# Patient Record
Sex: Male | Born: 1935 | Race: White | Hispanic: No | Marital: Single
Health system: Midwestern US, Community
[De-identification: ages and names within clinical notes are randomized; demographics above are authoritative.]

## PROBLEM LIST (undated history)

## (undated) DIAGNOSIS — G4730 Sleep apnea, unspecified: Secondary | ICD-10-CM

## (undated) DIAGNOSIS — M109 Gout, unspecified: Secondary | ICD-10-CM

## (undated) DIAGNOSIS — I10 Essential (primary) hypertension: Secondary | ICD-10-CM

## (undated) DIAGNOSIS — Z87891 Personal history of nicotine dependence: Secondary | ICD-10-CM

## (undated) DIAGNOSIS — S060X9A Concussion with loss of consciousness of unspecified duration, initial encounter: Secondary | ICD-10-CM

## (undated) DIAGNOSIS — F32A Depression, unspecified: Secondary | ICD-10-CM

## (undated) DIAGNOSIS — F329 Major depressive disorder, single episode, unspecified: Secondary | ICD-10-CM

## (undated) DIAGNOSIS — I2510 Atherosclerotic heart disease of native coronary artery without angina pectoris: Secondary | ICD-10-CM

## (undated) DIAGNOSIS — N400 Enlarged prostate without lower urinary tract symptoms: Secondary | ICD-10-CM

## (undated) DIAGNOSIS — H9190 Unspecified hearing loss, unspecified ear: Secondary | ICD-10-CM

## (undated) DIAGNOSIS — M419 Scoliosis, unspecified: Secondary | ICD-10-CM

## (undated) DIAGNOSIS — J449 Chronic obstructive pulmonary disease, unspecified: Secondary | ICD-10-CM

## (undated) DIAGNOSIS — I38 Endocarditis, valve unspecified: Secondary | ICD-10-CM

## (undated) DIAGNOSIS — M810 Age-related osteoporosis without current pathological fracture: Secondary | ICD-10-CM

## (undated) DIAGNOSIS — G459 Transient cerebral ischemic attack, unspecified: Secondary | ICD-10-CM

## (undated) DIAGNOSIS — M549 Dorsalgia, unspecified: Secondary | ICD-10-CM

## (undated) DIAGNOSIS — M4800 Spinal stenosis, site unspecified: Secondary | ICD-10-CM

## (undated) DIAGNOSIS — G8929 Other chronic pain: Secondary | ICD-10-CM

## (undated) DIAGNOSIS — W19XXXA Unspecified fall, initial encounter: Secondary | ICD-10-CM

## (undated) DIAGNOSIS — H3400 Transient retinal artery occlusion, unspecified eye: Secondary | ICD-10-CM

## (undated) DIAGNOSIS — J90 Pleural effusion, not elsewhere classified: Principal | ICD-10-CM

## (undated) DIAGNOSIS — C8330 Diffuse large B-cell lymphoma, unspecified site: Secondary | ICD-10-CM

## (undated) HISTORY — DX: Endocarditis, valve unspecified: I38

## (undated) HISTORY — DX: Age-related osteoporosis without current pathological fracture: M81.0

## (undated) HISTORY — DX: Unspecified hearing loss, unspecified ear: H91.90

## (undated) HISTORY — DX: Essential (primary) hypertension: I10

## (undated) HISTORY — DX: Sleep apnea, unspecified: G47.30

## (undated) HISTORY — DX: Gout, unspecified: M10.9

## (undated) HISTORY — PX: HERNIA REPAIR: SHX51

## (undated) HISTORY — DX: Transient cerebral ischemic attack, unspecified: G45.9

## (undated) HISTORY — DX: Atherosclerotic heart disease of native coronary artery without angina pectoris: I25.10

## (undated) HISTORY — DX: Spinal stenosis, site unspecified: M48.00

## (undated) HISTORY — DX: Chronic obstructive pulmonary disease, unspecified: J44.9

## (undated) HISTORY — DX: Benign prostatic hyperplasia without lower urinary tract symptoms: N40.0

## (undated) HISTORY — DX: Major depressive disorder, single episode, unspecified: F32.9

## (undated) HISTORY — DX: Concussion with loss of consciousness of unspecified duration, initial encounter: S06.0X9A

## (undated) HISTORY — DX: Depression, unspecified: F32.A

## (undated) HISTORY — DX: Unspecified fall, initial encounter: W19.XXXA

## (undated) HISTORY — DX: Scoliosis, unspecified: M41.9

## (undated) HISTORY — PX: REMOVE AND REPLACE LENS: SHX6308

## (undated) HISTORY — DX: Other chronic pain: G89.29

## (undated) HISTORY — DX: Personal history of nicotine dependence: Z87.891

## (undated) HISTORY — DX: Dorsalgia, unspecified: M54.9

## (undated) MED ORDER — CLOPIDOGREL 75 MG TAB
75 mg | ORAL_TABLET | Freq: Every day | ORAL | Status: DC
Start: ? — End: 2014-03-09

## (undated) MED ORDER — CLOPIDOGREL 75 MG TAB
75 mg | ORAL_TABLET | Freq: Every day | ORAL | Status: DC
Start: ? — End: 2012-05-09

---

## 2004-05-23 HISTORY — PX: PERCUTANEOUS CORONARY STENT INTERVENTION (PCI-S): SHX6016

## 2007-05-24 HISTORY — PX: OTHER SURGICAL HISTORY: SHX169

## 2007-05-24 HISTORY — PX: LAMINECTOMY: SHX219

## 2007-12-18 LAB — URINALYSIS W/ REFLEX CULTURE
Bacteria: NEGATIVE /HPF
Bilirubin: NEGATIVE
Glucose: NEGATIVE MG/DL
Ketone: NEGATIVE MG/DL
Nitrites: NEGATIVE
Protein: NEGATIVE MG/DL
Specific gravity: 1.005 (ref 1.003–1.030)
Urobilinogen: 0.2 EU/DL (ref 0.2–1.0)
pH (UA): 8 (ref 5.0–8.0)

## 2007-12-19 LAB — CULTURE, URINE
Colonies Counted: 80000
Colony Count: 80000

## 2011-05-24 DIAGNOSIS — G459 Transient cerebral ischemic attack, unspecified: Secondary | ICD-10-CM

## 2011-05-24 HISTORY — DX: Transient cerebral ischemic attack, unspecified: G45.9

## 2012-02-13 ENCOUNTER — Inpatient Hospital Stay
Admit: 2012-02-13 | Discharge: 2012-02-15 | Disposition: A | Discharge status: Home / Self Care | Payer: MEDICARE | Attending: Internal Medicine | Admitting: Internal Medicine

## 2012-02-13 DIAGNOSIS — G459 Transient cerebral ischemic attack, unspecified: Secondary | ICD-10-CM

## 2012-02-13 LAB — PROTHROMBIN TIME + INR
INR: 1 (ref 0.9–1.1)
Prothrombin time: 10.7 s (ref 9.4–11.7)

## 2012-02-13 LAB — METABOLIC PANEL, COMPREHENSIVE
A-G Ratio: 1.1 (ref 1.1–2.2)
ALT (SGPT): 37 U/L (ref 12–78)
AST (SGOT): 34 U/L (ref 15–37)
Albumin: 3.9 g/dL (ref 3.5–5.0)
Alk. phosphatase: 89 U/L (ref 50–136)
Anion gap: 8 mmol/L (ref 5–15)
BUN/Creatinine ratio: 18 (ref 12–20)
BUN: 18 MG/DL (ref 6–20)
Bilirubin, total: 0.4 MG/DL (ref 0.2–1.0)
CO2: 28 MMOL/L (ref 21–32)
Calcium: 9.4 MG/DL (ref 8.5–10.1)
Chloride: 106 MMOL/L (ref 97–108)
Creatinine: 1.02 MG/DL (ref 0.45–1.15)
GFR est AA: >60 mL/min/{1.73_m2} (ref >60)
GFR est non-AA: >60 mL/min/{1.73_m2} (ref >60)
Globulin: 3.7 g/dL (ref 2.0–4.0)
Glucose: 102 MG/DL — ABNORMAL HIGH (ref 65–100)
Potassium: 4.4 MMOL/L (ref 3.5–5.1)
Protein, total: 7.6 g/dL (ref 6.4–8.2)
Sodium: 142 MMOL/L (ref 136–145)

## 2012-02-13 LAB — CBC WITH AUTOMATED DIFF
ABS. BASOPHILS: 0.1 10*3/uL (ref 0.0–0.1)
ABS. EOSINOPHILS: 0.3 10*3/uL (ref 0.0–0.4)
ABS. LYMPHOCYTES: 2.2 10*3/uL
ABS. MONOCYTES: 0.6 10*3/uL (ref 0.0–1.0)
ABS. NEUTROPHILS: 3.6 10*3/uL (ref 1.8–8.0)
BASOPHILS: 1 % (ref 0–1)
EOSINOPHILS: 4 % (ref 0–7)
HCT: 34 % — ABNORMAL LOW (ref 36.6–50.3)
HGB: 11.8 g/dL — ABNORMAL LOW (ref 12.1–17.0)
LYMPHOCYTES: 33 % (ref 12–49)
MCH: 32.5 PG (ref 26.0–34.0)
MCHC: 34.7 g/dL (ref 30.0–36.5)
MCV: 93.7 FL (ref 80.0–99.0)
MONOCYTES: 9 % (ref 5–13)
NEUTROPHILS: 53 % (ref 32–75)
PLATELET: 119 10*3/uL — ABNORMAL LOW (ref 150–400)
RBC: 3.63 M/uL — ABNORMAL LOW (ref 4.10–5.70)
RDW: 13.9 % (ref 11.5–14.5)
WBC: 6.8 10*3/uL (ref 4.1–11.1)

## 2012-02-13 LAB — EKG, 12 LEAD, INITIAL
Atrial Rate: 52 {beats}/min
Calculated P Axis: 62 degrees
Calculated R Axis: 59 degrees
Calculated T Axis: 74 degrees
P-R Interval: 174 ms
Q-T Interval: 448 ms
QRS Duration: 104 ms
QTC Calculation (Bezet): 416 ms
Ventricular Rate: 52 {beats}/min

## 2012-02-13 LAB — PTT: aPTT: 25.7 s (ref 23.0–30.0)

## 2012-02-13 MED ADMIN — aspirin (ASPIRIN) tablet 325 mg: ORAL | @ 19:00:00 | NDC 00904200960

## 2012-02-13 MED ADMIN — sodium chloride (NS) flush 5-10 mL: INTRAVENOUS | @ 23:00:00 | NDC 87701099893

## 2012-02-13 MED ADMIN — metoprolol (LOPRESSOR) tablet 6.25 mg: ORAL | @ 23:00:00 | NDC 51079025501

## 2012-02-13 MED FILL — ASPIRIN 325 MG TAB: 325 mg | ORAL | Qty: 1

## 2012-02-13 MED FILL — METOPROLOL TARTRATE 25 MG TAB: 25 mg | ORAL | Qty: 1

## 2012-02-13 NOTE — ED Provider Notes (Signed)
Progress note-patient was seen by mid level and examined by myself. Patient does have a symptoms consistent with amaurosis fugax. His vision has improved but still does have some blurring of his vision. He has a prior history of hypertension and dyslipidemia as well as heart disease. He states he has a history of a leaky valve but no prior heart surgery. He's been to the [place] in the past with Dopplers over 6-7 years ago of his carotid arteries that were normal. He also had an echocardiogram at that time that was normal. Based on his symptoms and risk factors as recommended by on-call neurology Dr. Belia Heman to be admitted to the hospital for for TIA evaluation. Dr. Montez Morita the hospitalist at [facility] has accepted the patient for transfer there. The patient agrees with this plan and transferred to [facility]. [facility].

## 2012-02-13 NOTE — ED Notes (Signed)
Pt to CT via stretcher accompanied by tech.

## 2012-02-13 NOTE — ED Notes (Signed)
Patient being transferred to  room number 336 [facility] . Patient transferred with Medical Transport. Time out completed with Medical Transport confirming correct patient, facility, and room number.

## 2012-02-13 NOTE — Consults (Signed)
Name:       Paul Medina, Paul Medina          Admitted:          02/13/2012                                         DOB:               [birth year]  Account #:  [id number]               Age:               78  Consultant: Meredith Leeds,  Location              MD                                CONSULTATION REPORT    DATE OF CONSULTATION           02/13/2012      HISTORY OF PRESENT ILLNESS: I was asked to see the patient at the [facility] in lieu of what was going to be a transfer to [facility] for a  medical workup for left amaurosis fugax.    The patient is a very pleasant 78 year old right-handed white male. He is a  veteran in good standing with [facility] [employer] where he  gets his care, followed at [facility]. 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 Earlier today around  9:30, he said was reading and happened to independently close each eye. He  discovered that his left eye vision was grayed out completely. His right  eye was baseline normal. He had the peace of mind to call his eye doctor,  Dr. Dorna Bloom, and have his vision checked out as this had never occurred before.  He saw Dr. Dorna Bloom around 11 o'clock and by that time he said his vision was  improving rather strongly. Interestingly, he may have noticed this while  his eyes were dilated. At the time, he had no complaints of headache or eye  pain and there was no report at any time of double vision or focal  neurological deficits such as weakness, numbness, balance issues, change of  mentation, involuntary movements, etc. He was referred from Dr. Haskell Riling office  apparently on to the [facility] ER. I do not have any of the notes from  Dr. Haskell Riling encounter regarding positives or pertinent negatives. I have  suspicion that the diagnosis was amaurosis fugax.    PAST MEDICAL HISTORY  1. The patient takes background aspirin 81 mg a day.  2.  Lopressor.  3. Zocor.  4. Hytrin.  5. Valsartan.    ALLERGIES INCLUDE  1. SULFA.  2. DOXYCYCLINE.  3. DARVOCET.    His head CT at the [facility] was reportedly normal.    He is an occasional imbiber of alcohol, but he quit smoking some 10 or 15  years ago. His blood work at the [facility] showed a normal CMP with a  random glucose of 102. CBC normal except for slightly depressed hemoglobin  and hematocrit of 11.8 and 34 respectively, platelets slightly depressed at  119,000.    He says that he had a remote carotid Doppler done at the [place]  for uncertain  reasons that was reportedly unrevealing.    REVIEW OF SYSTEMS: Per history of present illness.    FAMILY HISTORY: Positive for hypertension, organic heart disease,  cholesterol and diabetes.    PHYSICAL EXAMINATION  GENERAL: His current general physical exam, he is alert, he is cooperative,  he is fluent, articulate, behaving appropriately, following 3-step  commands, oriented x4.  VITAL SIGNS: Temperature 97.8, pulse 56, slightly depressed, blood pressure  167/71, respirations 16 and unlabored, pulse oximetry 99% on room air.  HEENT: Normocephalic, atraumatic, without bruits. Ears, nose and throat  were clear.  NECK: Reasonably supple, no lymphadenopathy, equal carotid upstroke,  nontender, no bruits. Range of motion complete.  CHEST: Clear. No rales or wheeze.  CARDIOVASCULAR: Currently regular rate and rhythm without gallops, murmurs,  or rubs. Pulses 2+ and full.  ABDOMEN: Flat, nontender. Active bowel sounds.  EXTREMITIES: Full range of motion. No cyanosis, clubbing, edema, rash, or  birthmarks. No involuntary movements seen.  NEUROLOGIC: Cranial nerves 2-12 with funduscopic normal under nondilated  conditions. Pupils were slightly anisocoric, the right pupil was about 4 mm  and the left pupil was about 2.5, again afferent pupillary defect negative.  Extraocular motility intact without nystagmus. Lid fissures symmetrically  apposed. External appearance  normal. Visual acuity near in the emergency  room with a near lens was 20/25 in the right eye and 20/20 in the left eye.  Visual fields to confrontation grossly full. Facial sensibility and  expression intact. Oral exam unrevealing. Normal motor mechanics of tongue  and palate.  CEREBELLAR TESTING: Finger-nose-finger, toe-to-finger, and heel-to-shin  intact. Motor 5/5. Sensory intact to touch, temperature and vibratory.  Reflexes were +2. The toes were downgoing, no clonus, no Hoffmann. Gait,  etc, deferred.    IMPRESSION: Left amaurosis fugax.    At this point, agree with admission, need to look at his vascular risk  factors. If he has any issues, the most likely source of emboli to the left  retinal circulation would be the ipsilateral carotid artery, less likely  the heart and less likely the aortic arch. Would ask him to continue on the  aspirin and his treatment of the blood pressure and cholesterol. Suggest  checking a sedimentation rate out of principle, although I do not think  this is temporal arteritis. Recommend carotid Doppler and a 2D echo has  already been ordered. He has sequential compression devices in place. He  has O2 flowing at 2 L/minute. Will also suggest neuro checks. While he is  here, would recommend an MRA of the intracranial circulation. In the big  scheme of things, I also do not think this is nonarteritic ischemic optic  neuropathy, for what it is worth. No current detail by history or exam for carotid artery dissection. No logical migraine story here either. Optic neuritis doesn't behave this way. Dr. Dorna Bloom apparently ruled out intrinsic eye concerns such as retinal detachment etc... Fortunately, he was able to make a concise history of monocular involvement instead of the question of whether it was binocular, so this definitely focuses and fuses our attention on the  anterior visual pathway, especially the carotid and retinal circulations.  Keep vascular risk factors under best control  and will make any further  recommendations if the testing suggest a different lead.        Reviewed on 02/14/2012 5:07 AM                Meredith Leeds, MD  cc:                       Meredith Leeds, MD      JJH/wmx; D: 02/13/2012 06:50 P; T: 02/13/2012 08:32 P; DOC# [id number]; Job#  [id number]

## 2012-02-13 NOTE — ED Notes (Signed)
Dr Peri Maris in with pt now.

## 2012-02-13 NOTE — Progress Notes (Signed)
Bedside and Verbal shift change report given to [facility] RN (oncoming nurse) by Ellery Plunk RN (offgoing nurse).  Report given with SBAR, Kardex, Intake/Output, MAR and Recent Results.

## 2012-02-13 NOTE — ED Notes (Signed)
The pt's  state that he is not quite sure what he is allergic to some type of pain medication and the meds listed as  he is not sure exactly what the reactions are,  He states that he has never had an anaphylactic reaction, just nausea and vomiting.

## 2012-02-13 NOTE — H&P (Signed)
[facility]  [address] [place], [place]  [address]  [id number]    Admission History and Physical      NAME:  Paul Medina   DOB:   1930-08-29   MRN:  [id number]     PCP:  Robinette Haines, MD     Date/Time:  02/13/2012         Subjective:     CHIEF COMPLAINT: L vision loss    HISTORY OF PRESENT ILLNESS:     [name] is a 78 y.o.  male who is admitted with TIA.  Mr. Mccarville presented to the Emergency Department today complaining of losing 98% of the vision in his L eye.  He stated that he could only see shadows with the L eye but the R eye remained normal.  At this time his vision has almost completely returned in the L eye.  He denies any HA or jaw pain.  He denies slurred speech, facial droop or unilateral weakness.      Past Medical History   Diagnosis Date   ??? Hypertension    ??? Hypercholesterolemia    ??? Carpal tunnel syndrome         Past Surgical History   Procedure Date   ??? Hx coronary stent placement    ??? Hx orthopaedic      back       History   Substance Use Topics   ??? Smoking status: Former Smoker -- 1.0 packs/day for 30 years   ??? Smokeless tobacco: Former Neurosurgeon     Quit date: 02/12/2001   ??? Alcohol Use: Yes      rare        Family History   Problem Relation Age of Onset   ??? Heart Attack Father    ??? Heart Attack Mother    ??? Cancer Mother    ??? Cancer Sister    ??? Stroke Brother         Allergies   Allergen Reactions   ??? Darvocet A500 (Propoxyphene N-Acetaminophen) Other (comments)     unknown   ??? Doxycycline Nausea and Vomiting   ??? Sulfa (Sulfonamide Antibiotics) Nausea and Vomiting        Prior to Admission medications    Medication Sig Start Date End Date Taking? Authorizing Provider   simvastatin (ZOCOR) 40 mg tablet Take  by mouth nightly.   Yes Phys Other, MD   valsartan-hydrochlorothiazide (DIOVAN-HCT) 160-25 mg per tablet Take 1 Tab by mouth daily.   Yes Phys Other, MD   terazosin (HYTRIN) 10 mg capsule Take 10 mg by mouth nightly.   Yes Phys Other, MD    aspirin 81 mg chewable tablet Take 81 mg by mouth nightly.   Yes Phys Other, MD   metoprolol (LOPRESSOR) 25 mg tablet Take 6.25 mg by mouth two (2) times a day.   Yes Historical Provider   Omega-3-DHA-EPA-Fish Oil 1,000 (120-180) mg cap Take 1,200 mg by mouth.   Yes Historical Provider   ascorbic acid (VITAMIN C) 500 mg tablet Take 500 mg by mouth.   Yes Historical Provider         Review of Systems:  (bold if positive, if negative)    Gen:  Eyes:  Visual changes, vision loss in L eye which has almost resolved,ENT:  CVS:  Pulm:  GI:    GU:    MS:  Skin:  Psych:  Endo:    Hem:  Renal:  Neuro:            Objective:      VITALS:    Vital signs reviewed; most recent are:    Visit Vitals   Item Reading   ??? BP 167/71   ??? Pulse 56   ??? Temp 97.8 ??F (36.6 ??C)   ??? Resp 16   ??? SpO2 99%     SpO2 Readings from Last 6 Encounters:   02/13/12 99%        No intake or output data in the 24 hours ending 02/13/12 2004         Exam:     Physical Exam:    Gen:  Well-developed, well-nourished, in no acute distress  HEENT:  Pink conjunctivae, PERRL, hearing intact to voice, moist mucous membranes  Neck:  Supple, without masses, thyroid non-tender  Resp:  No accessory muscle use, clear breath sounds without wheezes rales or rhonchi  Card:  No murmurs, normal S1, S2 without thrills, bruits or peripheral edema  Abd:  Soft, non-tender, non-distended, normoactive bowel sounds are present, no palpable organomegaly  Lymph:  No cervical adenopathy  Musc:  No cyanosis or clubbing  Skin:  No rashes or ulcers, skin turgor is good  Neuro:  Cranial nerves 3-12 are intact, grip strength is 5/5 bilaterally, dorsi / plantarflexion strength is 5/5 bilaterally, follows commands appropriately  Psych:  Alert with good insight.  Oriented to person, place, and time       Labs:    Recent Labs   [facility] 02/13/12 1345    WBC 6.8    HGB 11.8*    HCT 34.0*    PLT 119*     Recent Labs   Basename 02/13/12 1345    NA 142    K 4.4    CL 106    CO2 28    GLU 102*     BUN 18    CREA 1.02    CA 9.4    MG --    PHOS --    ALB 3.9    TBIL 0.4    SGOT 34     No results found for this basename: glpoc     No results found for this basename: PH:1,PCO2:1,PO2:1,HCO3:1,FIO2:1 in the last 72 hours  Recent Labs   [facility] 02/13/12 1345    INR 1.0       EKG reviewed:   Sinus bradycardia  CT head:IMPRESSION:  NORMAL STUDY.          Assessment/Plan:       Principal Problem:   *TIA (transient ischemic attack) (02/13/2012): not likely temporal arteritis as pt has no HA or jaw pain.  Pt with multiple risk factors for stroke:  HTN, HLD, family history (brother)  -Head CT neg   -MRI and dopplers pending  -Echo in AM  Neurology consulted      Active Problems:     HTN (hypertension) (02/13/2012)  Pt is currently taking a very small dose of BB and is still bradycardic.  Will DC metoprolol.  Cont Diovan HCT  Unsure if pt has had CVA, so will permit some elevated BP until MRI rules this out.    Will use Hydralazine IV prn for SBP>220/110     HLD (hyperlipidemia) (02/13/2012): cont statin     Coronary atherosclerosis of native coronary artery (02/13/2012): cont ASA  -hold BB for bradycardia  -cont statin     OSA (obstructive sleep apnea) (02/13/2012)  Surrogate decision maker: Paul, Medina    Total time spent with patient: 58 Minutes                  Care Plan discussed with: Patient and Nursing Staff    Discussed:  Care Plan and D/C Planning    Prophylaxis:  Lovenox    Probable Disposition:  Home w/Family           ___________________________________________________    Attending Physician: Elyse Jarvis, DO

## 2012-02-13 NOTE — Progress Notes (Signed)
TRANSFER - IN REPORT:    Verbal report received from Tempie Donning RN(name) on Khalel Eliopoulos  being received from [place] center(unit) for routine progression of care      Report consisted of patient???s Situation, Background, Assessment and   Recommendations(SBAR).     Information from the following report(s) SBAR, Kardex, Procedure Summary, Intake/Output, MAR and Recent Results was reviewed with the receiving nurse.    Opportunity for questions and clarification was provided.      Assessment completed upon patient???s arrival to unit and care assumed.

## 2012-02-13 NOTE — Consults (Signed)
Please see full H&P

## 2012-02-13 NOTE — ED Provider Notes (Signed)
HPI Comments: 9:30 am notes L sided blindness, sudden, no pain associated.  No problems speaking or any focal weakness.  He drove himself to his opthomologist (Dr. Dorna Bloom) who evaluated him and then sent him to the ED as he thought that this was a "minny stroke".  His vision is now improving.      Patient denies prior symptoms, he is otherwise well.  He notes that his vision is nearly equal now but remains a bit "fuzzy".         Patient is a 78 y.o. male presenting with TIA.   TIA  Pertinent negatives include no speech difficulty. Pertinent negatives include no headaches.        Past Medical History   Diagnosis Date   ??? Hypertension    ??? Hypercholesterolemia    ??? Carpal tunnel syndrome         Past Surgical History   Procedure Date   ??? Hx coronary stent placement    ??? Hx orthopaedic      back         History reviewed. No pertinent family history.     History     Social History   ??? Marital Status: SINGLE     Spouse Name: N/A     Number of Children: N/A   ??? Years of Education: N/A     Occupational History   ??? Not on file.     Social History Main Topics   ??? Smoking status: Current Some Day Smoker   ??? Smokeless tobacco: Not on file   ??? Alcohol Use: Yes   ??? Drug Use:    ??? Sexually Active:      Other Topics Concern   ??? Not on file     Social History Narrative   ??? No narrative on file                  ALLERGIES: Darvocet a500; Doxycycline; and Sulfa (sulfonamide antibiotics)      Review of Systems   Constitutional: Negative for fever and chills.   Eyes: Positive for visual disturbance. Negative for pain and redness.   Respiratory: Negative.    Cardiovascular: Negative.    Gastrointestinal: Negative.    Neurological: Negative for dizziness, facial asymmetry, speech difficulty, weakness and headaches.   All other systems reviewed and are negative.        There were no vitals filed for this visit.         Physical Exam   Nursing note and vitals reviewed.  Constitutional: He is oriented to person, place, and time. He appears  well-developed and well-nourished.   HENT:   Head: Normocephalic and atraumatic.   Eyes: EOM are normal.   Fundoscopic exam:       The right eye shows no papilledema. The right eye shows red reflex.       The left eye shows no papilledema. The left eye shows red reflex.       Bilateral pupils dilated equal bilateral 5mm following dilation in opthomology office.   Neck: Normal range of motion.   Cardiovascular: Normal rate, regular rhythm and normal heart sounds.    Pulmonary/Chest: Effort normal and breath sounds normal.   Abdominal: Soft. There is no tenderness.   Musculoskeletal:        5/5 strength bilateral UE/LE       Neurological: He is alert and oriented to person, place, and time. He has normal strength and normal reflexes. No sensory deficit.  He displays a negative Romberg sign. Coordination normal.        No pronator drift         MDM     Differential Diagnosis; Clinical Impression; Plan:     78 y/o M with symptoms suggestive amaurosis fugax, vision now improving.  Discussed case with neurology Belia Heman), recommended admission for workup.  VA 20/40 ou.  No focal weakness or speech disturbance.  During course in ED his vision returned to baseline.      ASA 325mg  po.        Amount and/or Complexity of Data Reviewed:   Clinical lab tests:  Ordered and reviewed  Tests in the radiology section of CPT??:  Ordered and reviewed   Obtain history from someone other than the patient:  Yes   Discuss the patient with another provider:  Yes   Independant visualization of image, tracing, or specimen:  Yes      Procedures

## 2012-02-13 NOTE — Other (Signed)
TRANSFER - OUT REPORT:    Verbal report given to [name] RN(name) on Paul Medina  being transferred to [facility] 336(unit) for routine progression of care       Report consisted of patient???s Situation, Background, Assessment and   Recommendations(SBAR).     Information from the following report(s) SBAR was reviewed with the receiving nurse.    Opportunity for questions and clarification was provided.

## 2012-02-13 NOTE — ED Notes (Signed)
Pt states that this am he had sudden onset of grayed vision in the left eye,  Sent by Dr. Dorna Bloom from [facility] for evaluation of possible TIA.  The pt's states that his vision has recovered in the last 30-45 minutes,  The eye was dilated at [facility].

## 2012-02-13 NOTE — Progress Notes (Signed)
Stroke Education provided to patient and the following topics were discussed    1. Patients personal risk factors for stroke are hypertension, family history and sleep apnea screen    2. Warning signs of Stroke:        * Sudden numbness or weakness of the face, arm or leg, especially on one side of          The body            * Sudden confusion, trouble speaking or understanding        * Sudden trouble seeing in one or both eyes        * Sudden trouble walking, dizziness, loss of balance or coordination        * Sudden severe headache with no known cause      3. Importance of activation Emergency Medical Services ( 9-1-1 ) immediately if experience any warning signs of stroke.    4. Be sure and schedule a follow-up appointment with your primary care doctor or any specialists as instructed.     5. You must take medicine every day to treat your risk factors for stroke.  Be sure to take your medicines exactly as your doctor tells you: no more, no less.  Know what your medicines are for , what they do.  Anti-thrombotics /anticoagulants can help prevent strokes.  You are taking the following medicine(s)  Aspirin      6.  Smoking and second-hand smoke greatly increase your risk of stroke, cardiovascular disease and death. Smoking history ended year about 2003     7. Information provided was [employer] banker, operational Education    8. Documentation of teaching completed in Patient Education Activity and on Care Plan with teaching response noted?  yes

## 2012-02-14 LAB — LIPID PANEL
CHOL/HDL Ratio: 2.1 (ref 0–5.0)
Cholesterol, total: 108 MG/DL (ref <200)
HDL Cholesterol: 51 MG/DL
LDL, calculated: 47.8 MG/DL (ref 0–100)
Triglyceride: 46 MG/DL (ref <150)
VLDL, calculated: 9.2 MG/DL

## 2012-02-14 LAB — CBC WITH AUTOMATED DIFF
ABS. BASOPHILS: 0 10*3/uL (ref 0.0–0.1)
ABS. EOSINOPHILS: 0.3 10*3/uL (ref 0.0–0.4)
ABS. LYMPHOCYTES: 2 10*3/uL (ref 0.8–3.5)
ABS. MONOCYTES: 0.4 10*3/uL (ref 0.0–1.0)
ABS. NEUTROPHILS: 3 10*3/uL (ref 1.8–8.0)
BASOPHILS: 1 % (ref 0–1)
EOSINOPHILS: 4 % (ref 0–7)
HCT: 29.9 % — ABNORMAL LOW (ref 36.6–50.3)
HGB: 10.7 g/dL — ABNORMAL LOW (ref 12.1–17.0)
LYMPHOCYTES: 36 % (ref 12–49)
MCH: 32.8 PG (ref 26.0–34.0)
MCHC: 35.8 g/dL (ref 30.0–36.5)
MCV: 91.7 FL (ref 80.0–99.0)
MONOCYTES: 7 % (ref 5–13)
NEUTROPHILS: 52 % (ref 32–75)
PLATELET: 106 10*3/uL — ABNORMAL LOW (ref 150–400)
RBC: 3.26 M/uL — ABNORMAL LOW (ref 4.10–5.70)
RDW: 14.3 % (ref 11.5–14.5)
WBC: 5.8 10*3/uL (ref 4.1–11.1)

## 2012-02-14 LAB — METABOLIC PANEL, COMPREHENSIVE
A-G Ratio: 1 — ABNORMAL LOW (ref 1.1–2.2)
ALT (SGPT): 23 U/L (ref 12–78)
AST (SGOT): 24 U/L (ref 15–37)
Albumin: 3 g/dL — ABNORMAL LOW (ref 3.5–5.0)
Alk. phosphatase: 74 U/L (ref 50–136)
Anion gap: 9 mmol/L (ref 5–15)
BUN/Creatinine ratio: 17 (ref 12–20)
BUN: 19 MG/DL (ref 6–20)
Bilirubin, total: 0.3 MG/DL (ref 0.2–1.0)
CO2: 29 MMOL/L (ref 21–32)
Calcium: 9 MG/DL (ref 8.5–10.1)
Chloride: 109 MMOL/L — ABNORMAL HIGH (ref 97–108)
Creatinine: 1.13 MG/DL (ref 0.45–1.15)
GFR est AA: >60 mL/min/{1.73_m2} (ref >60)
GFR est non-AA: >60 mL/min/{1.73_m2} (ref >60)
Globulin: 2.9 g/dL (ref 2.0–4.0)
Glucose: 117 MG/DL — ABNORMAL HIGH (ref 65–100)
Potassium: 4.3 MMOL/L (ref 3.5–5.1)
Protein, total: 5.9 g/dL — ABNORMAL LOW (ref 6.4–8.2)
Sodium: 147 MMOL/L — ABNORMAL HIGH (ref 136–145)

## 2012-02-14 LAB — SED RATE (ESR): Sed rate (ESR): 1 MM/HR (ref 0–20)

## 2012-02-14 MED ADMIN — enoxaparin (LOVENOX) injection 40 mg: SUBCUTANEOUS | @ 18:00:00 | NDC 00075062040

## 2012-02-14 MED ADMIN — psyllium (METAMUCIL) packet 1 Packet: ORAL | @ 18:00:00 | NDC 37000002410

## 2012-02-14 MED ADMIN — ascorbic acid (VITAMIN C) tablet 500 mg: ORAL | @ 15:00:00 | NDC 00409339732

## 2012-02-14 MED ADMIN — simvastatin (ZOCOR) tablet 40 mg: ORAL | @ 01:00:00 | NDC 68382006716

## 2012-02-14 MED ADMIN — sodium chloride (NS) flush 5-10 mL: INTRAVENOUS | @ 18:00:00 | NDC 87701099893

## 2012-02-14 MED ADMIN — gadopentetate dimeglumine (MAGNEVIST) 7.5 mmol/15 mL (469.01 mg/mL) contrast solution 15 mL: INTRAVENOUS | @ 02:00:00 | NDC 50419018815

## 2012-02-14 MED ADMIN — sodium chloride (NS) flush 5-10 mL: INTRAVENOUS | @ 01:00:00 | NDC 87701099893

## 2012-02-14 MED ADMIN — sodium chloride (NS) flush 5-10 mL: INTRAVENOUS | @ 10:00:00 | NDC 87701099893

## 2012-02-14 MED ADMIN — terazosin (HYTRIN) capsule 10 mg: ORAL | @ 01:00:00 | NDC 00093433801

## 2012-02-14 MED ADMIN — aspirin chewable tablet 81 mg: ORAL | @ 01:00:00 | NDC 63739043401

## 2012-02-14 MED ADMIN — fish oil-omega-3 fatty acids 340-1,000 mg capsule 1 Cap: ORAL | @ 18:00:00 | NDC 53191040901

## 2012-02-14 MED FILL — PSYLLIUM ORAL PACKET SUGAR FREE: ORAL | Qty: 1

## 2012-02-14 MED FILL — ASPIRIN 81 MG CHEWABLE TAB: 81 mg | ORAL | Qty: 1

## 2012-02-14 MED FILL — MAGNEVIST 7.5 MMOL/15 ML (469.01 MG/ML) INTRAVENOUS SOLUTION: 7.5 mmol/15 mL (469.01 mg/mL) | INTRAVENOUS | Qty: 15

## 2012-02-14 MED FILL — VITAMIN C 500 MG TABLET: 500 mg | ORAL | Qty: 1

## 2012-02-14 MED FILL — SIMVASTATIN 20 MG TAB: 20 mg | ORAL | Qty: 2

## 2012-02-14 MED FILL — TERAZOSIN 5 MG CAP: 5 mg | ORAL | Qty: 2

## 2012-02-14 MED FILL — LOVENOX 40 MG/0.4 ML SUBCUTANEOUS SYRINGE: 40 mg/0.4 mL | SUBCUTANEOUS | Qty: 0.4

## 2012-02-14 MED FILL — FISH OIL 340 MG-1,000 MG CAPSULE: 340-1000 mg | ORAL | Qty: 1

## 2012-02-14 NOTE — Procedures (Signed)
[facility]  *** FINAL REPORT ***    Name: Paul Medina, Paul Medina  MRN: [id number]  DOB: 15-Dec-1930  Visit: [id number]  Date: 13 Feb 2012    TYPE OF TEST: Cerebrovascular Duplex    REASON FOR TEST  Visual field defect, unspecifd, Left Eye    Right Carotid:-             Proximal               Mid                 Distal  cm/s  Systolic  Diastolic  Systolic  Diastolic  Systolic  Diastolic  CCA:    114.0      14.0                            96.8      20.3  Bulb:  ECA:     93.7       6.5  ICA:     60.1      15.3       73.2      17.4       85.2      27.3  ICA/CCA:  0.6       0.8    ICA Stenosis:    Right Vertebral:-  Finding: Antegrade  Sys:       57.0  Dia:       14.0    Right Subclavian:    Left Carotid:-            Proximal                Mid                 Distal  cm/s  Systolic  Diastolic  Systolic  Diastolic  Systolic  Diastolic  CCA:     95.6      12.5                            89.9      17.4  Bulb:  ECA:     98.6       5.0  ICA:     94.9      13.7       59.9      23.7       82.4      29.9  ICA/CCA:  1.1       0.8    ICA Stenosis:    Left Vertebral:-  Finding: Antegrade  Sys:       53.7  Dia:       16.2    Left Subclavian:    INTERPRETATION/FINDINGS  PROCEDURE:  Evaluation of the extracranial cerebrovascular arteries  with ultrasound (B-mode imaging, pulsed Doppler, color Doppler).  Includes the common carotid, internal carotid, external carotid, and  vertebral arteries.    FINDINGS: Calcific plaque in the bulb and right ICA. Homogeneous  plaque in the bulb and left ICA.    IMPRESSION: Findings are consistent with 0-49% stenosis of the right  internal carotid and 0-49% stenosis of the left internal carotid.  Vertebrals are patent with antegrade flow.    ADDITIONAL COMMENTS    I have personally reviewed the data relevant to the interpretation of  this  study.    TECHNOLOGIST:  Valentina Lucks    PHYSICIAN:  Electronically signed by:  Dalene Carrow. Sheliah Hatch, MD  02/15/2012 11:53 AM

## 2012-02-14 NOTE — Progress Notes (Signed)
Bedside and Verbal shift change report given to Kristle RN (oncoming nurse) by Monica RN (offgoing nurse).  Report given with SBAR, Kardex and MAR.

## 2012-02-14 NOTE — Med Student Progress Note (Signed)
*ATTENTION:  This note has been created by a medical student for educational purposes only.  Please do not refer to the content of this note for clinical decision-making, billing, or other purposes.  Please see attending physician???s note to obtain clinical information on this patient.*       CC:  " i feel fine now"      Interval hx:  Pt with transient vision loss admitted for TIA. No events overnight      Subjective:    Pt reporting near complete resolution of L eye vision loss, denies weakness, gait instability or HAs    Current Facility-Administered Medications   Medication Dose Route Frequency   ??? dextrose 5 % - 0.45% NaCl infusion  75 mL/hr IntraVENous CONTINUOUS   ??? aspirin (ASPIRIN) tablet 325 mg  325 mg Oral ONCE   ??? aspirin chewable tablet 81 mg  81 mg Oral QHS   ??? simvastatin (ZOCOR) tablet 40 mg  40 mg Oral QHS   ??? terazosin (HYTRIN) capsule 10 mg  10 mg Oral QHS   ??? valsartan/hydrochlorothiazide (DIOVAN HCT) 160/25 mg   Oral DAILY   ??? ascorbic acid (VITAMIN C) tablet 500 mg  500 mg Oral DAILY WITH BREAKFAST   ??? .PHARMACY TO SUBSTITUTE PER PROTOCOL       ??? sodium chloride (NS) flush 5-10 mL  5-10 mL IntraVENous Q8H   ??? sodium chloride (NS) flush 5-10 mL  5-10 mL IntraVENous PRN   ??? hydrALAZINE (APRESOLINE) 20 mg/mL injection 20 mg  20 mg IntraVENous Q6H PRN   ??? gadopentetate dimeglumine (MAGNEVIST) 7.5 mmol/15 mL (469.01 mg/mL) contrast solution 15 mL  15 mL IntraVENous RAD ONCE         BP 144/50   Pulse 60   Temp 98.4 ??F (36.9 ??C)   Resp 18   SpO2 96%    Physical Examination: General appearance - alert, well appearing, and in no distress and oriented to person, place, and time  Mental status - normal mood, behavior, speech, dress, motor activity, and thought processes  Eyes - pupils equal and reactive, extraocular eye movements intact, left eye normal, right eye normal  Neck - supple, no significant adenopathy, carotids upstroke normal bilaterally, no bruits  Chest - clear to auscultation, no wheezes,  rales or rhonchi, symmetric air entry, no tachypnea, retractions or cyanosis  Heart - normal rate, regular rhythm, normal S1, S2, systolic murmur heard LUSB and 5th intercostal midclavicular  Abdomen - soft, nontender, nondistended, no masses or organomegaly  Neurological - alert, oriented, normal speech, no focal findings or movement disorder noted, neck supple without rigidity, cranial nerves II through XII intact, motor and sensory grossly normal bilaterally, normal muscle tone, no tremors, strength 5/5      Recent Results (from the past 24 hour(s))   EKG, 12 LEAD, INITIAL    Collection Time    02/13/12  1:36 PM       Component Value Range    Ventricular Rate 52      Atrial Rate 52      P-R Interval 174      QRS Duration 104      Q-T Interval 448      QTC Calculation (Bezet) 416      Calculated P Axis 62      Calculated R Axis 59      Calculated T Axis 74      Diagnosis        Value: Sinus bradycardia  Otherwise normal ECG      No previous ECGs available      Confirmed by [id number], [place] MD, Loraine Leriche ([id number]) on 02/13/2012 6:33:26 PM   CBC WITH AUTOMATED DIFF    Collection Time    02/13/12  1:45 PM       Component Value Range    WBC 6.8  4.1 - 11.1 K/uL    RBC 3.63 (*) 4.10 - 5.70 M/uL    HGB 11.8 (*) 12.1 - 17.0 g/dL    HCT 98.1 (*) 19.1 - 50.3 %    MCV 93.7  80.0 - 99.0 FL    MCH 32.5  26.0 - 34.0 PG    MCHC 34.7  30.0 - 36.5 g/dL    RDW 47.8  29.5 - 62.1 %    PLATELET 119 (*) 150 - 400 K/uL    NEUTROPHILS 53  32 - 75 %    LYMPHOCYTES 33  12 - 49 %    MONOCYTES 9  5 - 13 %    EOSINOPHILS 4  0 - 7 %    BASOPHILS 1  0 - 1 %    ABS. NEUTROPHILS 3.6  1.8 - 8.0 K/UL    ABS. LYMPHOCYTES 2.2      ABS. MONOCYTES 0.6  0.0 - 1.0 K/UL    ABS. EOSINOPHILS 0.3  0.0 - 0.4 K/UL    ABS. BASOPHILS 0.1  0.0 - 0.1 K/UL    DF AUTOMATED     METABOLIC PANEL, COMPREHENSIVE    Collection Time    02/13/12  1:45 PM       Component Value Range    Sodium 142  136 - 145 MMOL/L    Potassium 4.4  3.5 - 5.1 MMOL/L    Chloride 106  97 - 108  MMOL/L    CO2 28  21 - 32 MMOL/L    Anion gap 8  5 - 15 mmol/L    Glucose 102 (*) 65 - 100 MG/DL    BUN 18  6 - 20 MG/DL    Creatinine 3.08  6.57 - 1.15 MG/DL    BUN/Creatinine ratio 18  12 - 20      GFR est AA >60  >60 ml/min/1.47m2    GFR est non-AA >60  >60 ml/min/1.69m2    Calcium 9.4  8.5 - 10.1 MG/DL    Bilirubin, total 0.4  0.2 - 1.0 MG/DL    ALT 37  12 - 78 U/L    AST 34  15 - 37 U/L    Alk. phosphatase 89  50 - 136 U/L    Protein, total 7.6  6.4 - 8.2 g/dL    Albumin 3.9  3.5 - 5.0 g/dL    Globulin 3.7  2.0 - 4.0 g/dL    A-G Ratio 1.1  1.1 - 2.2     PTT    Collection Time    02/13/12  1:45 PM       Component Value Range    aPTT 25.7  23.0 - 30.0 sec    aPTT, therapeutic range      58.0 - 77.0 SECS   PROTHROMBIN TIME    Collection Time    02/13/12  1:45 PM       Component Value Range    INR 1.0  0.9 - 1.1      Prothrombin time 10.7  9.4 - 11.7 sec   LIPID PANEL    Collection Time    02/13/12  6:50  PM       Component Value Range    LIPID PROFILE          Cholesterol, total 108  <200 MG/DL    Triglyceride 46  <161 MG/DL    HDL Cholesterol 51      LDL, calculated 47.8  0 - 096 MG/DL    VLDL, calculated 9.2      CHOL/HDL Ratio 2.1  0 - 5.0     METABOLIC PANEL, COMPREHENSIVE    Collection Time    02/14/12  2:50 AM       Component Value Range    Sodium 147 (*) 136 - 145 MMOL/L    Potassium 4.3  3.5 - 5.1 MMOL/L    Chloride 109 (*) 97 - 108 MMOL/L    CO2 29  21 - 32 MMOL/L    Anion gap 9  5 - 15 mmol/L    Glucose 117 (*) 65 - 100 MG/DL    BUN 19  6 - 20 MG/DL    Creatinine 0.45  4.09 - 1.15 MG/DL    BUN/Creatinine ratio 17  12 - 20      GFR est AA >60  >60 ml/min/1.34m2    GFR est non-AA >60  >60 ml/min/1.5m2    Calcium 9.0  8.5 - 10.1 MG/DL    Bilirubin, total 0.3  0.2 - 1.0 MG/DL    ALT 23  12 - 78 U/L    AST 24  15 - 37 U/L    Alk. phosphatase 74  50 - 136 U/L    Protein, total 5.9 (*) 6.4 - 8.2 g/dL    Albumin 3.0 (*) 3.5 - 5.0 g/dL    Globulin 2.9  2.0 - 4.0 g/dL    A-G Ratio 1.0 (*) 1.1 - 2.2     CBC WITH  AUTOMATED DIFF    Collection Time    02/14/12  2:50 AM       Component Value Range    WBC 5.8  4.1 - 11.1 K/uL    RBC 3.26 (*) 4.10 - 5.70 M/uL    HGB 10.7 (*) 12.1 - 17.0 g/dL    HCT 81.1 (*) 91.4 - 50.3 %    MCV 91.7  80.0 - 99.0 FL    MCH 32.8  26.0 - 34.0 PG    MCHC 35.8  30.0 - 36.5 g/dL    RDW 78.2  95.6 - 21.3 %    PLATELET 106 (*) 150 - 400 K/uL    NEUTROPHILS 52  32 - 75 %    LYMPHOCYTES 36  12 - 49 %    MONOCYTES 7  5 - 13 %    EOSINOPHILS 4  0 - 7 %    BASOPHILS 1  0 - 1 %    ABS. NEUTROPHILS 3.0  1.8 - 8.0 K/UL    ABS. LYMPHOCYTES 2.0  0.8 - 3.5 K/UL    ABS. MONOCYTES 0.4  0.0 - 1.0 K/UL    ABS. EOSINOPHILS 0.3  0.0 - 0.4 K/UL    ABS. BASOPHILS 0.0  0.0 - 0.1 K/UL       A/P    Pt is a 78 y.o. WHITE OR CAUCASIAN male h/o HTN who presents with amourosis fugax concerning for TIA    TIA (transient ischemic attack)  Sudden vision loss yesterday. Since has resolved. Neg optho w/u at OSH prior to admission. No weakness or gait instability  -- CT, MRI neg  -- doppler carotid,  TTE pending  -- neuro evaluated, appreciate recs  -- continue statin  -- LDL 47  -- optho eval at OSH negative  -- continue asa  -- no h/o afib, monitor on tele  -- per neuro, holding BB with recent bradycardia, last HR 60s  -- control HTN. SBP 140s    HTN  Stable.  -- holding metoprolol with bradycardia. Currently 60 HR  -- continue home HCTZ, ARB, terazosin    OSA  Previously on CPAP, recently has stopped CPAP and started "mouth piece"  -- continue home mouth piece while inpt

## 2012-02-14 NOTE — Progress Notes (Addendum)
Discharge paper work is complete, pt given opportunity to ask questions and follow up appointment for next week with MD. IV and Tele D/C'ed. Stoke booklet given and patient will be discharged via wheelchair by medical staff. ~ G.Dent RN   2127: computer sign pad not working the room patient signed discharge papers non-electronically. ~ G. Dent RN

## 2012-02-14 NOTE — Progress Notes (Addendum)
[facility]  [address] [place], [place] [address]  [phone]      Medical Progress Note      NAME: Paul Medina   DOB:  07/24/1930  MRM:  [id number]    Date/Time: 02/14/2012         Subjective:     Chief Complaint:  Patient was seen and examined by me.  Chart reviewed.  Denied vision changes, chest pain, SOB       Objective:       Vitals:       Last 24hrs VS reviewed since prior progress note. Most recent are:    Visit Vitals   Item Reading   ??? BP 144/50   ??? Pulse 60   ??? Temp 98.4 ??F (36.9 ??C)   ??? Resp 18   ??? SpO2 96%     SpO2 Readings from Last 6 Encounters:   02/14/12 96%          Intake/Output Summary (Last 24 hours) at 02/14/12 2130  Last data filed at 02/13/12 2100   Gross per 24 hour   Intake    240 ml   Output      0 ml   Net    240 ml        Exam:     Physical Exam:    Gen:  Well-developed, well-nourished, in no acute distress  HEENT:  Pink conjunctivae, PERRL, hearing intact to voice, moist mucous membranes  Neck:  Supple, without masses, thyroid non-tender  Resp:  No accessory muscle use, clear breath sounds without wheezes rales or rhonchi  Card:  No murmurs, normal S1, S2 without thrills, bruits or peripheral edema  Abd:  Soft, non-tender, non-distended, normoactive bowel sounds are present, no palpable organomegaly and no detectable hernias  Musc:  No cyanosis or clubbing  Skin:  No rashes or ulcers, skin turgor is good  Neuro:  Cranial nerves 3-12 are grossly intact, grip strength is 5/5 bilaterally and dorsi / plantarflexion is 5/5 bilaterally, follows commands appropriately  Psych:  Good insight, oriented to person, place and time, alert      Medications Reviewed: (see below)    Lab Data Reviewed: (see below)    ______________________________________________________________________    Medications:     Current Facility-Administered Medications   Medication Dose Route Frequency   ??? dextrose 5 % - 0.45% NaCl infusion  75 mL/hr IntraVENous CONTINUOUS   ??? aspirin  (ASPIRIN) tablet 325 mg  325 mg Oral ONCE   ??? aspirin chewable tablet 81 mg  81 mg Oral QHS   ??? simvastatin (ZOCOR) tablet 40 mg  40 mg Oral QHS   ??? terazosin (HYTRIN) capsule 10 mg  10 mg Oral QHS   ??? valsartan/hydrochlorothiazide (DIOVAN HCT) 160/25 mg   Oral DAILY   ??? ascorbic acid (VITAMIN C) tablet 500 mg  500 mg Oral DAILY WITH BREAKFAST   ??? .PHARMACY TO SUBSTITUTE PER PROTOCOL       ??? sodium chloride (NS) flush 5-10 mL  5-10 mL IntraVENous Q8H   ??? sodium chloride (NS) flush 5-10 mL  5-10 mL IntraVENous PRN   ??? hydrALAZINE (APRESOLINE) 20 mg/mL injection 20 mg  20 mg IntraVENous Q6H PRN   ??? gadopentetate dimeglumine (MAGNEVIST) 7.5 mmol/15 mL (469.01 mg/mL) contrast solution 15 mL  15 mL IntraVENous RAD ONCE          Lab Review:     Recent Labs   Basename 02/14/12 0250 02/13/12  1345    WBC 5.8 6.8    HGB 10.7* 11.8*    HCT 29.9* 34.0*    PLT 106* 119*     Recent Labs   Basename 02/14/12 0250 02/13/12 1345    NA 147* 142    K 4.3 4.4    CL 109* 106    CO2 29 28    GLU 117* 102*    BUN 19 18    CREA 1.13 1.02    CA 9.0 9.4    MG -- --    PHOS -- --    ALB 3.0* 3.9    TBIL 0.3 0.4    SGOT 24 34    INR -- 1.0     No results found for this basename: glpoc          Assessment / Plan:     Principal Problem:    78 yo hx of HTN, hyperlipidemia, presented w/ L amaurosis fugax    1) L amaurosis fugax/TIA (transient ischemic attack): concern for embolic dz.  Hx not consistent with optic neuritis or temporal arteritis.  Already evaluated by Dr. Dorna Bloom (optho).  Head MRI/MRA was unremarkable.  Awaiting carotid dopplers and echo.  Cont ASA, statin.  Neuro following.      2) HTN (hypertension): metoprolol was d/c due to bradycardia.  Cont diovan/HCTZ    3) HLD (hyperlipidemia): cont statin    4) Coronary atherosclerosis of native coronary artery: cont ASA, statin    5) Hypernatremia: likely due to dehydration.  Cont IVF, monitor BMP    Total time spent with patient: 30 Minutes                  Care Plan discussed with: Patient     Discussed:  Care Plan    Prophylaxis:  Lovenox    Disposition:  Home w/Family           ___________________________________________________    Attending Physician: Twanna Hy. Rivaan Kendall, MD

## 2012-02-14 NOTE — Progress Notes (Signed)
Bedside and Verbal shift change report given to [facility],RN (oncoming nurse) by Peggye Form (offgoing nurse).  Report given with SBAR, Kardex, ED Summary, Intake/Output, MAR, Accordion, Recent Results and Med Rec Status.

## 2012-02-14 NOTE — Progress Notes (Signed)
Neurology:    S-doing well w/o complaints with no new features and return of vision OS.     O-98.4-60-18-144/50-96%  VA near with near lens 20/20 OU. VFF. Fundi normal and aphakia OU. EOMI. PERRL APD negative.  Brain MRI and MRA unrevealing. Carotid dopplers prelim 1-49% stenosis bil ICA. Echo pending. ESR pending.     A-Amaurosis fugax OS. Return to normal visual capabilities.     P- Would suggest adding Plavix to ASA for 90 days then drop back to ASA thereafter. He f/u with ophthalmology in December. Keep vascular risk factors under best control. He is followed by the [facility]. Thanks. JJHMD.

## 2012-02-14 NOTE — Progress Notes (Signed)
[facility]: MED RECONCILIATION    Information obtained from: patient    Significant PMH/Disease States:  Patient Active Problem List   Diagnosis Code   ??? TIA (transient ischemic attack) 435.9   ??? HTN (hypertension) 401.9   ??? HLD (hyperlipidemia) 272.4   ??? Amaurosis fugax of left eye 362.34   ??? Coronary atherosclerosis of native coronary artery 414.01   ??? OSA (obstructive sleep apnea) 327.23     Past Medical History   Diagnosis Date   ??? Hypertension    ??? Hypercholesterolemia    ??? Carpal tunnel syndrome      Chief Complaint for this Admission:   Chief Complaint   Patient presents with   ??? TIA       Allergies: Darvocet a500; Doxycycline; and Sulfa (sulfonamide antibiotics)    Prior to Admission Medications:     Prior to Admission Medications   Medication Last Dose Informant Patient Reported? Taking?   terazosin (HYTRIN) 10 mg capsule 02/13/2012 at Unknown  Yes Yes   Take 10 mg by mouth nightly.   aspirin 81 mg chewable tablet 02/13/2012 at Unknown  Yes Yes   Take 81 mg by mouth nightly.   Omega-3-DHA-EPA-Fish Oil 1,000 (120-180) mg cap 02/13/2012 at Unknown  Yes Yes   Take 1,200 mg by mouth.   ascorbic acid (VITAMIN C) 500 mg tablet 02/13/2012 at Unknown  Yes Yes   Take 500 mg by mouth.   simvastatin (ZOCOR) 20 mg tablet   Yes Yes   Take 20 mg by mouth nightly.   valsartan (DIOVAN) 40 mg tablet   Yes Yes   Take 40 mg by mouth two (2) times a day.   niacin ER (NIASPAN) 500 mg tablet   Yes Yes   Take 1,500 mg by mouth nightly. Takes with ASA 81 mg to reduce flushing   multivitamin (ONE A DAY) tablet   Yes Yes   Take 1 Tab by mouth daily.        Over-the-Counter medications (Did not place into med rec list since patient was unsure of doses at this time)    Iron supplement (possible 65 mg elemental) daily  Vit D daily  Vit B12 daily    Cheree Ditto, [address]D.  [phone]

## 2012-02-14 NOTE — Discharge Summary (Signed)
Physician Discharge Summary     Patient ID:  Paul Medina  [id number]  78 y.o.  1930/08/05    PCP: Robinette Haines, MD     Admit date: 02/13/2012    Discharge date and time: 02/14/2012    Admission Diagnoses: TIA  TIA (transient ischemic attack)    Discharge Diagnoses:    Principal Problem:   *TIA (transient ischemic attack) (02/13/2012)  Active Problems:     HTN (hypertension) (02/13/2012)     HLD (hyperlipidemia) (02/13/2012)     Amaurosis fugax of left eye (02/13/2012)     Coronary atherosclerosis of native coronary artery (02/13/2012)     OSA (obstructive sleep apnea) (02/13/2012)       Procedures/ Tests:  CT head: 02/13/12   IMPRESSION:  NORMAL STUDY    MRI brain: 02/13/12  IMPRESSION: No evidence of acute infarct. 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    MRA head:02/13/12  IMPRESSION: Normal intracranial MRA.     Carotid dopplers 02/13/12  IMPRESSION: Findings are consistent with 0-49% stenosis of the right   internal carotid and 0-49% stenosis of the left internal carotid.   Vertebrals are patent with antegrade flow.     Consults: Neuro: suggested starting plavix for 90 days.    Hospital Course/Assessment and Plan:   Principal Problem:   *TIA (transient ischemic attack) (02/13/2012): Paul Medina presented with 98% loss of vision in his L eye.  He stated that he was only able to see shadows initially.  He denied HA or eye pain.  All imaging was negative for any acute process.  Seen by Dr. Peri Maris who suggested that he start plavix and cont to manage risk factors such as HTN, HLD and etoh usage to a minimum. Cardiac diet and exercise.    Active Problems:     HTN (hypertension) (02/13/2012): cont valsartan.  Metoprolol was stopped as even with the 1/4 of a tablet his HR was still in low 50's.  He cannot tolerate this med.     HLD (hyperlipidemia)  (02/13/2012): cont simvastatin   Amaurosis fugax of left eye (02/13/2012)     Coronary atherosclerosis of native coronary artery(02/13/2012): cont ASA, valsartan, cardiac diet     OSA (obstructive sleep apnea) (02/13/2012)        Condition of patient at discharge: good    Discharge Exam:     Visit Vitals   Item Reading   ??? BP 110/52   ??? Pulse 62   ??? Temp 99.1 ??F (37.3 ??C)   ??? Resp 18   ??? SpO2 95%       Gen:  Well-developed, well-nourished, in no acute distress  HEENT:  Pink conjunctivae, PERRL, hearing intact to voice, moist mucous membranes  Neck:  Supple, without masses, thyroid non-tender  Resp:  No accessory muscle use, clear breath sounds without wheezes rales or rhonchi  Card:  No murmurs, normal S1, S2 without thrills, bruits or peripheral edema  Abd:  Soft, non-tender, non-distended, normoactive bowel sounds are present  Musc:  No cyanosis or clubbing  Skin:  No rashes or ulcers, skin turgor is good  Neuro:  Cranial nerves 3-12 are grossly intact, grip strength is 5/5 bilaterally and dorsi / plantarflexion is 5/5 bilaterally, follows commands appropriately  Psych:  Good insight, oriented to person, place and time, alert  Disposition: home    Patient Instructions:   Current Discharge Medication List      START taking these medications    Details   clopidogrel (PLAVIX) 75 mg tablet Take 1 Tab by mouth daily.  Qty: 30 Tab, Refills: 3         CONTINUE these medications which have NOT CHANGED    Details   simvastatin (ZOCOR) 20 mg tablet Take 20 mg by mouth nightly.      valsartan (DIOVAN) 40 mg tablet Take 40 mg by mouth two (2) times a day.      niacin ER (NIASPAN) 500 mg tablet Take 1,500 mg by mouth nightly. Takes with ASA 81 mg to reduce flushing      multivitamin (ONE A DAY) tablet Take 1 Tab by mouth daily.      terazosin (HYTRIN) 10 mg capsule Take 10 mg by mouth nightly.      aspirin 81 mg chewable tablet Take 81 mg by mouth nightly.      Omega-3-DHA-EPA-Fish Oil 1,000 (120-180) mg cap Take 1,200 mg by  mouth.      ascorbic acid (VITAMIN C) 500 mg tablet Take 500 mg by mouth.         STOP taking these medications       valsartan-hydrochlorothiazide (DIOVAN-HCT) 160-25 mg per tablet Comments:   Reason for Stopping:         metoprolol (LOPRESSOR) 25 mg tablet Comments:   Reason for Stopping:             Activity: activity as tolerated  Diet: Cardiac Diet  Wound Care: None needed    Follow-up with Robinette Haines, MD in 7 days.  Follow up with specialists: none      Approximate time spent in patient care on day of discharge: 45    Signed:  Elyse Jarvis, DO  02/14/2012  9:09 PM

## 2012-02-14 NOTE — Progress Notes (Signed)
Carotid duplex completed. Final results to follow.

## 2012-02-14 NOTE — Progress Notes (Signed)
SPEECH THERAPY SCREENING:  SERVICES ARE NOT INDICATED AT THIS TIME    An InBasket screening referral was triggered for speech therapy based on results obtained during the nursing admission assessment.  The patient???s chart was reviewed and the patient is not appropriate for a skilled therapy evaluation at this time.  Please consult speech therapy if any therapy needs arise.  Thank you.    Paul Medina, SLP

## 2012-02-14 NOTE — Progress Notes (Signed)
Pt states that he does not want the pneumonia shot here, he wants to get it at the [facility] hospital instead. ~ G.DentRN

## 2012-02-14 NOTE — Progress Notes (Signed)
Stroke: After Your Visit     Your Care Instructions     You have had a stroke.  Risk factors for stroke include being overweight, smoking, and sedentary lifestyle. This means that the blood flow to a part of your brain was blocked for some time, which damages the nerve cells in that part of the brain. The part of your body controlled by that part of your brain may not function properly now.    The brain is an amazing organ that can heal itself to some degree. The stroke you had damaged part of your brain, but other parts of your brain may take over in some way for the damaged areas. You have already started this process.    Going home may be hard for you and your family. The more you can try to do for yourself, the better. Remember to take each day one at a time.    Follow-up care is a key part of your treatment and safety. Be sure to make and go to all appointments, and call your doctor if you are having problems. It???s also a good idea to know your test results and keep a list of the medicines you take.    How can you care for yourself at home?   Enter a stroke rehabilitation (rehab) program, if your doctor recommends it. Physical, speech, and occupational therapies can help you manage bathing, dressing, eating, and other basics of daily living.  Eat a heart-healthy diet that is low in cholesterol, saturated fat, and salt. Eat lots of fresh fruits and vegetables and foods high in fiber.  Increase your activities slowly. Take short rest breaks when you get tired. Gradually increase the amount you walk. Start out by walking a little more than you did the day before.  Do not drive until your doctor says it is okay.  It is normal to feel sad or depressed after a stroke. If the ???blues??? last, talk to your doctor.  If you are having problems with urine leakage, go to the bathroom at regular times, including when you first wake up and at bedtime. Also, limit fluids after dinner.  If you are constipated, drink plenty of  fluids, enough so that your urine is light yellow or clear like water. If you have kidney, heart, or liver disease and have to limit fluids, talk with your doctor before you increase the amount of fluids you drink. Set up a regular time for using the toilet. If you continue to have constipation, your doctor may suggest using a bulking agent, such as Metamucil, or a stool softener, laxative, or enema.    Medicines  Take your medicines exactly as prescribed. Call your doctor if you think you are having a problem with your medicine. You may be taking several medicines. ACE (angiotensin-converting enzyme) inhibitors, angiotensin II receptor blockers (ARBs), beta-blockers, diuretics (water pills), and calcium channel blockers control your blood pressure. Statins help lower cholesterol. Your doctor may also prescribe medicines for depression, pain, sleep problems, anxiety, or agitation.  If your doctor has given you medicine that prevents blood clots, such as warfarin (Coumadin), aspirin combined with extended-release dipyridamole (Aggrenox), clopidogrel (Plavix), or aspirin to prevent another stroke, you should:  Tell your dentist, pharmacist, and other health professionals that you take these medicines.  Watch for unusual bruising or bleeding, such as blood in your urine, red or black stools, or bleeding from your nose or gums.  Get regular blood tests to check your clotting time   if you are taking Coumadin.  Wear medical alert jewelry that says you take blood thinners. You can buy this at most drugstores.  Do not take any over-the-counter medicines or herbal products without talking to your doctor first.  If you take birth control pills or hormone replacement therapy, talk to your doctor about whether they are right for you.    For family members and caregivers  Make the home safe. Set up a room so that your loved one does not have to climb stairs. Be sure the bathroom is on the same floor. Move throw rugs and furniture  that could cause falls, and make sure that the lighting is good. Put grab bars and seats in tubs and showers.  Find out what your loved one can do and what he or she needs help with. Try not to do things for your loved one that your loved one can do on his or her own. Help him or her learn and practice new skills.  Visit and talk with your loved one often. Try doing activities together that you both enjoy, such as playing cards or board games. Keep in touch with your loved one's friends as much as you can, and encourage them to visit.  Take care of yourself. Do not try to do everything yourself. Ask other family members to help. Eat well, get enough rest, and take time to do things that you enjoy. Keep up with your own doctor visits, and make sure to take your medicines regularly. Get out of the house as much as you can. Join a local support group. Find out if you qualify for home health care visits to help with rehab or for adult day care.    When should you call for help?     Call 911 anytime you think you may need emergency care. For example, call if:  You have signs of another stroke. These may include:  Sudden numbness, paralysis, or weakness in your face, arm, or leg, especially on only one side of your body.  New problems with walking or balance.  Sudden vision changes.  Drooling or slurred speech.  New problems speaking or understanding simple statements, or you feel confused.  A sudden, severe headache that is different from past headaches.  Call 911 even if these symptoms go away in a few minutes.  You cough up blood.  You vomit blood or what looks like coffee grounds.  You pass maroon or very bloody stools.    Call your doctor now or seek immediate medical care if:  You have new bruises or blood spots under your skin.  You have a nosebleed.  Your gums bleed when you brush your teeth.  You have blood in your urine.  Your stools are black and tarlike or have streaks of blood.  You have vaginal bleeding when  you are not having your period, or heavy period bleeding.  You have new symptoms that may be related to your stroke, such as falls or trouble swallowing.    Watch closely for changes in your health, and be sure to contact your doctor if you have any problems.    Where can you learn more?    Go to http://www.healthwise.net/[facility]    Enter C294  in the search box to learn more about "Stroke: After Your Visit".    ?? 2006-2009 Healthwise, Inc[employer]. Care instructions adapted under license by [employer] (which disclaims liability or warranty for this information). This care instruction is for   use with your licensed healthcare professional. If you have questions about a medical condition or this instruction, always ask your healthcare professional. Healthwise disclaims any warranty or liability for your use of this information.

## 2012-02-15 NOTE — Procedures (Signed)
[facility]  *** FINAL REPORT ***    Name: Paul Medina, Paul Medina  MRN: [id number]  DOB: 17 Jun 1930  Visit: [id number]  Date: 13 Feb 2012    TYPE OF TEST: Cerebrovascular Duplex    REASON FOR TEST  Visual field defect, unspecifd, Left Eye    Right Carotid:-             Proximal               Mid                 Distal  cm/s  Systolic  Diastolic  Systolic  Diastolic  Systolic  Diastolic  CCA:    114.0      14.0                            96.8      20.3  Bulb:  ECA:     93.7       6.5  ICA:     60.1      15.3       73.2      17.4       85.2      27.3  ICA/CCA:  0.6       0.8    ICA Stenosis:    Right Vertebral:-  Finding: Antegrade  Sys:       57.0  Dia:       14.0    Right Subclavian:    Left Carotid:-            Proximal                Mid                 Distal  cm/s  Systolic  Diastolic  Systolic  Diastolic  Systolic  Diastolic  CCA:     94.9      12.5                            89.9      17.4  Bulb:  ECA:     98.6       5.0  ICA:     94.9      13.7       59.9      23.7       82.4      29.9  ICA/CCA:  1.1       0.8    ICA Stenosis:    Left Vertebral:-  Finding: Antegrade  Sys:       53.7  Dia:       16.2    Left Subclavian:    INTERPRETATION/FINDINGS  PROCEDURE:  Evaluation of the extracranial cerebrovascular arteries  with ultrasound (B-mode imaging, pulsed Doppler, color Doppler).  Includes the common carotid, internal carotid, external carotid, and  vertebral arteries.    FINDINGS: Calcific plaque in the bulb and right ICA. Homogeneous  plaque in the bulb and left ICA.    IMPRESSION: Findings are consistent with 0-49% stenosis of the right  internal carotid and 0-49% stenosis of the left internal carotid.  Vertebrals are patent with antegrade flow.    ADDITIONAL COMMENTS    I have personally reviewed the data relevant to the interpretation of  this  study.    TECHNOLOGIST:  Jill Lewis-Sunshine    PHYSICIAN:    Electronically signed by:  Anyra Kaufman T. Mairany Bruno, MD  02/15/2012 11:53 AM

## 2012-02-18 NOTE — ED Provider Notes (Signed)
I personally saw and examined the patient.  I have reviewed and agree with the MLP's findings, including all diagnostic interpretations, and plans as written.   I was present during the key portions of separately billed procedures.    Clydine Parkison E Tiburcio Linder, MD

## 2012-02-21 NOTE — Telephone Encounter (Signed)
Actually the order was from the neurologist he saw there in the [facility] ER. jjhmd

## 2012-02-21 NOTE — Telephone Encounter (Signed)
Spoke with Pt.  Instructed per Dr. Peri Maris to start 2 baby aspirin 81 mg asap.  Pt. Stated he wanted to verify orders from ED with Doctor.  I assured him this order was from him.  Pt. Expressed understanding will start today.

## 2012-02-21 NOTE — Telephone Encounter (Signed)
Pt wants to know how much to increase Asprin?

## 2012-02-21 NOTE — Telephone Encounter (Signed)
Should have been told by [facility] docs to increase to 2 baby ASA daily. jjhmd

## 2012-02-21 NOTE — Telephone Encounter (Signed)
Noted, jjhmd

## 2012-04-16 NOTE — Telephone Encounter (Signed)
Left message for Pt. Informing per Dr. Peri Maris, questions regarding medication either change or dosage related, are best completed with office visit.  Informed Pt. Per chart he is scheduled 05-09-2012 giving him the opportunity to discuss matter.  Instructed if questions remain to return a call to office.

## 2012-04-16 NOTE — Telephone Encounter (Signed)
I would need a formal revisit with him of 30 minutes at least to go over what is new vs old since seeing him in the hospital.jjhmd

## 2012-04-16 NOTE — Telephone Encounter (Signed)
Please call pt regarding if he should increase his medication of  2 baby aspiring and generic for provix, clotidogrel 75 mg 1xdaily b/c he is still having multiple mini strokes. He is also losing vision in left eye. Please call him at [phone].

## 2012-05-09 NOTE — Progress Notes (Signed)
Neurology Consult      Subjective:      Paul Medina is a 78 y.o. male who returns following previous work up  At [facility] for left eye amaurosis fugax. His workup was negative and included a head CT brain MRI brain MRA and echocardiogram showing some trivial regurgitation of some valves. Sed rate normal. Was placed on aspirin and then Plavix when his visual episodes continued. He had several more episodes just involving the left eye and lasting for a period of hours. To make things interesting. He has some fairly pronounced floaters in his left eye as well. Today I made sure we were not confused but on the same page with the difference between floaters and visual compromise. His last visual compromise in the left eye was five or six weeks ago. I was considering a CTA of the head and neck, but will hold off on that testing for now. Will suggest he stay on aspirin and Plavix for now. Will suggest a repeat sed rate out of principle. We'll see him back in two months. If he monitors his visual performance and finds the episodes are recurring again he needs to notify me and we can talk about the CTA at that time. Further clarification still includes no associated symptoms with the visual compromise, such as headaches, eye pain or localizable, weakness, numbness change of speech, or cognition involuntary movements etc.        Current Outpatient Prescriptions   Medication Sig Dispense Refill   ??? clopidogrel (PLAVIX) 75 mg tablet Take 1 Tab by mouth daily.  30 Tab  3   ??? simvastatin (ZOCOR) 20 mg tablet Take 20 mg by mouth nightly.       ??? valsartan (DIOVAN) 40 mg tablet Take 40 mg by mouth two (2) times a day.       ??? niacin ER (NIASPAN) 500 mg tablet Take 1,500 mg by mouth nightly. Takes with ASA 81 mg to reduce flushing       ??? multivitamin (ONE A DAY) tablet Take 1 Tab by mouth daily.       ??? terazosin (HYTRIN) 10 mg capsule Take 10 mg by mouth nightly.       ??? aspirin 81 mg chewable tablet Take 81 mg by  mouth nightly.       ??? Omega-3-DHA-EPA-Fish Oil 1,000 (120-180) mg cap Take 1,200 mg by mouth.       ??? ascorbic acid (VITAMIN C) 500 mg tablet Take 500 mg by mouth.         No current facility-administered medications for this visit.      Allergies   Allergen Reactions   ??? Darvocet A500 (Propoxyphene N-Acetaminophen) Other (comments)     unknown   ??? Doxycycline Nausea and Vomiting   ??? Sulfa (Sulfonamide Antibiotics) Nausea and Vomiting     Past Medical History   Diagnosis Date   ??? Hypertension    ??? Hypercholesterolemia    ??? Carpal tunnel syndrome       Past Surgical History   Procedure Laterality Date   ??? Hx coronary stent placement     ??? Hx orthopaedic       back      History     Social History   ??? Marital Status: SINGLE     Spouse Name: N/A     Number of Children: N/A   ??? Years of Education: N/A     Occupational History   ??? Not on  file.     Social History Main Topics   ??? Smoking status: Former Smoker -- 1.00 packs/day for 30 years   ??? Smokeless tobacco: Former Neurosurgeon     Quit date: 02/12/2001   ??? Alcohol Use: Yes      Comment: rare   ??? Drug Use: No   ??? Sexually Active: Not on file     Other Topics Concern   ??? Not on file     Social History Narrative   ??? No narrative on file      Family History   Problem Relation Age of Onset   ??? Heart Attack Father    ??? Heart Attack Mother    ??? Cancer Mother    ??? Cancer Sister    ??? Stroke Brother       BP 110/67   Pulse 61   Temp(Src) 98 ??F (36.7 ??C) (Oral)   Resp 12   Ht 5\' 9"  (1.753 m)   Wt 83.462 kg (184 lb)   BMI 27.16 kg/m2   SpO2 100%     Review of Systems:   A comprehensive review of systems was negative except for that written in the HPI.      Neuro Exam:     Appearance:  The patient is well developed, well nourished, provides a coherent history and is in no acute distress.   Mental Status: Oriented to time, place and person. Mood and affect appropriate.   Cranial Nerves:   Intact visual fields. Fundi are benign. PERLA and APD negative, EOM's full, no nystagmus, no ptosis.  Facial sensation is normal. Corneal reflexes are intact. Facial movement is symmetric. Hearing is normal bilaterally. Palate is midline with normal sternocleidomastoid and trapezius muscles are normal. Tongue is midline.visual acuity near with near lens was 20/20 OU. Amsler grid was normal OU.   Motor:  5/5 strength in upper and lower proximal and distal muscles. Normal bulk and tone. No fasciculations.   Reflexes:   Deep tendon reflexes 2+/4 and symmetrical.   Sensory:   Normal to touch, pinprick and vibration.   Gait:  Normal gait.   Tremor:   No tremor noted.   Cerebellar:  No cerebellar signs present.   Neurovascular:  Normal heart sounds and regular rhythm, peripheral pulses intact, and no carotid bruits.palpation of temporal arteries objectively normal and subjectively nontender.            Assessment:   Left eye visual compromise as described. Recheck sed rate and for now hold off on any further plans for imaging such as CTA. He will stay on aspirin and Plavix for now and revisit in two months. I need the office notes from his eye doctors.      Plan:   Revisit two months.  Signed by :  Milana Na, MD 05/09/2012

## 2012-05-09 NOTE — Patient Instructions (Signed)
This patient has an interesting history of recurrent left eye visual compromise. It has to be a visual happening, as I can't think of any other situation that comes and goes so quickly and only involving the  Left eye. Would like to get the eye visit notes from Dr. Dorna Bloom and Dr. Kathrine Cords. He will continue on the aspirin and Plavix combination for now. I will see him back in two months. I was considering a CTA scan  Of his head and neck, but he hasn't had any technical return of visual symptoms in the last five or six weeks. I will suggest an updated sed rate, which he says he will get done at the [facility] hospital. Doesn't mention any new problems.

## 2012-05-31 NOTE — Telephone Encounter (Signed)
Called and LM on VM for pt informing him of the below message from Dr. Peri Maris. Left office number if he had any further questions.

## 2012-05-31 NOTE — Telephone Encounter (Signed)
Please verify that you were not going through with the CTA at this time due to lack of symptoms in the past few weeks.

## 2012-05-31 NOTE — Telephone Encounter (Signed)
Patient says Dr. Peri Maris wanted him to have a scan but he hasnt heard anything yet. He said it was called a "cta" scan.

## 2012-05-31 NOTE — Telephone Encounter (Signed)
Pt calling back stating he has had 3 mini strokes recently and would like to speak about this. Would like to know if he should have the CTA scan done b/c of this happening. You can reach pt at [phone] or [phone].

## 2012-05-31 NOTE — Telephone Encounter (Signed)
Yes, per our RV discussion in December, I decided to hold off on the CTA as he had experienced no symptoms in more than several weeks. jjhmd

## 2012-06-01 ENCOUNTER — Encounter

## 2012-06-01 NOTE — Telephone Encounter (Signed)
Spoke with Pt. R/T possible TIA episodes and request for CTA.  Pt. Requests scan to be completed at [facility].  Faxed order to [place].

## 2012-06-01 NOTE — Telephone Encounter (Signed)
Message copied by Osborne Oman on Fri Jun 01, 2012 10:52 AM  ------       Message from: Elmer Picker       Created: Thu May 31, 2012 10:53 AM       Regarding: FW: Non-Urgent Medical Question       Contact: [phone]                       ----- Message -----          From: Lenore Manner          Sent: 05/31/2012  10:47 AM            To: [facility] Nurse Pool       Subject: Non-Urgent Medical Question                                Dr Peri Maris                                           Please  send  a  prescription   for  the  C T A   scan   to   Dr   Arneta Cliche   [facility]   [facility]  clinic   Fax   [phone]    phone      [phone]              I  have  had  partial   left  eye  vision  loss  on  Dec  25 ,   Jan  7 ,     &  today,    Jan  9                 Dr  Quintella Baton.   said  if  you  send  the  prescription  to  him  ,  the  C T A  scan  can  be  done  at  the  [facility]   MRN #  [id number]     phone   [phone]      cell   [phone]  ------

## 2012-06-01 NOTE — Telephone Encounter (Signed)
I just ordered CTA of head. jjhmd

## 2012-06-11 NOTE — Telephone Encounter (Signed)
Patient says he has been bleeding thorough his throat and nose. Please call.

## 2012-06-25 NOTE — Telephone Encounter (Signed)
Patient has a ct scan for 3/24 but his f/u w/Dr. Peri Maris is scheduled for this month, should he wait until he has the scan done before he comes in? Please call.

## 2012-06-25 NOTE — Telephone Encounter (Signed)
Pt returning call

## 2012-10-10 NOTE — Telephone Encounter (Signed)
This is first call I am aware of. Will call him back today. jjhmd

## 2012-10-10 NOTE — Telephone Encounter (Signed)
[facility] calling [facility] and would like to know if you have received the reports. One is from March and the other is from April. Stated he called twice last week and has also sent a message through my chart. Please call to advise.

## 2012-10-11 NOTE — Telephone Encounter (Signed)
Attn Hello: "Please have Amy or Dr. Peri Maris call me back in reference to a message that was left on phone."

## 2012-12-05 NOTE — Patient Instructions (Signed)
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  The testing has been all normal with the exception of the recent CTA of the head done at the [facility] hospital. It shows some mild to moderate narrowing of the posterior cavernous segment of the left ICA.  So the lesson learned is to continue the aspirin and Plavix for now and keep his vascular risk factors, including cholesterol under control with medication and diet. I will see him back in six months. He says his last spell was in April.

## 2012-12-05 NOTE — Progress Notes (Signed)
Neurology Consult      Subjective:      Paul Medina is a 79 y.o. male who returned since having a CTA done of the head and neck at the [facility] hospital in March of this year. All other testing up to this point had been normal. The CTA of the neck was read normal. The CTA of the head was normal except for some suggestible mild to moderate focal narrowing of the posterior cavernous segment of the left ICA. I explained to the patient this does not necessarily mean guilty by association. But I think I would suggest he keep on the aspirin and Plavix for now as it is as good as any other therapy for intracranial disease. Stay on his Lipitor and dietary discretion for control of atherosclerosis. I will see him back in six months. His last episode was in April and again it involved the left eye for variable period of time, perhaps up to an hour, although he does not exactly recall. He has had no other TIA type presentations or strokes.         Current Outpatient Prescriptions   Medication Sig Dispense Refill   ??? atorvastatin (LIPITOR) 20 mg tablet Take  by mouth daily.       ??? clopidogrel (PLAVIX) 75 mg tablet Take 1 Tab by mouth daily.  30 Tab  3   ??? valsartan (DIOVAN) 40 mg tablet Take 40 mg by mouth two (2) times a day.       ??? niacin ER (NIASPAN) 500 mg tablet Take 1,500 mg by mouth nightly. Takes with ASA 81 mg to reduce flushing       ??? multivitamin (ONE A DAY) tablet Take 1 Tab by mouth daily.       ??? terazosin (HYTRIN) 10 mg capsule Take 10 mg by mouth nightly.       ??? aspirin 81 mg chewable tablet Take 81 mg by mouth two (2) times a day.       ??? Omega-3-DHA-EPA-Fish Oil 1,000 (120-180) mg cap Take 1,200 mg by mouth.       ??? ascorbic acid (VITAMIN C) 500 mg tablet Take 500 mg by mouth.       ??? simvastatin (ZOCOR) 20 mg tablet Take 20 mg by mouth nightly.          Allergies   Allergen Reactions   ??? Darvocet A500 (Propoxyphene N-Acetaminophen) Other (comments)     unknown   ??? Doxycycline Nausea and Vomiting   ??? Sulfa  (Sulfonamide Antibiotics) Nausea and Vomiting     Past Medical History   Diagnosis Date   ??? Hypertension    ??? Hypercholesterolemia    ??? Carpal tunnel syndrome       Past Surgical History   Procedure Laterality Date   ??? Hx coronary stent placement     ??? Hx orthopaedic       back      History     Social History   ??? Marital Status: SINGLE     Spouse Name: N/A     Number of Children: N/A   ??? Years of Education: N/A     Occupational History   ??? Not on file.     Social History Main Topics   ??? Smoking status: Former Smoker -- 1.00 packs/day for 30 years   ??? Smokeless tobacco: Former Neurosurgeon     Quit date: 02/12/2001   ??? Alcohol Use: Yes      Comment: rare   ???  Drug Use: No   ??? Sexually Active: Not on file     Other Topics Concern   ??? Not on file     Social History Narrative   ??? No narrative on file      Family History   Problem Relation Age of Onset   ??? Heart Attack Father    ??? Heart Attack Mother    ??? Cancer Mother    ??? Cancer Sister    ??? Stroke Brother       BP 136/78   Pulse 73   Resp 18   Ht 5\' 9"  (1.753 m)   Wt 83.734 kg (184 lb 9.6 oz)   BMI 27.25 kg/m2   SpO2 98%     Review of Systems:   A comprehensive review of systems was negative except for that written in the HPI.      Neuro Exam:     Appearance:  The patient is well developed, well nourished, provides a coherent history and is in no acute distress.   Mental Status: Oriented to time, place and person. Mood and affect appropriate.   Cranial Nerves:   Intact visual fields. Fundi are benign. PERLA, EOM's full, no nystagmus, no ptosis. Facial sensation is normal. Corneal reflexes are intact. Facial movement is symmetric. Hearing is normal bilaterally. Palate is midline with normal sternocleidomastoid and trapezius muscles are normal. Tongue is midline. Visual acuity near with magnification 20/20 OU without hesitation.APD negative.   Motor:  5/5 strength in upper and lower proximal and distal muscles. Normal bulk and tone. No fasciculations.   Reflexes:   Deep tendon  reflexes 2+/4 and symmetrical.   Sensory:   Normal to touch, pinprick and vibration.   Gait:  Normal gait.   Tremor:   No tremor noted.   Cerebellar:  No cerebellar signs present.   Neurovascular:  Normal heart sounds and regular rhythm, peripheral pulses intact, and no carotid bruits.            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 Stay on aspirin and Plavix and medication and dietary control of cholesterol, etc.    Plan:   Revisit six months.  Signed by :  Milana Na, MD 12/05/2012

## 2012-12-10 NOTE — Telephone Encounter (Signed)
Pt would like to let you know that [facility] will be contacting you regarding a refill for generic Plavix 75 mg.

## 2012-12-10 NOTE — Telephone Encounter (Signed)
Noted.  arp

## 2013-02-18 NOTE — Telephone Encounter (Signed)
Lab order sent to pt directly

## 2013-02-18 NOTE — Telephone Encounter (Signed)
Pt stating he wrote a letter to Dr. Peri Maris on 01/22/13 for him to send blood work order [facility]. Needs blood test for myotonic distophy or faxed to [phone] Dr. Jess Barters.

## 2013-02-19 ENCOUNTER — Telehealth

## 2013-02-19 NOTE — Telephone Encounter (Signed)
Pt calling back stated he received the message that the lab order was being mailed to him but stated the order must be faxed to the [facility] hospital. Fax number [phone] to [facility] hospital Dr. Jess Barters.

## 2013-03-01 NOTE — Telephone Encounter (Signed)
Pt called, pt has had 3 mini strokes in 8 days, he is wondering if his meds should be increased, he is hoping for a call back today.

## 2013-03-04 NOTE — Progress Notes (Signed)
Neurology Consult      Subjective:      Paul Medina is a 79 y.o. male returns with having had four of his stereotypical visual episodes of loss of vision left eye in the last 10 days. These can last hours and they are not associated with any pain or discomfort. Has noticed an occasional vacatur ache, which is unusual at best. Says he suddenly realizes he sees a gray fog which he partially sees through and then it sticks around for hours and breaks up over several hours gradually. Never company by other symptoms. Always returns to his baseline vision. He wears reading glasses but otherwise has no visual history. Has floaters in the left eye. No recent trauma rash chills or fever. No weight loss, fatigue, FUO,anorexia, jaw claudication tongue claudication or pain. He is currently on Plavix 75 mg daily and 2 baby aspirin as well. CTA of the head and neck done at the [facility] about 5 months ago was okay except for some inferred mild to moderate narrowing in the posterior cavernous segment of the left ICA. The current game plan is to check the updated sed rate and C-reactive protein values. Consider left temporal artery biopsy. Consider second opinion at academic institution such as [facility] neuro ophthalmology. Increase daily aspirin to 325 mg and keep Plavix at 75 mg daily.       Current Outpatient Prescriptions   Medication Sig Dispense Refill   ??? atorvastatin (LIPITOR) 20 mg tablet Take  by mouth daily.       ??? clopidogrel (PLAVIX) 75 mg tablet Take 1 Tab by mouth daily.  30 Tab  3   ??? valsartan (DIOVAN) 40 mg tablet Take 40 mg by mouth two (2) times a day.       ??? niacin ER (NIASPAN) 500 mg tablet Take 1,500 mg by mouth nightly. Takes with ASA 81 mg to reduce flushing       ??? multivitamin (ONE A DAY) tablet Take 1 Tab by mouth daily.       ??? terazosin (HYTRIN) 10 mg capsule Take 10 mg by mouth nightly.       ??? aspirin 81 mg chewable tablet Take 81 mg by mouth two (2) times a day.       ??? Omega-3-DHA-EPA-Fish Oil 1,000  (120-180) mg cap Take 1,200 mg by mouth.       ??? ascorbic acid (VITAMIN C) 500 mg tablet Take 500 mg by mouth.       ??? simvastatin (ZOCOR) 20 mg tablet Take 20 mg by mouth nightly.          Allergies   Allergen Reactions   ??? Darvocet A500 [Propoxyphene N-Acetaminophen] Other (comments)     unknown   ??? Doxycycline Nausea and Vomiting   ??? Sulfa (Sulfonamide Antibiotics) Nausea and Vomiting     Past Medical History   Diagnosis Date   ??? Hypertension    ??? Hypercholesterolemia    ??? Carpal tunnel syndrome    ??? Sleep apnea       Past Surgical History   Procedure Laterality Date   ??? Hx coronary stent placement     ??? Hx orthopaedic       back      History     Social History   ??? Marital Status: SINGLE     Spouse Name: N/A     Number of Children: N/A   ??? Years of Education: N/A     Occupational History   ???  Not on file.     Social History Main Topics   ??? Smoking status: Former Smoker -- 1.00 packs/day for 30 years   ??? Smokeless tobacco: Former Neurosurgeon     Quit date: 02/12/2001   ??? Alcohol Use: Yes      Comment: rare   ??? Drug Use: No   ??? Sexually Active: Not on file     Other Topics Concern   ??? Not on file     Social History Narrative   ??? No narrative on file      Family History   Problem Relation Age of Onset   ??? Heart Attack Father    ??? Heart Attack Mother    ??? Cancer Mother    ??? Cancer Sister    ??? Stroke Brother       BP 108/64   Pulse 69   Resp 20   Ht 5\' 9"  (1.753 m)   Wt 84.505 kg (186 lb 4.8 oz)   BMI 27.5 kg/m2   SpO2 98%     Review of Systems:   A comprehensive review of systems was negative except for that written in the HPI.      Neuro Exam:     Appearance:  The patient is well developed, well nourished, provides a coherent history and is in no acute distress.   Mental Status: Oriented to time, place and person. Mood and affect appropriate.   Cranial Nerves:   Intact visual fields. Fundi are benign. PERLA, EOM's full, no nystagmus, no ptosis. Facial sensation is normal. Corneal reflexes are intact. Facial movement is  symmetric. Hearing is normal bilaterally. Palate is midline with normal sternocleidomastoid and trapezius muscles are normal. Tongue is midline.   Motor:  5/5 strength in upper and lower proximal and distal muscles. Normal bulk and tone. No fasciculations.   Reflexes:   Deep tendon reflexes 2+/4 and symmetrical.   Sensory:   Normal to touch, pinprick and vibration.   Gait:  Normal gait.   Tremor:   No tremor noted.   Cerebellar:  No cerebellar signs present.   Neurovascular:  Normal heart sounds and regular rhythm, peripheral pulses intact, and no carotid bruits. temporal artery pulses intact and nontender and not rigid or firm. No carotid bruits.            Assessment:   Transient left eye visual loss. Will recheck an updated sed rate in C reactive protein. Consider left temporal artery topsy. Increase aspirin to adult strength and stay on the Plavix daily. Consider sharing patient with academic referral to [facility] neuroophthalmology.     Plan:   Revisit in about five weeks.  Signed by :  Milana Na, MD 03/04/2013

## 2013-03-04 NOTE — Patient Instructions (Signed)
This patient has multiple episodes that are stereotypical for left eye compromise of vision. Will recheck the sed rate in C reactive protein. Consider a left temporal artery biopsy and the possibility of an academic opinion with [facility] neuropathy knowledge. Also have suggested the patient increase the aspirin to adult strength, along with 75 mg of Plavix daily.

## 2013-03-05 ENCOUNTER — Encounter

## 2013-03-05 LAB — C REACTIVE PROTEIN, QT: C-Reactive Protein, Qt: 0.9 mg/L (ref 0.0–4.9)

## 2013-03-05 LAB — SED RATE (ESR): Sed rate (ESR): 2 mm/hr (ref 0–30)

## 2013-03-06 ENCOUNTER — Encounter

## 2013-03-07 NOTE — Telephone Encounter (Signed)
Returned your call.

## 2013-03-07 NOTE — Telephone Encounter (Signed)
Patient says he wants to know the point of the biopsy. He says someone called to get him scheduled for the surgery but doesn't know what its for. Please call.

## 2013-03-07 NOTE — Telephone Encounter (Signed)
This is to rule out a disease than can cause visual loss and occasionally permanently. If he still has questions we can talk, but we did mention this on his last visit. jjhmd

## 2013-03-08 NOTE — Telephone Encounter (Signed)
Pt states he is not sure he wants to pursue a surgical procedure, but will think about it.  Dr. Peri Maris made aware.  arp

## 2013-03-08 NOTE — Telephone Encounter (Signed)
Other than a second opinion, I can't think of any other logical steps we haven't already pursued. jjhmd

## 2013-05-07 NOTE — Progress Notes (Signed)
Neurology Consult      Subjective:      Paul Medina is a 79 y.o. male who returns with previous visits for left eye amaurosis fugax. He has experienced none in recent months. Is on Plavix and aspirin. Politely declined academic opinion from the [facility] neuro ophthalmology. Also was not attracted to the suggestion of a left temporal artery biopsy. Could consider a vasodilator such as verapamil for the future?     In the meantime has more than several months of chronic low back pain continuous. Says he had surgery on the low back with what sounds like a fusion at [facility] years ago. He then reinjured his back about two years ago while lifting and repeat MRI showed he had a significant picture of advanced spondylotic changes and scoliosis. I infer from what he tells me that he had potential surgical disease but that his heart would not be considered safe for the intervention? This sounds like a carryover pain without recent accident or trauma. He has left lower back discomfort and into the gluteal fold. Is most symptomatic when he gets on his feet with walking or bending or moving his left leg toward his next step. The most comfortable is being off his feet and his position of comfort is sleeping on his right side at night. That wall and bladder function is status quo. He denies any numbness. Strength is good but he does have antalgic posturing at times. Has been seen by some doctors at the [facility] and they suggested musculoskeletal difficulties and conservative intervention with over-the-counter remedies and heating pad which has not changed his situation at all.         Current Outpatient Prescriptions   Medication Sig Dispense Refill   ??? umeclidinium-vilanterol (ANORO ELLIPTA) 62.5-25 mcg/actuation dsdv Take  by inhalation.       ??? clopidogrel (PLAVIX) 75 mg tablet Take 1 Tab by mouth daily.  30 Tab  3   ??? valsartan (DIOVAN) 40 mg tablet Take 40 mg by mouth two (2) times a day.       ??? multivitamin (ONE A DAY)  tablet Take 1 Tab by mouth daily.       ??? terazosin (HYTRIN) 10 mg capsule Take 10 mg by mouth nightly.       ??? aspirin 81 mg chewable tablet Take 81 mg by mouth two (2) times a day.       ??? ascorbic acid (VITAMIN C) 500 mg tablet Take 500 mg by mouth.          Allergies   Allergen Reactions   ??? Darvocet A500 [Propoxyphene N-Acetaminophen] Other (comments)     unknown   ??? Doxycycline Nausea and Vomiting   ??? Sulfa (Sulfonamide Antibiotics) Nausea and Vomiting     Past Medical History   Diagnosis Date   ??? Hypertension    ??? Hypercholesterolemia    ??? Carpal tunnel syndrome    ??? Sleep apnea       Past Surgical History   Procedure Laterality Date   ??? Hx coronary stent placement     ??? Hx orthopaedic       back      History     Social History   ??? Marital Status: SINGLE     Spouse Name: N/A     Number of Children: N/A   ??? Years of Education: N/A     Occupational History   ??? Not on file.     Social History  Main Topics   ??? Smoking status: Former Smoker -- 1.00 packs/day for 30 years   ??? Smokeless tobacco: Former Neurosurgeon     Quit date: 02/12/2001   ??? Alcohol Use: Yes      Comment: rare   ??? Drug Use: No   ??? Sexually Active: Not on file     Other Topics Concern   ??? Not on file     Social History Narrative   ??? No narrative on file      Family History   Problem Relation Age of Onset   ??? Heart Attack Father    ??? Heart Attack Mother    ??? Cancer Mother    ??? Cancer Sister    ??? Stroke Brother       Pulse 67   Resp 20   Ht 5\' 9"  (1.753 m)   Wt 86.274 kg (190 lb 3.2 oz)   BMI 28.07 kg/m2   SpO2 98%     Review of Systems:   A comprehensive review of systems was negative except for that written in the HPI.      Neuro Exam:     Appearance:  The patient is well developed, well nourished, provides a coherent history and is in no acute distress.   Mental Status: Oriented to time, place and person. Mood and affect appropriate.   Cranial Nerves:   Intact visual fields. Fundi are benign. PERLA, EOM's full, no nystagmus, no ptosis. Facial sensation is  normal. Corneal reflexes are intact. Facial movement is symmetric. Hearing is normal bilaterally. Palate is midline with normal sternocleidomastoid and trapezius muscles are normal. Tongue is midline. Visual acuity near with magnification 20/20, bilaterally. APD negative.   Motor:  5/5 strength in upper and lower proximal and distal muscles. Normal bulk and tone. No fasciculations.   Reflexes:   Deep tendon reflexes 0-1+/4 and symmetrical.   Sensory:   Normal to touch, pinprick and vibration.   Gait:  Normal gait except consistent difficulties with movements of finesse..   Tremor:   No tremor noted.   Cerebellar:  No cerebellar signs present.   Neurovascular:  Normal heart sounds and regular rhythm, peripheral pulses intact, and no carotid bruits. Straight leg raising on the right enhances pain in the popliteal space. Straight leg raising on the left reproduces discomfort in his low back and gluteal region on the left. Percussion of the spinous and paraspinous processes negative.            Assessment:   Problem one left eye amaurosis fugax. None in recent months. Is on aspirin and Plavix. Politely declined academic opinion for neuro- ophthalmology at [facility]. Also not enthusiastic about temporal artery biopsy.    Problem two. Chronic low back pain. He has had several months of continuous low back pain. Will suggest to the [place] system that he get multiview x-rays of the low back and a dedicated MRI. As far as he's concerned he has failed conservative management.      Plan:   Revisit four months.  Signed by :  Milana Na, MD 05/07/2013

## 2013-05-07 NOTE — Patient Instructions (Addendum)
PRESCRIPTION REFILL POLICY    [facility]   Statement to Patients  August 21, 2012      In an effort to ensure the large volume of patient prescription refills is processed in the most efficient and expeditious manner, we are asking our patients to assist [place] by calling your Pharmacy for all prescription refills, this will include also your  Mail Order Pharmacy. The pharmacy will contact our office electronically to continue the refill process.    Please do not wait until the last minute to call your pharmacy. We need at least 48 hours (2days) to fill prescriptions. We also encourage you to call your pharmacy before going to pick up your prescription to make sure it is ready.     With regard to controlled substance prescription refill requests (narcotic refills) that need to be picked up at our office, we ask your cooperation by providing [place] with at least 72 hours (3days) notice that you will need a refill.    We will not refill narcotic prescription refill requests after 4:00pm on any weekday, Monday through Thursday, or after 2:00pm on Fridays, or on the weekends.      We encourage everyone to explore another way of getting your prescription refill request processed using MyChart, our patient web portal through our electronic medical record system. MyChart is an efficient and effective way to communicate your medication request directly to the office and  downloadable as an app on your smart phone . MyChart also features a review functionality that allows you to view your medication list as well as leave messages for your physician. Are you ready to get connected? If so please review the attatched instructions or speak to any of our staff to get you set up right away!    Thank you so much for your cooperation. Should you have any questions please contact our Research officer, political party.    The Physicians and Staff,  [facility]   If we have ordered testing for you, we do not call patients  with results and we do not give test results over the phone.  We schedule follow up appointments so that your results can be discussed in person and any questions you have regarding them may be addressed.  If something of concern is revealed on your test, we will call you for a sooner follow up appointment.  Additionally, results may be found by using the My Chart feature and one of our patient service representatives at the front desk can give you instructions on how to access this feature of our electronic medical record system.    This patient has had no further visual compromise in the left eye. The recommendations still stand in terms of consideration of second opinion through academic source, such as neuro ophthalmology at [facility]. I proposed a temporal artery biopsy but he was not a fan of this particular suggestion. As I understand it, he remains on the aspirin and Plavix. In the meantime has developed chronic low back pain [facility]-rays multi-views of the low back and an MRI of the same locality.

## 2013-05-27 NOTE — Telephone Encounter (Signed)
Pt called, is following up on the correspondence that was supposed to be mailed to him, he still has not gotten it, please call.

## 2013-05-28 NOTE — Telephone Encounter (Signed)
Pt phoned, would like to come today to pick up the correspondence you were sending out for him, please call.

## 2013-05-29 LAB — AMB EXT CREATININE: Creatinine, External: 1.3

## 2013-05-29 LAB — AMB EXT PSA
PSA, External: 0.026
PSA, External: 0.028

## 2013-05-29 LAB — AMB EXT LDL-C: LDL-C, External: 106.6

## 2013-11-20 LAB — AMB EXT LDL-C: LDL-C, External: 55.6

## 2013-11-20 LAB — AMB EXT CREATININE: Creatinine, External: 1.4

## 2013-11-26 MED ORDER — VERAPAMIL 80 MG TAB
80 mg | ORAL_TABLET | Freq: Three times a day (TID) | ORAL | Status: DC
Start: 2013-11-26 — End: 2014-03-28

## 2013-11-26 NOTE — Patient Instructions (Signed)
Patient will continue the Plavix and aspirin. Will add verapamil to see if it can challenge the left eye symptoms. He was encouraged to get imaging of his neck done at the [facility]. Revisit three months.

## 2013-11-26 NOTE — Progress Notes (Signed)
Neurology Consult      Subjective:      Paul MannerFrank Dudgeon is a 80 y.o. male who returns with stereotypical left eye symptoms. Went several months without any symptoms and last month , had three spells. Interestingly, these episodes could last several hours with cloudiness of vision? No associated eye pain or headache.  Is on aspirin and Plavix. Will try verapamil challenge to see if it has any basis vasospastic qualities.     Neck symptoms. Has had recent referrals of pain  And stiffness in his neck and some  Numbness and pain in his arms and hands. Suggest when he goes to the [facility] to have his neck and wrist with rays and MRI. Has had previous left  Carpal tunnel release.       Current Outpatient Prescriptions   Medication Sig Dispense Refill   ??? Cholecalciferol, Vitamin D3, (VITAMIN D3) 1,000 unit cap Take  by mouth.     ??? ferrous sulfate ER (IRON) 160 mg (50 mg iron) TbER tablet Take 1 Tab by mouth daily.     ??? sildenafil citrate (VIAGRA) 100 mg tablet Take 100 mg by mouth as needed.     ??? verapamil (CALAN) 80 mg tablet Take 1 Tab by mouth three (3) times daily. 90 Tab 6   ??? clopidogrel (PLAVIX) 75 mg tablet Take 1 Tab by mouth daily. 30 Tab 3   ??? valsartan (DIOVAN) 40 mg tablet Take 40 mg by mouth two (2) times a day.     ??? multivitamin (ONE A DAY) tablet Take 1 Tab by mouth daily.     ??? terazosin (HYTRIN) 10 mg capsule Take 10 mg by mouth nightly.     ??? aspirin 81 mg chewable tablet Take 81 mg by mouth two (2) times a day.     ??? ascorbic acid (VITAMIN C) 500 mg tablet Take 500 mg by mouth.     ??? umeclidinium-vilanterol (ANORO ELLIPTA) 62.5-25 mcg/actuation dsdv Take  by inhalation.        Allergies   Allergen Reactions   ??? Darvocet A500 [Propoxyphene N-Acetaminophen] Other (comments)     unknown   ??? Doxycycline Nausea and Vomiting   ??? Sulfa (Sulfonamide Antibiotics) Nausea and Vomiting     Past Medical History   Diagnosis Date   ??? Hypertension    ??? Hypercholesterolemia    ??? Carpal tunnel syndrome     ??? Sleep apnea       Past Surgical History   Procedure Laterality Date   ??? Hx coronary stent placement     ??? Hx orthopaedic       back      History     Social History   ??? Marital Status: SINGLE     Spouse Name: N/A     Number of Children: N/A   ??? Years of Education: N/A     Occupational History   ??? Not on file.     Social History Main Topics   ??? Smoking status: Former Smoker -- 1.00 packs/day for 30 years   ??? Smokeless tobacco: Former NeurosurgeonUser     Quit date: 02/12/2001   ??? Alcohol Use: Yes      Comment: rare   ??? Drug Use: No   ??? Sexual Activity: Not on file     Other Topics Concern   ??? Not on file     Social History Narrative      Family History   Problem Relation Age of Onset   ???  Heart Attack Father    ??? Heart Attack Mother    ??? Cancer Mother    ??? Cancer Sister    ??? Stroke Brother       BP 126/68 mmHg   Pulse 73   Resp 20   Ht 5\' 9"  (1.753 m)   Wt 87.317 kg (192 lb 8 oz)   BMI 28.41 kg/m2   SpO2 98%     Review of Systems:   A comprehensive review of systems was negative except for that written in the HPI.      Neuro Exam:     Appearance:  The patient is well developed, well nourished, provides a coherent history and is in no acute distress.   Mental Status: Oriented to time, place and person. Mood and affect appropriate.   Cranial Nerves:   Intact visual fields. Fundi are benign. PERLA, EOM's full, no nystagmus, no ptosis. Facial sensation is normal. Corneal reflexes are intact. Facial movement is symmetric. Hearing is normal bilaterally. Palate is midline with normal sternocleidomastoid and trapezius muscles are normal. Tongue is midline. Visual acuity near with correction 20/20. Bilaterally. APD negative.   Motor:  5/5 strength in upper and lower proximal and distal muscles. Normal bulk and tone. No fasciculations.   Reflexes:   Deep tendon reflexes 2+/4 and symmetrical.   Sensory:   Normal to touch, pinprick and vibration.   Gait:  Normal gait.   Tremor:   No tremor noted.    Cerebellar:  No cerebellar signs present.   Neurovascular:  Normal heart sounds and regular rhythm, peripheral pulses intact, and no carotid bruits.superficial temporal arteries subjectively nontender and objectively normal. Tinel's negative and Adson's negative and Phalen's evokes pain in the wrists.            Assessment:   Problem one  Left eye  Symptoms. Went several months without symptoms. Would like to challenge with verapamil 80 mg TID. Stay on aspirin and Plavix.    Problem two suggested cervical spondylosis. Would recommend that he go to the [facility] to get imaging of his neck with MRI and x-rays.      Plan:   Revisit three months.  Signed by :  Milana NaJOHN J Herny Scurlock IV, MD 11/26/2013

## 2013-12-26 NOTE — Telephone Encounter (Signed)
Per 'hello' the pt [phone] is returning your call. Thank Bonita Quin

## 2014-02-26 ENCOUNTER — Encounter: Attending: Specialist | Primary: Family Medicine

## 2014-03-10 MED ORDER — CLOPIDOGREL 75 MG TAB
75 mg | ORAL_TABLET | ORAL | Status: AC
Start: 2014-03-10 — End: ?

## 2014-03-12 ENCOUNTER — Ambulatory Visit: Admit: 2014-03-12 | Payer: MEDICARE | Attending: Specialist | Primary: Family Medicine

## 2014-03-12 DIAGNOSIS — H5310 Unspecified subjective visual disturbances: Secondary | ICD-10-CM

## 2014-03-12 NOTE — Progress Notes (Signed)
Neurology Consult      Subjective:      Paul Medina is a 80 y.o. male who returns with previous remote and recent history of periodic blurriness of vision left eye. Have accomplished multiple images, including vascular but never made any solid connection. Has been on aspirin and Plavix and is followed up with ophthalmology . The whole time. On the last visit. Decided to consider the addition of a calcium channel blocker to challenge any type of vaso-spastic phenomena. Unfortunately, made several calls to the cardiologist, but got no call back on whether it would be approved or not? I asked him to continue and stress the need to have a clear yes or no response. Currently has subjective blurriness in the left eye and suggested he stop by the ophthalmology clinic next door to see if they could squeeze him in and document any change of capabilities. For me I was not able to make any objective note of visual compromise today.         Current Outpatient Prescriptions   Medication Sig Dispense Refill   ??? Cholecalciferol, Vitamin D3, (VITAMIN D3) 1,000 unit cap Take  by mouth.     ??? ferrous sulfate ER (IRON) 160 mg (50 mg iron) TbER tablet Take 1 Tab by mouth daily.     ??? sildenafil citrate (VIAGRA) 100 mg tablet Take 100 mg by mouth as needed.     ??? umeclidinium-vilanterol (ANORO ELLIPTA) 62.5-25 mcg/actuation dsdv Take  by inhalation.     ??? valsartan (DIOVAN) 40 mg tablet Take 40 mg by mouth two (2) times a day.     ??? multivitamin (ONE A DAY) tablet Take 1 Tab by mouth daily.     ??? terazosin (HYTRIN) 10 mg capsule Take 10 mg by mouth nightly.     ??? aspirin 81 mg chewable tablet Take 81 mg by mouth two (2) times a day.     ??? ascorbic acid (VITAMIN C) 500 mg tablet Take 500 mg by mouth.     ??? clopidogrel (PLAVIX) 75 mg tablet TAKE 1 TABLET DAILY 90 Tab 3   ??? verapamil (CALAN) 80 mg tablet Take 1 Tab by mouth three (3) times daily. 90 Tab 6      Allergies   Allergen Reactions    ??? Darvocet A500 [Propoxyphene N-Acetaminophen] Other (comments)     unknown   ??? Doxycycline Nausea and Vomiting   ??? Sulfa (Sulfonamide Antibiotics) Nausea and Vomiting     Past Medical History   Diagnosis Date   ??? Hypertension    ??? Hypercholesterolemia    ??? Carpal tunnel syndrome    ??? Sleep apnea       Past Surgical History   Procedure Laterality Date   ??? Hx coronary stent placement     ??? Hx orthopaedic       back      History     Social History   ??? Marital Status: SINGLE     Spouse Name: N/A     Number of Children: N/A   ??? Years of Education: N/A     Occupational History   ??? Not on file.     Social History Main Topics   ??? Smoking status: Former Smoker -- 1.00 packs/day for 30 years   ??? Smokeless tobacco: Former NeurosurgeonUser     Quit date: 02/12/2001   ??? Alcohol Use: Yes      Comment: rare   ??? Drug Use: No   ??? Sexual  Activity: Not on file     Other Topics Concern   ??? Not on file     Social History Narrative      Family History   Problem Relation Age of Onset   ??? Heart Attack Father    ??? Heart Attack Mother    ??? Cancer Mother    ??? Cancer Sister    ??? Stroke Brother       BP 126/74 mmHg   Pulse 68   Resp 18   Ht 5\' 9"  (1.753 m)   Wt 87.998 kg (194 lb)   BMI 28.64 kg/m2   SpO2 98%     Review of Systems:   A comprehensive review of systems was negative except for that written in the HPI.      Neuro Exam:     Appearance:  The patient is well developed, well nourished, provides a coherent history and is in no acute distress.   Mental Status: Oriented to time, place and person. Mood and affect appropriate.   Cranial Nerves:   Intact visual fields. Fundi are benign. PERLA, EOM's full, no nystagmus, no ptosis. Facial sensation is normal. Corneal reflexes are intact. Facial movement is symmetric. Hearing is normal bilaterally. Palate is midline with normal sternocleidomastoid and trapezius muscles are normal. Tongue is midline.visual acuity near with magnification 20/20 . Bilateral.APD negative. Amsler grid normal on the  right and mildly blurry all over left eye.   Motor:  5/5 strength in upper and lower proximal and distal muscles. Normal bulk and tone. No fasciculations.   Reflexes:   Deep tendon reflexes 2+/4 and symmetrical.   Sensory:   Normal to touch, pinprick and vibration.   Gait:  Normal gait.   Tremor:   No tremor noted.   Cerebellar:  No cerebellar signs present.   Neurovascular:  Normal heart sounds and regular rhythm, peripheral pulses intact, and no carotid bruits.            Assessment:   Subjective blurry vision left eye. Made no objective distinctions today and please see eye exam above. Is welcome to stop by his ophthalmology office today since they are next-door and check out any change of exam from their standpoint.  Would like to consider verapamil but he still hasn't had a response from his cardiologist. Continue on aspirin and Plavix. Has had multiple imaging's before and I can't think of any additional testing that hasn't been complexed repetitively.    Plan:   Revisit six months.  Signed by :  Milana NaJOHN J Yonah Medina IV, MD 03/12/2014

## 2014-03-12 NOTE — Patient Instructions (Addendum)
PRESCRIPTION REFILL POLICY    [facility]   Statement to Patients  August 21, 2012      In an effort to ensure the large volume of patient prescription refills is processed in the most efficient and expeditious manner, we are asking our patients to assist us by calling your Pharmacy for all prescription refills, this will include also your  Mail Order Pharmacy. The pharmacy will contact our office electronically to continue the refill process.    Please do not wait until the last minute to call your pharmacy. We need at least 48 hours (2days) to fill prescriptions. We also encourage you to call your pharmacy before going to pick up your prescription to make sure it is ready.     With regard to controlled substance prescription refill requests (narcotic refills) that need to be picked up at our office, we ask your cooperation by providing us with at least 72 hours (3days) notice that you will need a refill.    We will not refill narcotic prescription refill requests after 4:00pm on any weekday, Monday through Thursday, or after 2:00pm on Fridays, or on the weekends.      We encourage everyone to explore another way of getting your prescription refill request processed using MyChart, our patient web portal through our electronic medical record system. MyChart is an efficient and effective way to communicate your medication request directly to the office and  downloadable as an app on your smart phone . MyChart also features a review functionality that allows you to view your medication list as well as leave messages for your physician. Are you ready to get connected? If so please review the attatched instructions or speak to any of our staff to get you set up right away!    Thank you so much for your cooperation. Should you have any questions please contact our Practice Administrator.    The Physicians and Staff,  [facility]     Advance Directives: After Your Visit   Your Care Instructions  An advance directive is a legal way to state your wishes at the end of your life. It tells your family and your doctor what to do if you can no longer say what you want.  There are two main types of advance directives. You can change them any time that your wishes change.  ?? A living will tells your family and your doctor your wishes about life support and other treatment.  ?? A medical power of attorney lets you name a person to make treatment decisions for you when you can't speak for yourself. This person is called a health care agent.  If you do not have an advance directive, decisions about your medical care may be made by a doctor or a judge who doesn't know you.  It may help to think of an advance directive as a gift to the people who care for you. If you have one, they won't have to make tough decisions by themselves.  Follow-up care is a key part of your treatment and safety. Be sure to make and go to all appointments, and call your doctor if you are having problems. It's also a good idea to know your test results and keep a list of the medicines you take.  How can you care for yourself at home?  ?? Discuss your wishes with your loved ones and your doctor. This way, there are no surprises.  ?? Many states have   a unique form. Or you might use a universal form that has been approved by many states. This kind of form can sometimes be completed and stored online. Your electronic copy will then be available wherever you have a connection to the Internet. In most cases, doctors will respect your wishes even if you have a form from a different state.  ?? You don't need a lawyer to do an advance directive. But you may want to get legal advice.  ?? Think about these questions when you prepare an advance directive:  ?? Who do you want to make decisions about your medical care if you are not able to? Many people choose a family member, close friend, or doctor.   ?? Do you know enough about life support methods that might be used? If not, talk to your doctor so you understand.  ?? What are you most afraid of that might happen? You might be afraid of having pain, losing your independence, or being kept alive by machines.  ?? Where would you prefer to die? Choices include your home, a hospital, or a nursing home.  ?? Would you like to have information about hospice care to support you and your family?  ?? Do you want to donate organs when you die?  ?? Do you want certain religious practices performed before you die? If so, put your wishes in the advance directive.  ?? Read your advance directive every year, and make changes as needed.  When should you call for help?  Be sure to contact your doctor if you have any questions.   Where can you learn more?   Go to [email or web address].net/[email or web address]  Enter R264 in the search box to learn more about "Advance Directives: After Your Visit."   ?? 2006-2015 Healthwise, Inc[employer]. Care instructions adapted under license by [employer] (which disclaims liability or warranty for this information). This care instruction is for use with your licensed healthcare professional. If you have questions about a medical condition or this instruction, always ask your healthcare professional. Healthwise, Incorporated disclaims any warranty or liability for your use of this information.  Content Version: 10.5.422740; Current as of: July 12, 2013  Patient will try again to get a response from cardiology as to whether he can take the verapamil or not. Stay on aspirin and Plavix, although I understand he has a procedure coming up short, which will mean the discontinuation for a short while. [facility] if he changes his mind. With his subjective blurry vision in the left eye . He is welcome to get an update from his ophthalmologist today- if possible.

## 2014-03-12 NOTE — Progress Notes (Signed)
Reviewed record in preparation for visit and have necessary documentation  Pt did not bring medication to office visit for review  Information was given to pt on Advanced Directives, Living Will  opportunity was given for questions

## 2014-03-18 NOTE — Telephone Encounter (Signed)
From: Lenore MannerFrank Branscome  To: Sim BoastJohn Hennessey, MD  Sent: 03/14/2014 4:13 PM EDT  Subject:  Non-Urgent Medical Question        Started  taking verapamil 03/13/14

## 2014-03-28 MED ORDER — VERAPAMIL 80 MG TAB
80 mg | ORAL_TABLET | Freq: Three times a day (TID) | ORAL | Status: AC
Start: 2014-03-28 — End: ?

## 2014-04-16 ENCOUNTER — Encounter: Attending: Specialist | Primary: Family Medicine

## 2014-05-13 NOTE — Telephone Encounter (Signed)
Per Hello: "Rx for Verapamil-having a lot of stomach problems with it requesting that you give me a different script need to try something different [facility]

## 2014-05-13 NOTE — Telephone Encounter (Signed)
Stop the verapamil, then will have to reassess the situation. I don't think verapamil stopped his visual spells anyway. jjhmd

## 2014-05-13 NOTE — Telephone Encounter (Signed)
Pt is calling regarding earlier msg.

## 2014-05-14 NOTE — Telephone Encounter (Signed)
Pt calling again about the med he  is really having a lot of stomach issues please advised

## 2014-05-14 NOTE — Telephone Encounter (Signed)
Pt will stop Verapamil and call when feeling better.  arp

## 2014-07-15 NOTE — Telephone Encounter (Signed)
Per Hello: pt had discontinued Verapimil  Is there anything else he can take please advise

## 2014-07-15 NOTE — Telephone Encounter (Signed)
Patient states he is returning nurse Amy's call. Pt can be reached at [phone].

## 2014-07-16 NOTE — Telephone Encounter (Signed)
Returning call about script. Was given medication for blood pressure and it seems to not be working.

## 2014-07-16 NOTE — Telephone Encounter (Signed)
Pt returned call.

## 2014-08-06 NOTE — Telephone Encounter (Signed)
Patient wants to know if Dr. Peri MarisHennessey wants him to try another calcium  chanel blocker . Want to try one for 30 days. [facility]  @ [email or web address]

## 2014-08-06 NOTE — Telephone Encounter (Signed)
I can't think of any other calcium channel blocker that works any better than verapamil. jjhmd

## 2014-08-06 NOTE — Telephone Encounter (Signed)
Pt reports a recent "TIA" on 08/02/14, and would like to try another medication.  Recent Verapamil did not "work".  arp

## 2014-08-12 NOTE — Telephone Encounter (Signed)
Formatting of this note might be different from the original.  error  Electronically signed by Nilda Riggs at 08/12/2014 11:38 AM EDT

## 2014-08-12 NOTE — Telephone Encounter (Signed)
error 

## 2014-09-09 ENCOUNTER — Encounter: Attending: Specialist | Primary: Family Medicine

## 2014-10-17 ENCOUNTER — Encounter: Attending: Specialist | Primary: Family Medicine

## 2014-12-05 ENCOUNTER — Encounter: Attending: Specialist | Primary: Family Medicine

## 2016-09-09 DIAGNOSIS — B3781 Candidal esophagitis: Secondary | ICD-10-CM | POA: Diagnosis not present

## 2016-09-09 DIAGNOSIS — G4733 Obstructive sleep apnea (adult) (pediatric): Secondary | ICD-10-CM | POA: Diagnosis not present

## 2016-09-09 DIAGNOSIS — J449 Chronic obstructive pulmonary disease, unspecified: Secondary | ICD-10-CM | POA: Diagnosis not present

## 2016-10-03 DIAGNOSIS — I2510 Atherosclerotic heart disease of native coronary artery without angina pectoris: Secondary | ICD-10-CM | POA: Diagnosis not present

## 2016-10-03 DIAGNOSIS — Z7982 Long term (current) use of aspirin: Secondary | ICD-10-CM | POA: Diagnosis not present

## 2016-10-03 DIAGNOSIS — I340 Nonrheumatic mitral (valve) insufficiency: Secondary | ICD-10-CM | POA: Diagnosis not present

## 2016-10-03 DIAGNOSIS — Z6828 Body mass index (BMI) 28.0-28.9, adult: Secondary | ICD-10-CM | POA: Diagnosis not present

## 2016-10-03 DIAGNOSIS — I359 Nonrheumatic aortic valve disorder, unspecified: Secondary | ICD-10-CM | POA: Diagnosis not present

## 2016-10-03 DIAGNOSIS — E782 Mixed hyperlipidemia: Secondary | ICD-10-CM | POA: Diagnosis not present

## 2016-10-20 DIAGNOSIS — I119 Hypertensive heart disease without heart failure: Secondary | ICD-10-CM | POA: Diagnosis not present

## 2016-10-20 DIAGNOSIS — Z87891 Personal history of nicotine dependence: Secondary | ICD-10-CM | POA: Diagnosis not present

## 2016-10-20 DIAGNOSIS — Z6828 Body mass index (BMI) 28.0-28.9, adult: Secondary | ICD-10-CM | POA: Diagnosis not present

## 2016-10-20 DIAGNOSIS — E782 Mixed hyperlipidemia: Secondary | ICD-10-CM | POA: Diagnosis not present

## 2016-10-20 DIAGNOSIS — J45909 Unspecified asthma, uncomplicated: Secondary | ICD-10-CM | POA: Diagnosis not present

## 2016-10-20 DIAGNOSIS — I351 Nonrheumatic aortic (valve) insufficiency: Secondary | ICD-10-CM | POA: Diagnosis not present

## 2016-10-20 DIAGNOSIS — I340 Nonrheumatic mitral (valve) insufficiency: Secondary | ICD-10-CM | POA: Diagnosis not present

## 2016-10-20 DIAGNOSIS — I080 Rheumatic disorders of both mitral and aortic valves: Secondary | ICD-10-CM | POA: Diagnosis not present

## 2016-10-20 DIAGNOSIS — I2510 Atherosclerotic heart disease of native coronary artery without angina pectoris: Secondary | ICD-10-CM | POA: Diagnosis not present

## 2016-10-20 DIAGNOSIS — Z7982 Long term (current) use of aspirin: Secondary | ICD-10-CM | POA: Diagnosis not present

## 2016-10-26 DIAGNOSIS — I2510 Atherosclerotic heart disease of native coronary artery without angina pectoris: Secondary | ICD-10-CM | POA: Diagnosis not present

## 2016-10-26 DIAGNOSIS — R0602 Shortness of breath: Secondary | ICD-10-CM | POA: Diagnosis not present

## 2016-11-14 DIAGNOSIS — I359 Nonrheumatic aortic valve disorder, unspecified: Secondary | ICD-10-CM | POA: Diagnosis not present

## 2016-11-14 DIAGNOSIS — Z7982 Long term (current) use of aspirin: Secondary | ICD-10-CM | POA: Diagnosis not present

## 2016-11-14 DIAGNOSIS — I340 Nonrheumatic mitral (valve) insufficiency: Secondary | ICD-10-CM | POA: Diagnosis not present

## 2016-11-14 DIAGNOSIS — I2510 Atherosclerotic heart disease of native coronary artery without angina pectoris: Secondary | ICD-10-CM | POA: Diagnosis not present

## 2016-11-16 DIAGNOSIS — R079 Chest pain, unspecified: Secondary | ICD-10-CM | POA: Diagnosis not present

## 2016-11-16 DIAGNOSIS — Z01818 Encounter for other preprocedural examination: Secondary | ICD-10-CM | POA: Diagnosis not present

## 2016-11-16 DIAGNOSIS — I2510 Atherosclerotic heart disease of native coronary artery without angina pectoris: Secondary | ICD-10-CM | POA: Diagnosis not present

## 2016-11-16 DIAGNOSIS — Z7982 Long term (current) use of aspirin: Secondary | ICD-10-CM | POA: Diagnosis not present

## 2016-11-16 DIAGNOSIS — I10 Essential (primary) hypertension: Secondary | ICD-10-CM | POA: Diagnosis not present

## 2016-11-16 DIAGNOSIS — I080 Rheumatic disorders of both mitral and aortic valves: Secondary | ICD-10-CM | POA: Diagnosis not present

## 2016-11-16 DIAGNOSIS — R001 Bradycardia, unspecified: Secondary | ICD-10-CM | POA: Diagnosis not present

## 2016-11-17 DIAGNOSIS — I2510 Atherosclerotic heart disease of native coronary artery without angina pectoris: Secondary | ICD-10-CM | POA: Diagnosis not present

## 2016-11-17 DIAGNOSIS — Z7982 Long term (current) use of aspirin: Secondary | ICD-10-CM | POA: Diagnosis not present

## 2016-11-17 DIAGNOSIS — I10 Essential (primary) hypertension: Secondary | ICD-10-CM | POA: Diagnosis not present

## 2016-11-17 DIAGNOSIS — I080 Rheumatic disorders of both mitral and aortic valves: Secondary | ICD-10-CM | POA: Diagnosis not present

## 2016-11-17 DIAGNOSIS — I340 Nonrheumatic mitral (valve) insufficiency: Secondary | ICD-10-CM | POA: Diagnosis not present

## 2016-11-17 DIAGNOSIS — I351 Nonrheumatic aortic (valve) insufficiency: Secondary | ICD-10-CM | POA: Diagnosis not present

## 2016-11-25 DIAGNOSIS — M546 Pain in thoracic spine: Secondary | ICD-10-CM | POA: Diagnosis not present

## 2016-11-25 DIAGNOSIS — R1011 Right upper quadrant pain: Secondary | ICD-10-CM | POA: Diagnosis not present

## 2016-11-30 DIAGNOSIS — S060X0A Concussion without loss of consciousness, initial encounter: Secondary | ICD-10-CM | POA: Diagnosis not present

## 2016-11-30 DIAGNOSIS — R1011 Right upper quadrant pain: Secondary | ICD-10-CM | POA: Diagnosis not present

## 2016-11-30 DIAGNOSIS — K7689 Other specified diseases of liver: Secondary | ICD-10-CM | POA: Diagnosis not present

## 2017-01-06 DIAGNOSIS — N401 Enlarged prostate with lower urinary tract symptoms: Secondary | ICD-10-CM | POA: Diagnosis not present

## 2017-01-06 DIAGNOSIS — I10 Essential (primary) hypertension: Secondary | ICD-10-CM | POA: Diagnosis not present

## 2017-01-06 DIAGNOSIS — E782 Mixed hyperlipidemia: Secondary | ICD-10-CM | POA: Diagnosis not present

## 2017-01-20 DIAGNOSIS — G4733 Obstructive sleep apnea (adult) (pediatric): Secondary | ICD-10-CM | POA: Diagnosis not present

## 2017-01-20 DIAGNOSIS — J449 Chronic obstructive pulmonary disease, unspecified: Secondary | ICD-10-CM | POA: Diagnosis not present

## 2017-01-20 DIAGNOSIS — G459 Transient cerebral ischemic attack, unspecified: Secondary | ICD-10-CM | POA: Diagnosis not present

## 2017-02-20 DIAGNOSIS — S060XAA Concussion with loss of consciousness status unknown, initial encounter: Secondary | ICD-10-CM

## 2017-02-20 DIAGNOSIS — W19XXXA Unspecified fall, initial encounter: Secondary | ICD-10-CM

## 2017-02-20 DIAGNOSIS — S060X9A Concussion with loss of consciousness of unspecified duration, initial encounter: Secondary | ICD-10-CM

## 2017-02-20 HISTORY — DX: Unspecified fall, initial encounter: W19.XXXA

## 2017-02-20 HISTORY — DX: Concussion with loss of consciousness of unspecified duration, initial encounter: S06.0X9A

## 2017-02-20 HISTORY — DX: Concussion with loss of consciousness status unknown, initial encounter: S06.0XAA

## 2017-03-06 DIAGNOSIS — Z23 Encounter for immunization: Secondary | ICD-10-CM | POA: Diagnosis not present

## 2017-03-20 DIAGNOSIS — M19041 Primary osteoarthritis, right hand: Secondary | ICD-10-CM | POA: Diagnosis not present

## 2017-03-20 DIAGNOSIS — Z86718 Personal history of other venous thrombosis and embolism: Secondary | ICD-10-CM | POA: Diagnosis not present

## 2017-03-20 DIAGNOSIS — I672 Cerebral atherosclerosis: Secondary | ICD-10-CM | POA: Diagnosis not present

## 2017-03-20 DIAGNOSIS — M7989 Other specified soft tissue disorders: Secondary | ICD-10-CM | POA: Diagnosis not present

## 2017-03-20 DIAGNOSIS — S60221A Contusion of right hand, initial encounter: Secondary | ICD-10-CM | POA: Diagnosis not present

## 2017-03-20 DIAGNOSIS — W010XXA Fall on same level from slipping, tripping and stumbling without subsequent striking against object, initial encounter: Secondary | ICD-10-CM | POA: Diagnosis not present

## 2017-03-20 DIAGNOSIS — M1811 Unilateral primary osteoarthritis of first carpometacarpal joint, right hand: Secondary | ICD-10-CM | POA: Diagnosis not present

## 2017-03-20 DIAGNOSIS — S0181XA Laceration without foreign body of other part of head, initial encounter: Secondary | ICD-10-CM | POA: Diagnosis not present

## 2017-03-20 DIAGNOSIS — G319 Degenerative disease of nervous system, unspecified: Secondary | ICD-10-CM | POA: Diagnosis not present

## 2017-03-20 DIAGNOSIS — S6991XA Unspecified injury of right wrist, hand and finger(s), initial encounter: Secondary | ICD-10-CM | POA: Diagnosis not present

## 2017-03-20 DIAGNOSIS — G9389 Other specified disorders of brain: Secondary | ICD-10-CM | POA: Diagnosis not present

## 2017-03-20 DIAGNOSIS — Z8546 Personal history of malignant neoplasm of prostate: Secondary | ICD-10-CM | POA: Diagnosis not present

## 2017-03-20 DIAGNOSIS — I2510 Atherosclerotic heart disease of native coronary artery without angina pectoris: Secondary | ICD-10-CM | POA: Diagnosis not present

## 2017-03-20 DIAGNOSIS — Z882 Allergy status to sulfonamides status: Secondary | ICD-10-CM | POA: Diagnosis not present

## 2017-03-20 DIAGNOSIS — Z23 Encounter for immunization: Secondary | ICD-10-CM | POA: Diagnosis not present

## 2017-03-20 DIAGNOSIS — S0990XA Unspecified injury of head, initial encounter: Secondary | ICD-10-CM | POA: Diagnosis not present

## 2017-03-22 DIAGNOSIS — S0990XA Unspecified injury of head, initial encounter: Secondary | ICD-10-CM | POA: Diagnosis not present

## 2017-03-22 DIAGNOSIS — W010XXA Fall on same level from slipping, tripping and stumbling without subsequent striking against object, initial encounter: Secondary | ICD-10-CM | POA: Diagnosis not present

## 2017-03-22 DIAGNOSIS — R55 Syncope and collapse: Secondary | ICD-10-CM | POA: Diagnosis not present

## 2017-03-22 DIAGNOSIS — R110 Nausea: Secondary | ICD-10-CM | POA: Diagnosis not present

## 2017-03-22 DIAGNOSIS — R42 Dizziness and giddiness: Secondary | ICD-10-CM | POA: Diagnosis not present

## 2017-03-22 DIAGNOSIS — I2510 Atherosclerotic heart disease of native coronary artery without angina pectoris: Secondary | ICD-10-CM | POA: Diagnosis not present

## 2017-03-22 DIAGNOSIS — C61 Malignant neoplasm of prostate: Secondary | ICD-10-CM | POA: Diagnosis not present

## 2017-03-22 DIAGNOSIS — Z882 Allergy status to sulfonamides status: Secondary | ICD-10-CM | POA: Diagnosis not present

## 2017-03-22 DIAGNOSIS — Z79899 Other long term (current) drug therapy: Secondary | ICD-10-CM | POA: Diagnosis not present

## 2017-03-22 DIAGNOSIS — Z8546 Personal history of malignant neoplasm of prostate: Secondary | ICD-10-CM | POA: Diagnosis not present

## 2017-03-22 DIAGNOSIS — R030 Elevated blood-pressure reading, without diagnosis of hypertension: Secondary | ICD-10-CM | POA: Diagnosis not present

## 2017-03-22 DIAGNOSIS — Z86718 Personal history of other venous thrombosis and embolism: Secondary | ICD-10-CM | POA: Diagnosis not present

## 2017-03-22 DIAGNOSIS — I6782 Cerebral ischemia: Secondary | ICD-10-CM | POA: Diagnosis not present

## 2017-04-26 ENCOUNTER — Ambulatory Visit (INDEPENDENT_AMBULATORY_CARE_PROVIDER_SITE_OTHER): Payer: Medicare Other | Admitting: Physician Assistant

## 2017-04-26 ENCOUNTER — Encounter: Payer: Self-pay | Admitting: Physician Assistant

## 2017-04-26 ENCOUNTER — Encounter (INDEPENDENT_AMBULATORY_CARE_PROVIDER_SITE_OTHER): Payer: Self-pay

## 2017-04-26 VITALS — BP 142/68 | HR 64 | Temp 97.7°F | Ht 67.0 in | Wt 188.0 lb

## 2017-04-26 DIAGNOSIS — Z87898 Personal history of other specified conditions: Secondary | ICD-10-CM

## 2017-04-26 DIAGNOSIS — Z7689 Persons encountering health services in other specified circumstances: Secondary | ICD-10-CM

## 2017-04-26 DIAGNOSIS — Z8673 Personal history of transient ischemic attack (TIA), and cerebral infarction without residual deficits: Secondary | ICD-10-CM | POA: Diagnosis not present

## 2017-04-26 DIAGNOSIS — Z87820 Personal history of traumatic brain injury: Secondary | ICD-10-CM | POA: Diagnosis not present

## 2017-04-26 DIAGNOSIS — Z974 Presence of external hearing-aid: Secondary | ICD-10-CM | POA: Diagnosis not present

## 2017-04-26 DIAGNOSIS — Z9181 History of falling: Secondary | ICD-10-CM | POA: Diagnosis not present

## 2017-04-26 DIAGNOSIS — I10 Essential (primary) hypertension: Secondary | ICD-10-CM | POA: Diagnosis not present

## 2017-04-26 DIAGNOSIS — I6381 Other cerebral infarction due to occlusion or stenosis of small artery: Secondary | ICD-10-CM

## 2017-04-26 DIAGNOSIS — I2510 Atherosclerotic heart disease of native coronary artery without angina pectoris: Secondary | ICD-10-CM | POA: Insufficient documentation

## 2017-04-26 NOTE — Progress Notes (Signed)
HPI:                                                                Mark Holland is a 83 y.o. male who presents to [facility] today to establish care  Current concerns include: none  Patient is also followed by the [facility] in [place]  HTN: taking Losartan 50mg  daily. Compliant with medications. Denies vision change, headache, chest pain with exertion, orthopnea, lightheadedness, syncope and edema. Exercises daily, walking on treadmill.   CAD: taking Rosuvastatin 20 mg daily. Denies myalgias. Denies chest pain. Reports he has had recent stress tests at the [place], which have been normal. Reports stent of his LAD in 2004.  Recent fall: patient reports a trip and fall on Mar 20, 2017 while helping a neighbor carry some boxes. He struck his head on concrete and sustained a laceration to this right temple. He was evaluated in the ED and had a negative head CT. Reports 2 days later he had vertigo, LOC, nausea, and vomiting. He had a negative head MRI and was diagnosed with concussion. Since that time he has head intermittent headaches, 3-4 days per week. They are mild in severity and he has not needed to take any pain relievers. Denies any other falls or episodes of syncope in the last year.  Past Medical History:  Diagnosis Date  . BPH (benign prostatic hyperplasia)   . Chronic back pain   . Concussion 02/2017  . COPD (chronic obstructive pulmonary disease) ([place])   . Coronary artery disease   . Depression   . Fall 02/2017  . Former smoker, stopped smoking in distant past   . Gout   . Hearing loss   . Hypertension   . Osteoporosis   . Scoliosis   . Sleep apnea   . Spinal stenosis   . TIA (transient ischemic attack) 2013  . Valvular disease    Past Surgical History:  Procedure Laterality Date  . HERNIA REPAIR Left    inguinal  . LAMINECTOMY  2009  . PERCUTANEOUS CORONARY STENT INTERVENTION (PCI-S)  2006  . prostate seed implant   2009  . REMOVE AND REPLACE LENS     Social History   Tobacco Use  . Smoking status: Former Smoker    Packs/day: 1.00    Years: 35.00    Pack years: 35.00    Types: Cigarettes  . Smokeless tobacco: Never Used  . Tobacco comment: quit 1993  Substance Use Topics  . Alcohol use: Yes    Alcohol/week: 0.6 oz    Types: 1 Glasses of wine per week   family history includes Cancer in his mother and sister; Heart attack in his father; Heart disease in his father; Heart failure in his mother.  ROS: Review of Systems  HENT: Positive for hearing loss (wears hearing aids).   Neurological: Positive for headaches.  Psychiatric/Behavioral: Positive for depression.  All other systems reviewed and are negative.    Medications: Current Outpatient Medications  Medication Sig Dispense Refill  . Bioflavonoid Products (ESTER C PO) Take 500 mg by mouth daily.    . Cholecalciferol 1000 units capsule Take 1,000 Units by mouth daily.    Marland Kitchen co-enzyme Q-10 50 MG capsule Take 100 mg by  mouth daily.    . Lactobacillus-Inulin ([place] PO) Take by mouth.    . Multiple Vitamins-Minerals (PRESERVISION AREDS 2+MULTI VIT PO) Take by mouth.    Marland Kitchen aspirin 81 MG chewable tablet Chew 81 mg by mouth daily.    Marland Kitchen loratadine (CLARITIN) 10 MG tablet Take 10 mg by mouth daily.    Marland Kitchen losartan (COZAAR) 50 MG tablet Take 50 mg by mouth daily.    . Multiple Vitamin (MULTIVITAMIN) tablet Take by mouth.    . rosuvastatin (CRESTOR) 10 MG tablet Take 20 mg by mouth at bedtime.    Marland Kitchen terazosin (HYTRIN) 10 MG capsule Take 10 mg by mouth daily.     No current facility-administered medications for this visit.    Allergies  Allergen Reactions  . Sulfa Antibiotics Rash  . Lisinopril Other (See Comments)    cough       Objective:  BP (!) 142/68   Pulse 64   Temp 97.7 F (36.5 C) (Oral)   Ht 5\' 7"  (1.702 m)   Wt 188 lb (85.3 kg)   SpO2 98%   BMI 29.44 kg/m  Gen:  alert, not ill-appearing, no  distress, appropriate for age HEENT: head normocephalic without obvious abnormality, conjunctiva and cornea clear, hearing aid present in left ear, trachea midline Pulm: Normal work of breathing, normal phonation Neuro: alert and oriented x 3, no tremor MSK: extremities atraumatic, normal gait and station Skin: intact, no rashes on exposed skin, no jaundice, no cyanosis Psych: well-groomed, cooperative, good eye contact, euthymic mood, affect mood-congruent, speech is articulate, and thought processes clear and goal-directed  Depression screen [facility] 2/9 04/26/2017  Decreased Interest 0  Down, Depressed, Hopeless 1  PHQ - 2 Score 1     No results found for this or any previous visit (from the past 72 hour(s)). No results found.  Acute Interface, Incoming Rad Results - 03/23/2017 12:02 AM EDT COMPARISON: CT dated 03/20/2017   INDICATION: syocope, dizziness. Dizzy. Nausea. 40 hours after head injury.  TECHNIQUE:  Multiplanar, multisequence MR imaging was performed (MRI HEAD WO IV CONTRAST; contrast: none).  FINDINGS: - Craniocervical junction is normal. - Sella turcica contents are intact. - Diffusion-weighted imaging is negative for acute ischemia/infarct. - No intracranial mass, mass effect, or midline shift.  - Diffuse atrophy. Scattered bilateral patchy areas of periventricular and subcortical white matter T2/FLAIR signal hyperintensity. Scattered remote lacunar infarcts. - Ventricles: No hydrocephalus. - No acute intracranial hemorrhage. Small focus of susceptibility along the right posterior corona radiata likely relates to old blood products (series 11, image 17). - Paranasal sinuses: Clear. - Mastoid air cells: Clear. - Skull/marrow/soft tissues: Small fat density lesion in the posterior reticular region likely relates to a lipoma. - Orbits: Bilateral cataract repair. - Additional comments: None.   IMPRESSION: 1.  No acute intracranial abnormalities.  2.  Similar background  of chronic microvascular ischemic changes and remote lacunar infarcts.    Electronically Signed by: Salvadore Oxford  ECG 12 lead ECG 12 lead  Narrative Performed At  Diagnosis Class Normal  Acquisition Device D3K  Ventricular Rate 60  Atrial Rate 60  P-R Interval 190  QRS Duration 104  Q-T Interval 440  QTC Calculation(Bazett) 440  Calculated P Axis 48  Calculated R Axis 51  Calculated T Axis 69    Diagnosis ### Age and gender specific ECG analysis ###  Normal sinus rhythm  Normal ECG  No previous ECGs available  ATSTUPENAS M.D., ELIOT [phone]) on 03/22/2017 11:36:44 PM  certifies that he/she has reviewed the ECG tracing and confirms theindependent  interpretation is correct.       Assessment and Plan: 83 y.o. male with   1. Encounter to establish care - reviewed PMH, PSH, PFH, medications and allergies - reviewed health maintenance, requesting records from [facility] - pneumonia and influenza vaccines UTD per patient - Tdap UTD - negative depression screen - recently had labs drawn at the [place], requesting outside records  2. At moderate risk for fall - fall screen completed - normal get up and go test <20 seconds in office today - discussed fall prevention  3. History of concussion - personally reviewed MRI report from 03/23/17, which showed remote lacunar infarcts and chronic ischemic changes, otherwise unremarkable. Headaches are likely a primary headache syndrome such as tension or secondary to arthritis/cervicalgia  4. Hypertension goal BP (blood pressure) < 140/90 BP Readings from Last 3 Encounters:  04/26/17 (!) 142/68  - cont Losartan daily   5. Coronary artery disease involving native coronary artery of native heart without angina pectoris - cont statin and baby aspirin - Ambulatory referral to Cardiology  6. History of syncope - normal ECG in the emergency department on 03/23/17. Felt to be sequela of concussion. He has not had  any recurrent symptoms. - Ambulatory referral to Cardiology  7. Presence of external hearing-aid   Patient education and anticipatory guidance given Patient agrees with treatment plan Follow-up in 6 months for medication management or sooner as needed if symptoms worsen or fail to improve  Darlyne Russian PA-C

## 2017-04-26 NOTE — Patient Instructions (Addendum)
Dr. Kirk Ruths [phone]   Tylenol 650 mg every 8 hours as needed for headache Return if headaches persist or worsen  General Headache Without Cause A headache is pain or discomfort felt around the head or neck area. There are many causes and types of headaches. In some cases, the cause may not be found. Follow these instructions at home: Managing pain  Take over-the-counter and prescription medicines only as told by your doctor.  Lie down in a dark, quiet room when you have a headache.  If directed, apply ice to the head and neck area: ? Put ice in a plastic bag. ? Place a towel between your skin and the bag. ? Leave the ice on for 20 minutes, 2-3 times per day.  Use a heating pad or hot shower to apply heat to the head and neck area as told by your doctor.  Keep lights dim if bright lights bother you or make your headaches worse. Eating and drinking  Eat meals on a regular schedule.  Lessen how much alcohol you drink.  Lessen how much caffeine you drink, or stop drinking caffeine. General instructions  Keep all follow-up visits as told by your doctor. This is important.  Keep a journal to find out if certain things bring on headaches. For example, write down: ? What you eat and drink. ? How much sleep you get. ? Any change to your diet or medicines.  Relax by getting a massage or doing other relaxing activities.  Lessen stress.  Sit up straight. Do not tighten (tense) your muscles.  Do not use tobacco products. This includes cigarettes, chewing tobacco, or e-cigarettes. If you need help quitting, ask your doctor.  Exercise regularly as told by your doctor.  Get enough sleep. This often means 7-9 hours of sleep. Contact a doctor if:  Your symptoms are not helped by medicine.  You have a headache that feels different than the other headaches.  You feel sick to your stomach (nauseous) or you throw up (vomit).  You have a fever. Get help right away  if:  Your headache becomes really bad.  You keep throwing up.  You have a stiff neck.  You have trouble seeing.  You have trouble speaking.  You have pain in the eye or ear.  Your muscles are weak or you lose muscle control.  You lose your balance or have trouble walking.  You feel like you will pass out (faint) or you pass out.  You have confusion. This information is not intended to replace advice given to you by your health care provider. Make sure you discuss any questions you have with your health care provider. Document Released: 02/16/2008 Document Revised: 10/15/2015 Document Reviewed: 09/01/2014 Elsevier Interactive Patient Education  [employer].

## 2017-10-25 ENCOUNTER — Ambulatory Visit: Payer: Medicare Other | Admitting: Physician Assistant

## 2018-01-10 DIAGNOSIS — M5030 Other cervical disc degeneration, unspecified cervical region: Secondary | ICD-10-CM | POA: Diagnosis not present

## 2018-01-10 DIAGNOSIS — M9981 Other biomechanical lesions of cervical region: Secondary | ICD-10-CM | POA: Diagnosis not present

## 2018-01-17 DIAGNOSIS — M5030 Other cervical disc degeneration, unspecified cervical region: Secondary | ICD-10-CM | POA: Diagnosis not present

## 2018-01-17 DIAGNOSIS — M9981 Other biomechanical lesions of cervical region: Secondary | ICD-10-CM | POA: Diagnosis not present

## 2018-01-26 DIAGNOSIS — M4802 Spinal stenosis, cervical region: Secondary | ICD-10-CM | POA: Diagnosis not present

## 2018-01-26 DIAGNOSIS — G894 Chronic pain syndrome: Secondary | ICD-10-CM | POA: Diagnosis not present

## 2018-01-26 DIAGNOSIS — M5030 Other cervical disc degeneration, unspecified cervical region: Secondary | ICD-10-CM | POA: Diagnosis not present

## 2018-01-26 DIAGNOSIS — M47816 Spondylosis without myelopathy or radiculopathy, lumbar region: Secondary | ICD-10-CM | POA: Diagnosis not present

## 2018-01-26 DIAGNOSIS — M47812 Spondylosis without myelopathy or radiculopathy, cervical region: Secondary | ICD-10-CM | POA: Diagnosis not present

## 2018-01-26 DIAGNOSIS — M9981 Other biomechanical lesions of cervical region: Secondary | ICD-10-CM | POA: Diagnosis not present

## 2018-01-31 DIAGNOSIS — M4802 Spinal stenosis, cervical region: Secondary | ICD-10-CM | POA: Diagnosis not present

## 2018-01-31 DIAGNOSIS — M5030 Other cervical disc degeneration, unspecified cervical region: Secondary | ICD-10-CM | POA: Diagnosis not present

## 2018-02-07 DIAGNOSIS — M5030 Other cervical disc degeneration, unspecified cervical region: Secondary | ICD-10-CM | POA: Diagnosis not present

## 2018-02-07 DIAGNOSIS — M4802 Spinal stenosis, cervical region: Secondary | ICD-10-CM | POA: Diagnosis not present

## 2018-02-16 DIAGNOSIS — M4802 Spinal stenosis, cervical region: Secondary | ICD-10-CM | POA: Diagnosis not present

## 2018-02-16 DIAGNOSIS — M5030 Other cervical disc degeneration, unspecified cervical region: Secondary | ICD-10-CM | POA: Diagnosis not present

## 2018-02-22 DIAGNOSIS — M4802 Spinal stenosis, cervical region: Secondary | ICD-10-CM | POA: Diagnosis not present

## 2018-02-22 DIAGNOSIS — Z8546 Personal history of malignant neoplasm of prostate: Secondary | ICD-10-CM | POA: Diagnosis not present

## 2018-02-22 DIAGNOSIS — M5030 Other cervical disc degeneration, unspecified cervical region: Secondary | ICD-10-CM | POA: Diagnosis not present

## 2018-02-22 DIAGNOSIS — M9981 Other biomechanical lesions of cervical region: Secondary | ICD-10-CM | POA: Diagnosis not present

## 2018-02-22 DIAGNOSIS — I10 Essential (primary) hypertension: Secondary | ICD-10-CM | POA: Diagnosis not present

## 2018-02-22 DIAGNOSIS — G894 Chronic pain syndrome: Secondary | ICD-10-CM | POA: Diagnosis not present

## 2018-02-22 DIAGNOSIS — M47812 Spondylosis without myelopathy or radiculopathy, cervical region: Secondary | ICD-10-CM | POA: Diagnosis not present

## 2018-02-22 DIAGNOSIS — Z86718 Personal history of other venous thrombosis and embolism: Secondary | ICD-10-CM | POA: Diagnosis not present

## 2018-02-28 DIAGNOSIS — R293 Abnormal posture: Secondary | ICD-10-CM | POA: Diagnosis not present

## 2018-02-28 DIAGNOSIS — M4802 Spinal stenosis, cervical region: Secondary | ICD-10-CM | POA: Diagnosis not present

## 2018-02-28 DIAGNOSIS — M6281 Muscle weakness (generalized): Secondary | ICD-10-CM | POA: Diagnosis not present

## 2018-02-28 DIAGNOSIS — R29898 Other symptoms and signs involving the musculoskeletal system: Secondary | ICD-10-CM | POA: Diagnosis not present

## 2018-03-07 DIAGNOSIS — M6281 Muscle weakness (generalized): Secondary | ICD-10-CM | POA: Diagnosis not present

## 2018-03-07 DIAGNOSIS — M4802 Spinal stenosis, cervical region: Secondary | ICD-10-CM | POA: Diagnosis not present

## 2018-03-07 DIAGNOSIS — R293 Abnormal posture: Secondary | ICD-10-CM | POA: Diagnosis not present

## 2018-03-07 DIAGNOSIS — R29898 Other symptoms and signs involving the musculoskeletal system: Secondary | ICD-10-CM | POA: Diagnosis not present

## 2018-03-14 DIAGNOSIS — Z86718 Personal history of other venous thrombosis and embolism: Secondary | ICD-10-CM | POA: Diagnosis not present

## 2018-03-14 DIAGNOSIS — M4802 Spinal stenosis, cervical region: Secondary | ICD-10-CM | POA: Diagnosis not present

## 2018-03-14 DIAGNOSIS — Z8546 Personal history of malignant neoplasm of prostate: Secondary | ICD-10-CM | POA: Diagnosis not present

## 2018-03-14 DIAGNOSIS — I10 Essential (primary) hypertension: Secondary | ICD-10-CM | POA: Diagnosis not present

## 2018-03-21 DIAGNOSIS — Z8546 Personal history of malignant neoplasm of prostate: Secondary | ICD-10-CM | POA: Diagnosis not present

## 2018-03-21 DIAGNOSIS — I10 Essential (primary) hypertension: Secondary | ICD-10-CM | POA: Diagnosis not present

## 2018-03-21 DIAGNOSIS — M4802 Spinal stenosis, cervical region: Secondary | ICD-10-CM | POA: Diagnosis not present

## 2018-03-21 DIAGNOSIS — Z86718 Personal history of other venous thrombosis and embolism: Secondary | ICD-10-CM | POA: Diagnosis not present

## 2018-04-25 DIAGNOSIS — I2510 Atherosclerotic heart disease of native coronary artery without angina pectoris: Secondary | ICD-10-CM | POA: Diagnosis not present

## 2018-04-25 DIAGNOSIS — M4802 Spinal stenosis, cervical region: Secondary | ICD-10-CM | POA: Diagnosis not present

## 2018-05-09 DIAGNOSIS — I2510 Atherosclerotic heart disease of native coronary artery without angina pectoris: Secondary | ICD-10-CM | POA: Diagnosis not present

## 2018-05-09 DIAGNOSIS — M4802 Spinal stenosis, cervical region: Secondary | ICD-10-CM | POA: Diagnosis not present

## 2018-06-06 LAB — LIPID PANEL
Cholesterol: 107 (ref 0–200)
HDL: 47 (ref 35–70)
LDL Cholesterol: 46
Triglycerides: 69 (ref 40–160)

## 2018-06-06 LAB — BASIC METABOLIC PANEL
BUN: 23 — AB (ref 4–21)
Creatinine: 1.3 (ref 0.6–1.3)
Glucose: 98
Potassium: 4.7 (ref 3.4–5.3)
Sodium: 141 (ref 137–147)

## 2018-06-06 LAB — HEPATIC FUNCTION PANEL
ALT: 30 (ref 10–40)
AST: 20 (ref 14–40)
Alkaline Phosphatase: 63 (ref 25–125)
Bilirubin, Direct: 0.2 (ref 0.01–0.4)
Bilirubin, Total: 0.8

## 2018-06-06 LAB — CBC AND DIFFERENTIAL
HCT: 33 — AB (ref 41–53)
Hemoglobin: 11.5 — AB (ref 13.5–17.5)

## 2018-06-06 LAB — VITAMIN D 25 HYDROXY (VIT D DEFICIENCY, FRACTURES): Vit D, 25-Hydroxy: 63

## 2018-06-06 LAB — TSH: TSH: 2.18 (ref 0.41–5.90)

## 2018-06-06 LAB — HEMOGLOBIN A1C: Hemoglobin A1C: 5.2

## 2018-10-09 ENCOUNTER — Other Ambulatory Visit: Payer: Self-pay

## 2018-10-09 ENCOUNTER — Ambulatory Visit (INDEPENDENT_AMBULATORY_CARE_PROVIDER_SITE_OTHER): Payer: Medicare Other | Admitting: Physician Assistant

## 2018-10-09 ENCOUNTER — Encounter: Payer: Self-pay | Admitting: Physician Assistant

## 2018-10-09 ENCOUNTER — Ambulatory Visit (INDEPENDENT_AMBULATORY_CARE_PROVIDER_SITE_OTHER): Payer: Medicare Other

## 2018-10-09 VITALS — BP 134/72 | HR 55 | Temp 97.4°F | Resp 14 | Wt 189.0 lb

## 2018-10-09 DIAGNOSIS — S060X0D Concussion without loss of consciousness, subsequent encounter: Secondary | ICD-10-CM

## 2018-10-09 DIAGNOSIS — G44319 Acute post-traumatic headache, not intractable: Secondary | ICD-10-CM | POA: Diagnosis not present

## 2018-10-09 DIAGNOSIS — Z041 Encounter for examination and observation following transport accident: Secondary | ICD-10-CM | POA: Diagnosis not present

## 2018-10-09 DIAGNOSIS — R51 Headache: Secondary | ICD-10-CM | POA: Diagnosis not present
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
## 2018-10-09 NOTE — Patient Instructions (Signed)
Post-Concussion Syndrome    A concussion is a brain injury from a direct hit (blow) to your head or body. This blow causes your brain to shake quickly back and forth inside your skull. This can damage brain cells and cause chemical changes in your brain. Concussions are usually not life-threatening but can cause several serious symptoms.  Post-concussion syndrome is when symptoms that occur after a concussion last longer than normal. These symptoms can last from weeks to months.  What are the causes?  The cause of this condition is not known. It can happen whether your head injury was mild or severe.  What increases the risk?  You are more likely to develop this condition if:  · You are male.  · You are a child, teen, or young adult.  · You had a past head injury.  · You have a history of headaches.  · You have depression or anxiety.  What are the signs or symptoms?  Physical symptoms  · Headaches.  · Tiredness.  · Dizziness.  · Weakness.  · Blurry vision.  · Sensitivity to light.  · Hearing difficulties.  Mental and emotional symptoms  · Memory difficulties.  · Difficulty with concentration.  · Difficulty sleeping or staying asleep.  · Feeling irritable.  · Anxiety or depression.  · Difficulty learning new things.  How is this diagnosed?  This condition may be diagnosed based on:  · Your symptoms.  · A description of your injury.  · Your medical history.  Your health care provider may order other tests such as:  · Brain function tests (neurological testing).  · CT scan.  How is this treated?  Treatment for this condition may depend on your symptoms. Symptoms usually go away on their own over time. Treatments may include:  · Medicines for headaches.  · Resting your brain and body for a few days after your injury.  · Rehabilitation therapy, such as:  ? Physical or occupational therapy. This may include exercises to help with balance and dizziness.  ? Mental health counseling.  ? Speech therapy.  ? Vision therapy. A  brain and eye specialist can recommend treatments for vision problems.  Follow these instructions at home:  Medicines  · Take over-the-counter and prescription medicines only as told by your health care provider.  · Avoid opioid prescription pain medicines when recovering from a concussion.  Activity  · Limit your mental activities for the first few days after your injury, such as:  ? Homework or job-related work.  ? Complex thinking.  ? Watching TV, and using a computer or phone.  ? Playing memory games and puzzles.  ? Gradually return to your normal activity level. If a certain activity brings on your symptoms, stop or slow down until you can do the activity without it triggering your symptoms.  · Limit physical activity, such as exercise or sports, for the first few days after a concussion. Gradually return to normal activity as told by your health care provider.  ? If a certain activity brings on your symptoms, stop or slow down until you can do the activity without it triggering your symptoms.  · Rest. Rest helps your brain heal. Make sure you:  ? Get plenty of sleep at night. Most adults should get at least 7-9 hours of sleep each night.  ? Rest during the day. Take naps or rest breaks when you feel tired.  · Do not do high-risk activities that could cause a second concussion,   such as riding a bike or playing sports. Having another concussion before the first one has healed can be dangerous.  General instructions  · Do not drink alcohol until your health care provider says you can.  · Keep track of the frequency and the severity of your symptoms. Give this information to your health care provider.  · Keep all follow-up visits as directed by your health care provider. This is important.  Contact a health care provider if:  · Your symptoms do not improve.  · You have another injury.  Get help right away if you:  · Have a severe or worsening headache.  · Are confused.  · Have trouble staying awake.  · Pass  out.  · Vomit.  · Have weakness or numbness in any part of your body.  · Have a seizure.  · Have trouble speaking.  Summary  · Post-concussion syndrome is when symptoms that occur after a concussion last longer than normal.  · Symptoms usually go away on their own over time. Depending on your symptoms, you may need treatment, such as medicines or rehabilitation therapy.  · Rest your brain and body for a few days after your injury. Gradually return to activities, as told by your health care provider.  · Get plenty of sleep, and avoid alcohol and opioid pain medicines while recovering from a concussion.  This information is not intended to replace advice given to you by your health care provider. Make sure you discuss any questions you have with your health care provider.  Document Released: 10/29/2001 Document Revised: 06/13/2017 Document Reviewed: 06/13/2017  Elsevier Interactive Patient Education © 2019 [employer]

## 2018-10-10 ENCOUNTER — Encounter: Payer: Self-pay | Admitting: Physician Assistant

## 2018-10-10 NOTE — Progress Notes (Signed)
Negative CTH for cerebral hemorrhage Incidental moderate atrophy and chronic microvascular ischemic changes in a patient with known hx of CVA Patient was notified 10/09/18 shortly after his scan

## 2018-10-19 DIAGNOSIS — G44319 Acute post-traumatic headache, not intractable: Secondary | ICD-10-CM | POA: Insufficient documentation

## 2018-10-23 ENCOUNTER — Ambulatory Visit (INDEPENDENT_AMBULATORY_CARE_PROVIDER_SITE_OTHER): Payer: Medicare Other | Admitting: Family Medicine

## 2018-10-23 ENCOUNTER — Encounter: Payer: Self-pay | Admitting: Family Medicine

## 2018-10-23 VITALS — BP 112/66 | HR 72 | Temp 98.4°F

## 2018-10-23 DIAGNOSIS — Z9989 Dependence on other enabling machines and devices: Secondary | ICD-10-CM

## 2018-10-23 DIAGNOSIS — G4733 Obstructive sleep apnea (adult) (pediatric): Secondary | ICD-10-CM | POA: Diagnosis not present

## 2018-10-23 DIAGNOSIS — S060X0A Concussion without loss of consciousness, initial encounter: Secondary | ICD-10-CM

## 2018-10-23 DIAGNOSIS — Z7409 Other reduced mobility: Secondary | ICD-10-CM
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
## 2018-10-23 NOTE — Patient Instructions (Signed)
Thank you for coming in today. I think you will benefit from balance and vestibular PT.  Recheck in 2-4 weeks.   Return sooner if needed.     Concussion, Adult A concussion is a brain injury from a direct hit (blow) to the head or body. This injury causes the brain to shake quickly back and forth inside the skull. It is caused by:  A hit to the head.  A quick and sudden movement (jolt) of the head or neck. How fast you will get better from a concussion depends on many things. Recovery can take time. It is important to wait to return to activity until a doctor says it is safe and your symptoms are all gone. Follow these instructions at home: Activity  Limit activities that need a lot of thought or concentration. You may need to talk with your work Freight forwarder or teachers about this. Limit activities such as: ? Homework or work for your job. ? Watching TV. ? Computer work. ? Playing memory games and puzzles.  Rest. Rest helps the brain to heal. Make sure you: ? Get plenty of sleep at night. Do not stay up late. ? Rest during the day. Take naps or rest breaks when you feel tired.  Do not do activities that could cause a second concussion, such as riding a bike or playing sports. It can be dangerous if you get another concussion before the first one has healed.  Ask your doctor when you can return to your normal activities, like driving, riding a bike, or using machinery. Your ability to react may be slower. Do not do these activities if you are dizzy. Your doctor will likely give you a plan for slowly going back to activities. General instructions  Take over-the-counter and prescription medicines only as told by your doctor.  Do not drink alcohol until your doctor says you can.  Watch your symptoms and tell other people to do the same. Other problems (complications) can happen after a concussion. Older adults with a brain injury may have a higher risk of serious problems, such as a  blood clot in the brain.  Tell your work Freight forwarder, teachers, Government social research officer, school counselor, coach, or Product/process development scientist about your injury and symptoms. Tell them about what you can or cannot do. They should watch you for: ? More problems with attention or concentration. ? More trouble remembering or learning new information. ? More time needed to do tasks or assignments. ? Being more annoyed (irritable) or having a harder time dealing with stress. ? Any other symptoms that get worse.  Keep all follow-up visits as told by your doctor. This is important. Prevention  It is very important that you donot get another brain injury, especially before you have healed. In rare cases, another injury can cause permanent brain damage, brain swelling, or death. You have the most risk if you get another head injury in the first 7-10 days after you were hurt before. To avoid injuries: ? Avoid activities that could make you get a second concussion, like contact sports. ? When you have returned to sports or activities:  Avoid plays or moves that can cause you to crash into another person. This is how most concussions happen.  Follow the rules and be respectful of other players. ? Get regular exercise that includes strength and balance training. ? Wear a helmet when you do activities like:  Biking.  Skiing.  Skateboarding.  Skating. ? Helmets can help protect you from serious  skull and brain injuries, but they do not protect your from a concussion. Even when wearing a helmet, you should avoid being hit in the head. Contact a doctor if:  Your symptoms get worse or they do not get better.  You have new symptoms.  You have another injury. Get help right away if:  You have bad headaches or your headaches get worse.  You have weakness in any part of your body.  You are confused.  Your coordination gets worse.  You keep throwing up (vomiting).  You feel more sleepy than normal.  You twitch  or shake violently (convulse) or have a seizure.  Your speech is not clear (is slurred).  You have strange behavior changes.  You have changes in how you see (vision).  You pass out (lose consciousness). Summary  A concussion is a brain injury from a direct hit (blow) to the head or body.  This condition is treated with rest and careful watching of symptoms.  If you keep having symptoms, call your doctor. This information is not intended to replace advice given to you by your health care provider. Make sure you discuss any questions you have with your health care provider. Document Released: 04/27/2009 Document Revised: 06/20/2017 Document Reviewed: 06/20/2017 Elsevier Interactive Patient Education  2019 [employer].

## 2018-11-01 ENCOUNTER — Encounter: Payer: Self-pay | Admitting: Physical Therapy

## 2018-11-01 ENCOUNTER — Other Ambulatory Visit: Payer: Self-pay

## 2018-11-01 ENCOUNTER — Ambulatory Visit (INDEPENDENT_AMBULATORY_CARE_PROVIDER_SITE_OTHER): Payer: Medicare Other | Admitting: Physical Therapy

## 2018-11-01 DIAGNOSIS — H8111 Benign paroxysmal vertigo, right ear: Secondary | ICD-10-CM | POA: Diagnosis not present

## 2018-11-01 DIAGNOSIS — R42 Dizziness and giddiness: Secondary | ICD-10-CM

## 2018-11-01 DIAGNOSIS — R2681 Unsteadiness on feet: Secondary | ICD-10-CM

## 2018-11-01 DIAGNOSIS — R2689 Other abnormalities of gait and mobility: Secondary | ICD-10-CM
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
## 2018-11-01 NOTE — Patient Instructions (Signed)
Access Code: [email or web address]  URL: [email or web address]  Date: 11/01/2018  Prepared by: Faustino Congress   Exercises  Seated Gaze Stabilization with Head Rotation - 1 sets - 30 seconds - 2x daily - 7x weekly  Seated Gaze Stabilization with Head Nod - 1 sets - 30 Seconds - 2x daily - 7x weekly  Wide Stance with Eyes Closed on Foam Pad - 5 reps - 1 sets - 10-15 sec hold - 2x daily - 7x weekly  Wide Stance with Head Nods on Foam Pad - 10 reps - 1 sets - 2x daily - 7x weekly  Patient Education  Gaze Stabilization

## 2018-11-06 ENCOUNTER — Ambulatory Visit (INDEPENDENT_AMBULATORY_CARE_PROVIDER_SITE_OTHER): Payer: Medicare Other | Admitting: Physical Therapy

## 2018-11-06 ENCOUNTER — Other Ambulatory Visit: Payer: Self-pay

## 2018-11-06 DIAGNOSIS — R2689 Other abnormalities of gait and mobility: Secondary | ICD-10-CM | POA: Diagnosis not present

## 2018-11-06 DIAGNOSIS — R2681 Unsteadiness on feet: Secondary | ICD-10-CM

## 2018-11-06 DIAGNOSIS — R42 Dizziness and giddiness: Secondary | ICD-10-CM | POA: Diagnosis not present
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
## 2018-11-08 DIAGNOSIS — M47812 Spondylosis without myelopathy or radiculopathy, cervical region: Secondary | ICD-10-CM | POA: Diagnosis not present

## 2018-11-08 DIAGNOSIS — M7918 Myalgia, other site: Secondary | ICD-10-CM | POA: Diagnosis not present

## 2018-11-08 DIAGNOSIS — M542 Cervicalgia: Secondary | ICD-10-CM | POA: Diagnosis not present

## 2018-11-09 ENCOUNTER — Ambulatory Visit (INDEPENDENT_AMBULATORY_CARE_PROVIDER_SITE_OTHER): Payer: Medicare Other | Admitting: Physical Therapy

## 2018-11-09 ENCOUNTER — Other Ambulatory Visit: Payer: Self-pay

## 2018-11-09 ENCOUNTER — Encounter: Payer: Self-pay | Admitting: Physical Therapy

## 2018-11-09 DIAGNOSIS — R42 Dizziness and giddiness: Secondary | ICD-10-CM

## 2018-11-09 DIAGNOSIS — H8111 Benign paroxysmal vertigo, right ear: Secondary | ICD-10-CM

## 2018-11-09 DIAGNOSIS — R2689 Other abnormalities of gait and mobility: Secondary | ICD-10-CM

## 2018-11-09 DIAGNOSIS — R2681 Unsteadiness on feet: Secondary | ICD-10-CM

## 2018-11-09 NOTE — Patient Instructions (Signed)
Access Code: [email or web address]  URL: [email or web address]  Date: 11/09/2018  Prepared by: Faustino Congress   Exercises  Wide Stance with Eyes Closed on Foam Pad - 5 reps - 1 sets - 10-15 sec hold - 2x daily - 7x weekly  Wide Stance with Head Nods on Foam Pad - 10 reps - 1 sets - 2x daily - 7x weekly  Standing Gaze Stabilization with Head Rotation - 1 sets - 30 Seconds - 2x daily - 7x weekly  Standing Gaze Stabilization with Head Nod - 1 reps - 30 seconds - 1x daily - 7x weekly  Patient Education  Gaze Stabilization
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
## 2018-11-13 ENCOUNTER — Ambulatory Visit (INDEPENDENT_AMBULATORY_CARE_PROVIDER_SITE_OTHER): Payer: Medicare Other | Admitting: Family Medicine

## 2018-11-13 ENCOUNTER — Other Ambulatory Visit: Payer: Self-pay

## 2018-11-13 ENCOUNTER — Ambulatory Visit (INDEPENDENT_AMBULATORY_CARE_PROVIDER_SITE_OTHER): Payer: Medicare Other | Admitting: Physical Therapy

## 2018-11-13 ENCOUNTER — Encounter: Payer: Self-pay | Admitting: Family Medicine

## 2018-11-13 VITALS — BP 125/76 | HR 72 | Temp 98.3°F | Wt 188.0 lb

## 2018-11-13 DIAGNOSIS — R2681 Unsteadiness on feet: Secondary | ICD-10-CM | POA: Diagnosis not present

## 2018-11-13 DIAGNOSIS — S060X0A Concussion without loss of consciousness, initial encounter: Secondary | ICD-10-CM | POA: Diagnosis not present

## 2018-11-13 DIAGNOSIS — R2689 Other abnormalities of gait and mobility: Secondary | ICD-10-CM
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
## 2018-11-13 NOTE — Patient Instructions (Signed)
Thank you for coming in today. Recheck in 3-4 week.  Return sooner if needed.   Work on your exercises based on PT.

## 2018-11-13 NOTE — Patient Instructions (Signed)
AMBULATION: Side Step    AT COUNTER: Step sideways. Repeat in opposite direction.  Lift knee a little each time, as if you are stepping over a small box.   Move nice and slow.  Side step to one end of counter, and return.

## 2018-11-15 ENCOUNTER — Encounter: Payer: Self-pay | Admitting: Physical Therapy

## 2018-11-15 ENCOUNTER — Other Ambulatory Visit: Payer: Self-pay

## 2018-11-15 ENCOUNTER — Ambulatory Visit (INDEPENDENT_AMBULATORY_CARE_PROVIDER_SITE_OTHER): Payer: Medicare Other | Admitting: Physical Therapy

## 2018-11-15 DIAGNOSIS — R2681 Unsteadiness on feet: Secondary | ICD-10-CM | POA: Diagnosis not present

## 2018-11-15 DIAGNOSIS — R2689 Other abnormalities of gait and mobility: Secondary | ICD-10-CM
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
## 2018-11-20 ENCOUNTER — Encounter: Payer: Self-pay | Admitting: Physical Therapy

## 2018-11-20 ENCOUNTER — Other Ambulatory Visit: Payer: Self-pay

## 2018-11-20 ENCOUNTER — Ambulatory Visit (INDEPENDENT_AMBULATORY_CARE_PROVIDER_SITE_OTHER): Payer: Medicare Other | Admitting: Physical Therapy

## 2018-11-20 DIAGNOSIS — R2681 Unsteadiness on feet: Secondary | ICD-10-CM | POA: Diagnosis not present

## 2018-11-20 DIAGNOSIS — R42 Dizziness and giddiness: Secondary | ICD-10-CM | POA: Diagnosis not present

## 2018-11-20 DIAGNOSIS — R2689 Other abnormalities of gait and mobility: Secondary | ICD-10-CM | POA: Diagnosis not present
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
## 2018-11-22 ENCOUNTER — Other Ambulatory Visit: Payer: Self-pay

## 2018-11-22 ENCOUNTER — Ambulatory Visit (INDEPENDENT_AMBULATORY_CARE_PROVIDER_SITE_OTHER): Payer: Medicare Other | Admitting: Physical Therapy

## 2018-11-22 ENCOUNTER — Encounter: Payer: Self-pay | Admitting: Physical Therapy

## 2018-11-22 DIAGNOSIS — R2681 Unsteadiness on feet: Secondary | ICD-10-CM | POA: Diagnosis not present

## 2018-11-22 DIAGNOSIS — R42 Dizziness and giddiness: Secondary | ICD-10-CM | POA: Diagnosis not present

## 2018-11-22 DIAGNOSIS — R2689 Other abnormalities of gait and mobility: Secondary | ICD-10-CM

## 2018-11-22 DIAGNOSIS — H8111 Benign paroxysmal vertigo, right ear: Secondary | ICD-10-CM | POA: Diagnosis not present
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
## 2018-11-22 NOTE — Patient Instructions (Signed)
Access Code: [email or web address]  URL: [email or web address]  Date: 11/22/2018  Prepared by: Faustino Congress   Exercises  Wide Stance with Eyes Closed on Foam Pad - 5 reps - 1 sets - 10-15 sec hold - 2x daily - 7x weekly  Wide Stance with Head Nods on Foam Pad - 10 reps - 1 sets - 2x daily - 7x weekly  Standing Gaze Stabilization with Head Rotation - 1 sets - 30 Seconds - 2x daily - 7x weekly  Standing Gaze Stabilization with Head Nod - 1 reps - 30 seconds - 1x daily - 7x weekly  Supine to Left Side Lying Vestibular Habituation - 5 reps - 1 sets - 1x daily - 7x weekly  Patient Education  Gaze Stabilization

## 2018-11-27 ENCOUNTER — Other Ambulatory Visit: Payer: Self-pay

## 2018-11-27 ENCOUNTER — Ambulatory Visit (INDEPENDENT_AMBULATORY_CARE_PROVIDER_SITE_OTHER): Payer: Medicare Other | Admitting: Physical Therapy

## 2018-11-27 DIAGNOSIS — R2689 Other abnormalities of gait and mobility: Secondary | ICD-10-CM

## 2018-11-27 DIAGNOSIS — R2681 Unsteadiness on feet: Secondary | ICD-10-CM

## 2018-11-27 DIAGNOSIS — M5416 Radiculopathy, lumbar region: Secondary | ICD-10-CM | POA: Diagnosis not present

## 2018-11-27 DIAGNOSIS — M7918 Myalgia, other site: Secondary | ICD-10-CM | POA: Diagnosis not present
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
## 2018-11-30 ENCOUNTER — Ambulatory Visit (INDEPENDENT_AMBULATORY_CARE_PROVIDER_SITE_OTHER): Payer: Medicare Other | Admitting: Physical Therapy

## 2018-11-30 ENCOUNTER — Other Ambulatory Visit: Payer: Self-pay

## 2018-11-30 DIAGNOSIS — R2681 Unsteadiness on feet: Secondary | ICD-10-CM | POA: Diagnosis not present

## 2018-11-30 DIAGNOSIS — R2689 Other abnormalities of gait and mobility: Secondary | ICD-10-CM
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
## 2018-12-04 ENCOUNTER — Encounter: Payer: Medicare Other | Admitting: Physical Therapy

## 2018-12-05 ENCOUNTER — Ambulatory Visit: Payer: Medicare Other | Admitting: Family Medicine

## 2018-12-05 DIAGNOSIS — M5416 Radiculopathy, lumbar region: Secondary | ICD-10-CM | POA: Diagnosis not present

## 2018-12-05 DIAGNOSIS — M7918 Myalgia, other site: Secondary | ICD-10-CM | POA: Diagnosis not present

## 2018-12-06 ENCOUNTER — Encounter: Payer: Self-pay | Admitting: Family Medicine

## 2018-12-06 ENCOUNTER — Ambulatory Visit (INDEPENDENT_AMBULATORY_CARE_PROVIDER_SITE_OTHER): Payer: Medicare Other | Admitting: Family Medicine

## 2018-12-06 ENCOUNTER — Other Ambulatory Visit: Payer: Self-pay

## 2018-12-06 VITALS — BP 110/60 | HR 82 | Temp 98.6°F | Wt 187.0 lb

## 2018-12-06 DIAGNOSIS — Z7409 Other reduced mobility: Secondary | ICD-10-CM

## 2018-12-06 DIAGNOSIS — S060X0A Concussion without loss of consciousness, initial encounter: Secondary | ICD-10-CM
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
## 2018-12-06 NOTE — Patient Instructions (Signed)
Thank you for coming in today. Continue physical therapy.  Recheck with me as needed.  Follow up with Evlyn Clines as normal.  I am right here with you if you need me.

## 2018-12-07 ENCOUNTER — Encounter: Payer: Self-pay | Admitting: Physical Therapy

## 2018-12-07 ENCOUNTER — Ambulatory Visit (INDEPENDENT_AMBULATORY_CARE_PROVIDER_SITE_OTHER): Payer: Medicare Other | Admitting: Physical Therapy

## 2018-12-07 VITALS — BP 92/59

## 2018-12-07 DIAGNOSIS — H8111 Benign paroxysmal vertigo, right ear: Secondary | ICD-10-CM

## 2018-12-07 DIAGNOSIS — R2681 Unsteadiness on feet: Secondary | ICD-10-CM

## 2018-12-07 DIAGNOSIS — R42 Dizziness and giddiness: Secondary | ICD-10-CM

## 2018-12-07 DIAGNOSIS — R2689 Other abnormalities of gait and mobility: Secondary | ICD-10-CM | POA: Diagnosis not present

## 2018-12-07 NOTE — Patient Instructions (Signed)
Access Code: [email or web address]  URL: [email or web address]  Date: 12/07/2018  Prepared by: Faustino Congress   Exercises  Wide Stance with Eyes Closed on Foam Pad - 5 reps - 1 sets - 10-15 sec hold - 2x daily - 7x weekly  Wide Stance with Head Nods on Foam Pad - 10 reps - 1 sets - 2x daily - 7x weekly  Standing Gaze Stabilization with Head Rotation - 1 sets - 30 Seconds - 2x daily - 7x weekly  Standing Gaze Stabilization with Head Nod - 1 reps - 30 seconds - 1x daily - 7x weekly  Supine to Left Side Lying Vestibular Habituation - 5 reps - 1 sets - 1x daily - 7x weekly  Patient Education  Gaze Stabilization
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
## 2018-12-11 ENCOUNTER — Encounter: Payer: Self-pay | Admitting: Physical Therapy

## 2018-12-11 ENCOUNTER — Ambulatory Visit (INDEPENDENT_AMBULATORY_CARE_PROVIDER_SITE_OTHER): Payer: Medicare Other | Admitting: Physical Therapy

## 2018-12-11 ENCOUNTER — Other Ambulatory Visit: Payer: Self-pay

## 2018-12-11 DIAGNOSIS — R42 Dizziness and giddiness: Secondary | ICD-10-CM

## 2018-12-11 DIAGNOSIS — R2689 Other abnormalities of gait and mobility: Secondary | ICD-10-CM

## 2018-12-11 DIAGNOSIS — M7918 Myalgia, other site: Secondary | ICD-10-CM | POA: Diagnosis not present

## 2018-12-11 DIAGNOSIS — R2681 Unsteadiness on feet: Secondary | ICD-10-CM

## 2018-12-11 DIAGNOSIS — M5416 Radiculopathy, lumbar region: Secondary | ICD-10-CM | POA: Diagnosis not present

## 2018-12-11 DIAGNOSIS — H8111 Benign paroxysmal vertigo, right ear: Secondary | ICD-10-CM | POA: Diagnosis not present
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
## 2018-12-13 ENCOUNTER — Other Ambulatory Visit: Payer: Self-pay

## 2018-12-13 ENCOUNTER — Encounter: Payer: Self-pay | Admitting: Physical Therapy

## 2018-12-13 ENCOUNTER — Ambulatory Visit (INDEPENDENT_AMBULATORY_CARE_PROVIDER_SITE_OTHER): Payer: Medicare Other | Admitting: Physical Therapy

## 2018-12-13 DIAGNOSIS — R2689 Other abnormalities of gait and mobility: Secondary | ICD-10-CM

## 2018-12-13 DIAGNOSIS — R42 Dizziness and giddiness: Secondary | ICD-10-CM | POA: Diagnosis not present

## 2018-12-13 DIAGNOSIS — H8111 Benign paroxysmal vertigo, right ear: Secondary | ICD-10-CM

## 2018-12-13 DIAGNOSIS — R2681 Unsteadiness on feet: Secondary | ICD-10-CM | POA: Diagnosis not present
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
## 2018-12-18 ENCOUNTER — Other Ambulatory Visit: Payer: Self-pay

## 2018-12-18 ENCOUNTER — Encounter: Payer: Self-pay | Admitting: Physical Therapy

## 2018-12-18 ENCOUNTER — Ambulatory Visit (INDEPENDENT_AMBULATORY_CARE_PROVIDER_SITE_OTHER): Payer: Medicare Other | Admitting: Physical Therapy

## 2018-12-18 VITALS — BP 103/58

## 2018-12-18 DIAGNOSIS — R2681 Unsteadiness on feet: Secondary | ICD-10-CM

## 2018-12-18 DIAGNOSIS — R2689 Other abnormalities of gait and mobility: Secondary | ICD-10-CM

## 2018-12-18 DIAGNOSIS — M7918 Myalgia, other site: Secondary | ICD-10-CM | POA: Diagnosis not present

## 2018-12-18 DIAGNOSIS — R42 Dizziness and giddiness: Secondary | ICD-10-CM

## 2018-12-18 DIAGNOSIS — H8111 Benign paroxysmal vertigo, right ear: Secondary | ICD-10-CM | POA: Diagnosis not present

## 2018-12-18 DIAGNOSIS — M5416 Radiculopathy, lumbar region: Secondary | ICD-10-CM | POA: Diagnosis not present

## 2018-12-18 NOTE — Patient Instructions (Signed)
Access Code: [email or web address]  URL: [email or web address]  Date: 12/18/2018  Prepared by: Faustino Congress   Exercises  Romberg Stance Eyes Closed on Foam Pad - 5 reps - 1 sets - 10-15 sec hold - 2x daily - 7x weekly  Feet Together Balance with Head Nods - 2 reps - 1 sets - 10 Rotations Each Way - 1x daily - 7x weekly  Standing Gaze Stabilization with Head Rotation - 1 sets - 30 Seconds - 2x daily - 7x weekly  Standing Gaze Stabilization with Head Nod - 1 reps - 30 seconds - 1x daily - 7x weekly  Supine to Left Side Lying Vestibular Habituation - 5 reps - 1 sets - 1x daily - 7x weekly  Patient Education  Gaze Stabilization
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
## 2018-12-20 ENCOUNTER — Ambulatory Visit (INDEPENDENT_AMBULATORY_CARE_PROVIDER_SITE_OTHER): Payer: Medicare Other | Admitting: Physical Therapy

## 2018-12-20 ENCOUNTER — Other Ambulatory Visit: Payer: Self-pay

## 2018-12-20 ENCOUNTER — Encounter: Payer: Self-pay | Admitting: Physical Therapy

## 2018-12-20 DIAGNOSIS — R2689 Other abnormalities of gait and mobility: Secondary | ICD-10-CM | POA: Diagnosis not present

## 2018-12-20 DIAGNOSIS — R2681 Unsteadiness on feet: Secondary | ICD-10-CM

## 2018-12-20 DIAGNOSIS — R42 Dizziness and giddiness: Secondary | ICD-10-CM

## 2018-12-20 DIAGNOSIS — H8111 Benign paroxysmal vertigo, right ear: Secondary | ICD-10-CM | POA: Diagnosis not present
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
## 2019-01-10 ENCOUNTER — Encounter: Payer: Medicare Other | Admitting: Physical Therapy

## 2019-01-17 ENCOUNTER — Other Ambulatory Visit: Payer: Self-pay

## 2019-01-17 ENCOUNTER — Encounter: Payer: Self-pay | Admitting: Physical Therapy

## 2019-01-17 ENCOUNTER — Ambulatory Visit (INDEPENDENT_AMBULATORY_CARE_PROVIDER_SITE_OTHER): Payer: Medicare Other | Admitting: Physical Therapy

## 2019-01-17 DIAGNOSIS — H8111 Benign paroxysmal vertigo, right ear: Secondary | ICD-10-CM | POA: Diagnosis not present

## 2019-01-17 DIAGNOSIS — R2689 Other abnormalities of gait and mobility: Secondary | ICD-10-CM | POA: Diagnosis not present

## 2019-01-17 DIAGNOSIS — R2681 Unsteadiness on feet: Secondary | ICD-10-CM

## 2019-01-17 DIAGNOSIS — R42 Dizziness and giddiness: Secondary | ICD-10-CM
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
## 2019-02-11 ENCOUNTER — Emergency Department
Admission: EM | Admit: 2019-02-11 | Discharge: 2019-02-11 | Discharge status: Absent without leave | Payer: Medicare Other | Source: Home / Self Care

## 2019-02-11 ENCOUNTER — Other Ambulatory Visit: Payer: Self-pay

## 2019-02-11 DIAGNOSIS — Z20828 Contact with and (suspected) exposure to other viral communicable diseases: Secondary | ICD-10-CM | POA: Diagnosis not present

## 2019-02-11 DIAGNOSIS — B349 Viral infection, unspecified: Secondary | ICD-10-CM | POA: Diagnosis not present

## 2019-02-11 DIAGNOSIS — R0981 Nasal congestion: Secondary | ICD-10-CM | POA: Diagnosis not present

## 2019-02-11 DIAGNOSIS — J22 Unspecified acute lower respiratory infection: Secondary | ICD-10-CM | POA: Diagnosis not present

## 2019-02-11 DIAGNOSIS — J029 Acute pharyngitis, unspecified: Secondary | ICD-10-CM | POA: Diagnosis not present

## 2019-02-20 DIAGNOSIS — Z23 Encounter for immunization: Secondary | ICD-10-CM | POA: Diagnosis not present

## 2019-03-22 ENCOUNTER — Ambulatory Visit (INDEPENDENT_AMBULATORY_CARE_PROVIDER_SITE_OTHER): Payer: Medicare Other | Admitting: Sports Medicine

## 2019-03-22 ENCOUNTER — Encounter: Payer: Self-pay | Admitting: Sports Medicine

## 2019-03-22 DIAGNOSIS — M5481 Occipital neuralgia: Secondary | ICD-10-CM | POA: Diagnosis not present

## 2019-03-22 MED ORDER — PREDNISONE 50 MG PO TABS: ORAL_TABLET | ORAL | 0 refills | Status: DC

## 2019-03-22 NOTE — Patient Instructions (Signed)
Occipital Neuralgia  Occipital neuralgia is a type of headache that causes brief episodes of very bad pain in the back of your head. Pain from occipital neuralgia may spread (radiate) to other parts of your head. These headaches may be caused by irritation of the nerves that leave your spinal cord high up in your neck, just below the base of your skull (occipital nerves). Your occipital nerves transmit sensations from the back of your head, the top of your head, and the areas behind your ears. What are the causes? This condition can occur without any known cause (primary headache syndrome). In other cases, this condition is caused by pressure on or irritation of one of the two occipital nerves. Pressure and irritation may be due to:  Muscle spasm in the neck.  Neck injury.  Wear and tear of the vertebrae in the neck (osteoarthritis).  Disease of the disks that separate the vertebrae.  Swollen blood vessels that put pressure on the occipital nerves.  Infections.  Tumors.  Diabetes. What are the signs or symptoms? This condition causes brief burning, stabbing, electric, shocking, or shooting pain which can radiate to the top of the head. It can happen on one side or both sides of the head. It can also cause:  Pain behind the eye.  Pain triggered by neck movement or hair brushing.  Scalp tenderness.  Aching in the back of the head between episodes of very bad pain.  Pain gets worse with exposure to bright lights. How is this diagnosed? There is no test that diagnoses this condition. Your health care provider may diagnose this condition based on a physical exam and your symptoms. Other tests may be done, such as:  Imaging studies of the brain and neck (cervical spine), such as an MRI or CT scan. These look for causes of pinched nerves.  Applying pressure to the nerves in the neck to try to re-create the pain.  Injection of numbing medicine into the occipital nerve areas to see if  pain goes away (diagnostic nerve block). How is this treated? Treatment for this condition may begin with simple measures, such as:  Rest.  Massage.  Applying heat or cold on the area.  Over-the-counter pain relievers. If these measures do not work, you may need other treatments, including:  Medicines, such as: ? Prescription-strength anti-inflammatory medicines. ? Muscle relaxants. ? Anti-seizure medicines, which can relieve pain. ? Antidepressants, which can relieve pain. ? Injected medicines, such as medicines that numb the area (local anesthetic) and steroids.  Pulsed radiofrequency ablation. This is when wires are implanted to deliver electrical impulses that block pain signals from the occipital nerve.  Surgery to relieve nerve pressure.  Physical therapy. Follow these instructions at home: Pain management      Avoid any activities that cause pain.  Rest when you have an attack of pain.  Try gentle massage to relieve pain.  Try a different pillow or sleeping position.  If directed, apply heat to the affected area as told by your health care provider. Use the heat source that your health care provider recommends, such as a moist heat pack or a heating pad. ? Place a towel between your skin and the heat source. ? Leave the heat on for 20-30 minutes. ? Remove the heat if your skin turns bright red. This is especially important if you are unable to feel pain, heat, or cold. You may have a greater risk of getting burned.  If directed, apply ice to the   back of the head and neck area as told by your health care provider. ? Put ice in a plastic bag. ? Place a towel between your skin and the bag. ? Leave the ice on for 20 minutes, 2-3 times per day. General instructions  Take over-the-counter and prescription medicines only as told by your health care provider.  Avoid things that make your symptoms worse, such as bright lights.  Try to stay active. Get regular  exercise that does not cause pain. Ask your health care provider to suggest safe exercises for you.  Work with a physical therapist to learn stretching exercises you can do at home.  Practice good posture.  Keep all follow-up visits as told by your health care provider. This is important. Contact a health care provider if:  Your medicine is not working.  You have new or worsening symptoms. Get help right away if:  You have very bad head pain that does not go away.  You have a sudden change in vision, balance, or speech. Summary  Occipital neuralgia is a type of headache that causes brief episodes of very bad pain in the back of your head.  Pain from occipital neuralgia may spread (radiate) to other parts of your head.  Treatment for this condition includes rest, massage, and medicines. This information is not intended to replace advice given to you by your health care provider. Make sure you discuss any questions you have with your health care provider. Document Released: 05/03/2001 Document Revised: 04/25/2017 Document Reviewed: 07/14/2016 Elsevier Patient Education  2020 [employer].

## 2019-03-22 NOTE — Assessment & Plan Note (Addendum)
Starting with 5 days of prednisone, if this fails I will do a greater occipital nerve block. Return to see me in 3 to 4 weeks. If persistence of symptoms in spite of an occipital nerve block we will consider further evaluation of his cervical spine.

## 2019-03-22 NOTE — Progress Notes (Signed)
Subjective:    CC: Tingling on left side of head  HPI: Mark Holland is a very pleasant 85 year old male, for the past several weeks he has had tingling on the left side of his head radiating from the back of his neck.  He does have a history of cervical DDD, also endorses symptoms going down to the left upper shoulder.  No constitutional symptoms, no trauma.  Symptoms are mild, persistent.  I reviewed the past medical history, family history, social history, surgical history, and allergies today and no changes were needed.  Please see the problem list section below in epic for further details.  Past Medical History: Past Medical History:  Diagnosis Date  . BPH (benign prostatic hyperplasia)   . Chronic back pain   . Concussion 02/2017  . COPD (chronic obstructive pulmonary disease) ([place])   . Coronary artery disease   . Depression   . Fall 02/2017  . Former smoker, stopped smoking in distant past   . Gout   . Hearing loss   . Hypertension   . Osteoporosis   . Scoliosis   . Sleep apnea   . Spinal stenosis   . TIA (transient ischemic attack) 2013  . Valvular disease    Past Surgical History: Past Surgical History:  Procedure Laterality Date  . HERNIA REPAIR Left    inguinal  . LAMINECTOMY  2009  . PERCUTANEOUS CORONARY STENT INTERVENTION (PCI-S)  2006  . prostate seed implant  2009  . REMOVE AND REPLACE LENS     Social History: Social History   Socioeconomic History  . Marital status: Single    Spouse name: Not on file  . Number of children: Not on file  . Years of education: Not on file  . Highest education level: Not on file  Occupational History  . Occupation: retired  Scientific laboratory technician  . Financial resource strain: Not on file  . Food insecurity    Worry: Not on file    Inability: Not on file  . Transportation needs    Medical: Not on file    Non-medical: Not on file  Tobacco Use  . Smoking status: Former Smoker    Packs/day: 1.00    Years: 35.00    Pack years:  35.00    Types: Cigarettes  . Smokeless tobacco: Never Used  . Tobacco comment: quit 1993  Substance and Sexual Activity  . Alcohol use: Yes    Alcohol/week: 1.0 standard drinks    Types: 1 Glasses of wine per week  . Drug use: No  . Sexual activity: Not Currently  Lifestyle  . Physical activity    Days per week: Not on file    Minutes per session: Not on file  . Stress: Not on file  Relationships  . Social Herbalist on phone: Not on file    Gets together: Not on file    Attends religious service: Not on file    Active member of club or organization: Not on file    Attends meetings of clubs or organizations: Not on file    Relationship status: Not on file  Other Topics Concern  . Not on file  Social History Narrative  . Not on file   Family History: Family History  Problem Relation Age of Onset  . Heart failure Mother   . Cancer Mother   . Heart disease Father   . Heart attack Father   . Cancer Sister    Allergies: Allergies  Allergen  Reactions  . Doxycycline Nausea And Vomiting and Nausea Only  . Sulfa Antibiotics Rash  . Lisinopril Other (See Comments)    cough   Medications: See med rec.  Review of Systems: No fevers, chills, night sweats, weight loss, chest pain, or shortness of breath.   Objective:    General: Well Developed, well nourished, and in no acute distress.  Neuro: Alert and oriented x3, extra-ocular muscles intact, sensation grossly intact.  HEENT: Normocephalic, atraumatic, pupils equal round reactive to light, neck supple, no masses, no lymphadenopathy, thyroid nonpalpable.  Skin: Warm and dry, no rashes. Cardiac: Regular rate and rhythm, no murmurs rubs or gallops, no lower extremity edema.  Respiratory: Clear to auscultation bilaterally. Not using accessory muscles, speaking in full sentences. Neck: Negative spurling's Full neck range of motion Grip strength and sensation normal in bilateral hands Strength good C4 to T1  distribution No sensory change to C4 to T1 Reflexes normal  Impression and Recommendations:    Cervico-occipital neuralgia of left side Starting with 5 days of prednisone, if this fails I will do a greater occipital nerve block. Return to see me in 3 to 4 weeks. If persistence of symptoms in spite of an occipital nerve block we will consider further evaluation of his cervical spine.   ___________________________________________ Gwen Her. Dianah Field, M.D., ABFM., CAQSM. Primary Care and Sports Medicine [facility]  Adjunct Professor of [place] [employer] [facility] [employer]

## 2019-03-25 ENCOUNTER — Telehealth: Payer: Self-pay | Admitting: Sports Medicine

## 2019-03-25 NOTE — Telephone Encounter (Signed)
Patient is aware and will finish the medicine. He did not have any other questions. He will call back if symptoms get worse.

## 2019-03-25 NOTE — Telephone Encounter (Signed)
Definitely finish the last couple doses.

## 2019-03-25 NOTE — Telephone Encounter (Signed)
Patient was prescribed prednisone on Friday and has started having some diarrhea and no other symptoms since this morning. He wants to know if he should continue the last couple of doses. Please advise.

## 2019-04-09 ENCOUNTER — Ambulatory Visit (INDEPENDENT_AMBULATORY_CARE_PROVIDER_SITE_OTHER): Payer: Medicare Other | Admitting: Sports Medicine

## 2019-04-09 ENCOUNTER — Encounter: Payer: Self-pay | Admitting: Sports Medicine

## 2019-04-09 ENCOUNTER — Other Ambulatory Visit: Payer: Self-pay

## 2019-04-09 DIAGNOSIS — M5481 Occipital neuralgia: Secondary | ICD-10-CM

## 2019-04-09 NOTE — Progress Notes (Signed)
Subjective:    CC: Follow-up  HPI: Mark Holland is a pleasant 85 year old male, we treated him for left posterior neck and head pain, suspected to be greater occipital neuralgia, he did have significant cervical DDD, he responded extremely well to prednisone, and is symptom-free.  I reviewed the past medical history, family history, social history, surgical history, and allergies today and no changes were needed.  Please see the problem list section below in epic for further details.  Past Medical History: Past Medical History:  Diagnosis Date  . BPH (benign prostatic hyperplasia)   . Chronic back pain   . Concussion 02/2017  . COPD (chronic obstructive pulmonary disease) ([place])   . Coronary artery disease   . Depression   . Fall 02/2017  . Former smoker, stopped smoking in distant past   . Gout   . Hearing loss   . Hypertension   . Osteoporosis   . Scoliosis   . Sleep apnea   . Spinal stenosis   . TIA (transient ischemic attack) 2013  . Valvular disease    Past Surgical History: Past Surgical History:  Procedure Laterality Date  . HERNIA REPAIR Left    inguinal  . LAMINECTOMY  2009  . PERCUTANEOUS CORONARY STENT INTERVENTION (PCI-S)  2006  . prostate seed implant  2009  . REMOVE AND REPLACE LENS     Social History: Social History   Socioeconomic History  . Marital status: Single    Spouse name: Not on file  . Number of children: Not on file  . Years of education: Not on file  . Highest education level: Not on file  Occupational History  . Occupation: retired  Scientific laboratory technician  . Financial resource strain: Not on file  . Food insecurity    Worry: Not on file    Inability: Not on file  . Transportation needs    Medical: Not on file    Non-medical: Not on file  Tobacco Use  . Smoking status: Former Smoker    Packs/day: 1.00    Years: 35.00    Pack years: 35.00    Types: Cigarettes  . Smokeless tobacco: Never Used  . Tobacco comment: quit 1993  Substance and  Sexual Activity  . Alcohol use: Yes    Alcohol/week: 1.0 standard drinks    Types: 1 Glasses of wine per week  . Drug use: No  . Sexual activity: Not Currently  Lifestyle  . Physical activity    Days per week: Not on file    Minutes per session: Not on file  . Stress: Not on file  Relationships  . Social Herbalist on phone: Not on file    Gets together: Not on file    Attends religious service: Not on file    Active member of club or organization: Not on file    Attends meetings of clubs or organizations: Not on file    Relationship status: Not on file  Other Topics Concern  . Not on file  Social History Narrative  . Not on file   Family History: Family History  Problem Relation Age of Onset  . Heart failure Mother   . Cancer Mother   . Heart disease Father   . Heart attack Father   . Cancer Sister    Allergies: Allergies  Allergen Reactions  . Doxycycline Nausea And Vomiting and Nausea Only  . Sulfa Antibiotics Rash  . Lisinopril Other (See Comments)    cough   Medications:  See med rec.  Review of Systems: No fevers, chills, night sweats, weight loss, chest pain, or shortness of breath.   Objective:    General: Well Developed, well nourished, and in no acute distress.  Neuro: Alert and oriented x3, extra-ocular muscles intact, sensation grossly intact.  HEENT: Normocephalic, atraumatic, pupils equal round reactive to light, neck supple, no masses, no lymphadenopathy, thyroid nonpalpable.  Skin: Warm and dry, no rashes. Cardiac: Regular rate and rhythm, no murmurs rubs or gallops, no lower extremity edema.  Respiratory: Clear to auscultation bilaterally. Not using accessory muscles, speaking in full sentences.  Impression and Recommendations:    Cervico-occipital neuralgia of left side Symptoms resolved with a 5-day prednisone burst. Certainly greater occipital nerve blocks as an option should this recur within the next month or so, if the  recurrence is greater than 4 to 6 months from now we can certainly do another dose of prednisone.   ___________________________________________ Gwen Her. Dianah Field, M.D., ABFM., CAQSM. Primary Care and Sports Medicine [facility]  Adjunct Professor of [place] [employer] [facility] [employer]

## 2019-04-09 NOTE — Assessment & Plan Note (Signed)
Symptoms resolved with a 5-day prednisone burst. Certainly greater occipital nerve blocks as an option should this recur within the next month or so, if the recurrence is greater than 4 to 6 months from now we can certainly do another dose of prednisone.

## 2019-04-12 ENCOUNTER — Ambulatory Visit: Payer: Medicare Other | Admitting: Sports Medicine
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
## 2022-05-24 NOTE — ED Provider Notes (Signed)
Formatting of this note is different from the original.    [facility]    ED Provider Note    Javonne Borke Napier 89 y.o. 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  Patient states that he is required blood transfusions for anemia.  Patient states he has had some shortness of breath, is on Eliquis for upper extremity DVT.  Denies any chest pain.  Denies any bright red blood per rectum dark or tarry stool denies any hematemesis.    Past Medical History:   Diagnosis Date    CAD (coronary artery disease)     DVT (deep vein thrombosis) in pregnancy     Hearing loss     Hypertension     Memory loss     Prostate CA (*)     Vision abnormalities      Past Surgical History:   Procedure Laterality Date    Carpal tunnel release Bilateral     Coronary angioplasty with stent placement      Eye surgery Bilateral      Social History     Substance and Sexual Activity   Alcohol Use No     Social History     Tobacco Use   Smoking Status Former    Packs/day: 1.00    Years: 45.00    Additional pack years: 0.00    Total pack years: 45.00    Types: Cigarettes    Start date: 29    Quit date: 1995    Years since quitting: 29.0   Smokeless Tobacco Never     E-Cigarettes    Vaping Use Never User     Start Date      Estate manager/land agent      Quit Date       Social History     Substance and Sexual Activity   Drug Use No           Allergies   Allergen Reactions    Ciprofloxacin Other    Doxycycline Nausea Only and Nausea And Vomiting    Lisinopril Other and Cough     cough  cough  cough    Other Other     Causes sneezing, itchy/runny nose  Causes sneezing, itchy/runny nose  Causes sneezing, itchy/runny nose    Propoxyphene N-Acetaminophen Other     unknown    Sulfa Antibiotics  Unknown and Nausea And Vomiting     Home Medications    ALBUTEROL SULFATE HFA (PROVENTIL,VENTOLIN,PROAIR) 108 (90 BASE) MCG/ACT INHALER    INHALE 2 PUFFS BY MOUTH EVERY 6 HOURS AS NEEDED FOR RESCUE ONLY    ASCORBIC ACID (VITAMIN C WITH ROSE HIPS) 500 MG TABLET    Take one tablet (500 mg dose) by mouth daily.    ASPIRIN 81 MG CHEWABLE TABLET    Chew one tablet (81 mg dose) by mouth.    ATROPINE SULFATE (ISOPTO ATROPINE) 1% OPHTHALMIC SOLUTION    INSTILL 1 DROP IN LEFT EYE TWICE A DAY *DILATION DROP*    CARBOXYMETHYLCELLULOSE SODIUM 0.25 % SOLN    INSTILL 1 DROP IN EACH EYE 2-4 TIMES A DAY    CARVEDILOL (COREG) 6.25 MG TABLET    Take one tablet (  6.25 mg dose) by mouth 2 (two) times daily.    COENZYME Q10 100 MG CAPSULE    Take one capsule (100 mg dose) by mouth.    DICLOFENAC SODIUM (VOLTAREN) 1 % GEL    APPLY 2 GRAMS TO AFFECTED AREA THREE TIMES A DAY AS NEEDED FOR WRIST PAIN    FERROUS SULFATE (FERROUS SULFATE) 325 (65 FE) MG TABLET    TAKE ONE TABLET BY MOUTH MONDAY, WEDNESDAY AND FRIDAY    FUROSEMIDE (LASIX) 20 MG TABLET    Take 20 mg by mouth.    HYDROCORTISONE (CORTAID) 1 % CREAM    APPLY SMALL AMOUNT TO AFFECTED AREA TWICE A DAY AS NEEDED TO LEFT SHOULDER    LOSARTAN POTASSIUM (COZAAR) 25 MG TABLET    Take one tablet (25 mg dose) by mouth daily.    LOSARTAN POTASSIUM (COZAAR) 50 MG TABLET    one tablet (50 mg dose).    MULTIPLE VITAMIN (MULTIVITAMIN) TABLET    Take one tablet by mouth daily.    MULTIPLE VITAMINS-MINERALS (PRESERVISION AREDS 2+MULTI VIT PO)    Take by mouth daily.    PANTOPRAZOLE SODIUM (PROTONIX) 40 MG TABLET    one tablet (40 mg dose).    PREDNISOLONE ACETATE (PRED FORTE,ECONOPRED) 1% OPHTHALMIC SUSPENSION    INSTILL 1 DROP IN LEFT EYE TWICE A DAY SHAKE BOTTLE BEFORE USE    RIVAROXABAN (XARELTO) STARTER PACK    Take 15 mg twice daily with food (42 tablets total) for the first 21 days, then 20 mg once daily with food (9 tablets total) for Days 22-30. Start taking 20 mg a day only after the 15  mg twice a day tablets are gone. Do not take both together    ROSUVASTATIN CALCIUM (CRESTOR) 20 MG TABLET    Take one half tablet (10 mg dose) by mouth.    SIMBRINZA 1-0.2 % OPHTHALMIC SOLUTION    Place one drop (0.05 mLs dose) into the left eye 3 (three) times a day.    TAMSULOSIN (FLOMAX) 0.4 MG CAPS    TAKE 1 CAPSULE BY MOUTH AT BEDTIME    TIOTROPIUM-OLODATEROL (STIOLTO RESPIMAT) 2.5-2.5 MCG/ACTUATION INHALER    Inhale into the lungs.     Review of Systems     Review of Systems   Constitutional:  Positive for fatigue. Negative for chills and fever.   Respiratory:  Positive for shortness of breath. Negative for cough.    Cardiovascular:  Negative for chest pain and palpitations.   Gastrointestinal:  Negative for abdominal pain, anal bleeding, blood in stool, nausea and vomiting.   Genitourinary:  Negative for dysuria.   Skin:  Negative for color change and rash.   All other systems reviewed and are negative.    Physical Exam     ED Triage Vitals [05/24/22 1053]   BP 109/63   Heart Rate 73   Resp 16   SpO2 99 %   Temp 97.9 F (36.6 C)     Physical Exam   Nursing note and vitals reviewed.  Constitutional: He appears well-developed. He does not appear distressed, does not appear ill and no respiratory distress.   HENT:   Head: Atraumatic.   Mouth/Throat: Voice normal.   Eyes: Conjunctivae are normal. Pupils are equal, round, and reactive to light. Right eye: no drainage. no conjunctival injection. Left eye: no drainage. no conjunctival injection.   Neck: Voice normal.   Cardiovascular: Normal rate and regular rhythm.   Pulmonary/Chest: No respiratory distress. Not tachypneic. Respiratory effort  normal.   Abdominal: Soft. There is no abdominal tenderness. There is no guarding and no rebound. Abdomen not distended.   Musculoskeletal: Normal range of motion. no edema.    Neurological: He is alert and oriented to person, place, and time.   Skin: Skin is warm. Skin is dry.   Psychiatric: He has a normal mood and affect.  His behavior is normal. Judgment and thought content normal.     ED Course     Lab results:    CBC AND DIFFERENTIAL - Abnormal       Result Value    WBC 4.9 (*)     RBC 2.60 (*)     HGB 8.1 (*)     HCT 24.9 (*)     MCV 96      MCH 31.2      MCHC 32.5      Plt Ct 76 (*)     Comment: Platelet count confirmed on smear    RDW SD 55.4 (*)     MPV 13.1 (*)     NRBC% 0.0      NRBC 0.000     COMPREHENSIVE METABOLIC PANEL - Abnormal    Na 142      Potassium 4.8      Cl 109 (*)     CO2 23      AGAP 10      Glucose 135 (*)     BUN 38 (*)     Creatinine 1.30 (*)     Ca 9.4      ALK PHOS 56      T Bili 0.30      Total Protein 6.0      Alb 3.8      GLOBULIN 2.2      ALBUMIN/GLOBULIN RATIO 1.7      BUN/CREAT RATIO 29.2 (*)     ALT 14      AST 20      eGFR 53      Comment: Normal GFR (glomerular filtration rate) > 60 mL/min/1.73 meters squared, < 60 may include impaired kidney function.  Calculation based on the Chronic Kidney Disease Epidemiology Collaboration (CK-EPI)equation refit without adjustment for race.   MANUAL DIFFERENTIAL - Abnormal    Seg Neutrophils Man 72 (*)     LYMPH MAN 13 (*)     MONO MAN 3 (*)     Band Man 10 (*)     LYMPH ATYPICAL 2 (*)     Neutro Abs Man 4.02      Lymph Abs Man 0.6 (*)     Mono Abs Man 0.1      PLATELET EST Decreased (*)    NT-PROBNP - Abnormal    NT-ProBNP 4,255 (*)     Comment: Among patients with dyspnea, NT-proBNPis highly  sensitive for the detection of acute congestive heart  failure.  In addition, a NT-proBNP <300 pg/mL  effectively rules out acute congestive heart failure,  with 98% negative predictive value.    TYPE AND SCREEN    ABO Rh Type A POS      Antibody Screen NEG     ABO RH CONFIRMATION    ABO Rh Type A POS       Imaging:    XR CHEST AP PORTABLE    Narrative:     COMPARISON:  None  INDICATION: Shortness of breath    TECHNIQUE:  XR CHEST AP PORTABLE -  Exam date/time:  05/24/2022 1:50 PM  FINDINGS:   #  No focal infiltrate or evidence of acute pulmonary edema. No pneumothorax  or definite pleural effusion. Heart size within normal limits. Right-sided Port-A-Cath with tip at the level of the SVC.     Impression:     IMPRESSION:  1.  No acute findings.     Electronically Signed by: Evelina Bucy, MD on 05/24/2022 2:40 PM     ECG:  ECG Results    None                                        Pre-Sedation  Procedures    ED Course as of 05/24/22 1507   Jacelyn Pi Goncharow's Documentation   Tue May 24, 2022   1213 Platelet Estimate(!): Decreased   1213 Platelet Count(!): 76   1259 I discussed with patient's hematologist oncologist at the [facility], no clinical indication for transfusion at this time.  They are able to order echocardiogram as an outpatient.   1303 BNP elevated, after speaking with hematology oncologist concern for heart failure secondary due to chemotherapy medications.   1304   Will discuss with medicine team for admission.     Medical Decision Making  89 year old male to the emergency department by hematology oncologist for possible need for blood transfusion has been having dyspnea on exertion, also considered CHF.  I discussed with patient's hematologist oncologist, hemoglobin 8.1, there was some concern that the chemotherapy medication could be causing heart issues, discussed with medicine team and patient and family they feel comfortable with outpatient follow-up hematology oncologist will arrange outpatient echocardiogram.  Discussed strict the return precautions.    Amount and/or Complexity of Data Reviewed  Labs: ordered. Decision-making details documented in ED Course.  Radiology: ordered.    Provider Communication    New Prescriptions    No medications on file     Modified Medications    No medications on file     Discontinued Medications    No medications on file     Clinical Impression     Final diagnoses:   Dyspnea on exertion     ED Disposition       ED Disposition   Discharge    Condition   Stable    Comment   --                    Follow-up Information       [place].    Contact informationKathryne Sharper [place] [id number]  [phone]           [facility] Emergency Department.    Specialty: Emergency Medicine  Comments: As needed, If symptoms worsen  Contact information:  [address] [facility] [place] [address]  [phone]                    Electronically signed by:      Anice Paganini, DO  05/24/22 1507    Electronically signed by Anice Paganini, DO at 05/24/2022  3:07 PM EST

## 2022-10-24 NOTE — Assessment & Plan Note (Signed)
Associated Problem(s): Right hydrocele  Formatting of this note might be different from the original.   observing  Electronically signed by Theodosia Blender, MD at 10/24/2022 12:04 PM EDT

## 2022-10-24 NOTE — Progress Notes (Signed)
Formatting of this note is different from the original.  Subjective     Patient ID:  Paul Medina is a 89 y.o.(DOB [birth year]) male with a history of the following:    Problem   Right Hydrocele   Benign Prostatic Hyperplasia With Urinary Obstruction     Scrotal pain    Paul Medina presents today with a chief complaint of:     Patient presents with    Benign Prostatic Hypertrophy     Scrotal pain    he was last seen: 09/09/21  Notes from this encounter were reviewed and summarized below.    Today he has concerns of: BPH and testicle pain  This is not a new problem. It is felt to be not improved.    Related to this are the following factors:  Location: prostate and testis  Severity: no longer having testis pain, has BPH as well as right hydrocele  Current Pain: 0/10  Timing/Duration: as above  Modifying Factors: as above  Associated Signs and Symptoms: listed below    Here today to discuss hydrocele and BPH which is a long term/chronic issue    He has seen Drs. Paul Medina and Paul Medina in past for this and comes in for follow-up on these problems today  Thinks he is having some hot flashes  He has a history of prostate cancer and has [facility]  He thinks his prostate cancer was treated with radiation up in [place]  He thinks his PSA was checked recently and was normal    Denies pain with urination  Denies hematuria  Has good stream on Flomax  At last visit with Paul Medina was offered removal of scrotal lesion which he declined  Has continued to have some intermittent irritation    Past Medical History:  Past Medical History:   Diagnosis Date    CAD (coronary artery disease)     DVT (deep vein thrombosis) in pregnancy     Hearing loss     Hypertension     Memory loss     Prostate CA (*)     Vision abnormalities      Past Surgical History:  Past Surgical History:   Procedure Laterality Date    Carpal tunnel release Bilateral     Coronary angioplasty with stent placement       Eye surgery Bilateral      Medications:  Current Outpatient Medications on File Prior to Visit   Medication Sig Dispense Refill    albuterol sulfate HFA (PROVENTIL,VENTOLIN,PROAIR) 108 (90 Base) MCG/ACT inhaler INHALE 2 PUFFS BY MOUTH EVERY 6 HOURS AS NEEDED FOR RESCUE ONLY      Ascorbic Acid (VITAMIN C WITH ROSE HIPS) 500 MG tablet Take one tablet (500 mg dose) by mouth daily.      aspirin 81 mg chewable tablet Chew one tablet (81 mg dose) by mouth.      atropine sulfate (ISOPTO ATROPINE) 1% ophthalmic solution INSTILL 1 DROP IN LEFT EYE TWICE A DAY *DILATION DROP*      Carboxymethylcellulose Sodium 0.25 % SOLN INSTILL 1 DROP IN EACH EYE 2-4 TIMES A DAY      carvedilol (COREG) 6.25 mg tablet Take one tablet (6.25 mg dose) by mouth 2 (two) times daily. 180 tablet 3    Coenzyme Q10 100 MG capsule Take one capsule (100 mg dose) by mouth.      diclofenac sodium (VOLTAREN) 1 % gel APPLY 2 GRAMS  TO AFFECTED AREA THREE TIMES A DAY AS NEEDED FOR WRIST PAIN      ferrous sulfate (FERROUS SULFATE) 325 (65 FE) MG tablet TAKE ONE TABLET BY MOUTH MONDAY, WEDNESDAY AND FRIDAY      furosemide (LASIX) 20 mg tablet Take 20 mg by mouth.      Multiple Vitamin (MULTIVITAMIN) tablet Take one tablet by mouth daily.      pantoprazole sodium (PROTONIX) 40 mg tablet one tablet (40 mg dose).      prednisoLONE acetate (PRED FORTE,ECONOPRED) 1% ophthalmic suspension INSTILL 1 DROP IN LEFT EYE TWICE A DAY SHAKE BOTTLE BEFORE USE      rivaroxaban (XARELTO) starter pack Take 15 mg twice daily with food (42 tablets total) for the first 21 days, then 20 mg once daily with food (9 tablets total) for Days 22-30. Start taking 20 mg a day only after the 15 mg twice a day tablets are gone. Do not take both together 51 tablet 0    rosuvastatin calcium (CRESTOR) 20 mg tablet Take one half tablet (10 mg dose) by mouth.      SIMBRINZA 1-0.2 % ophthalmic solution Place one drop (0.05 mLs dose) into the left eye 3 (three) times a day.       tiotropium-olodaterol (STIOLTO RESPIMAT) 2.5-2.5 mcg/actuation inhaler Inhale into the lungs.       No current facility-administered medications on file prior to visit.     Allergies:  Ciprofloxacin, Lisinopril, Other, Propoxyphene n-acetaminophen, and Sulfa antibiotics    Family History, and Social History were reviewed and updated as appropriate.    Review of Systems:    The following symptoms are positive if in BOLD, otherwise negative.  Chest Pain  Shortness of Breath  Weight Loss  Bone pain  Nausea/Vomiting/Constipation/Diarrhea  Fevers/Chills    All systems reviewed and otherwise negative except as documented above.       Objective     BP 114/63   Pulse 70   Temp 97.7 F (36.5 C) (Temporal)   Ht 5\' 6"  (1.676 m) Comment: per record  Wt 170 lb (77.1 kg) Comment: pt repored  SpO2 98%   BMI 27.44 kg/m     Physical Exam    BMI was reviewed and is not elevated. Patient provided instruction to maintain regular exercise to stay within a healthy BMI.    CONSTITUTIONAL: This a 89 y.o. male in no acute distress. He is accompanied by nobody today.  PSYCHOLOGIC:  Awake, alert and oriented to person, place and time. Normal mood and affect  DERMATOLOGIC: Skin warm and dry  RESPIRATORY: Respiratory effort normal  CARDIOVASCULAR:  No cyanosis  GASTROINTESTINAL: Abdomen is non-distended   NEUROLOGIC: no gross motor dysfunction. He does not use a walker or wheelchair to assist ambulation  GENITOURINARY:    - Kidneys normal.    - Left CVA is nontender   - Right CVA is nontender   - Bladder non-tender and not distended.   - papillary lesion on right scrotum. Non-tender. No erythema. Mild scrotal swelling on right consistent with hydrocele. Uncircumcised penis      ECOG PERFORMANCE STATUS: 2  0 Fully active, able to carry on all pre-disease performance without restriction  1 Restricted in physically strenuous activity but ambulatory and able to carry out work of a light or sedentary nature, e.g., light house work, office  work  2 Ambulatory and capable of all selfcare but unable to carry out any work activities; up and about more than 50% of waking  hours  3 Capable of only limited selfcare; confined to bed or chair more than 50% of waking hours  4 Completely disabled; cannot carry on any selfcare; totally confined to bed or chair    Labs/Radiology     No visits with results within 1 Day(s) from this visit.   Latest known visit with results is:   Hospital Outpatient Visit on 06/21/2022   Component Date Value Ref Range Status    Glucose, POC 06/21/2022 79  70 - 99 mg/dL Final    OPERATOR ID [id number]   Final    INSTRUMENT ID 06/21/2022 [id number]   Final     Notable Labs reviewed:   Lab Results   Component Value Date    Creatinine 1.30 (H) 05/24/2022     Creatinine (mg/dL)   Date Value   40/34/7425 1.30 (H)   04/22/2022 1.03   03/22/2017 1.08     No results found for: "TESTOSTERONE"    No results found for: "PSA"    Radiology:    No results found.    Bladder Ultrasound Post Void Residuals:  Date Volume (mL)           No results found for this or any previous visit.      Assessment and Plan   Macklan Guagliardo is a 89 y.o. male with the following diagnoses:    1. Epididymo-orchitis    2. Prostate cancer (*)    3. Benign prostatic hyperplasia with urinary obstruction    4. Right hydrocele      Problem   Right Hydrocele   Benign Prostatic Hyperplasia With Urinary Obstruction     Our plan is as follows:    Kody was seen today for benign prostatic hypertrophy.    Diagnoses and all orders for this visit:    Epididymo-orchitis  -     Urinalysis; Future    Prostate cancer (*)  -     PSA; Future    Benign prostatic hyperplasia with urinary obstruction    Right hydrocele    Other orders  -     tamsulosin (FLOMAX) 0.4 mg CAPS; TAKE 1 CAPSULE BY MOUTH AT BEDTIME    Continue Flomax  Declines excision of scrotal lesion  Observe hydrocele  Advised to check PSA but [facility]    Orders Placed This Encounter    Procedures    Urinalysis    PSA     Risks, benefits, and alternatives of the medications and treatment plan prescribed today were discussed, and patient expressed understanding.  Patient and or his  family expresses understanding and all questions and concerns were answered. The patient is in agreement with the plan as stated above.    Portions of the note were entered using voice recognition software.  Minor syntax, contextual, and spelling errors may be related to the use of this software and were not intentional.  If corrections are necessary, please contact provider.      Electronically signed by Paul Blender, MD at 10/24/2022 12:07 PM EDT

## 2022-10-24 NOTE — Assessment & Plan Note (Signed)
Associated Problem(s): Benign prostatic hyperplasia with urinary obstruction  Formatting of this note might be different from the original.   Flomax  Electronically signed by Theodosia Blender, MD at 10/24/2022 12:04 PM EDT

## 2022-10-24 NOTE — Addendum Note (Signed)
Addended by: Lewis Moccasin on: 10/24/2022 12:16 PM     Modules accepted: Orders      Electronically signed by Lewis Moccasin, CMA at 10/24/2022 12:16 PM EDT

## 2023-04-24 NOTE — Case Communication (Addendum)
 Message sent to Radiologist asking to read CT scan. There is no imaging from Chest from [place] prior scans. Pt asks to be notified of results once available.     Dr. Nedra Hai, radiologist reports there is multiple imaging reports. RN asked Lonna Duval Tech to look

## 2023-04-24 NOTE — Case Communication (Addendum)
 12/3 : Left VM for pt that results are in his chart for CT scan, and these have been passed along to Dr. Rulon Abide to review. Dr. Rulon Abide would like pt to have telehealth to review. Scheduling notified

## 2023-05-02 NOTE — Telephone Encounter (Signed)
 Pt was on voice mail asking for sooner appointment. He is very SOB, and can barely talk. I did move up appointment to 05/11/23. I advised him to go to the ER for extreme SOB. He states he will go if he gets worse. He does not want to run up a bill. I advis

## 2023-05-09 NOTE — Progress Notes (Signed)
 [facility]  [address]  [place], [place] [address]  Phone- [phone]    OFFICE VISIT NOTE    Patient Name: Paul Medina  Age: 89 y.o.  Sex: male  MRN: [id number]  Date: 05/09/2023  PCP: Pcp, Declined    Reason for Visit: estab

## 2023-05-11 ENCOUNTER — Ambulatory Visit
Admit: 2023-05-11 | Discharge: 2023-05-11 | Payer: PRIVATE HEALTH INSURANCE | Attending: Pulmonary Disease | Admitting: Pulmonary Disease | Primary: Diagnostic Radiology

## 2023-05-11 VITALS — BP 130/80 | HR 70 | Temp 98.00000°F | Resp 20 | Ht 66.0 in | Wt 184.0 lb

## 2023-05-11 DIAGNOSIS — J449 Chronic obstructive pulmonary disease, unspecified: Principal | ICD-10-CM

## 2023-05-11 LAB — SPIROMETRY WITHOUT BRONCHODILATOR
FEV1 %Pred-Pre: 62 %
FEV1/FVC: 60 %
FEV1: 1.39 L
FVC %Pred-Pre: 75 %
FVC: 2.33 L

## 2023-05-11 MED ORDER — ALBUTEROL SULFATE HFA 108 (90 BASE) MCG/ACT IN AERS
108 | RESPIRATORY_TRACT | 11 refills | Status: AC | PRN
Start: 2023-05-11 — End: ?

## 2023-05-11 MED ORDER — TIOTROPIUM BROMIDE-OLODATEROL 2.5-2.5 MCG/ACT IN AERS
2.5-2.5 | Freq: Every day | RESPIRATORY_TRACT | 11 refills | 6.00000 days | Status: DC
Start: 2023-05-11 — End: 2024-02-08

## 2023-05-11 NOTE — Progress Notes (Signed)
 Name:  Timithy Arons  Date of Birth:  08-Jan-1931   MRN: [id number]      Office Visit: 05/11/2023       Assessment & Plan (Medical Decision Making)    Impression: 89 y.o. 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 Chronic obstructive pulmo

## 2023-05-16 ENCOUNTER — Encounter: Payer: PRIVATE HEALTH INSURANCE | Attending: Nurse Practitioner | Primary: Family Medicine

## 2023-06-20 ENCOUNTER — Emergency Department: Admit: 2023-06-20 | Payer: PRIVATE HEALTH INSURANCE | Primary: Diagnostic Radiology

## 2023-06-20 ENCOUNTER — Inpatient Hospital Stay
Admit: 2023-06-20 | Discharge: 2023-06-20 | Disposition: A | Discharge status: Home / Self Care | Payer: PRIVATE HEALTH INSURANCE | Attending: General Practice

## 2023-06-20 DIAGNOSIS — S0990XA Unspecified injury of head, initial encounter: Secondary | ICD-10-CM

## 2023-06-20 DIAGNOSIS — R5383 Other fatigue: Secondary | ICD-10-CM

## 2023-06-20 LAB — URINALYSIS
Bilirubin, Urine: NEGATIVE
Glucose, Ur: NEGATIVE mg/dL
Ketones, Urine: NEGATIVE mg/dL
Leukocyte Esterase, Urine: NEGATIVE
Nitrite, Urine: NEGATIVE
Protein, UA: 30 mg/dL — AB
Specific Gravity, UA: 1.02 (ref 1.001–1.023)
Urobilinogen, Urine: 0.2 U/dL (ref 0.2–1.0)
pH, Urine: 6 (ref 5.0–9.0)

## 2023-06-20 LAB — CBC WITH AUTO DIFFERENTIAL
Basophils %: 0.4 % (ref 0.0–2.0)
Basophils Absolute: 0.02 10*3/uL (ref 0.00–0.20)
Eosinophils %: 0 % — ABNORMAL LOW (ref 0.5–7.8)
Eosinophils Absolute: 0 10*3/uL (ref 0.00–0.80)
Hematocrit: 32.1 % — ABNORMAL LOW (ref 41.1–50.3)
Hemoglobin: 10.9 g/dL — ABNORMAL LOW (ref 13.6–17.2)
Immature Granulocytes %: 2.1 % (ref 0.0–5.0)
Immature Granulocytes Absolute: 0.11 10*3/uL (ref 0.0–0.5)
Lymphocytes %: 20.6 % (ref 13.0–44.0)
Lymphocytes Absolute: 1.1 10*3/uL (ref 0.50–4.60)
MCH: 29.7 pg (ref 26.1–32.9)
MCHC: 34 g/dL (ref 31.4–35.0)
MCV: 87.5 fL (ref 82.0–102.0)
MPV: UNDETERMINED fL (ref 9.4–12.3)
Monocytes %: 24.3 % — ABNORMAL HIGH (ref 4.0–12.0)
Monocytes Absolute: 1.3 10*3/uL (ref 0.10–1.30)
Neutrophils %: 52.6 % (ref 43.0–78.0)
Neutrophils Absolute: 2.81 10*3/uL (ref 1.70–8.20)
Platelets: 70 10*3/uL — ABNORMAL LOW (ref 150–450)
RBC: 3.67 M/uL — ABNORMAL LOW (ref 4.23–5.60)
RDW: 15.5 % — ABNORMAL HIGH (ref 11.9–14.6)
WBC: 5.3 10*3/uL (ref 4.3–11.1)
nRBC: 0 10*3/uL (ref 0.0–0.2)

## 2023-06-20 LAB — COMPREHENSIVE METABOLIC PANEL
ALT: 25 U/L (ref 12–65)
AST: 36 U/L (ref 15–37)
Albumin/Globulin Ratio: 1.6 (ref 1.0–1.9)
Albumin: 4 g/dL (ref 3.2–4.6)
Alk Phosphatase: 63 U/L (ref 40–129)
Anion Gap: 11 mmol/L (ref 7–16)
BUN: 26 mg/dL — ABNORMAL HIGH (ref 8–23)
CO2: 21 mmol/L (ref 20–29)
Calcium: 8.8 mg/dL (ref 8.8–10.2)
Chloride: 107 mmol/L (ref 98–107)
Creatinine: 1.42 mg/dL — ABNORMAL HIGH (ref 0.80–1.30)
Est, Glom Filt Rate: 47 mL/min/{1.73_m2} — ABNORMAL LOW (ref >60)
Globulin: 2.5 g/dL (ref 2.3–3.5)
Glucose: 125 mg/dL — ABNORMAL HIGH (ref 65–100)
Potassium: 4 mmol/L (ref 3.5–5.1)
Sodium: 139 mmol/L (ref 133–143)
Total Bilirubin: 0.5 mg/dL (ref 0.0–1.2)
Total Protein: 6.5 g/dL (ref 6.3–8.2)

## 2023-06-20 LAB — URINALYSIS, MICRO
Mucus, UA: 0 /[LPF]
WBC, UA: 0 /[HPF]

## 2023-06-20 LAB — INFLUENZA A/B, MOLECULAR
Influenza A, NAA: NOT DETECTED
Influenza B, NAA: NOT DETECTED

## 2023-06-20 NOTE — ED Provider Notes (Signed)
Emergency Department Provider Note       PCP: Unknown, Provider, ANP   Age: 89 y.o.   Sex: male     DISPOSITION Decision To Discharge 06/20/2023 01:11:07 PM   DISPOSITION CONDITION Stable            ICD-10-CM    1. Closed head injury, initial encounter  S09.90XA       2. Fall, initial encounter  W19.XXXA       3. Other fatigue  R53.83           Medical Decision Making     Patient presents with a fall that he says was from fatigue.  Patient has nothing to suggest intracranial hemorrhage, skull fracture, cervical spine fracture, or any other traumatic injury from the fall.  Regarding the fatigue.  There is nothing to suggest dehydration, acute kidney injury, sepsis, pneumonia, UTI, influenza, or any other emergent pathology.  Patient remained stable here.  Suspect some other viral syndrome.  He is taking azithromycin provided by primary.  Patient will need to follow-up with primary and return precautions are given.     1 acute illness with systemic symptoms.  Patient was discharged risks and benefits of hospitalization were considered.  Chronic medical problems impacting care include hypertension.  Shared medical decision making was utilized in creating the patients health plan today.    I independently ordered and reviewed each unique test.  I reviewed external records: provider visit note from outside specialist.  I reviewed external records: previous lab results from outside ED.  I reviewed external records: previous imaging study including radiologist interpretation.     I interpreted the X-rays chest x-ray shows no evidence of infiltrate or edema and heart size is normal.  I have reviewed and agree with radiology report.  I interpreted the CT Scan CT scan of the brain shows no evidence of hemorrhage or infarct or skull fracture.  I have reviewed and agree with radiology report.              History     Patient presents with a fall.  Patient states he was feeling very tired and fatigued and fell back and hit  the back of his head.  He was trying to back in bed.  He has continued to feel fatigued.  Has had some cough and congestion.  Apparently the paramedic today told him that he may have a low-grade fever.  Was started on azithromycin yesterday for cough and congestion but did not get an x-ray.  Only takes a baby aspirin no other blood thinner.  Denies any palpitations or chest pain.        ROS     Review of Systems   All other systems reviewed and are negative.       Physical Exam     Vitals signs and nursing note reviewed:  Vitals:    06/20/23 1028 06/20/23 1100 06/20/23 1200   BP: (!) 147/74 (!) 146/75    Pulse: 75 79    Resp: 17 19    Temp: 99 F (37.2 C)     TempSrc: Oral     SpO2: 96% 96% 95%   Weight: 85.8 kg (189 lb 3.2 oz)     Height: 1.676 m (5\' 6" )        Physical Exam  Constitutional:       General: He is not in acute distress.  HENT:      Head: Normocephalic and atraumatic.  Right Ear: External ear normal.      Left Ear: External ear normal.      Nose: No congestion or rhinorrhea.      Mouth/Throat:      Mouth: Mucous membranes are moist.   Eyes:      Extraocular Movements: Extraocular movements intact.      Pupils: Pupils are equal, round, and reactive to light.   Cardiovascular:      Rate and Rhythm: Normal rate and regular rhythm.      Heart sounds: Normal heart sounds.   Pulmonary:      Effort: Pulmonary effort is normal.      Breath sounds: Normal breath sounds.   Abdominal:      General: There is no distension.      Palpations: Abdomen is soft.      Tenderness: There is no abdominal tenderness.   Musculoskeletal:         General: No swelling or tenderness. Normal range of motion.      Cervical back: Normal range of motion.   Skin:     Findings: No rash.   Neurological:      General: No focal deficit present.      Mental Status: He is alert and oriented to person, place, and time.   Psychiatric:         Mood and Affect: Mood normal.        Procedures     Procedures    Orders Placed This Encounter    Procedures    Influenza A/B, Molecular    CT Head W/O Contrast    XR CHEST PORTABLE    CBC with Auto Differential    CMP    Urinalysis    Urinalysis, Micro        Medications given during this emergency department visit:  Medications - No data to display    New Prescriptions    No medications on file        Past Medical History:   Diagnosis Date    Arthritis     Asthma     Chronic bronchitis (HCC)     Hypertension     Lung cancer (HCC)         History reviewed. No pertinent surgical history.     Social History     Socioeconomic History    Marital status: Single     Spouse name: None    Number of children: None    Years of education: None    Highest education level: None   Tobacco Use    Smoking status: Former     Current packs/day: 0.00     Average packs/day: 1 pack/day for 34.0 years (34.0 ttl pk-yrs)     Types: Cigarettes     Start date: 05/31/1954     Quit date: 1990     Years since quitting: 35.0    Smokeless tobacco: Never   Substance and Sexual Activity    Alcohol use: Never    Drug use: Never    Sexual activity: Not Currently     Partners: Female     Social Determinants of Health     Financial Resource Strain: Low Risk  (10/21/2022)    Received from [facility]    Overall Financial Resource Strain (CARDIA)     Difficulty of Paying Living Expenses: Not very hard   Food Insecurity: No Food Insecurity (10/21/2022)    Received from [facility]    Hunger Vital Sign  Worried About Programme researcher, broadcasting/film/video in the Last Year: Never true     Ran Out of Food in the Last Year: Never true   Transportation Needs: No Transportation Needs (10/21/2022)    Received from [facility] - Transportation     Lack of Transportation (Medical): No     Lack of Transportation (Non-Medical): No   Physical Activity: Unknown (10/21/2022)    Received from [facility]    Exercise Vital Sign     Days of Exercise per Week: 0 days   Stress: Stress Concern Present (10/21/2022)    Received from [facility] - Occupational Stress Questionnaire     Feeling of Stress : To some extent   Social Connections: Unknown (02/23/2023)    Received from [facility]    Social Connections     Frequency of Communication with Friends and Family: Not asked     Frequency of Social Gatherings with Friends and Family: Not asked   Intimate Partner Violence: Unknown (02/23/2023)    Received from [facility]    Intimate Partner Violence     Fear of Current or Ex-Partner: Not asked     Emotionally Abused: Not asked     Physically Abused: Not asked     Sexually Abused: Not asked        Previous Medications    ALBUTEROL SULFATE HFA (PROVENTIL;VENTOLIN;PROAIR) 108 (90 BASE) MCG/ACT INHALER    Inhale 2 puffs into the lungs every 4 hours as needed for Shortness of Breath    ASCORBIC ACID (VITAMIN C) 500 MG TABLET    Take 1 tablet by mouth daily    LACTOBACILLUS (CULTURELLE) CAPS CAPSULE    Take 1 capsule by mouth daily    LIDOCAINE (LIDODERM) 5 %    Place 1 patch onto the skin daily    LORATADINE (CLARITIN) 10 MG TABLET    Take 1 tablet by mouth daily    LOSARTAN (COZAAR) 25 MG TABLET    1 tablet daily    MULTIPLE VITAMIN (MULTIVITAMIN) TABLET    Take 1 tablet by mouth daily    PANTOPRAZOLE (PROTONIX) 40 MG TABLET    Take 1 tablet by mouth daily    PREDNISOLONE ACETATE (PRED FORTE) 1 % OPHTHALMIC SUSPENSION    1 drop 2 times daily    ROSUVASTATIN (CRESTOR) 10 MG TABLET    3 tablets daily    SILDENAFIL (VIAGRA) 100 MG TABLET    Take 1 tablet by mouth as needed    TAMSULOSIN (FLOMAX) 0.4 MG CAPSULE    Take 1 capsule by mouth daily    TERAZOSIN (HYTRIN) 5 MG CAPSULE    2 capsules daily    TIOTROPIUM-OLODATEROL (STIOLTO) 2.5-2.5 MCG/ACT AERS    Inhale 2 puffs into the lungs daily    VALSARTAN (DIOVAN) 40 MG TABLET    Take 1 tablet by mouth 2 times daily        Results for orders placed or performed during the hospital encounter of 06/20/23   Influenza A/B, Molecular    Specimen: Nasopharyngeal   Result Value Ref Range    Influenza  A, NAA Not detected NOTD      Influenza B, NAA Not detected NOTD     CT Head W/O Contrast    Narrative    Head CT    INDICATION: Trauma    TECHNIQUE: Multiple 2D axial images obtained through the brain without  intravenous  contrast.  Radiation dose reduction techniques were used for this  study:  All CT scans performed at this facility use one or all of the following:  Automated exposure control, adjustment of the mA and/or kVp according to  patient's size, iterative reconstruction.    COMPARISON: None    FINDINGS: Chronic findings. No areas of abnormal attenuation are seen in the  brain. There is no CT evidence of acute hemorrhage or infarction. The ventricles  are normal in size. There are no extra-axial fluid collections. No masses are  seen. The sinuses are clear. There are no bony lesions.      Impression    No CT evidence of acute intracranial abnormality.    Electronically signed by Lala Lund   XR CHEST PORTABLE    Narrative    Chest X-ray    INDICATION: Mild congestion, fall      COMPARISON: 05/24/2022      AP/PA view of the chest was obtained.    FINDINGS: Right jugular Infuse-a-Port on prior study has been removed. There is  no pneumothorax. There are no areas of airspace consolidation. There is no  effusion or pneumothorax. The heart is not enlarged. No acute osseous  abnormality is evident..        Impression    No acute findings in the chest      Electronically signed by Sandi Mealy   CBC with Auto Differential   Result Value Ref Range    WBC 5.3 4.3 - 11.1 K/uL    RBC 3.67 (L) 4.23 - 5.60 M/uL    Hemoglobin 10.9 (L) 13.6 - 17.2 g/dL    Hematocrit 16.1 (L) 41.1 - 50.3 %    MCV 87.5 82.0 - 102.0 FL    MCH 29.7 26.1 - 32.9 PG    MCHC 34.0 31.4 - 35.0 g/dL    RDW 09.6 (H) 04.5 - 14.6 %    Platelets 70 (L) 150 - 450 K/uL    MPV Unable to calculate. Recommend adding IPF. 9.4 - 12.3 FL    nRBC 0.00 0.0 - 0.2 K/uL    Differential Type AUTOMATED      Neutrophils % 52.6 43.0 - 78.0 %    Lymphocytes % 20.6  13.0 - 44.0 %    Monocytes % 24.3 (H) 4.0 - 12.0 %    Eosinophils % 0.0 (L) 0.5 - 7.8 %    Basophils % 0.4 0.0 - 2.0 %    Immature Granulocytes % 2.1 0.0 - 5.0 %    Neutrophils Absolute 2.81 1.70 - 8.20 K/UL    Lymphocytes Absolute 1.10 0.50 - 4.60 K/UL    Monocytes Absolute 1.30 0.10 - 1.30 K/UL    Eosinophils Absolute 0.00 0.00 - 0.80 K/UL    Basophils Absolute 0.02 0.00 - 0.20 K/UL    Immature Granulocytes Absolute 0.11 0.0 - 0.5 K/UL   CMP   Result Value Ref Range    Sodium 139 133 - 143 mmol/L    Potassium 4.0 3.5 - 5.1 mmol/L    Chloride 107 98 - 107 mmol/L    CO2 21 20 - 29 mmol/L    Anion Gap 11 7 - 16 mmol/L    Glucose 125 (H) 65 - 100 mg/dL    BUN 26 (H) 8 - 23 MG/DL    Creatinine 4.09 (H) 0.80 - 1.30 MG/DL    Est, Glom Filt Rate 47 (L) >60 ml/min/1.74m2    Calcium 8.8 8.8 - 10.2 MG/DL  Total Bilirubin 0.5 0.0 - 1.2 MG/DL    ALT 25 12 - 65 U/L    AST 36 15 - 37 U/L    Alk Phosphatase 63 40 - 129 U/L    Total Protein 6.5 6.3 - 8.2 g/dL    Albumin 4.0 3.2 - 4.6 g/dL    Globulin 2.5 2.3 - 3.5 g/dL    Albumin/Globulin Ratio 1.6 1.0 - 1.9     Urinalysis   Result Value Ref Range    Color, UA YELLOW      Appearance CLEAR      Specific Gravity, UA 1.020 1.001 - 1.023      pH, Urine 6.0 5.0 - 9.0      Protein, UA 30 (A) NEG mg/dL    Glucose, Ur Negative NEG mg/dL    Ketones, Urine Negative NEG mg/dL    Bilirubin, Urine Negative NEG      Blood, Urine Trace Intact (A) NEG      Urobilinogen, Urine 0.2 0.2 - 1.0 EU/dL    Nitrite, Urine Negative NEG      Leukocyte Esterase, Urine Negative NEG     Urinalysis, Micro   Result Value Ref Range    WBC, UA 0 0 /hpf    RBC, UA 0-3 0 /hpf    Epithelial Cells, UA 0-3 0 /hpf    BACTERIA, URINE 1+ (H) 0 /hpf    Mucus, UA 0 0 /lpf    Other observations RESULTS VERIFIED MANUALLY           CT Head W/O Contrast   Final Result   No CT evidence of acute intracranial abnormality.      Electronically signed by Lala Lund      XR CHEST PORTABLE   Final Result   No acute findings in the  chest         Electronically signed by Sandi Mealy                   No results for input(s): "COVID19" in the last 72 hours.    Voice dictation software was used during the making of this note.  This software is not perfect and grammatical and other typographical errors may be present.  This note has not been completely proofread for errors.     Sandi Raveling, DO  06/20/23 1320

## 2023-06-20 NOTE — ED Notes (Signed)
Bedside and Verbal shift change report given to Paul Medina, Charity fundraiser (Cabin crew) by Hermenia Bers, RN (offgoing nurse). Report included the following information Nurse Handoff Report. Patient resting comfortably in the stretcher. Son at bedside. Patient has received numerous blankets for comfort.

## 2023-06-20 NOTE — ED Notes (Signed)
Patient mobility status  with no difficulty.     I have reviewed discharge instructions with the patient.  The patient verbalized understanding.    Patient left ED via Discharge Method: wheelchair to Home with Child.    Opportunity for questions and clarification provided.     Patient given 0 scripts.

## 2023-06-20 NOTE — ED Triage Notes (Signed)
Pt presents to ED via [facility] from [facility] facility with c/o mechanical fall at 0300 while trying to go to the bathroom. Pt has been feeling very fatigued, states he just started azithromycin. Mild congestion. Pt states he did hit his head but denies LOC. Takes 81 mg asa.

## 2023-06-28 ENCOUNTER — Emergency Department: Admit: 2023-06-28 | Payer: PRIVATE HEALTH INSURANCE | Primary: Diagnostic Radiology

## 2023-06-28 ENCOUNTER — Inpatient Hospital Stay
Admission: EM | Admit: 2023-06-28 | Discharge: 2023-07-03 | Disposition: A | Discharge status: Home Health | Payer: PRIVATE HEALTH INSURANCE | Admitting: Student in an Organized Health Care Education/Training Program

## 2023-06-28 DIAGNOSIS — U071 COVID-19: Secondary | ICD-10-CM

## 2023-06-28 LAB — TROPONIN
Troponin T: 64 ng/L — ABNORMAL HIGH (ref 0–22)
Troponin T: 68 ng/L — ABNORMAL HIGH (ref 0–22)

## 2023-06-28 LAB — CBC WITH AUTO DIFFERENTIAL
Basophils %: 0.5 % (ref 0.0–2.0)
Basophils Absolute: 0.02 10*3/uL (ref 0.00–0.20)
Eosinophils %: 0 % — ABNORMAL LOW (ref 0.5–7.8)
Eosinophils Absolute: 0 10*3/uL (ref 0.00–0.80)
Hematocrit: 31.1 % — ABNORMAL LOW (ref 41.1–50.3)
Hemoglobin: 10.7 g/dL — ABNORMAL LOW (ref 13.6–17.2)
Immature Granulocytes %: 6.4 % — ABNORMAL HIGH (ref 0.0–5.0)
Immature Granulocytes Absolute: 0.24 10*3/uL (ref 0.0–0.5)
Lymphocytes %: 23.8 % (ref 13.0–44.0)
Lymphocytes Absolute: 0.88 10*3/uL (ref 0.50–4.60)
MCH: 29.2 pg (ref 26.1–32.9)
MCHC: 34.4 g/dL (ref 31.4–35.0)
MCV: 84.7 fL (ref 82–102)
MPV: UNDETERMINED fL (ref 9.4–12.3)
Monocytes %: 20.1 % — ABNORMAL HIGH (ref 4.0–12.0)
Monocytes Absolute: 0.74 10*3/uL (ref 0.10–1.30)
Neutrophils %: 49.2 % (ref 43.0–78.0)
Neutrophils Absolute: 1.82 10*3/uL (ref 1.70–8.20)
Platelets: 105 10*3/uL — ABNORMAL LOW (ref 150–450)
RBC: 3.67 M/uL — ABNORMAL LOW (ref 4.23–5.6)
RDW: 14.9 % — ABNORMAL HIGH (ref 11.9–14.6)
WBC: 3.7 10*3/uL — ABNORMAL LOW (ref 4.3–11.1)
nRBC: 0 10*3/uL (ref 0.0–0.2)

## 2023-06-28 LAB — EKG 12-LEAD
Atrial Rate: 118 {beats}/min
Q-T Interval: 345 ms
QRS Duration: 93 ms
QTc Calculation (Bazett): 484 ms
R Axis: -10 degrees
T Axis: 80 degrees
Ventricular Rate: 118 {beats}/min

## 2023-06-28 LAB — COMPREHENSIVE METABOLIC PANEL
ALT: 25 U/L (ref 8–55)
AST: 37 U/L (ref 15–37)
Albumin/Globulin Ratio: 1.1 (ref 1.0–1.9)
Albumin: 3.4 g/dL (ref 3.2–4.6)
Alk Phosphatase: 61 U/L (ref 40–129)
Anion Gap: 16 mmol/L (ref 7–16)
BUN: 40 mg/dL — ABNORMAL HIGH (ref 8–23)
CO2: 17 mmol/L — ABNORMAL LOW (ref 20–29)
Calcium: 8.9 mg/dL (ref 8.8–10.2)
Chloride: 107 mmol/L (ref 98–107)
Creatinine: 1.66 mg/dL — ABNORMAL HIGH (ref 0.80–1.30)
Est, Glom Filt Rate: 39 mL/min/{1.73_m2} — ABNORMAL LOW (ref >60)
Globulin: 2.9 g/dL (ref 2.3–3.5)
Glucose: 98 mg/dL (ref 70–99)
Potassium: 4.4 mmol/L (ref 3.5–5.1)
Sodium: 140 mmol/L (ref 136–145)
Total Bilirubin: 0.8 mg/dL (ref 0.0–1.2)
Total Protein: 6.3 g/dL (ref 6.3–8.2)

## 2023-06-28 LAB — INFLUENZA A/B, MOLECULAR
Influenza A, NAA: NOT DETECTED
Influenza B, NAA: NOT DETECTED

## 2023-06-28 LAB — MAGNESIUM: Magnesium: 2.2 mg/dL (ref 1.8–2.4)

## 2023-06-28 LAB — COVID-19, RAPID: SARS-CoV-2, Rapid: DETECTED — AB

## 2023-06-28 LAB — BRAIN NATRIURETIC PEPTIDE: NT Pro-BNP: 2539 pg/mL — ABNORMAL HIGH (ref 0–450)

## 2023-06-28 MED ORDER — POLYETHYLENE GLYCOL 3350 17 G PO PACK
17 g | Freq: Every day | ORAL | Status: DC | PRN
Start: 2023-06-28 — End: 2023-07-03

## 2023-06-28 MED ORDER — SODIUM CHLORIDE 0.9 % IV SOLN
0.9 % | INTRAVENOUS | Status: DC | PRN
Start: 2023-06-28 — End: 2023-07-03

## 2023-06-28 MED ORDER — METHYLPREDNISOLONE SODIUM SUCC 40 MG IJ SOLR
40 MG | Freq: Three times a day (TID) | INTRAMUSCULAR | Status: DC
Start: 2023-06-28 — End: 2023-06-29
  Administered 2023-06-28 – 2023-06-29 (×3): 40 mg via INTRAVENOUS

## 2023-06-28 MED ORDER — ALBUTEROL SULFATE (2.5 MG/3ML) 0.083% IN NEBU
Freq: Four times a day (QID) | RESPIRATORY_TRACT | Status: DC | PRN
Start: 2023-06-28 — End: 2023-07-03

## 2023-06-28 MED ORDER — NORMAL SALINE FLUSH 0.9 % IV SOLN
0.9 % | Freq: Two times a day (BID) | INTRAVENOUS | Status: DC
Start: 2023-06-28 — End: 2023-07-03
  Administered 2023-06-29 – 2023-07-03 (×10): 10 mL via INTRAVENOUS

## 2023-06-28 MED ORDER — TAMSULOSIN HCL 0.4 MG PO CAPS
0.4 MG | Freq: Every day | ORAL | Status: DC
Start: 2023-06-28 — End: 2023-07-03
  Administered 2023-06-29: 14:00:00 0.4 mg via ORAL

## 2023-06-28 MED ORDER — ROSUVASTATIN CALCIUM 10 MG PO TABS
10 MG | Freq: Every evening | ORAL | Status: DC
Start: 2023-06-28 — End: 2023-07-03
  Administered 2023-06-29 – 2023-07-03 (×5): 30 mg via ORAL

## 2023-06-28 MED ORDER — SODIUM CHLORIDE 0.9 % IV BOLUS
0.9 | Freq: Once | INTRAVENOUS | Status: AC
Start: 2023-06-28 — End: 2023-06-28
  Administered 2023-06-28: 19:00:00 500 mL via INTRAVENOUS

## 2023-06-28 MED ORDER — PREDNISOLONE ACETATE 1 % OP SUSP
1 % | Freq: Every day | OPHTHALMIC | Status: DC
Start: 2023-06-28 — End: 2023-07-03
  Administered 2023-06-29 – 2023-07-03 (×5): 1 [drp] via OPHTHALMIC

## 2023-06-28 MED ORDER — CARVEDILOL 3.125 MG PO TABS
3.125 | Freq: Two times a day (BID) | ORAL | Status: DC
Start: 2023-06-28 — End: 2023-06-28

## 2023-06-28 MED ORDER — ACETAMINOPHEN 325 MG PO TABS
325 MG | Freq: Four times a day (QID) | ORAL | Status: DC | PRN
Start: 2023-06-28 — End: 2023-07-03

## 2023-06-28 MED ORDER — TIOTROPIUM BROMIDE-OLODATEROL 2.5-2.5 MCG/ACT IN AERS
2.5-2.5 | Freq: Every day | RESPIRATORY_TRACT | Status: DC
Start: 2023-06-28 — End: 2023-06-28

## 2023-06-28 MED ORDER — CULTURELLE PO CAPS
Freq: Every day | ORAL | Status: DC
Start: 2023-06-28 — End: 2023-07-03
  Administered 2023-06-29 – 2023-07-03 (×5): 1 via ORAL

## 2023-06-28 MED ORDER — METOPROLOL TARTRATE 25 MG PO TABS
25 | Freq: Four times a day (QID) | ORAL | Status: DC
Start: 2023-06-28 — End: 2023-06-28

## 2023-06-28 MED ORDER — DOXYCYCLINE HYCLATE 100 MG PO CAPS
100 | Freq: Two times a day (BID) | ORAL | Status: AC
Start: 2023-06-28 — End: 2023-07-03
  Administered 2023-06-29 – 2023-07-03 (×10): 100 mg via ORAL

## 2023-06-28 MED ORDER — BRIMONIDINE TARTRATE 0.2 % OP SOLN
0.2 % | Freq: Three times a day (TID) | OPHTHALMIC | Status: DC
Start: 2023-06-28 — End: 2023-07-03
  Administered 2023-06-28 – 2023-07-03 (×15): 1 [drp] via OPHTHALMIC

## 2023-06-28 MED ORDER — GUAIFENESIN ER 600 MG PO TB12
600 MG | Freq: Two times a day (BID) | ORAL | Status: DC
Start: 2023-06-28 — End: 2023-07-03
  Administered 2023-06-29 – 2023-07-03 (×10): 1200 mg via ORAL

## 2023-06-28 MED ORDER — ATROPINE SULFATE 1 % OP SOLN
1 % | Freq: Every day | OPHTHALMIC | Status: DC
Start: 2023-06-28 — End: 2023-07-03
  Administered 2023-06-29 – 2023-07-03 (×5): 1 [drp] via OPHTHALMIC

## 2023-06-28 MED ORDER — APIXABAN 2.5 MG PO TABS
2.5 MG | Freq: Two times a day (BID) | ORAL | Status: DC
Start: 2023-06-28 — End: 2023-07-03
  Administered 2023-06-29 – 2023-07-03 (×10): 2.5 mg via ORAL

## 2023-06-28 MED ORDER — NORMAL SALINE FLUSH 0.9 % IV SOLN
0.9 % | INTRAVENOUS | Status: DC | PRN
Start: 2023-06-28 — End: 2023-07-03

## 2023-06-28 MED ORDER — SODIUM CHLORIDE 0.9 % IV SOLN (ADD-VANTAGE)
0.9 % | INTRAVENOUS | Status: DC
Start: 2023-06-28 — End: 2023-06-30
  Administered 2023-06-28: 20:00:00 5 mg/h via INTRAVENOUS

## 2023-06-28 MED ORDER — ACETAMINOPHEN 650 MG RE SUPP
650 MG | Freq: Four times a day (QID) | RECTAL | Status: DC | PRN
Start: 2023-06-28 — End: 2023-07-03

## 2023-06-28 MED ORDER — CARVEDILOL 6.25 MG PO TABS
6.25 MG | Freq: Two times a day (BID) | ORAL | Status: DC
Start: 2023-06-28 — End: 2023-07-03
  Administered 2023-06-28 – 2023-07-03 (×10): 6.25 mg via ORAL

## 2023-06-28 MED ORDER — DOXAZOSIN MESYLATE 4 MG PO TABS
4 | Freq: Every day | ORAL | Status: DC
Start: 2023-06-28 — End: 2023-06-28

## 2023-06-28 MED ORDER — IPRATROPIUM-ALBUTEROL 0.5-2.5 (3) MG/3ML IN SOLN
0.5-2.533 | RESPIRATORY_TRACT | Status: AC
Start: 2023-06-28 — End: 2023-06-28
  Administered 2023-06-28: 19:00:00 1 via RESPIRATORY_TRACT

## 2023-06-28 MED ORDER — DILTIAZEM HCL 25 MG/5ML IV SOLN
255 | INTRAVENOUS | Status: AC
Start: 2023-06-28 — End: 2023-06-28
  Administered 2023-06-28: 20:00:00 10 mg via INTRAVENOUS

## 2023-06-28 MED ORDER — ARFORMOTEROL TARTRATE 15 MCG/2ML IN NEBU
152 MCG/2ML | Freq: Two times a day (BID) | RESPIRATORY_TRACT | Status: DC
Start: 2023-06-28 — End: 2023-07-03
  Administered 2023-06-29 – 2023-07-03 (×10): 15 ug via RESPIRATORY_TRACT

## 2023-06-28 MED ORDER — ONDANSETRON HCL 4 MG/2ML IJ SOLN
42 MG/2ML | Freq: Four times a day (QID) | INTRAMUSCULAR | Status: DC | PRN
Start: 2023-06-28 — End: 2023-07-03

## 2023-06-28 MED ORDER — PANTOPRAZOLE SODIUM 40 MG PO TBEC
40 MG | Freq: Every day | ORAL | Status: DC
Start: 2023-06-28 — End: 2023-07-03
  Administered 2023-06-28 – 2023-06-30 (×3): 40 mg via ORAL

## 2023-06-28 MED ORDER — IPRATROPIUM BROMIDE 0.02 % IN SOLN
0.02 % | Freq: Four times a day (QID) | RESPIRATORY_TRACT | Status: DC
Start: 2023-06-28 — End: 2023-06-30
  Administered 2023-06-29 – 2023-06-30 (×5): 0.5 mg via RESPIRATORY_TRACT

## 2023-06-28 MED ORDER — ASPIRIN 81 MG PO CHEW
81 | Freq: Every day | ORAL | Status: DC
Start: 2023-06-28 — End: 2023-06-28
  Administered 2023-06-28: 22:00:00 81 mg via ORAL

## 2023-06-28 MED ORDER — FUROSEMIDE 20 MG PO TABS
20 MG | Freq: Every day | ORAL | Status: DC
Start: 2023-06-28 — End: 2023-07-03
  Administered 2023-06-29 – 2023-07-03 (×5): 20 mg via ORAL

## 2023-06-28 MED ORDER — STERILE WATER FOR INJECTION (MIXTURES ONLY)
1 g | INTRAMUSCULAR | Status: AC
Start: 2023-06-28 — End: 2023-07-02
  Administered 2023-06-28 – 2023-07-02 (×5): 1000 mg via INTRAVENOUS

## 2023-06-28 MED ORDER — CETIRIZINE HCL 10 MG PO TABS
10 MG | Freq: Every day | ORAL | Status: DC
Start: 2023-06-28 — End: 2023-07-03
  Administered 2023-07-03: 15:00:00 10 mg via ORAL

## 2023-06-28 MED ORDER — ONDANSETRON 4 MG PO TBDP
4 MG | Freq: Three times a day (TID) | ORAL | Status: DC | PRN
Start: 2023-06-28 — End: 2023-07-03
  Administered 2023-06-29: 02:00:00 4 mg via ORAL

## 2023-06-28 MED FILL — METHYLPREDNISOLONE SODIUM SUCC 40 MG IJ SOLR: 40 MG | INTRAMUSCULAR | Qty: 40

## 2023-06-28 MED FILL — IPRATROPIUM-ALBUTEROL 0.5-2.5 (3) MG/3ML IN SOLN: 0.5-2.533 (3) MG/3ML | RESPIRATORY_TRACT | Qty: 3

## 2023-06-28 MED FILL — CEFTRIAXONE SODIUM 1 G IJ SOLR: 1 g | INTRAMUSCULAR | Qty: 1000

## 2023-06-28 MED FILL — TAMSULOSIN HCL 0.4 MG PO CAPS: 0.4 MG | ORAL | Qty: 1

## 2023-06-28 MED FILL — DILTIAZEM HCL 25 MG/5ML IV SOLN: 255 MG/5ML | INTRAVENOUS | Qty: 5

## 2023-06-28 MED FILL — FUROSEMIDE 20 MG PO TABS: 20 MG | ORAL | Qty: 1

## 2023-06-28 MED FILL — BRIMONIDINE TARTRATE 0.2 % OP SOLN: 0.2 % | OPHTHALMIC | Qty: 5

## 2023-06-28 MED FILL — PANTOPRAZOLE SODIUM 40 MG PO TBEC: 40 MG | ORAL | Qty: 1

## 2023-06-28 MED FILL — DILTIAZEM HCL 100 MG IV SOLR: 100 MG | INTRAVENOUS | Qty: 1

## 2023-06-28 MED FILL — IPRATROPIUM BROMIDE 0.02 % IN SOLN: 0.02 % | RESPIRATORY_TRACT | Qty: 2.5

## 2023-06-28 MED FILL — CETIRIZINE HCL 10 MG PO TABS: 10 MG | ORAL | Qty: 1

## 2023-06-28 MED FILL — ASPIRIN LOW STRENGTH 81 MG PO CHEW: 81 MG | ORAL | Qty: 1

## 2023-06-28 MED FILL — CARVEDILOL 6.25 MG PO TABS: 6.25 MG | ORAL | Qty: 1

## 2023-06-28 NOTE — H&P (Addendum)
Hospitalist History and Physical   Admit Date:  06/28/2023 10:56 AM   Name:  Paul Medina   Age:  89 y.o.  Sex:  male  DOB:  11-16-1930   MRN:  [id number]   Room:  ER33/33    Presenting/Chief Complaint: Cold Symptoms and Dehydration     Reason(s) for Admission: Atrial fibrillation with rapid ventricular response (HCC) [I48.91]     History of Present Illness:   Paul Medina is a 89 y.o. male with medical history of asthma, hypertension, sleep apnea, TIA, acid reflux, blind in his left eye, history of B-cell lymphoma.  CKD 3.      Patient says he has been feeling weaker, tired, mild dry cough going on for several weeks, recently finished a course of Zithromax about 2 weeks ago.  He also had a fall about 2 weeks ago secondary generalized weakness.  As the symptoms persisted decided to come to emergency room for further evaluation.    Apart from generalized weakness fatigue and dry cough with mild wheezing patient otherwise does not complain of any headache or dizziness or palpitation or chest pain or any nausea vomiting or abdominal pain.    WBC of 3.7, platelet of 105, hemoglobin of 10.7, bicarb of 17 creatinine 1.66, previous creatinine of 1.42, proBNP of 2539, initial troponin 64, repeat troponin of 68  EKG new A-fib with RVR, with PVCs, rate of 118  Chest x-ray-  Questionable ill-defined opacity of the retrocardiac region  without other acute cardiopulmonary finding. Dedicated PA/lateral chest would be  useful for further evaluation  Positive for COVID  For his new A-fib with RVR patient received Cardizem 10 mg IV x 1 and presently on Cardizem drip.    Patient will be admitted for asthma exasperation, pneumonia, new A-fib with RVR, mild acute on chronic kidney disease.      Assessment & Plan:     Principal Problem:    Atrial fibrillation with rapid ventricular response (HCC)  New A-fib with RVR, presently on Cardizem drip, will titrate for heart rate  Cardiology consulted with his thrombocytopenia will  hold off anticoagulation will wait for cardiology recommendation.    Pneumonia/asthma exacerbation/covid  Will start on Rocephin 1 g daily, doxycycline 100 mg twice daily, he recently finished a course of Zithromax about 2 weeks ago, Mucinex 200 mg p.o. twice daily, culturally 1 tablet p.o. daily  Solu-Medrol 40 mg Q8 hourly, albuterol nebs Q4 hourly, Pulmicort nebs twice daily    Acute on chronic kidney disease  Creatinine of 1.66, previous creatinine of 1.42  Held losartan    History of TIA  Aspirin 81 mg daily    History of B-cell lymphoma  WBC of 3.7, platelet of 105, hemoglobin of 10.7    Obstructive sleep apnea  Respiratory consult for CPAP machine    Coronary disease-patient says he was told he had cardiomegaly, never had any stent  On Coreg 3.125 mg p.o. twice daily    Hypertension  On losartan 25 mg daily, held with his creatinine of 1.66    This visit took 32 minutes  of critical care time in evaluation and management of a criticially ill patient, such that the critical illness acutely impairs cardiovascular/pulmonary systems such that there is a high probability of imminent of life-threatening deterioration in the patient's condition needing immediate attention. The time listed is exclusive an any procedures which may have been performed.  Cricital care time includes time spent at bedside performing patient interview and physcial exam, time  spent researching patient prior to interaction with patient, time spent discussing findings and treatment plan with patient and/or family, time speint discussing patient with consultants and colleagues, time spent reviewing pertinent laboratory and radiographic evaluations, time spent re-evaluating patient, or time spent discussing patient with nursing staff.          PT/OT evals ordered?  Therapy evals ordered  Diet: ADULT DIET; Regular; Low Fat/Low Chol/High Fiber/2 gm Na  VTE prophylaxis: SCD's   Code status: Full Code  Code status discussed: Yes  Blood consent  obtained: N/A      Non-peripheral Lines and Tubes (if present):             Hospital Problems:  Principal Problem:    Atrial fibrillation with rapid ventricular response (HCC)  Active Problems:    HTN (hypertension)    HLD (hyperlipidemia)    Coronary atherosclerosis of native coronary artery    TIA (transient ischemic attack)    Amaurosis fugax of left eye    OSA (obstructive sleep apnea)    Stage 3 chronic kidney disease (HCC)    COVID    Moderate asthma with exacerbation  Resolved Problems:    * No resolved hospital problems. *        Objective:   Patient Vitals for the past 24 hrs:   Temp Pulse Resp BP SpO2   06/28/23 1536 -- (!) 111 28 -- 90 %   06/28/23 1530 -- (!) 112 19 117/76 94 %   06/28/23 1515 -- (!) 110 23 122/61 94 %   06/28/23 1514 -- (!) 107 21 107/62 92 %   06/28/23 1445 -- (!) 125 14 128/84 98 %   06/28/23 1358 -- (!) 113 20 112/87 95 %   06/28/23 1343 -- (!) 106 28 122/66 96 %   06/28/23 1328 -- (!) 106 29 114/62 96 %   06/28/23 1258 -- 99 23 132/82 96 %   06/28/23 1243 -- 98 21 102/75 97 %   06/28/23 1228 -- -- -- 107/76 97 %   06/28/23 1213 -- -- -- 131/78 97 %   06/28/23 1158 -- -- -- 104/73 94 %   06/28/23 1143 -- -- -- 111/72 96 %   06/28/23 1128 -- -- -- 110/79 96 %   06/28/23 1114 -- -- -- 123/85 96 %   06/28/23 1100 98.3 F (36.8 C) 92 18 117/76 96 %       Oxygen Therapy  SpO2: 90 %  Pulse via Oximetry: 108 beats per minute  O2 Device: None (Room air)    Estimated body mass index is 29.05 kg/m as calculated from the following:    Height as of this encounter: 1.676 m (5\' 6" ).    Weight as of this encounter: 81.6 kg (180 lb).  No intake or output data in the 24 hours ending 06/28/23 1552      Physical Exam:    General:    Well nourished.    Head:  Normocephalic, atraumatic  Eyes:  Sclerae appear normal.  Pupils equally round.  ENT:  Nares appear normal.  Moist oral mucosa  Neck:  No restricted ROM.  Trachea midline   CV:   Tachycardic, irregularly irregular no m/r/g.  No jugular venous  distension.  Lungs:   Diffuse bilateral wheezing, coarse breath sounds  Abdomen:   Soft, nontender, nondistended.  Extremities: No cyanosis or clubbing.  No edema  Skin:     No rashes.  Normal coloration.   Warm  and dry.    Neuro:  CN II-XII grossly intact.  Sensation intact.   Psych:  Normal mood and affect.      Orders Placed This Encounter   Medications    ipratropium 0.5 mg-albuterol 2.5 mg (DUONEB) nebulizer solution 1 Dose     Order Specific Question:   Initiate RT Bronchodilator Protocol     Answer:   Yes - Inpatient Protocol    sodium chloride 0.9 % bolus 500 mL    dilTIAZem injection 10 mg    dilTIAZem 100 mg in sodium chloride 0.9 % 100 mL infusion (ADD-[facility])     Order Specific Question:   Titrate Infusion?     Answer:   Yes     Order Specific Question:   Initial Infusion Dose:     Answer:   5 mg/hr     Order Specific Question:   Goal of Therapy is:     Answer:   HR less than 100 bpm     Order Specific Question:   Contact Provider if:     Answer:   SBP less than 90 mmHg     Order Specific Question:   Contact Provider if:     Answer:   HR less than 60 bpm     Order Specific Question:   HOLD for     Answer:   SBP less than 90 mmHg     Order Specific Question:   HOLD for     Answer:   HR less than 60 bpm    albuterol (PROVENTIL) (2.5 MG/3ML) 0.083% nebulizer solution 2.5 mg     Order Specific Question:   Initiate RT Bronchodilator Protocol     Answer:   Yes - Inpatient Protocol    cetirizine (ZYRTEC) tablet 10 mg    pantoprazole (PROTONIX) tablet 40 mg    rosuvastatin (CRESTOR) tablet 30 mg    tamsulosin (FLOMAX) capsule 0.4 mg    DISCONTD: doxazosin (CARDURA) tablet 8 mg    DISCONTD: tiotropium-olodaterol (STIOLTO) 2.5-2.5 MCG/ACT inhaler 2 puff    sodium chloride flush 0.9 % injection 5-40 mL    sodium chloride flush 0.9 % injection 5-40 mL    0.9 % sodium chloride infusion    OR Linked Order Group     ondansetron (ZOFRAN-ODT) disintegrating tablet 4 mg     ondansetron (ZOFRAN) injection 4 mg     polyethylene glycol (GLYCOLAX) packet 17 g    OR Linked Order Group     acetaminophen (TYLENOL) tablet 650 mg     acetaminophen (TYLENOL) suppository 650 mg    carvedilol (COREG) tablet 3.125 mg    prednisoLONE acetate (PRED FORTE) 1 % ophthalmic suspension 1 drop    atropine 1 % ophthalmic solution 1 drop    brimonidine (ALPHAGAN) 0.2 % ophthalmic solution 1 drop    arformoterol tartrate (BROVANA) nebulizer solution 15 mcg    ipratropium (ATROVENT) 0.02 % nebulizer solution 0.5 mg     Order Specific Question:   Initiate RT Bronchodilator Protocol     Answer:   Yes - Inpatient Protocol    cefTRIAXone (ROCEPHIN) 1,000 mg in sterile water 10 mL IV syringe     Order Specific Question:   Antimicrobial Indications     Answer:   Other     Order Specific Question:   Antimicrobial Indications     Answer:   Pneumonia (CAP)     Order Specific Question:   Other Abx Indication     Answer:  acute bronchitis     Order Specific Question:   CAP duration of therapy     Answer:   5 days    doxycycline hyclate (VIBRAMYCIN) capsule 100 mg     Order Specific Question:   Antimicrobial Indications     Answer:   Pneumonia (CAP)     Order Specific Question:   CAP duration of therapy     Answer:   5 days    methylPREDNISolone sodium succ (SOLU-MEDROL) 40 mg in sterile water 1 mL injection       Prior to Admit Medications:  Current Outpatient Medications   Medication Instructions    albuterol sulfate HFA (PROVENTIL;VENTOLIN;PROAIR) 108 (90 Base) MCG/ACT inhaler 2 puffs, Inhalation, EVERY 4 HOURS PRN    ascorbic acid (VITAMIN C) 500 mg, Oral, DAILY    lactobacillus (CULTURELLE) CAPS capsule 1 capsule, Oral, DAILY    lidocaine (LIDODERM) 5 % 1 patch, TransDERmal, DAILY    loratadine (CLARITIN) 10 mg, Oral, DAILY    losartan (COZAAR) 25 mg, DAILY    Multiple Vitamin (MULTIVITAMIN) tablet 1 tablet, Oral, DAILY    pantoprazole (PROTONIX) 40 mg, Oral, DAILY    prednisoLONE acetate (PRED FORTE) 1 % ophthalmic suspension 1 drop, 2 TIMES DAILY     rosuvastatin (CRESTOR) 30 mg, DAILY    sildenafil (VIAGRA) 100 mg, Oral, PRN    tamsulosin (FLOMAX) 0.4 mg, Oral, DAILY    terazosin (HYTRIN) 10 mg, DAILY    tiotropium-olodaterol (STIOLTO) 2.5-2.5 MCG/ACT AERS 2 puffs, Inhalation, DAILY    valsartan (DIOVAN) 40 mg, Oral, 2 TIMES DAILY       I have personally reviewed labs and tests:  Recent Results (from the past 24 hour(s))   CBC with Auto Differential    Collection Time: 06/28/23 11:13 AM   Result Value Ref Range    WBC 3.7 (L) 4.3 - 11.1 K/uL    RBC 3.67 (L) 4.23 - 5.6 M/uL    Hemoglobin 10.7 (L) 13.6 - 17.2 g/dL    Hematocrit 60.4 (L) 41.1 - 50.3 %    MCV 84.7 82 - 102 FL    MCH 29.2 26.1 - 32.9 PG    MCHC 34.4 31.4 - 35.0 g/dL    RDW 54.0 (H) 98.1 - 14.6 %    Platelets 105 (L) 150 - 450 K/uL    MPV Unable to calculate. Recommend adding IPF. 9.4 - 12.3 FL    nRBC 0.00 0.0 - 0.2 K/uL    Neutrophils % 49.2 43.0 - 78.0 %    Lymphocytes % 23.8 13.0 - 44.0 %    Monocytes % 20.1 (H) 4.0 - 12.0 %    Eosinophils % 0.0 (L) 0.5 - 7.8 %    Basophils % 0.5 0.0 - 2.0 %    Immature Granulocytes % 6.4 (H) 0.0 - 5.0 %    Neutrophils Absolute 1.82 1.70 - 8.20 K/UL    Lymphocytes Absolute 0.88 0.50 - 4.60 K/UL    Monocytes Absolute 0.74 0.10 - 1.30 K/UL    Eosinophils Absolute 0.00 0.00 - 0.80 K/UL    Basophils Absolute 0.02 0.00 - 0.20 K/UL    Immature Granulocytes Absolute 0.24 0.0 - 0.5 K/UL    RBC Comment SLIGHT  ANISOCYTOSIS + POIKILOCYTOSIS        RBC Comment OCCASIONAL  SCHISTOCYTES        WBC Comment Result Confirmed By Smear      Platelet Comment SLIGHT      Differential Type AUTOMATED  CMP    Collection Time: 06/28/23 11:13 AM   Result Value Ref Range    Sodium 140 136 - 145 mmol/L    Potassium 4.4 3.5 - 5.1 mmol/L    Chloride 107 98 - 107 mmol/L    CO2 17 (L) 20 - 29 mmol/L    Anion Gap 16 7 - 16 mmol/L    Glucose 98 70 - 99 mg/dL    BUN 40 (H) 8 - 23 MG/DL    Creatinine 4.09 (H) 0.80 - 1.30 MG/DL    Est, Glom Filt Rate 39 (L) >60 ml/min/1.86m2    Calcium 8.9 8.8 -  10.2 MG/DL    Total Bilirubin 0.8 0.0 - 1.2 MG/DL    ALT 25 8 - 55 U/L    AST 37 15 - 37 U/L    Alk Phosphatase 61 40 - 129 U/L    Total Protein 6.3 6.3 - 8.2 g/dL    Albumin 3.4 3.2 - 4.6 g/dL    Globulin 2.9 2.3 - 3.5 g/dL    Albumin/Globulin Ratio 1.1 1.0 - 1.9     Magnesium    Collection Time: 06/28/23 11:13 AM   Result Value Ref Range    Magnesium 2.2 1.8 - 2.4 mg/dL   Troponin    Collection Time: 06/28/23 11:13 AM   Result Value Ref Range    Troponin T 64.0 (H) 0 - 22 ng/L   COVID-19, Rapid    Collection Time: 06/28/23 12:15 PM    Specimen: Nasopharyngeal   Result Value Ref Range    Source NASAL      SARS-CoV-2, Rapid Detected (A) NOTD     Influenza A/B, Molecular    Collection Time: 06/28/23 12:16 PM    Specimen: Nasopharyngeal   Result Value Ref Range    Influenza A, NAA Not detected NOTD      Influenza B, NAA Not detected NOTD     EKG 12 Lead    Collection Time: 06/28/23 12:34 PM   Result Value Ref Range    Ventricular Rate 118 BPM    Atrial Rate 118 BPM    QRS Duration 93 ms    Q-T Interval 345 ms    QTc Calculation (Bazett) 484 ms    R Axis -10 degrees    T Axis 80 degrees    Diagnosis       Atrial fibrillation with premature ventricular or aberrantly conducted   complexes  Borderline prolonged QT interval    Confirmed by Tonny Bollman MD, Christiane Ha [phone]) on 06/28/2023 1:39:31 PM     Troponin    Collection Time: 06/28/23  2:03 PM   Result Value Ref Range    Troponin T 68.0 (H) 0 - 22 ng/L   Brain Natriuretic Peptide    Collection Time: 06/28/23  2:03 PM   Result Value Ref Range    NT Pro-BNP 2,539 (H) 0 - 450 PG/ML       Recent Labs     06/28/23  1215   COVID19 Detected*       XR CHEST PORTABLE    Result Date: 06/28/2023  Chest X-ray INDICATION: Cough COMPARISON: 20 June 2023 TECHNIQUE: AP view of the chest was obtained.     Findings/impression: Questionable ill-defined opacity of the retrocardiac region without other acute cardiopulmonary finding. Dedicated PA/lateral chest would be useful for further  evaluation. Electronically signed by Gifford Shave        Signed:  Britta Mccreedy, MD

## 2023-06-28 NOTE — ED Provider Notes (Signed)
Emergency Department Provider Note       PCP: Unknown, Provider, ANP   Age: 89 y.o.   Sex: male     DISPOSITION Decision To Admit 06/28/2023 02:10:07 PM    ICD-10-CM    1. COVID-19 virus infection  U07.1       2. Dehydration  E86.0       3. New onset atrial fibrillation (HCC)  I48.91       4. AKI (acute kidney injury) (HCC)  N17.9           Medical Decision Making     89 year old male with history of hypertension, CAD status post PCI, hyperlipidemia, lung cancer, chronic bronchitis, asthma, CHF with mildly reduced ejection fraction, diffuse large B-cell lymphoma status posttreatment presents with complaint of worsening cough, congestion, wheezing that is progressed over the past month. Facility also concern for dehydration given decreased oral intake.  Creatinine on 05/18/2023 1.25.  Today creatinine 1.66.  Bicarb 17.  Potassium 4.4.  LFTs unremarkable.  WBC 3.7.  H&H stable.  Platelet count 105.  Rapid COVID-positive.  Chest x-ray with questionable ill-defined opacity in the retrocardiac region.  Symptoms most consistent w/ COVID.   Patient also with new onset A-fib.  Initial troponin 64.  Repeat troponin 68.  proBNP 2539.  Patient given DuoNeb.  Patient given 500 mL normal saline IV fluid bolus.    On reassessment heart rate in the 130s.  Cardizem 10 mg IV followed by Cardizem infusion ordered.   Hospitalist consulted for admission via [facility].  Case discussed with Dr. Heather Roberts.  Hospitalist request cardiology consult.  Cardiology consulted via [facility].  Case discussed with Dr. Myra Rude.  IV antibiotics held given no acute concern for underlying bacterial infection given patient positive for COVID-19.      ED Course as of 06/28/23 1453   Wed Jun 28, 2023   1402 Creatinine(!): 1.66 [DF]   1402 WBC(!): 3.7 [DF]   1402 Hemoglobin Quant(!): 10.7 [DF]   1402 Platelet Count(!): 105 [DF]   1402 SARS-CoV-2, Rapid(!): Detected [DF]   1403 CXR Findings/impression: Questionable ill-defined opacity of the  retrocardiac region  without other acute cardiopulmonary finding. Dedicated PA/lateral chest would be  useful for further evaluation.   [DF]      ED Course User Index  [DF] Flowers, Joncarlos Atkison Ruffin Pyo., MD     1 or more acute illnesses that pose a threat to life or bodily function.   1 chronic illness with exacerbation.  Drug therapy given requiring intensive monitoring for toxicity.  Discussion with external consultants.  Chronic medical problems impacting care include CAD, HTN.  Shared medical decision making was utilized in creating the patients health plan today.  I independently ordered and reviewed each unique test.    I reviewed external records: provider visit note from PCP.  I reviewed external records: provider visit note from outside specialist.  I reviewed external records: previous EKG including cardiologist interpretation.    I reviewed external records: previous lab results from outside ED.  I reviewed external records: previous imaging study including radiologist interpretation.     Echocardiogram reviewed from 06/19/2020:  Mild global hypokinesis     Left Ventricle: Mildly dilated left ventricular size.     Left Ventricle: Systolic function is mildly abnormal. EF: 45-50%. Quantitative analysis of left ventricle Global Longitudinal Strain (GLS) imaging is >/= -18% which is abnormal; GLS = -14.300% from the apical  4,3,2 chamber views respectively.     Aortic Valve: Mild regurgitation.  Mitral Valve: There is mild to moderate 1-2+ regurgitation.     Tricuspid Valve: There is trace regurgitation.     Tricuspid Valve: The right ventricular systolic pressure is normal (<36 mmHg).       I interpreted the X-rays chest x-ray with cardiomegaly.  No pneumothorax.  Agree with radiologist..  ED provider's independent EKG interpretation heart rate 118.  Atrial fibrillation.  No ST elevation  The patient was admitted and I have discussed patient management with the admitting provider.    Exclusion criteria - the  patient is NOT to be included for SEP-1 Core Measure due to: Viral etiology found or highly suspected (including COVID-19) without concomitant bacterial infection   Critical care procedure note : 39 minutes of critical care time was performed in the emergency department. This was separate from any other procedures listed during the patients emergency department course. The failure to initiate these interventions on an urgent basis would likely have resulted in sudden, clinically significant or life-threatening deterioration in the patients condition.     History     89 year old male with history of hypertension, CAD, hyperlipidemia, lung cancer, chronic bronchitis, asthma, TIA, CHF presents with complaint of worsening cough, congestion, wheezing that is progressed over the past month. Facility also concern for dehydration given decreased oral intake.  Patient denies any hemoptysis.  States his cough is productive and yellow/brown in color.  Denies hemoptysis.  Reports occasional chest discomfort when coughing.  Denies abdominal pain.  Denies any calf pain or swelling.    The history is provided by the patient. No language interpreter was used.       ROS     Review of Systems   Constitutional:  Positive for fatigue.   HENT:  Positive for congestion and rhinorrhea. Negative for sore throat.    Respiratory:  Positive for cough and shortness of breath.    Cardiovascular:  Positive for chest pain. Negative for palpitations and leg swelling.   Gastrointestinal:  Positive for nausea. Negative for abdominal pain, constipation and diarrhea.   Genitourinary:  Negative for dysuria and flank pain.   Musculoskeletal:  Positive for myalgias. Negative for arthralgias, back pain, neck pain and neck stiffness.   Skin:  Negative for rash.   Neurological:  Negative for syncope, weakness and headaches.        Physical Exam     Vitals signs and nursing note reviewed:  Vitals:    06/28/23 1258 06/28/23 1328 06/28/23 1343 06/28/23 1358    BP: 132/82 114/62 122/66 112/87   Pulse: 99 (!) 106 (!) 106 (!) 113   Resp: 23 29 28 20    Temp:       TempSrc:       SpO2: 96% 96% 96% 95%   Weight:       Height:          Physical Exam  Vitals and nursing note reviewed.   Constitutional:       Appearance: Normal appearance. He is ill-appearing.   HENT:      Head: Normocephalic.      Nose: Rhinorrhea present.      Mouth/Throat:      Mouth: Mucous membranes are dry.      Pharynx: Oropharynx is clear. No oropharyngeal exudate or posterior oropharyngeal erythema.   Eyes:      Extraocular Movements: Extraocular movements intact.      Pupils: Pupils are equal, round, and reactive to light.   Cardiovascular:      Rate  and Rhythm: Tachycardia present. Rhythm irregular.      Pulses: Normal pulses.   Pulmonary:      Effort: Pulmonary effort is normal.      Breath sounds: Wheezing present.      Comments: Coarse breath sounds with intermittent productive cough with expiratory wheezes present.  Abdominal:      Palpations: Abdomen is soft.      Tenderness: There is no abdominal tenderness. There is no right CVA tenderness, left CVA tenderness, guarding or rebound.      Comments: Soft, nontender, nondistended.  No rebound or guarding.   Musculoskeletal:         General: No swelling or tenderness. Normal range of motion.      Cervical back: Normal range of motion. No rigidity.      Right lower leg: No edema.      Left lower leg: No edema.      Comments: No pitting edema.  No calf tenderness.  No palpable cords   Skin:     Findings: No erythema or rash.   Neurological:      General: No focal deficit present.      Mental Status: He is alert and oriented to person, place, and time.      Cranial Nerves: No cranial nerve deficit.      Sensory: No sensory deficit.      Motor: No weakness.        Procedures     Procedures    Orders placed during this emergency department visit:     Orders Placed This Encounter   Procedures    COVID-19, Rapid    Influenza A/B, Molecular    XR CHEST  PORTABLE    CBC with Auto Differential    CMP    Magnesium    Troponin    Brain Natriuretic Peptide    POCT Urine Dipstick    Cardiac Monitor - ED Only    EKG 12 Lead    Saline lock IV        Medications given during this emergency department visit:     Medications   dilTIAZem 100 mg in sodium chloride 0.9 % 100 mL infusion ([facility]) (has no administration in time range)   ipratropium 0.5 mg-albuterol 2.5 mg (DUONEB) nebulizer solution 1 Dose (1 Dose Inhalation Given 06/28/23 1417)   sodium chloride 0.9 % bolus 500 mL (0 mLs IntraVENous Stopped 06/28/23 1439)   dilTIAZem injection 10 mg (10 mg IntraVENous Given 06/28/23 1453)       New prescriptions:     New Prescriptions    No medications on file        Past History and Complexity:     Past Medical History:   Diagnosis Date    Arthritis     Asthma     Chronic bronchitis (HCC)     Hypertension     Lung cancer (HCC)         No past surgical history on file.     Social History     Socioeconomic History    Marital status: Single   Tobacco Use    Smoking status: Former     Current packs/day: 0.00     Average packs/day: 1 pack/day for 34.0 years (34.0 ttl pk-yrs)     Types: Cigarettes     Start date: 05/31/1954     Quit date: 1990     Years since quitting: 35.1    Smokeless tobacco: Never   Substance and  Sexual Activity    Alcohol use: Never    Drug use: Never    Sexual activity: Not Currently     Partners: Female     Social Determinants of Health     Financial Resource Strain: Low Risk  (10/21/2022)    Received from [facility]    Overall Financial Resource Strain (CARDIA)     Difficulty of Paying Living Expenses: Not very hard   Food Insecurity: No Food Insecurity (10/21/2022)    Received from [facility]    Hunger Vital Sign     Worried About Running Out of Food in the Last Year: Never true     Ran Out of Food in the Last Year: Never true   Transportation Needs: No Transportation Needs (10/21/2022)    Received from [facility] - Transportation     Lack of  Transportation (Medical): No     Lack of Transportation (Non-Medical): No   Physical Activity: Unknown (10/21/2022)    Received from [facility]    Exercise Vital Sign     Days of Exercise per Week: 0 days   Stress: Stress Concern Present (10/21/2022)    Received from [facility] - Occupational Stress Questionnaire     Feeling of Stress : To some extent   Social Connections: Unknown (02/23/2023)    Received from [facility]    Social Connections     Frequency of Communication with Friends and Family: Not asked     Frequency of Social Gatherings with Friends and Family: Not asked   Intimate Partner Violence: Unknown (02/23/2023)    Received from [facility]    Intimate Partner Violence     Fear of Current or Ex-Partner: Not asked     Emotionally Abused: Not asked     Physically Abused: Not asked     Sexually Abused: Not asked        Previous Medications    ALBUTEROL SULFATE HFA (PROVENTIL;VENTOLIN;PROAIR) 108 (90 BASE) MCG/ACT INHALER    Inhale 2 puffs into the lungs every 4 hours as needed for Shortness of Breath    ASCORBIC ACID (VITAMIN C) 500 MG TABLET    Take 1 tablet by mouth daily    LACTOBACILLUS (CULTURELLE) CAPS CAPSULE    Take 1 capsule by mouth daily    LIDOCAINE (LIDODERM) 5 %    Place 1 patch onto the skin daily    LORATADINE (CLARITIN) 10 MG TABLET    Take 1 tablet by mouth daily    LOSARTAN (COZAAR) 25 MG TABLET    1 tablet daily    MULTIPLE VITAMIN (MULTIVITAMIN) TABLET    Take 1 tablet by mouth daily    PANTOPRAZOLE (PROTONIX) 40 MG TABLET    Take 1 tablet by mouth daily    PREDNISOLONE ACETATE (PRED FORTE) 1 % OPHTHALMIC SUSPENSION    1 drop 2 times daily    ROSUVASTATIN (CRESTOR) 10 MG TABLET    3 tablets daily    SILDENAFIL (VIAGRA) 100 MG TABLET    Take 1 tablet by mouth as needed    TAMSULOSIN (FLOMAX) 0.4 MG CAPSULE    Take 1 capsule by mouth daily    TERAZOSIN (HYTRIN) 5 MG CAPSULE    2 capsules daily    TIOTROPIUM-OLODATEROL (STIOLTO) 2.5-2.5  MCG/ACT AERS    Inhale 2 puffs into the lungs daily    VALSARTAN (DIOVAN) 40 MG TABLET    Take 1  tablet by mouth 2 times daily        Results from this emergency department visit:      Results for orders placed or performed during the hospital encounter of 06/28/23   COVID-19, Rapid    Specimen: Nasopharyngeal   Result Value Ref Range    Source NASAL      SARS-CoV-2, Rapid Detected (A) NOTD     Influenza A/B, Molecular    Specimen: Nasopharyngeal   Result Value Ref Range    Influenza A, NAA Not detected NOTD      Influenza B, NAA Not detected NOTD     XR CHEST PORTABLE    Narrative    Chest X-ray    INDICATION: Cough    COMPARISON: 20 June 2023    TECHNIQUE: AP view of the chest was obtained.      Impression    Findings/impression: Questionable ill-defined opacity of the retrocardiac region  without other acute cardiopulmonary finding. Dedicated PA/lateral chest would be  useful for further evaluation.    Electronically signed by Gifford Shave   CBC with Auto Differential   Result Value Ref Range    WBC 3.7 (L) 4.3 - 11.1 K/uL    RBC 3.67 (L) 4.23 - 5.6 M/uL    Hemoglobin 10.7 (L) 13.6 - 17.2 g/dL    Hematocrit 16.1 (L) 41.1 - 50.3 %    MCV 84.7 82 - 102 FL    MCH 29.2 26.1 - 32.9 PG    MCHC 34.4 31.4 - 35.0 g/dL    RDW 09.6 (H) 04.5 - 14.6 %    Platelets 105 (L) 150 - 450 K/uL    MPV Unable to calculate. Recommend adding IPF. 9.4 - 12.3 FL    nRBC 0.00 0.0 - 0.2 K/uL    Neutrophils % 49.2 43.0 - 78.0 %    Lymphocytes % 23.8 13.0 - 44.0 %    Monocytes % 20.1 (H) 4.0 - 12.0 %    Eosinophils % 0.0 (L) 0.5 - 7.8 %    Basophils % 0.5 0.0 - 2.0 %    Immature Granulocytes % 6.4 (H) 0.0 - 5.0 %    Neutrophils Absolute 1.82 1.70 - 8.20 K/UL    Lymphocytes Absolute 0.88 0.50 - 4.60 K/UL    Monocytes Absolute 0.74 0.10 - 1.30 K/UL    Eosinophils Absolute 0.00 0.00 - 0.80 K/UL    Basophils Absolute 0.02 0.00 - 0.20 K/UL    Immature Granulocytes Absolute 0.24 0.0 - 0.5 K/UL    RBC Comment SLIGHT  ANISOCYTOSIS +  POIKILOCYTOSIS        RBC Comment OCCASIONAL  SCHISTOCYTES        WBC Comment Result Confirmed By Smear      Platelet Comment SLIGHT      Differential Type AUTOMATED     CMP   Result Value Ref Range    Sodium 140 136 - 145 mmol/L    Potassium 4.4 3.5 - 5.1 mmol/L    Chloride 107 98 - 107 mmol/L    CO2 17 (L) 20 - 29 mmol/L    Anion Gap 16 7 - 16 mmol/L    Glucose 98 70 - 99 mg/dL    BUN 40 (H) 8 - 23 MG/DL    Creatinine 4.09 (H) 0.80 - 1.30 MG/DL    Est, Glom Filt Rate 39 (L) >60 ml/min/1.37m2    Calcium 8.9 8.8 - 10.2 MG/DL    Total Bilirubin 0.8 0.0 - 1.2  MG/DL    ALT 25 8 - 55 U/L    AST 37 15 - 37 U/L    Alk Phosphatase 61 40 - 129 U/L    Total Protein 6.3 6.3 - 8.2 g/dL    Albumin 3.4 3.2 - 4.6 g/dL    Globulin 2.9 2.3 - 3.5 g/dL    Albumin/Globulin Ratio 1.1 1.0 - 1.9     Magnesium   Result Value Ref Range    Magnesium 2.2 1.8 - 2.4 mg/dL   Troponin   Result Value Ref Range    Troponin T 64.0 (H) 0 - 22 ng/L   Troponin   Result Value Ref Range    Troponin T 68.0 (H) 0 - 22 ng/L   EKG 12 Lead   Result Value Ref Range    Ventricular Rate 118 BPM    Atrial Rate 118 BPM    QRS Duration 93 ms    Q-T Interval 345 ms    QTc Calculation (Bazett) 484 ms    R Axis -10 degrees    T Axis 80 degrees    Diagnosis       Atrial fibrillation with premature ventricular or aberrantly conducted   complexes  Borderline prolonged QT interval    Confirmed by Tonny Bollman MD, Christiane Ha [phone]) on 06/28/2023 1:39:31 PM           XR CHEST PORTABLE   Final Result   Findings/impression: Questionable ill-defined opacity of the retrocardiac region   without other acute cardiopulmonary finding. Dedicated PA/lateral chest would be   useful for further evaluation.      Electronically signed by Gifford Shave                   Recent Labs     06/28/23  1215   COVID19 Detected*        Voice dictation software was used during the making of this note.  This software is not perfect and grammatical and other typographical errors may be present.  This note  has not been completely proofread for errors.     Flowers, Bonner Puna., MD  06/29/23 [phone]

## 2023-06-28 NOTE — ED Notes (Signed)
TRANSFER - OUT REPORT:    Verbal report given to Gabe RN on Paul Medina  being transferred to [id number] for routine progression of patient care       Report consisted of patient's Situation, Background, Assessment and   Recommendations(SBAR).     Information from the following report(s) Nurse Handoff Report was reviewed with the receiving nurse.    Kinder Fall Assessment:    Presents to emergency department  because of falls (Syncope, seizure, or loss of consciousness): No  Age > 70: Yes  Altered Mental Status, Intoxication with alcohol or substance confusion (Disorientation, impaired judgment, poor safety awaremess, or inability to follow instructions): No  Impaired Mobility: Ambulates or transfers with assistive devices or assistance; Unable to ambulate or transer.: Yes  Nursing Judgement: Yes          Lines:   Peripheral IV 06/28/23 Left;Anterior Forearm (Active)        Opportunity for questions and clarification was provided.      Patient transported with:  Monitor and Registered Nurse          Francina Ames, RN  06/28/23 1555

## 2023-06-28 NOTE — ED Triage Notes (Signed)
Pt coming from independent living facility via ems for cold-like symptoms/dehydration/cough x 1 month.

## 2023-06-28 NOTE — Other (Signed)
Patient converted to NSR. Will get EKG. Stop diltiazem gtt and restart home Coreg 6.25mg  BID.     Rometta Emery, APRN - NP

## 2023-06-28 NOTE — Plan of Care (Signed)
4 Eyes Skin Assessment     NAME:  Paul Medina  DATE OF BIRTH:  [birth year]  MEDICAL RECORD NUMBER:  [id number]    The patient is being assessed for  Admission    I agree that at least one RN has performed a thorough Head to Toe Skin Assessment on the patient. ALL assessment sites listed below have been assessed.      Areas assessed by both nurses:    Head, Face, Ears, Shoulders, Back, Chest, Arms, Elbows, Hands, Sacrum. Buttock, Coccyx, Ischium, and Legs. Feet and Heels        Does the Patient have a Wound? No noted wound(s)       Sacrum is red and unblanchable, but skin is intact, allevyn applied  Braden Prevention initiated by RN: Yes  Wound Care Orders initiated by RN: No    Pressure Injury (Stage 3,4, Unstageable, DTI, NWPT, and Complex wounds) if present, place Wound referral order by RN under ORDER ENTRY: No    New Ostomies, if present place, Ostomy referral order under ORDER ENTRY: No     Nurse 1 eSignature: Electronically signed by Rubye Beach, RN on 06/28/23 at 4:31 PM EST    **SHARE this note so that the co-signing nurse can place an eSignature**    Nurse 2 eSignature: Electronically signed by Shane Crutch, RN on 06/28/23 at 4:33 PM EST       06/28/23 1624   Dual Clinician Skin Assessment   Dual Skin Assessment (4 Eyes) X   Second Clinical Assessor (First and Last Name) [facility]   Skin Integumentary    Skin Integumentary (WDL) X   Skin Color Red;Pale   Skin Condition/Temp Warm;Dry;Swollen/edematous;Flaky;Fragile   Skin Integrity Ecchymosis;Redness   Location sacttered ecchymosis, sacrum is red and not blanchable, no open skin,a llevyn applied   Skin Fold Management No   Nails X   Multiple Skin Integrity Sites No   Nail Condition Thick

## 2023-06-28 NOTE — Consults (Signed)
[facility] Cardiology Initial Cardiac Evaluation                Date of  Admission: 06/28/2023 10:56 AM     Primary Care Physician: Unknown, Provider, ANP  Primary Cardiologist: [facility]  Referring Physician: Dr. Heather Roberts  Supervising Physician: Dr. Tonny Bollman    Reason for consult/admission:      Paul Medina is a 89 y.o. 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 He was recently seen in ER 06/20/23 for fall while going to the bathroom at his independent living facility. CT of the brain negative. Today he was noted to be in a-fib on arrival. Notes reviewed, he follows with [facility] last visit 05/09/23 with no mention of a-fib. He was put on azithromycin 2 weeks ago by PCP for cough. COVID swab is positive today. Troponin 64,68, flat. EKG shows a-fib PVC non specific T wave AvL. Diltiazem 10mg  bolus and gtt started. Received NS bolus. Rate is now 90-110. BP 102-120'a/60-70. Chest x-ray shows ill defined opacitiy retocardiac region. Potassium 4.4, mag 2.2 AST/ALT normal, WBC 3.7, platelets 105, Hgb 10.7 creatinine 1.66 which is up from 1.42 in January. Last echo 06/19/20 EF 45-50% mild to moderate MR, mild diastolic dysfunction. His last visit with cardiology was 05/09/23. He was short of breath at that time. Outpatient nuclear stress test was planned for 07/05/23.     Patient reports he has had been getting progressively more week and short of breath over the last 2 weeks. He saw his provider at the [place] 2 weeks ago for cough and was given a z-pack. He still has productive cough and gets weak and short of breath with activity. Reports that up until 2 weeks ago he was participating in exercise classes at his facility but has since stopped because he is too worn out to go. He reports he also stopped taking his medications over the last 2 weeks because he hasn't felt well. He  wears compression stockings to help with ankle swelling and states that the swelling has not gotten worse in the last couple of weeks. Denies CP, palpitations, dizziness, nausea or lightheadedness.       Past Medical History:   Diagnosis Date    Arthritis     Asthma     Chronic bronchitis (HCC)     Hypertension     Lung cancer (HCC)       No past surgical history on file.  Allergies   Allergen Reactions    Ciprofloxacin Other (See Comments)     Other Reaction(s): Joint swelling-Intolerance    Doxycycline Nausea And Vomiting and Nausea Only     Other Reaction(s): GI Intolerance    Lisinopril Cough and Other (See Comments)     Other Reaction(s): Cough, Cough-Intolerance, Other (See Comments)    cough    cough   cough   cough    Other Other (See Comments) and Nausea And Vomiting     Causes sneezing, itchy/runny nose    Other Reaction(s): GI Intolerance    unknown    unknown, unknown    unknown   unknown    Causes sneezing, itchy/runny nose   Causes sneezing, itchy/runny nose   Causes sneezing, itchy/runny nose    Sulfa Antibiotics Nausea And Vomiting, Other (See Comments) and Rash     Other Reaction(s): GI Intolerance, Other- (not listed) - Allergy,  Unknown    Unknown    Sulfasalazine Rash    Sulfur Itching and Nausea Only      No family history on file.     Current Facility-Administered Medications   Medication Dose Route Frequency    dilTIAZem 100 mg in sodium chloride 0.9 % 100 mL infusion ([facility])  2.5-15 mg/hr IntraVENous Continuous    albuterol (PROVENTIL) (2.5 MG/3ML) 0.083% nebulizer solution 2.5 mg  2.5 mg Nebulization Q6H PRN    cetirizine (ZYRTEC) tablet 10 mg  10 mg Oral Daily    pantoprazole (PROTONIX) tablet 40 mg  40 mg Oral Daily    rosuvastatin (CRESTOR) tablet 30 mg  30 mg Oral Nightly    tamsulosin (FLOMAX) capsule 0.4 mg  0.4 mg Oral Daily    sodium chloride flush 0.9 % injection 5-40 mL  5-40 mL IntraVENous 2 times per day    sodium chloride flush 0.9 % injection 5-40 mL  5-40 mL IntraVENous  PRN    0.9 % sodium chloride infusion   IntraVENous PRN    ondansetron (ZOFRAN-ODT) disintegrating tablet 4 mg  4 mg Oral Q8H PRN    Or    ondansetron (ZOFRAN) injection 4 mg  4 mg IntraVENous Q6H PRN    polyethylene glycol (GLYCOLAX) packet 17 g  17 g Oral Daily PRN    acetaminophen (TYLENOL) tablet 650 mg  650 mg Oral Q6H PRN    Or    acetaminophen (TYLENOL) suppository 650 mg  650 mg Rectal Q6H PRN    carvedilol (COREG) tablet 3.125 mg  3.125 mg Oral BID WC    [START ON 06/29/2023] prednisoLONE acetate (PRED FORTE) 1 % ophthalmic suspension 1 drop  1 drop Left Eye Daily    [START ON 06/29/2023] atropine 1 % ophthalmic solution 1 drop  1 drop Left Eye Daily    brimonidine (ALPHAGAN) 0.2 % ophthalmic solution 1 drop  1 drop Left Eye TID    arformoterol tartrate (BROVANA) nebulizer solution 15 mcg  15 mcg Nebulization BID RT    ipratropium (ATROVENT) 0.02 % nebulizer solution 0.5 mg  0.5 mg Nebulization 4x Daily RT    cefTRIAXone (ROCEPHIN) 1,000 mg in sterile water 10 mL IV syringe  1,000 mg IntraVENous Q24H    doxycycline hyclate (VIBRAMYCIN) capsule 100 mg  100 mg Oral 2 times per day    methylPREDNISolone sodium succ (SOLU-MEDROL) 40 mg in sterile water 1 mL injection  40 mg IntraVENous Q8H     Current Outpatient Medications   Medication Sig    ascorbic acid (VITAMIN C) 500 MG tablet Take 1 tablet by mouth daily    lactobacillus (CULTURELLE) CAPS capsule Take 1 capsule by mouth daily    lidocaine (LIDODERM) 5 % Place 1 patch onto the skin daily    loratadine (CLARITIN) 10 MG tablet Take 1 tablet by mouth daily    losartan (COZAAR) 25 MG tablet 1 tablet daily    Multiple Vitamin (MULTIVITAMIN) tablet Take 1 tablet by mouth daily    pantoprazole (PROTONIX) 40 MG tablet Take 1 tablet by mouth daily    prednisoLONE acetate (PRED FORTE) 1 % ophthalmic suspension 1 drop 2 times daily    rosuvastatin (CRESTOR) 10 MG tablet 3 tablets daily    tamsulosin (FLOMAX) 0.4 MG capsule Take 1 capsule by mouth daily    sildenafil  (VIAGRA) 100 MG tablet Take 1 tablet by mouth as needed    terazosin (HYTRIN) 5 MG capsule 2 capsules daily    valsartan (DIOVAN)  40 MG tablet Take 1 tablet by mouth 2 times daily    tiotropium-olodaterol (STIOLTO) 2.5-2.5 MCG/ACT AERS Inhale 2 puffs into the lungs daily    albuterol sulfate HFA (PROVENTIL;VENTOLIN;PROAIR) 108 (90 Base) MCG/ACT inhaler Inhale 2 puffs into the lungs every 4 hours as needed for Shortness of Breath       Review of Systems    Review of Systems   Constitutional: Negative for chills, decreased appetite and fever.   Cardiovascular:  Positive for dyspnea on exertion and irregular heartbeat. Negative for chest pain and near-syncope.   Respiratory:  Positive for cough and shortness of breath.    Gastrointestinal:  Negative for bloating and abdominal pain.           Subjective:   BP 117/76   Pulse (!) 111   Temp 98.3 F (36.8 C) (Oral)   Resp 28   Ht 1.676 m (5\' 6" )   Wt 81.6 kg (180 lb)   SpO2 90%   BMI 29.05 kg/m   Physical Exam  HENT:      Head: Normocephalic.   Eyes:      Pupils: Pupils are equal, round, and reactive to light.   Cardiovascular:      Rate and Rhythm: Rhythm irregular.      Heart sounds: No murmur heard.     Comments: Trace ankle edema  Pulmonary:      Breath sounds: Wheezing present.   Abdominal:      Palpations: Abdomen is soft.   Skin:     General: Skin is warm and dry.   Neurological:      Mental Status: He is alert and oriented to person, place, and time.   Psychiatric:         Mood and Affect: Mood normal.          Cardiographics    Telemetry: A-fib   ECG: A-fib PVC non specific T wave AvL .   ECHO: No results found for this or any previous visit.    Cath: No results found for this or any previous visit.      Labs:   Recent Labs     06/28/23  1113   NA 140   K 4.4   MG 2.2   BUN 40*   WBC 3.7*   HGB 10.7*   HCT 31.1*   PLT 105*     CHA2DS2-VASc Score for Atrial Fibrillation Stroke Risk   Risk   Factors  Component Value   C CHF No 0   H HTN Yes 1   A2 Age >= 75  Yes,  (89 y.o.) 2   D DM No 0   S2 Prior Stroke/TIA Yes 2   V Vascular Disease No 0   A Age 70-74 No,  (89 y.o.) 0   Sc Sex male 0    CHA2DS2-VASc  Score  5   Score last updated 06/28/23 3:51 PM EST    Click here for a link to the UpToDate guideline "Atrial Fibrillation: Anticoagulation therapy to prevent embolization    Disclaimer: Risk Score calculation is dependent on accuracy of patient problem list and past encounter diagnosis.       Assessment/Plan:     Assessment:      Atrial fibrillation with rapid ventricular response (HCC): Stop Coreg. Will start Metoprolol 25mg  Q 6, wean off diltiazem gtt. Start Eliquis 2.5mg  PO BID for CVA prevention, creatinine 1.66. NPO after midnight for possible cardioversion tomorrow morning. Echo ordered.  COVID: Supportive care. Currently O2 96% on RA.       HTN (hypertension): Continue monitor BP closely. Stop Coreg, starting Metoprolol. See above.       HLD (hyperlipidemia): Continue crestor 30mg  HS.        CAD post PCI to LAD:  Continue ASA, BB, crestor.       Stage 3 chronic kidney disease [facility]): Monitor renal function and electrolytes.       OSA: C-pap.           Rometta Emery, APRN - NP  Consulting MD: Dr. Tonny Bollman

## 2023-06-29 ENCOUNTER — Inpatient Hospital Stay: Admit: 2023-06-29 | Discharge: 2023-07-14 | Payer: PRIVATE HEALTH INSURANCE | Primary: Diagnostic Radiology

## 2023-06-29 ENCOUNTER — Inpatient Hospital Stay: Admit: 2023-06-29 | Payer: PRIVATE HEALTH INSURANCE | Primary: Diagnostic Radiology

## 2023-06-29 DIAGNOSIS — U071 COVID-19: Secondary | ICD-10-CM

## 2023-06-29 LAB — EKG 12-LEAD
Atrial Rate: 73 {beats}/min
Diagnosis: NORMAL
P Axis: 38 degrees
P-R Interval: 180 ms
Q-T Interval: 426 ms
QRS Duration: 98 ms
QTc Calculation (Bazett): 469 ms
R Axis: -47 degrees
T Axis: 83 degrees
Ventricular Rate: 73 {beats}/min

## 2023-06-29 LAB — ECHO (TTE) COMPLETE (PRN CONTRAST/BUBBLE/STRAIN/3D)
AR Max Velocity PISA: 3.9 m/s
AR PHT: 641 ms
AV Area by Peak Velocity: 1.3 cm2
AV Area by VTI: 1.4 cm2
AV Mean Gradient: 8 mm[Hg]
AV Mean Velocity: 1.3 m/s
AV Peak Gradient: 13 mm[Hg]
AV Peak Velocity: 1.8 m/s
AV VTI: 37.4 cm
AV Velocity Ratio: 0.44
AVA/BSA Peak Velocity: 0.7 cm2/m2
AVA/BSA VTI: 0.7 cm2/m2
Ao Root Index: 1.93 cm/m2
Aortic Root: 3.7 cm
Ascending Aorta Index: 1.77 cm/m2
Ascending Aorta: 3.4 cm
Body Surface Area: 1.95 m2
E/E' Lateral: 20.28
E/E' Ratio (Averaged): 20
E/E' Septal: 19.73
EF BP: 30 % — AB (ref 55–100)
Fractional Shortening 2D: 17 % (ref 28–44)
IVSd: 0.9 cm (ref 0.6–1.0)
LA Area 2C: 18.1 cm2
LA Area 4C: 25.3 cm2
LA Diameter: 5 cm
LA Major Axis: 6.4 cm
LA Minor Axis: 5.4 cm
LA Size Index: 2.6 cm/m2
LA Volume BP: 67 mL — AB (ref 18–58)
LA Volume Index BP: 35 mL/m2 — AB (ref 16–34)
LA Volume Index MOD A2C: 24 mL/m2 (ref 16–34)
LA Volume Index MOD A4C: 43 mL/m2 — AB (ref 16–34)
LA Volume MOD A2C: 47 mL (ref 18–58)
LA Volume MOD A4C: 82 mL — AB (ref 18–58)
LA/AO Root Ratio: 1.35
LV E' Lateral Velocity: 3.6 cm/s
LV E' Septal Velocity: 3.7 cm/s
LV EDV A2C: 209 mL
LV EDV A4C: 182 mL
LV EDV Index A2C: 109 mL/m2
LV EDV Index A4C: 95 mL/m2
LV ESV A2C: 143 mL
LV ESV A4C: 129 mL
LV ESV Index A2C: 74 mL/m2
LV ESV Index A4C: 67 mL/m2
LV Ejection Fraction A2C: 32 %
LV Ejection Fraction A4C: 29 %
LV Mass 2D Index: 129.1 g/m2 — AB (ref 49–115)
LV Mass 2D: 247.8 g — AB (ref 88–224)
LV RWT Ratio: 0.28
LVIDd Index: 3.39 cm/m2
LVIDd: 6.5 cm — AB (ref 4.2–5.9)
LVIDs Index: 2.81 cm/m2
LVIDs: 5.4 cm
LVOT Area: 3.1 cm2
LVOT Diameter: 2 cm
LVOT Mean Gradient: 1 mm[Hg]
LVOT Peak Gradient: 2 mm[Hg]
LVOT Peak Velocity: 0.8 m/s
LVOT SV: 51.8 mL
LVOT Stroke Volume Index: 27 mL/m2
LVOT VTI: 16.5 cm
LVOT:AV VTI Index: 0.44
LVPWd: 0.9 cm (ref 0.6–1.0)
MR Peak Gradient: 117 mm[Hg]
MR Peak Velocity: 5.4 m/s
MR VTI: 226 cm
MV A Velocity: 1.27 m/s
MV Area by VTI: 1.3 cm2
MV E Velocity: 0.73 m/s
MV E Wave Deceleration Time: 172 ms
MV E/A: 0.57
MV Max Velocity: 1.4 m/s
MV Mean Gradient: 3 mm[Hg]
MV Mean Velocity: 0.9 m/s
MV Peak Gradient: 8 mm[Hg]
MV VTI: 39.1 cm
MV:LVOT VTI Index: 2.37
PV AT: 74 ms
PV Max Velocity: 1 m/s
PV Peak Gradient: 4 mm[Hg]
RV Basal Dimension: 2.8 cm
RV Free Wall Peak S': 13.5 cm/s
TAPSE: 2.2 cm (ref 1.7–?)
TR Max Velocity: 2.66 m/s
TR Peak Gradient: 28 mm[Hg]

## 2023-06-29 LAB — LACTATE DEHYDROGENASE: LD: 300 U/L — ABNORMAL HIGH (ref 127–281)

## 2023-06-29 LAB — BASIC METABOLIC PANEL W/ REFLEX TO MG FOR LOW K
Anion Gap: 13 mmol/L (ref 7–16)
BUN: 39 mg/dL — ABNORMAL HIGH (ref 8–23)
CO2: 17 mmol/L — ABNORMAL LOW (ref 20–29)
Calcium: 8.4 mg/dL — ABNORMAL LOW (ref 8.8–10.2)
Chloride: 107 mmol/L (ref 98–107)
Creatinine: 1.4 mg/dL — ABNORMAL HIGH (ref 0.80–1.30)
Est, Glom Filt Rate: 47 mL/min/{1.73_m2} — ABNORMAL LOW (ref >60)
Glucose: 177 mg/dL — ABNORMAL HIGH (ref 70–99)
Potassium: 4.9 mmol/L (ref 3.5–5.1)
Sodium: 137 mmol/L (ref 136–145)

## 2023-06-29 LAB — CBC WITH AUTO DIFFERENTIAL
Basophils %: 0.6 % (ref 0.0–2.0)
Basophils Absolute: 0.01 10*3/uL (ref 0.00–0.20)
Eosinophils %: 0.6 % (ref 0.5–7.8)
Eosinophils Absolute: 0.01 10*3/uL (ref 0.00–0.80)
Hematocrit: 29.9 % — ABNORMAL LOW (ref 41.1–50.3)
Hemoglobin: 10.2 g/dL — ABNORMAL LOW (ref 13.6–17.2)
Immature Granulocytes %: 8.5 % — ABNORMAL HIGH (ref 0.0–5.0)
Immature Granulocytes Absolute: 0.14 10*3/uL (ref 0.0–0.5)
Lymphocytes %: 27.3 % (ref 13.0–44.0)
Lymphocytes Absolute: 0.46 10*3/uL — ABNORMAL LOW (ref 0.50–4.60)
MCH: 29 pg (ref 26.1–32.9)
MCHC: 34.1 g/dL (ref 31.4–35.0)
MCV: 84.9 fL (ref 82–102)
MPV: UNDETERMINED fL (ref 9.4–12.3)
Monocytes %: 6.1 % (ref 4.0–12.0)
Monocytes Absolute: 0.1 10*3/uL (ref 0.10–1.30)
Neutrophils %: 56.9 % (ref 43.0–78.0)
Neutrophils Absolute: 0.97 10*3/uL — ABNORMAL LOW (ref 1.70–8.20)
Platelets: 91 10*3/uL — ABNORMAL LOW (ref 150–450)
RBC: 3.52 M/uL — ABNORMAL LOW (ref 4.23–5.6)
RDW: 14.7 % — ABNORMAL HIGH (ref 11.9–14.6)
WBC: 1.7 10*3/uL — CL (ref 4.3–11.1)
nRBC: 0 10*3/uL (ref 0.0–0.2)

## 2023-06-29 LAB — IRON AND TIBC
Iron % Saturation: 30 % (ref 20–50)
Iron: 46 ug/dL (ref 35–100)
TIBC: 155 ug/dL — ABNORMAL LOW (ref 240–450)
UIBC: 109 ug/dL — ABNORMAL LOW (ref 112.0–347.0)

## 2023-06-29 LAB — RETICULOCYTES
Absolute Retic #: 0.0189 M/ul — ABNORMAL LOW (ref 0.026–0.095)
Immature Retic Fraction: 7.5 % (ref 2.3–13.4)
Retic Hemoglobin conc.: 35 pg (ref 29–35)
Reticulocyte Count,Automated: 0.5 % (ref 0.3–2.0)

## 2023-06-29 LAB — URINALYSIS WITH REFLEX TO CULTURE
BACTERIA, URINE: NEGATIVE /[HPF]
Bilirubin, Urine: NEGATIVE
Blood, Urine: NEGATIVE
Casts: 0 /[LPF]
Crystals: 0 /[LPF]
Glucose, Ur: NEGATIVE mg/dL
Leukocyte Esterase, Urine: NEGATIVE
Mucus, UA: 0 /[LPF]
Nitrite, Urine: NEGATIVE
Protein, UA: NEGATIVE mg/dL
Specific Gravity, UA: 1.022 (ref 1.001–1.023)
Urobilinogen, Urine: 0.2 U/dL (ref 0.2–1.0)
pH, Urine: 5 (ref 5.0–9.0)

## 2023-06-29 LAB — D-DIMER, QUANTITATIVE: D-Dimer, Quant: 1.6 ug{FEU}/mL — ABNORMAL HIGH (ref <0.56)

## 2023-06-29 LAB — BILIRUBIN TOTAL DIRECT & INDIRECT
Bilirubin, Direct: <0.2 mg/dL (ref 0.0–0.4)
Total Bilirubin: 0.4 mg/dL (ref 0.0–1.2)

## 2023-06-29 LAB — APTT: APTT: 33.9 s (ref 23.3–37.4)

## 2023-06-29 LAB — VITAMIN D 25 HYDROXY: Vit D, 25-Hydroxy: 67.1 ng/mL (ref 30.0–100.0)

## 2023-06-29 LAB — FIBRINOGEN: Fibrinogen: 455 mg/dL (ref 190–501)

## 2023-06-29 LAB — FOLATE: Folate: 36.6 ng/mL — ABNORMAL HIGH (ref 3.1–17.5)

## 2023-06-29 LAB — FERRITIN: Ferritin: 2952 ng/mL — ABNORMAL HIGH (ref 8–388)

## 2023-06-29 LAB — PROTIME-INR
INR: 1.2
Protime: 15.1 s — ABNORMAL HIGH (ref 11.3–14.9)

## 2023-06-29 LAB — VITAMIN B12: Vitamin B-12: 2380 pg/mL — ABNORMAL HIGH (ref 193–986)

## 2023-06-29 MED ORDER — METHYLPREDNISOLONE SODIUM SUCC 40 MG IJ SOLR
40 | Freq: Two times a day (BID) | INTRAMUSCULAR | Status: DC
Start: 2023-06-29 — End: 2023-06-30
  Administered 2023-06-30 (×2): 40 mg via INTRAVENOUS

## 2023-06-29 MED ORDER — SULFUR HEXAFLUORIDE MICROSPH 60.7-25 MG IJ SUSR
60.7-25 | Freq: Once | INTRAMUSCULAR | Status: AC | PRN
Start: 2023-06-29 — End: 2023-06-29
  Administered 2023-06-29: 20:00:00 2 mL via INTRAVENOUS

## 2023-06-29 MED ORDER — BENZOCAINE-MENTHOL 15-2.6 MG MT LOZG
15-2.6 MG | OROMUCOSAL | Status: AC | PRN
Start: 2023-06-29 — End: 2023-07-03
  Administered 2023-06-29 – 2023-06-30 (×3): 1 via ORAL

## 2023-06-29 MED ORDER — IOPAMIDOL 76 % IV SOLN
76 | Freq: Once | INTRAVENOUS | Status: AC | PRN
Start: 2023-06-29 — End: 2023-06-29
  Administered 2023-06-29: 20:00:00 75 mL via INTRAVENOUS

## 2023-06-29 MED FILL — BENZOCAINE-MENTHOL 15-2.6 MG MT LOZG: 15-2.6 MG | OROMUCOSAL | Qty: 1

## 2023-06-29 MED FILL — ONDANSETRON 4 MG PO TBDP: 4 MG | ORAL | Qty: 1

## 2023-06-29 MED FILL — DOXYCYCLINE HYCLATE 100 MG PO CAPS: 100 MG | ORAL | Qty: 1

## 2023-06-29 MED FILL — FUROSEMIDE 20 MG PO TABS: 20 MG | ORAL | Qty: 1

## 2023-06-29 MED FILL — ATROPINE SULFATE 1 % OP SOLN: 1 % | OPHTHALMIC | Qty: 2

## 2023-06-29 MED FILL — MUCUS RELIEF ER 600 MG PO TB12: 600 MG | ORAL | Qty: 2

## 2023-06-29 MED FILL — CEFTRIAXONE SODIUM 1 G IJ SOLR: 1 g | INTRAMUSCULAR | Qty: 1000

## 2023-06-29 MED FILL — CULTURELLE PO CAPS: ORAL | Qty: 1

## 2023-06-29 MED FILL — ARFORMOTEROL TARTRATE 15 MCG/2ML IN NEBU: 152 MCG/2ML | RESPIRATORY_TRACT | Qty: 2

## 2023-06-29 MED FILL — CARVEDILOL 6.25 MG PO TABS: 6.25 MG | ORAL | Qty: 1

## 2023-06-29 MED FILL — IPRATROPIUM BROMIDE 0.02 % IN SOLN: 0.02 % | RESPIRATORY_TRACT | Qty: 2.5

## 2023-06-29 MED FILL — LUMASON 60.7-25 MG IJ SUSR: 60.7-25 MG | INTRAMUSCULAR | Qty: 2

## 2023-06-29 MED FILL — TAMSULOSIN HCL 0.4 MG PO CAPS: 0.4 MG | ORAL | Qty: 1

## 2023-06-29 MED FILL — METHYLPREDNISOLONE SODIUM SUCC 40 MG IJ SOLR: 40 MG | INTRAMUSCULAR | Qty: 40

## 2023-06-29 MED FILL — PREDNISOLONE ACETATE 1 % OP SUSP: 1 % | OPHTHALMIC | Qty: 100

## 2023-06-29 MED FILL — PANTOPRAZOLE SODIUM 40 MG PO TBEC: 40 MG | ORAL | Qty: 1

## 2023-06-29 MED FILL — ELIQUIS 2.5 MG PO TABS: 2.5 MG | ORAL | Qty: 1

## 2023-06-29 MED FILL — CETIRIZINE HCL 10 MG PO TABS: 10 MG | ORAL | Qty: 1

## 2023-06-29 MED FILL — ROSUVASTATIN CALCIUM 10 MG PO TABS: 10 MG | ORAL | Qty: 1

## 2023-06-29 NOTE — Progress Notes (Signed)
[facility] PROGRESS NOTE           06/29/2023 9:32 AM    Admit Date: 06/28/2023      Subjective:     In sinus rhythm.  On room air.  States improved symptoms compared to presentation.    ROS:  Cardiovascular:  As noted above    Objective:      Vitals:    06/29/23 0428 06/29/23 0548 06/29/23 0801 06/29/23 0844   BP: 137/75  128/71    Pulse: 54  55 85   Resp: 16  16 16    Temp: 97.3 F (36.3 C)  97.5 F (36.4 C)    TempSrc: Axillary  Axillary    SpO2: 97%  96% 95%   Weight:  82.2 kg (181 lb 4.8 oz)     Height:           Physical Exam:  General-No Acute Distress  Neck- supple, no JVD  CV- regular rate and rhythm no MRG  Lung- clear bilaterally  Abd- soft, nontender, nondistended  Ext- no edema bilaterally.  Skin- warm and dry    Data Review:   Recent Labs     06/28/23  1113 06/29/23  0413   NA 140 137   K 4.4 4.9   MG 2.2  --    BUN 40* 39*   WBC 3.7* 1.7*   HGB 10.7* 10.2*   HCT 31.1* 29.9*   PLT 105* 91*       Assessment/Plan:     Principal Problem:    Atrial fibrillation with rapid ventricular response (HCC)  -Management sinus rhythm; likely triggered by acute underlying issues.  -Has been initiated on Eliquis; defer long-term anticoagulation to primary cardiologist in the outpatient setting.  No prior history of AF.  -Closely monitor Eliquis dosing based on renal function.    Active Problems:    Chronic heart failure with mildly reduced ejection fraction (HFmrEF, 41-49%) (HCC)  -Stated mild left ventricular dysfunction from [facility] from echo from 05/2020; prior remote PCI to the LAD.  -Continue Coreg/losartan; exam currently euvolemic.  Pending echocardiogram and address further therapy for left ventricular dysfunction during hospital course.      HTN (hypertension)  -Target less than 130/80.      HLD (hyperlipidemia)  -Continue current Crestor      COVID    Moderate asthma with exacerbation  -Per primary team         Paul Gilford, MD  06/29/2023 9:32 AM

## 2023-06-29 NOTE — Plan of Care (Signed)
Problem: Discharge Planning  Goal: Discharge to home or other facility with appropriate resources  Outcome: Progressing     Problem: Skin/Tissue Integrity  Goal: Skin integrity remains intact  Description: 1.  Monitor for areas of redness and/or skin breakdown  2.  Assess vascular access sites hourly  3.  Every 4-6 hours minimum:  Change oxygen saturation probe site  4.  Every 4-6 hours:  If on nasal continuous positive airway pressure, respiratory therapy assess nares and determine need for appliance change or resting period  Outcome: Progressing     Problem: Safety - Adult  Goal: Free from fall injury  Outcome: Progressing

## 2023-06-29 NOTE — Consults (Signed)
[facility]        Inpatient Hematology / Oncology Consult    Reason for Consult:  Dehydration [E86.0]  New onset atrial fibrillation (HCC) [I48.91]  AKI (acute kidney injury) (HCC) [N17.9]  Atrial fibrillation with rapid ventricular response (HCC) [I48.91]  COVID-19 virus infection [U07.1]  Referring Physician:  Verdis Prime, MD    History of Present Illness:  Paul Medina is a 89 y.o. male admitted on 06/28/2023. The primary encounter diagnosis was COVID-19 virus infection. Diagnoses of Dehydration, New onset atrial fibrillation (HCC), AKI (acute kidney injury) (HCC), and Atrial fibrillation, unspecified type (HCC) were also pertinent to this visit.Marland Kitchen      His PMH includes asthma, HTN, OSA, TIA, GERD, CKD3, and L eye blindness.  He is a patient of Dr. 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  He presented to ED with c/o fatigue, weakness, and dry cough.  He completed azithromycin x 2 weeks ago with no improvement in symptoms.  He also reports fall 2/2 weakness ~2 weeks ago.  On admission, WBC 3.7 / ANC 1.8, Hgb 10.7 (b/l ~8-10s), Plt 91k (b/l ~70-110s).  Cr 1.6 (b/l ~1.0 - 1.4.  pBNP 2539.  EKG with Afib with RVR, required cardizem gtt.  CXR with ?ill-defined opacity of retrocardiac region.  On Solumedrol 40mg  q 8 hours.  Flu neg.  COVID +ve.  On CTX/Doxy.  We were consulted for leukopenia and thrombocytopenia in the setting of DLBCL in remission and active COVID infection.      Allergies   Allergen Reactions    Ciprofloxacin Other (See Comments)     Other Reaction(s): Joint swelling-Intolerance    Doxycycline Nausea And Vomiting and Nausea Only     Other Reaction(s): GI Intolerance    Lisinopril Cough and Other (See Comments)     Other Reaction(s): Cough, Cough-Intolerance, Other (See Comments)    cough    cough   cough   cough    Other Other (See Comments) and Nausea And Vomiting     Causes sneezing, itchy/runny nose    Other Reaction(s): GI  Intolerance    unknown    unknown, unknown    unknown   unknown    Causes sneezing, itchy/runny nose   Causes sneezing, itchy/runny nose   Causes sneezing, itchy/runny nose    Sulfa Antibiotics Nausea And Vomiting, Other (See Comments) and Rash     Other Reaction(s): GI Intolerance, Other- (not listed) - Allergy, Unknown    Unknown    Sulfasalazine Rash    Sulfur Itching and Nausea Only     Past Medical History:   Diagnosis Date    Arthritis     Asthma     Chronic bronchitis (HCC)     Hypertension     Lung cancer (HCC)      No past surgical history on file.  No family history on file.  Social History     Socioeconomic History    Marital status: Single     Spouse name: Not on file    Number of children: Not on file    Years of education: Not on file    Highest education level: Not on file   Occupational History    Not on file   Tobacco Use    Smoking status: Former     Current packs/day: 0.00     Average packs/day: 1 pack/day for 34.0 years (34.0 ttl pk-yrs)     Types: Cigarettes  Start date: 05/31/1954     Quit date: 1990     Years since quitting: 35.1    Smokeless tobacco: Never   Substance and Sexual Activity    Alcohol use: Never    Drug use: Never    Sexual activity: Not Currently     Partners: Female   Other Topics Concern    Not on file   Social History Narrative    Not on file     Social Determinants of Health     Financial Resource Strain: Low Risk  (10/21/2022)    Received from [facility]    Overall Financial Resource Strain (CARDIA)     Difficulty of Paying Living Expenses: Not very hard   Food Insecurity: No Food Insecurity (10/21/2022)    Received from [facility]    Hunger Vital Sign     Worried About Running Out of Food in the Last Year: Never true     Ran Out of Food in the Last Year: Never true   Transportation Needs: No Transportation Needs (10/21/2022)    Received from [facility] - Transportation     Lack of Transportation (Medical): No     Lack of Transportation (Non-Medical): No    Physical Activity: Unknown (10/21/2022)    Received from [facility]    Exercise Vital Sign     Days of Exercise per Week: 0 days     Minutes of Exercise per Session: Not on file   Stress: Stress Concern Present (10/21/2022)    Received from [facility] - Occupational Stress Questionnaire     Feeling of Stress : To some extent   Social Connections: Unknown (02/23/2023)    Received from [facility]    Social Connections     Frequency of Communication with Friends and Family: Not asked     Frequency of Social Gatherings with Friends and Family: Not asked   Intimate Partner Violence: Unknown (02/23/2023)    Received from [facility]    Intimate Partner Violence     Fear of Current or Ex-Partner: Not asked     Emotionally Abused: Not asked     Physically Abused: Not asked     Sexually Abused: Not asked   Housing Stability: Not on file     Current Facility-Administered Medications   Medication Dose Route Frequency Provider Last Rate Last Admin    dilTIAZem 100 mg in sodium chloride 0.9 % 100 mL infusion ([facility])  2.5-15 mg/hr IntraVENous Continuous Flowers, Bonner Puna., MD   Stopped at 06/28/23 1736    albuterol (PROVENTIL) (2.5 MG/3ML) 0.083% nebulizer solution 2.5 mg  2.5 mg Nebulization Q6H PRN Britta Mccreedy, MD        cetirizine (ZYRTEC) tablet 10 mg  10 mg Oral Daily Britta Mccreedy, MD        pantoprazole (PROTONIX) tablet 40 mg  40 mg Oral Daily Britta Mccreedy, MD   40 mg at 06/29/23 0502    rosuvastatin (CRESTOR) tablet 30 mg  30 mg Oral Nightly Britta Mccreedy, MD   30 mg at 06/28/23 2011    tamsulosin (FLOMAX) capsule 0.4 mg  0.4 mg Oral Daily Britta Mccreedy, MD   0.4 mg at 06/29/23 0913    sodium chloride flush 0.9 % injection 5-40 mL  5-40 mL IntraVENous 2 times per day Britta Mccreedy, MD   10 mL at 06/29/23 0915    sodium chloride flush 0.9 %  injection 5-40 mL  5-40 mL IntraVENous PRN Britta Mccreedy, MD        0.9 % sodium chloride  infusion   IntraVENous PRN Britta Mccreedy, MD        ondansetron (ZOFRAN-ODT) disintegrating tablet 4 mg  4 mg Oral Q8H PRN Britta Mccreedy, MD   4 mg at 06/28/23 2058    Or    ondansetron (ZOFRAN) injection 4 mg  4 mg IntraVENous Q6H PRN Britta Mccreedy, MD        polyethylene glycol (GLYCOLAX) packet 17 g  17 g Oral Daily PRN Britta Mccreedy, MD        acetaminophen (TYLENOL) tablet 650 mg  650 mg Oral Q6H PRN Britta Mccreedy, MD        Or    acetaminophen (TYLENOL) suppository 650 mg  650 mg Rectal Q6H PRN Britta Mccreedy, MD        prednisoLONE acetate (PRED FORTE) 1 % ophthalmic suspension 1 drop  1 drop Left Eye Daily Britta Mccreedy, MD   1 drop at 06/29/23 1028    atropine 1 % ophthalmic solution 1 drop  1 drop Left Eye Daily Britta Mccreedy, MD   1 drop at 06/29/23 1028    brimonidine (ALPHAGAN) 0.2 % ophthalmic solution 1 drop  1 drop Left Eye TID Britta Mccreedy, MD   1 drop at 06/29/23 0915    arformoterol tartrate (BROVANA) nebulizer solution 15 mcg  15 mcg Nebulization BID RT Britta Mccreedy, MD   15 mcg at 06/29/23 0844    ipratropium (ATROVENT) 0.02 % nebulizer solution 0.5 mg  0.5 mg Nebulization 4x Daily RT Britta Mccreedy, MD   0.5 mg at 06/29/23 0844    cefTRIAXone (ROCEPHIN) 1,000 mg in sterile water 10 mL IV syringe  1,000 mg IntraVENous Q24H Britta Mccreedy, MD   1,000 mg at 06/28/23 1704    doxycycline hyclate (VIBRAMYCIN) capsule 100 mg  100 mg Oral 2 times per day Britta Mccreedy, MD   100 mg at 06/29/23 0914    methylPREDNISolone sodium succ (SOLU-MEDROL) 40 mg in sterile water 1 mL injection  40 mg IntraVENous Q8H Britta Mccreedy, MD   40 mg at 06/29/23 0914    guaiFENesin (MUCINEX) extended release tablet 1,200 mg  1,200 mg Oral BID Britta Mccreedy, MD   1,200 mg at 06/29/23 0914    apixaban (ELIQUIS) tablet 2.5 mg  2.5 mg Oral BID Rometta Emery, APRN - NP   2.5 mg at 06/29/23 0913    carvedilol (COREG) tablet 6.25 mg  6.25 mg Oral BID WC Rometta Emery, APRN - NP   6.25  mg at 06/29/23 1610    furosemide (LASIX) tablet 20 mg  20 mg Oral Daily Rometta Emery, APRN - NP   20 mg at 06/29/23 9604    lactobacillus (CULTURELLE) capsule 1 capsule  1 capsule Oral Daily with breakfast Britta Mccreedy, MD   1 capsule at 06/29/23 0914       OBJECTIVE:  Patient Vitals for the past 8 hrs:   BP Temp Temp src Pulse Resp SpO2 Weight   06/29/23 0844 -- -- -- 85 16 95 % --   06/29/23 0801 128/71 97.5 F (36.4 C) Axillary 55 16 96 % --   06/29/23 0548 -- -- -- -- -- -- 82.2 kg (181 lb 4.8 oz)   06/29/23 0428 137/75 97.3 F (36.3 C) Axillary 54 16 97 % --     Temp (24hrs), Avg:97.8 F (36.6 C), Min:97.3 F (36.3 C), Max:98.6  F (37 C)    02/06 0701 - 02/06 1900  In: 200 [P.O.:200]  Out: 0     Physical Exam:  Constitutional: Well developed, well nourished male in no acute distress, sitting comfortably in the hospital bed.    HEENT: Normocephalic and atraumatic. Oropharynx is clear, mucous membranes are moist.  Extraocular muscles are intact.  Sclerae anicteric. Neck supple without JVD. No thyromegaly present.    Skin Warm and dry.  No bruising and no rash noted.  No erythema.  No pallor.    Neuro Grossly nonfocal with no obvious sensory or motor deficits.   MSK Normal range of motion in general.  No edema and no tenderness.   Psych Appropriate mood and affect.    Full exam per attending MD    Labs:    Recent Results (from the past 24 hour(s))   CBC with Auto Differential    Collection Time: 06/28/23 11:13 AM   Result Value Ref Range    WBC 3.7 (L) 4.3 - 11.1 K/uL    RBC 3.67 (L) 4.23 - 5.6 M/uL    Hemoglobin 10.7 (L) 13.6 - 17.2 g/dL    Hematocrit 47.6 (L) 41.1 - 50.3 %    MCV 84.7 82 - 102 FL    MCH 29.2 26.1 - 32.9 PG    MCHC 34.4 31.4 - 35.0 g/dL    RDW 54.6 (H) 50.3 - 14.6 %    Platelets 105 (L) 150 - 450 K/uL    MPV Unable to calculate. Recommend adding IPF. 9.4 - 12.3 FL    nRBC 0.00 0.0 - 0.2 K/uL    Neutrophils % 49.2 43.0 - 78.0 %    Lymphocytes % 23.8 13.0 - 44.0 %    Monocytes % 20.1  (H) 4.0 - 12.0 %    Eosinophils % 0.0 (L) 0.5 - 7.8 %    Basophils % 0.5 0.0 - 2.0 %    Immature Granulocytes % 6.4 (H) 0.0 - 5.0 %    Neutrophils Absolute 1.82 1.70 - 8.20 K/UL    Lymphocytes Absolute 0.88 0.50 - 4.60 K/UL    Monocytes Absolute 0.74 0.10 - 1.30 K/UL    Eosinophils Absolute 0.00 0.00 - 0.80 K/UL    Basophils Absolute 0.02 0.00 - 0.20 K/UL    Immature Granulocytes Absolute 0.24 0.0 - 0.5 K/UL    RBC Comment SLIGHT  ANISOCYTOSIS + POIKILOCYTOSIS        RBC Comment OCCASIONAL  SCHISTOCYTES        WBC Comment Result Confirmed By Smear      Platelet Comment SLIGHT      Differential Type AUTOMATED     CMP    Collection Time: 06/28/23 11:13 AM   Result Value Ref Range    Sodium 140 136 - 145 mmol/L    Potassium 4.4 3.5 - 5.1 mmol/L    Chloride 107 98 - 107 mmol/L    CO2 17 (L) 20 - 29 mmol/L    Anion Gap 16 7 - 16 mmol/L    Glucose 98 70 - 99 mg/dL    BUN 40 (H) 8 - 23 MG/DL    Creatinine 5.46 (H) 0.80 - 1.30 MG/DL    Est, Glom Filt Rate 39 (L) >60 ml/min/1.83m2    Calcium 8.9 8.8 - 10.2 MG/DL    Total Bilirubin 0.8 0.0 - 1.2 MG/DL    ALT 25 8 - 55 U/L    AST 37 15 - 37 U/L  Alk Phosphatase 61 40 - 129 U/L    Total Protein 6.3 6.3 - 8.2 g/dL    Albumin 3.4 3.2 - 4.6 g/dL    Globulin 2.9 2.3 - 3.5 g/dL    Albumin/Globulin Ratio 1.1 1.0 - 1.9     Magnesium    Collection Time: 06/28/23 11:13 AM   Result Value Ref Range    Magnesium 2.2 1.8 - 2.4 mg/dL   Troponin    Collection Time: 06/28/23 11:13 AM   Result Value Ref Range    Troponin T 64.0 (H) 0 - 22 ng/L   COVID-19, Rapid    Collection Time: 06/28/23 12:15 PM    Specimen: Nasopharyngeal   Result Value Ref Range    Source NASAL      SARS-CoV-2, Rapid Detected (A) NOTD     Influenza A/B, Molecular    Collection Time: 06/28/23 12:16 PM    Specimen: Nasopharyngeal   Result Value Ref Range    Influenza A, NAA Not detected NOTD      Influenza B, NAA Not detected NOTD     EKG 12 Lead    Collection Time: 06/28/23 12:34 PM   Result Value Ref Range    Ventricular  Rate 118 BPM    Atrial Rate 118 BPM    QRS Duration 93 ms    Q-T Interval 345 ms    QTc Calculation (Bazett) 484 ms    R Axis -10 degrees    T Axis 80 degrees    Diagnosis       Atrial fibrillation with premature ventricular or aberrantly conducted   complexes  Borderline prolonged QT interval    Confirmed by Tonny Bollman MD, Christiane Ha [phone]) on 06/28/2023 1:39:31 PM     Troponin    Collection Time: 06/28/23  2:03 PM   Result Value Ref Range    Troponin T 68.0 (H) 0 - 22 ng/L   Brain Natriuretic Peptide    Collection Time: 06/28/23  2:03 PM   Result Value Ref Range    NT Pro-BNP 2,539 (H) 0 - 450 PG/ML   EKG 12 Lead    Collection Time: 06/28/23  5:12 PM   Result Value Ref Range    Ventricular Rate 73 BPM    Atrial Rate 73 BPM    P-R Interval 180 ms    QRS Duration 98 ms    Q-T Interval 426 ms    QTc Calculation (Bazett) 469 ms    P Axis 38 degrees    R Axis -47 degrees    T Axis 83 degrees    Diagnosis       Normal sinus rhythm  Left axis deviation  Left ventricular hypertrophy with repolarization abnormality ( R in aVL )  Abnormal ECG  When compared with ECG of 28-Jun-2023 12:34,  PREVIOUS ECG IS PRESENT  Confirmed by Einar Pheasant  MD ([facility]), A. THOMAS ([id number]) on 06/29/2023 5:33:48 AM     Basic Metabolic Panel w/ Reflex to MG    Collection Time: 06/29/23  4:13 AM   Result Value Ref Range    Sodium 137 136 - 145 mmol/L    Potassium 4.9 3.5 - 5.1 mmol/L    Chloride 107 98 - 107 mmol/L    CO2 17 (L) 20 - 29 mmol/L    Anion Gap 13 7 - 16 mmol/L    Glucose 177 (H) 70 - 99 mg/dL    BUN 39 (H) 8 - 23 MG/DL    Creatinine 0.98 (H) 0.80 -  1.30 MG/DL    Est, Glom Filt Rate 47 (L) >60 ml/min/1.48m2    Calcium 8.4 (L) 8.8 - 10.2 MG/DL   CBC with Auto Differential    Collection Time: 06/29/23  4:13 AM   Result Value Ref Range    WBC 1.7 (LL) 4.3 - 11.1 K/uL    RBC 3.52 (L) 4.23 - 5.6 M/uL    Hemoglobin 10.2 (L) 13.6 - 17.2 g/dL    Hematocrit 82.9 (L) 41.1 - 50.3 %    MCV 84.9 82 - 102 FL    MCH 29.0 26.1 - 32.9 PG    MCHC 34.1 31.4 - 35.0 g/dL     RDW 56.2 (H) 13.0 - 14.6 %    Platelets 91 (L) 150 - 450 K/uL    MPV Unable to calculate. Recommend adding IPF. 9.4 - 12.3 FL    nRBC 0.00 0.0 - 0.2 K/uL    Neutrophils % 56.9 43.0 - 78.0 %    Lymphocytes % 27.3 13.0 - 44.0 %    Monocytes % 6.1 4.0 - 12.0 %    Eosinophils % 0.6 0.5 - 7.8 %    Basophils % 0.6 0.0 - 2.0 %    Immature Granulocytes % 8.5 (H) 0.0 - 5.0 %    Neutrophils Absolute 0.97 (L) 1.70 - 8.20 K/UL    Lymphocytes Absolute 0.46 (L) 0.50 - 4.60 K/UL    Monocytes Absolute 0.10 0.10 - 1.30 K/UL    Eosinophils Absolute 0.01 0.00 - 0.80 K/UL    Basophils Absolute 0.01 0.00 - 0.20 K/UL    Immature Granulocytes Absolute 0.14 0.0 - 0.5 K/UL    RBC Comment MODERATE  ANISOCYTOSIS + POIKILOCYTOSIS        WBC Comment Result Confirmed By Smear      Platelet Comment MODERATE      Differential Type AUTOMATED     Bilirubin Total Direct & Indirect    Collection Time: 06/29/23  4:13 AM   Result Value Ref Range    Total Bilirubin 0.4 0.0 - 1.2 MG/DL    Bilirubin, Direct <8.6 0.0 - 0.4 MG/DL    Bilirubin, Indirect Cannot be calculated 0.0 - 1.1 MG/DL   Lactate Dehydrogenase    Collection Time: 06/29/23  4:13 AM   Result Value Ref Range    LD 300 (H) 127 - 281 U/L   Urinalysis with Reflex to Culture    Collection Time: 06/29/23  4:50 AM    Specimen: Urine   Result Value Ref Range    Color, UA YELLOW/STRAW      Appearance CLEAR      Specific Gravity, UA 1.022 1.001 - 1.023      pH, Urine 5.0 5.0 - 9.0      Protein, UA Negative NEG mg/dL    Glucose, Ur Negative NEG mg/dL    Ketones, Urine TRACE (A) NEG mg/dL    Bilirubin, Urine Negative NEG      Blood, Urine Negative NEG      Urobilinogen, Urine 0.2 0.2 - 1.0 EU/dL    Nitrite, Urine Negative NEG      Leukocyte Esterase, Urine Negative NEG      Urine Culture if Indicated CULTURE NOT INDICATED BY UA RESULT      WBC, UA 0-4 U4 /hpf    RBC, UA 0-5 U5 /hpf    BACTERIA, URINE Negative NEG /hpf    Epithelial Cells, UA 0-5 U5 /hpf    Hyaline Casts, UA 2-5 /lpf  Casts 0 0 /lpf     Crystals 0 0 /LPF    Mucus, UA 0 0 /lpf   Reticulocytes    Collection Time: 06/29/23 10:15 AM   Result Value Ref Range    Reticulocyte Count,Automated 0.5 0.3 - 2.0 %    Absolute Retic # 0.0189 (L) 0.026 - 0.095 M/ul    Immature Retic Fraction 7.5 2.3 - 13.4 %    Retic Hemoglobin conc. 35 29 - 35 pg       Imaging:  Xray Result (most recent):  XR CHEST PORTABLE 06/28/2023    Narrative  Chest X-ray    INDICATION: Cough    COMPARISON: 20 June 2023    TECHNIQUE: AP view of the chest was obtained.    Impression  Findings/impression: Questionable ill-defined opacity of the retrocardiac region  without other acute cardiopulmonary finding. Dedicated PA/lateral chest would be  useful for further evaluation.    Electronically signed by Gifford Shave        ASSESSMENT:  Principal Problem:    Atrial fibrillation with rapid ventricular response (HCC)  Active Problems:    Chronic heart failure with mildly reduced ejection fraction (HFmrEF, 41-49%) (HCC)    HTN (hypertension)    HLD (hyperlipidemia)    Coronary atherosclerosis of native coronary artery    TIA (transient ischemic attack)    Amaurosis fugax of left eye    OSA (obstructive sleep apnea)    Stage 3 chronic kidney disease (HCC)    COVID    Moderate asthma with exacerbation  Resolved Problems:    * No resolved hospital problems. *    Paul Medina is a 89 y.o. male admitted on 06/28/2023. The primary encounter diagnosis was COVID-19 virus infection. Diagnoses of Dehydration, New onset atrial fibrillation (HCC), AKI (acute kidney injury) (HCC), and Atrial fibrillation, unspecified type (HCC) were also pertinent to this visit.Marland Kitchen      His PMH includes asthma, HTN, OSA, TIA, GERD, CKD3, and L eye blindness.  He is a patient of Dr. 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  He presented to ED with c/o fatigue, weakness, and dry cough.  He completed azithromycin x 2 weeks ago with no improvement in symptoms.  He also reports fall 2/2  weakness ~2 weeks ago.  On admission, WBC 3.7 / ANC 1.8, Hgb 10.7 (b/l ~8-10s), Plt 91k (b/l ~70-110s).  Cr 1.6 (b/l ~1.0 - 1.4.  pBNP 2539.  EKG with Afib with RVR, required cardizem gtt.  CXR with ?ill-defined opacity of retrocardiac region.  On Solumedrol 40mg  q 8 hours.  Flu neg.  COVID +ve.  On CTX/Doxy.  We were consulted for leukopenia and thrombocytopenia in the setting of DLBCL in remission and active COVID infection.      RECOMMENDATIONS:  DLBCL, in remission  - followed by Dr. Rulon Abide at [facility]  - s/p mini R-CHOP, comp in 03/2022    Leukopenia  - likely r/t marrow suppression from infx/medications; expect to recover over time    Anemia / Thrombocytopenia  - at baseline  - check nutritional studies, hemolysis, and DIC labs  - transfuse prn to keep Hgb >7 and Plt >10k or >50k with active bleeding    Thank you for allowing [place] to participate in the care of Paul Medina. We will sign off; please call with questions.  Will continue to monitor counts peripherally.  Paul Medina will need to f/u with his hematologist, Dr. Rulon Abide at [facility] upon discharge.  Trisha Mangle, APRN - CNP   [facility]  [address]  [place] [address]  Office : [phone]  Fax : [phone]

## 2023-06-29 NOTE — Progress Notes (Signed)
Hospitalist Progress Note   Admit Date:  06/28/2023 10:56 AM   Name:  Paul Medina   Age:  89 y.o.  Sex:  male  DOB:  07-09-1930   MRN:  [id number]   Room:  416/01    Presenting/Chief Complaint: Cold Symptoms and Dehydration     Reason(s) for Admission: Dehydration [E86.0]  New onset atrial fibrillation (HCC) [I48.91]  AKI (acute kidney injury) (HCC) [N17.9]  Atrial fibrillation with rapid ventricular response (HCC) [I48.91]  COVID-19 virus infection [U07.1]     Hospital Course:   Paul Medina is a 89 y.o. male with medical history of asthma, hypertension, sleep apnea, TIA, acid reflux, blind in his left eye, history of B-cell lymphoma.  CKD 3.       Patient says he has been feeling weaker, tired, mild dry cough going on for several weeks, recently finished a course of Zithromax about 2 weeks ago.  He also had a fall about 2 weeks ago secondary generalized weakness.  As the symptoms persisted decided to come to emergency room for further evaluation.     Apart from generalized weakness fatigue and dry cough with mild wheezing patient otherwise does not complain of any headache or dizziness or palpitation or chest pain or any nausea vomiting or abdominal pain.     WBC of 3.7, platelet of 105, hemoglobin of 10.7, bicarb of 17 creatinine 1.66, previous creatinine of 1.42, proBNP of 2539, initial troponin 64, repeat troponin of 68  EKG new A-fib with RVR, with PVCs, rate of 118  Chest x-ray-  Questionable ill-defined opacity of the retrocardiac region  without other acute cardiopulmonary finding. Dedicated PA/lateral chest would be  useful for further evaluation  Positive for COVID  For his new A-fib with RVR patient received Cardizem 10 mg IV x 1 and presently on Cardizem drip.     Patient will be admitted for asthma exasperation, pneumonia, new A-fib with RVR, mild acute on chronic kidney disease.      Subjective & 24hr Events:   Patient was seen and examined at the bedside.  He is very hard of hearing.  He  is wheezing.  He was complaining of cough and shortness of breath.  He tolerated CPAP last night.  Patient denies fever, chills, nausea, vomiting, diarrhea.      Assessment & Plan:      Atrial fibrillation with rapid ventricular response (HCC)  New A-fib with RVR,  Off Cardizem drip  On Eliquis renally dosed  Defer long-term anticoagulation to primary cardiologist in the outpatient setting.  Cardiology on board, appreciate recommendations    Pneumonia/asthma exacerbation/covid  Continue Rocephin 1 g daily, doxycycline 100 mg twice daily,   Decreased Solu-Medrol 40 mg Q12 hourly, albuterol nebs Q4 hourly, Pulmicort nebs twice daily    Chronic CHFrEF  (41-49%)   Euvolemic  Coreg/losartan     Acute on chronic kidney disease  Creatinine back to baseline, previous creatinine of 1.42  Held losartan     History of TIA  Aspirin 81 mg daily     History of B-cell lymphoma  WBC of 1.7, platelet count of 91K, hemoglobin of 10.7  Follow-up with his hematologist Dr. Rulon Abide at [facility] as outpatient  Oncology signed off, appreciate recommendations     Obstructive sleep apnea  Respiratory consult for CPAP machine     Coronary disease-patient says he was told he had cardiomegaly, never had any stent  On Coreg 3.125 mg p.o. twice daily     Hypertension  Holding off on antihypertensives as patient is normotensive    Hyperlipidemia  Crestor    Dispo anticipate hospital stay for 1 to 2 days.   PT/OT evals ordered?  Therapy evals ordered  Diet: ADULT DIET; Regular; Low Fat/Low Chol/High Fiber/2 gm Na  VTE prophylaxis: SCD's   Code status: Full Code  Code status discussed: Yes  Blood consent obtained: N/A      Non-peripheral Lines and Tubes (if present):          Telemetry (if present):  Cardiac/Telemetry Monitor On: Bedside monitor in use, Portable telemetry pack applied        Hospital Problems:  Principal Problem:    Atrial fibrillation with rapid ventricular response (HCC)  Active Problems:    Chronic heart failure with mildly reduced  ejection fraction (HFmrEF, 41-49%) (HCC)    HTN (hypertension)    HLD (hyperlipidemia)    Coronary atherosclerosis of native coronary artery    TIA (transient ischemic attack)    Amaurosis fugax of left eye    OSA (obstructive sleep apnea)    Stage 3 chronic kidney disease (HCC)    COVID    Moderate asthma with exacerbation    COVID-19 virus infection  Resolved Problems:    * No resolved hospital problems. *      Objective:   Patient Vitals for the past 24 hrs:   Temp Pulse Resp BP SpO2   06/29/23 1230 97.5 F (36.4 C) 61 16 129/79 96 %   06/29/23 1139 -- -- -- -- 95 %   06/29/23 0844 -- 85 16 -- 95 %   06/29/23 0801 97.5 F (36.4 C) 55 16 128/71 96 %   06/29/23 0428 97.3 F (36.3 C) 54 16 137/75 97 %   06/29/23 0040 97.3 F (36.3 C) 62 18 127/82 97 %   06/29/23 0015 -- 61 16 -- 97 %   06/28/23 2112 -- 68 22 -- 96 %   06/28/23 1951 98.6 F (37 C) 71 19 110/66 93 %   06/28/23 1614 97.9 F (36.6 C) 56 18 116/71 96 %   06/28/23 1536 -- (!) 111 28 -- 90 %   06/28/23 1530 -- (!) 112 19 117/76 94 %   06/28/23 1515 -- (!) 110 23 122/61 94 %   06/28/23 1514 -- (!) 107 21 107/62 92 %   06/28/23 1445 -- (!) 125 14 128/84 98 %   06/28/23 1358 -- (!) 113 20 112/87 95 %   06/28/23 1343 -- (!) 106 28 122/66 96 %   06/28/23 1328 -- (!) 106 29 114/62 96 %       Oxygen Therapy  SpO2: 96 %  Pulse via Oximetry: 108 beats per minute  Pulse Oximeter Device Mode: Intermittent  O2 Device: None (Room air)    Estimated body mass index is 29.26 kg/m as calculated from the following:    Height as of this encounter: 1.676 m (5\' 6" ).    Weight as of this encounter: 82.2 kg (181 lb 4.8 oz).    Intake/Output Summary (Last 24 hours) at 06/29/2023 1323  Last data filed at 06/29/2023 1230  Gross per 24 hour   Intake 740 ml   Output 465 ml   Net 275 ml         Physical Exam:     General:    Well nourished.  Ill-appearing  Head:  Normocephalic, atraumatic  Eyes:  Sclerae appear normal.  Pupils equally round.  ENT:  Nares  appear normal.  Moist oral  mucosa  Neck:  No restricted ROM.  Trachea midline   CV:   RRR.  No m/r/g.  No jugular venous distension.  Irregular heart beat  Lungs:   CTAB.  No  rhonchi, or rales.  Symmetric expansion.  Bilateral wheezing is present  Abdomen:   Soft, nontender, nondistended.  Extremities: No cyanosis or clubbing.  No edema  Skin:     No rashes.  Normal coloration.   Warm and dry.    Neuro:  CN II-XII grossly intact.    Psych:  Normal mood and affect.      I have personally reviewed labs and tests:  Recent Labs:  Recent Results (from the past 48 hour(s))   CBC with Auto Differential    Collection Time: 06/28/23 11:13 AM   Result Value Ref Range    WBC 3.7 (L) 4.3 - 11.1 K/uL    RBC 3.67 (L) 4.23 - 5.6 M/uL    Hemoglobin 10.7 (L) 13.6 - 17.2 g/dL    Hematocrit 16.1 (L) 41.1 - 50.3 %    MCV 84.7 82 - 102 FL    MCH 29.2 26.1 - 32.9 PG    MCHC 34.4 31.4 - 35.0 g/dL    RDW 09.6 (H) 04.5 - 14.6 %    Platelets 105 (L) 150 - 450 K/uL    MPV Unable to calculate. Recommend adding IPF. 9.4 - 12.3 FL    nRBC 0.00 0.0 - 0.2 K/uL    Neutrophils % 49.2 43.0 - 78.0 %    Lymphocytes % 23.8 13.0 - 44.0 %    Monocytes % 20.1 (H) 4.0 - 12.0 %    Eosinophils % 0.0 (L) 0.5 - 7.8 %    Basophils % 0.5 0.0 - 2.0 %    Immature Granulocytes % 6.4 (H) 0.0 - 5.0 %    Neutrophils Absolute 1.82 1.70 - 8.20 K/UL    Lymphocytes Absolute 0.88 0.50 - 4.60 K/UL    Monocytes Absolute 0.74 0.10 - 1.30 K/UL    Eosinophils Absolute 0.00 0.00 - 0.80 K/UL    Basophils Absolute 0.02 0.00 - 0.20 K/UL    Immature Granulocytes Absolute 0.24 0.0 - 0.5 K/UL    RBC Comment SLIGHT  ANISOCYTOSIS + POIKILOCYTOSIS        RBC Comment OCCASIONAL  SCHISTOCYTES        WBC Comment Result Confirmed By Smear      Platelet Comment SLIGHT      Differential Type AUTOMATED     CMP    Collection Time: 06/28/23 11:13 AM   Result Value Ref Range    Sodium 140 136 - 145 mmol/L    Potassium 4.4 3.5 - 5.1 mmol/L    Chloride 107 98 - 107 mmol/L    CO2 17 (L) 20 - 29 mmol/L    Anion Gap 16 7 - 16  mmol/L    Glucose 98 70 - 99 mg/dL    BUN 40 (H) 8 - 23 MG/DL    Creatinine 4.09 (H) 0.80 - 1.30 MG/DL    Est, Glom Filt Rate 39 (L) >60 ml/min/1.5m2    Calcium 8.9 8.8 - 10.2 MG/DL    Total Bilirubin 0.8 0.0 - 1.2 MG/DL    ALT 25 8 - 55 U/L    AST 37 15 - 37 U/L    Alk Phosphatase 61 40 - 129 U/L    Total Protein 6.3 6.3 - 8.2 g/dL    Albumin  3.4 3.2 - 4.6 g/dL    Globulin 2.9 2.3 - 3.5 g/dL    Albumin/Globulin Ratio 1.1 1.0 - 1.9     Magnesium    Collection Time: 06/28/23 11:13 AM   Result Value Ref Range    Magnesium 2.2 1.8 - 2.4 mg/dL   Troponin    Collection Time: 06/28/23 11:13 AM   Result Value Ref Range    Troponin T 64.0 (H) 0 - 22 ng/L   COVID-19, Rapid    Collection Time: 06/28/23 12:15 PM    Specimen: Nasopharyngeal   Result Value Ref Range    Source NASAL      SARS-CoV-2, Rapid Detected (A) NOTD     Influenza A/B, Molecular    Collection Time: 06/28/23 12:16 PM    Specimen: Nasopharyngeal   Result Value Ref Range    Influenza A, NAA Not detected NOTD      Influenza B, NAA Not detected NOTD     EKG 12 Lead    Collection Time: 06/28/23 12:34 PM   Result Value Ref Range    Ventricular Rate 118 BPM    Atrial Rate 118 BPM    QRS Duration 93 ms    Q-T Interval 345 ms    QTc Calculation (Bazett) 484 ms    R Axis -10 degrees    T Axis 80 degrees    Diagnosis       Atrial fibrillation with premature ventricular or aberrantly conducted   complexes  Borderline prolonged QT interval    Confirmed by Tonny Bollman MD, Christiane Ha [phone]) on 06/28/2023 1:39:31 PM     Troponin    Collection Time: 06/28/23  2:03 PM   Result Value Ref Range    Troponin T 68.0 (H) 0 - 22 ng/L   Brain Natriuretic Peptide    Collection Time: 06/28/23  2:03 PM   Result Value Ref Range    NT Pro-BNP 2,539 (H) 0 - 450 PG/ML   EKG 12 Lead    Collection Time: 06/28/23  5:12 PM   Result Value Ref Range    Ventricular Rate 73 BPM    Atrial Rate 73 BPM    P-R Interval 180 ms    QRS Duration 98 ms    Q-T Interval 426 ms    QTc Calculation (Bazett) 469 ms    P  Axis 38 degrees    R Axis -47 degrees    T Axis 83 degrees    Diagnosis       Normal sinus rhythm  Left axis deviation  Left ventricular hypertrophy with repolarization abnormality ( R in aVL )  Abnormal ECG  When compared with ECG of 28-Jun-2023 12:34,  PREVIOUS ECG IS PRESENT  Confirmed by Einar Pheasant  MD ([facility]), A. THOMAS ([id number]) on 06/29/2023 5:33:48 AM     Basic Metabolic Panel w/ Reflex to MG    Collection Time: 06/29/23  4:13 AM   Result Value Ref Range    Sodium 137 136 - 145 mmol/L    Potassium 4.9 3.5 - 5.1 mmol/L    Chloride 107 98 - 107 mmol/L    CO2 17 (L) 20 - 29 mmol/L    Anion Gap 13 7 - 16 mmol/L    Glucose 177 (H) 70 - 99 mg/dL    BUN 39 (H) 8 - 23 MG/DL    Creatinine 5.78 (H) 0.80 - 1.30 MG/DL    Est, Glom Filt Rate 47 (L) >60 ml/min/1.24m2    Calcium 8.4 (L) 8.8 -  10.2 MG/DL   CBC with Auto Differential    Collection Time: 06/29/23  4:13 AM   Result Value Ref Range    WBC 1.7 (LL) 4.3 - 11.1 K/uL    RBC 3.52 (L) 4.23 - 5.6 M/uL    Hemoglobin 10.2 (L) 13.6 - 17.2 g/dL    Hematocrit 01.0 (L) 41.1 - 50.3 %    MCV 84.9 82 - 102 FL    MCH 29.0 26.1 - 32.9 PG    MCHC 34.1 31.4 - 35.0 g/dL    RDW 27.2 (H) 53.6 - 14.6 %    Platelets 91 (L) 150 - 450 K/uL    MPV Unable to calculate. Recommend adding IPF. 9.4 - 12.3 FL    nRBC 0.00 0.0 - 0.2 K/uL    Neutrophils % 56.9 43.0 - 78.0 %    Lymphocytes % 27.3 13.0 - 44.0 %    Monocytes % 6.1 4.0 - 12.0 %    Eosinophils % 0.6 0.5 - 7.8 %    Basophils % 0.6 0.0 - 2.0 %    Immature Granulocytes % 8.5 (H) 0.0 - 5.0 %    Neutrophils Absolute 0.97 (L) 1.70 - 8.20 K/UL    Lymphocytes Absolute 0.46 (L) 0.50 - 4.60 K/UL    Monocytes Absolute 0.10 0.10 - 1.30 K/UL    Eosinophils Absolute 0.01 0.00 - 0.80 K/UL    Basophils Absolute 0.01 0.00 - 0.20 K/UL    Immature Granulocytes Absolute 0.14 0.0 - 0.5 K/UL    RBC Comment MODERATE  ANISOCYTOSIS + POIKILOCYTOSIS        WBC Comment Result Confirmed By Smear      Platelet Comment MODERATE      Differential Type AUTOMATED     Ferritin     Collection Time: 06/29/23  4:13 AM   Result Value Ref Range    Ferritin 2,952 (H) 8 - 388 NG/ML   Vitamin B12    Collection Time: 06/29/23  4:13 AM   Result Value Ref Range    Vitamin B-12 2380 (H) 193 - 986 pg/mL   Folate    Collection Time: 06/29/23  4:13 AM   Result Value Ref Range    Folate 36.6 (H) 3.1 - 17.5 ng/mL   Bilirubin Total Direct & Indirect    Collection Time: 06/29/23  4:13 AM   Result Value Ref Range    Total Bilirubin 0.4 0.0 - 1.2 MG/DL    Bilirubin, Direct <6.4 0.0 - 0.4 MG/DL    Bilirubin, Indirect Cannot be calculated 0.0 - 1.1 MG/DL   Iron and TIBC    Collection Time: 06/29/23  4:13 AM   Result Value Ref Range    Iron 46 35 - 100 ug/dL    TIBC 403 (L) 474 - 259 ug/dL    Iron % Saturation 30 20 - 50 %    UIBC 109.0 (L) 112.0 - 347.0 ug/dL   Lactate Dehydrogenase    Collection Time: 06/29/23  4:13 AM   Result Value Ref Range    LD 300 (H) 127 - 281 U/L   Vitamin D 25 Hydroxy    Collection Time: 06/29/23  4:13 AM   Result Value Ref Range    Vit D, 25-Hydroxy 67.1 30.0 - 100.0 ng/mL   Urinalysis with Reflex to Culture    Collection Time: 06/29/23  4:50 AM    Specimen: Urine   Result Value Ref Range    Color, UA YELLOW/STRAW      Appearance  CLEAR      Specific Gravity, UA 1.022 1.001 - 1.023      pH, Urine 5.0 5.0 - 9.0      Protein, UA Negative NEG mg/dL    Glucose, Ur Negative NEG mg/dL    Ketones, Urine TRACE (A) NEG mg/dL    Bilirubin, Urine Negative NEG      Blood, Urine Negative NEG      Urobilinogen, Urine 0.2 0.2 - 1.0 EU/dL    Nitrite, Urine Negative NEG      Leukocyte Esterase, Urine Negative NEG      Urine Culture if Indicated CULTURE NOT INDICATED BY UA RESULT      WBC, UA 0-4 U4 /hpf    RBC, UA 0-5 U5 /hpf    BACTERIA, URINE Negative NEG /hpf    Epithelial Cells, UA 0-5 U5 /hpf    Hyaline Casts, UA 2-5 /lpf    Casts 0 0 /lpf    Crystals 0 0 /LPF    Mucus, UA 0 0 /lpf   Reticulocytes    Collection Time: 06/29/23 10:15 AM   Result Value Ref Range    Reticulocyte Count,Automated 0.5  0.3 - 2.0 %    Absolute Retic # 0.0189 (L) 0.026 - 0.095 M/ul    Immature Retic Fraction 7.5 2.3 - 13.4 %    Retic Hemoglobin conc. 35 29 - 35 pg   Protime-INR    Collection Time: 06/29/23 10:15 AM   Result Value Ref Range    Protime 15.1 (H) 11.3 - 14.9 sec    INR 1.2     APTT    Collection Time: 06/29/23 10:15 AM   Result Value Ref Range    APTT 33.9 23.3 - 37.4 SEC   Fibrinogen    Collection Time: 06/29/23 10:15 AM   Result Value Ref Range    Fibrinogen 455 190 - 501 mg/dL   D-Dimer, Quantitative    Collection Time: 06/29/23 10:15 AM   Result Value Ref Range    D-Dimer, Quant 1.60 (H) <0.56 ug/ml(FEU)       Recent Labs     06/28/23  1215   COVID19 Detected*       Current Meds:  Current Facility-Administered Medications   Medication Dose Route Frequency    dilTIAZem 100 mg in sodium chloride 0.9 % 100 mL infusion (ADD-[facility])  2.5-15 mg/hr IntraVENous Continuous    albuterol (PROVENTIL) (2.5 MG/3ML) 0.083% nebulizer solution 2.5 mg  2.5 mg Nebulization Q6H PRN    cetirizine (ZYRTEC) tablet 10 mg  10 mg Oral Daily    pantoprazole (PROTONIX) tablet 40 mg  40 mg Oral Daily    rosuvastatin (CRESTOR) tablet 30 mg  30 mg Oral Nightly    tamsulosin (FLOMAX) capsule 0.4 mg  0.4 mg Oral Daily    sodium chloride flush 0.9 % injection 5-40 mL  5-40 mL IntraVENous 2 times per day    sodium chloride flush 0.9 % injection 5-40 mL  5-40 mL IntraVENous PRN    0.9 % sodium chloride infusion   IntraVENous PRN    ondansetron (ZOFRAN-ODT) disintegrating tablet 4 mg  4 mg Oral Q8H PRN    Or    ondansetron (ZOFRAN) injection 4 mg  4 mg IntraVENous Q6H PRN    polyethylene glycol (GLYCOLAX) packet 17 g  17 g Oral Daily PRN    acetaminophen (TYLENOL) tablet 650 mg  650 mg Oral Q6H PRN    Or    acetaminophen (TYLENOL) suppository 650 mg  650 mg Rectal Q6H PRN    prednisoLONE acetate (PRED FORTE) 1 % ophthalmic suspension 1 drop  1 drop Left Eye Daily    atropine 1 % ophthalmic solution 1 drop  1 drop Left Eye Daily    brimonidine  (ALPHAGAN) 0.2 % ophthalmic solution 1 drop  1 drop Left Eye TID    arformoterol tartrate (BROVANA) nebulizer solution 15 mcg  15 mcg Nebulization BID RT    ipratropium (ATROVENT) 0.02 % nebulizer solution 0.5 mg  0.5 mg Nebulization 4x Daily RT    cefTRIAXone (ROCEPHIN) 1,000 mg in sterile water 10 mL IV syringe  1,000 mg IntraVENous Q24H    doxycycline hyclate (VIBRAMYCIN) capsule 100 mg  100 mg Oral 2 times per day    methylPREDNISolone sodium succ (SOLU-MEDROL) 40 mg in sterile water 1 mL injection  40 mg IntraVENous Q8H    guaiFENesin (MUCINEX) extended release tablet 1,200 mg  1,200 mg Oral BID    apixaban (ELIQUIS) tablet 2.5 mg  2.5 mg Oral BID    carvedilol (COREG) tablet 6.25 mg  6.25 mg Oral BID WC    furosemide (LASIX) tablet 20 mg  20 mg Oral Daily    lactobacillus (CULTURELLE) capsule 1 capsule  1 capsule Oral Daily with breakfast       Signed:  Verdis Prime, MD    Part of this note may have been written by using a voice dictation software.  The note has been proof read but may still contain some grammatical/other typographical errors.

## 2023-06-29 NOTE — Plan of Care (Signed)
Problem: Respiratory - Adult  Goal: Achieves optimal ventilation and oxygenation  Outcome: Progressing

## 2023-06-29 NOTE — Progress Notes (Addendum)
Nutrition Assessment  Assessment Type: Initial, Positive nutrition screen  Reason for visit:  Best Practice Alert: Malnutrition Screening Tool   Malnutrition Screening Tool Score: 2    Nutrition Intervention:   Food and/or Nutrient Delivery:   Meals and Snacks:  Diet: Downgrade to Soft and Bite Size  Medical Food Supplements:   Medical food supplement therapy:  Continue Ensure Plus High Protein (high calorie high protein) 350 calories, 20 grams protein per 8 ounce serving   Coordination of Nutrition Care:  Coordination with heath care provider Provider, Dr. Mel Almond     Malnutrition Assessment:  Academy/A.S.P.E.N Clinical Malnutrition Criteria  Malnutrition Status: At risk for malnutrition (advanced age, poor intake pta, esophageal dysphagia)    Nutrition Focused Physical Exam: Unremarkable     Nutrition Assessment:  Food/Nutrition Related History: Pt provides the following nutrition hx: Notes that for the past 10 days, his appetite has been limited. Eating a small breakfast of oatmeal or cereal in the morning. Drinking fluids throughout the day. Occasionally taking soups in the afternoon. Did not take ONS at home. Does endorse needing soft foods at baseline due to esophageal dysphagia. Notes his esophagus is "bent". Per chart review; patient has received several dilations at the [place]; most recent dilation performed on 06/28/19 @ [facility].      Do You Have Any Cultural, Religious, or Ethnic Food Preferences?: No   Weight History: Limited OV weights available in EMR: 10/24/22: 170# (urology), 02/28/23: 179# (Onc), 05/11/23:184# (pulm)  CBW: 181# (06/29/23 bed scale)  Overall slight tend up in weight over the past year. Pt reports this is because he has made to a senior living facility recently and the meals are included. Notes that he now eats much more at baseline than he used to and has gained weight.   Nutrition Background:       PMH: asthma, HTN, OSA, TIA, GERD, blind in L eye, hx of B-cell lymphoma, CKD3, TIA,  B-cell lymphoma, CAD, CHF. Pt presented with symptoms of feeling weaker, tired, with mild cough, s/p fall 2 weeks ago. Admitted with COVID, new Afib, AKI on CKD.  Most recent EGD: 06/28/19- Spastic esophagus; Presbyesophagus; Nodular erythema at gastric cardia, biopsied  Nutrition Monitoring/Evaluation:  Visited with patient in room. He is in good spirits. Reports his appetite is good; but that he struggles to tolerate tough/hard foods due to esophageal dysphagia. Offered to downgrade patient to SBS diet; pt agreeable. Will continue Ensure Enlive TID for now until better able to assess PO trends. Denies N/V/C/D at this time.     Current Nutrition Therapies:  ADULT DIET; Regular  ADULT ORAL NUTRITION SUPPLEMENT; Breakfast, Lunch, Dinner; Educational psychologist Protein Oral Supplement    Current Intake:   Average Meal Intake: 76-100% (1 meal recorded) Average Supplements Intake: None Ordered      Anthropometric Measures:  Height: 167.6 cm (5' 5.98")  Current Body Wt: 82.2 kg (181 lb 3.5 oz) (2/6), Weight source: Bed scale  BMI: 29.3, Overweight (BMI 25.0-29.9)  Admission Body Weight: 81.6 kg (179 lb 14.3 oz) (2/5- bed scale)  Ideal Body Weight (Kg) (Calculated): 65 kg (142 lbs), 127.6 %  BMI Category Overweight (BMI 25.0-29.9)    Comparative Standards:  Energy (kcal/day): 1480-1808 (18-22 kcal/kg) (Kcal/kg Weight used: 82.2 kg Current  Protein (g/day): 82-99 (1-1.2 g/kg) Weight Used: (Current) 82.2 kg  Fluid (ml/day):   (1 ml/kcal)    Nutrition Diagnosis:   Inadequate protein intake related to swallowing difficulty as evidenced by  (esophageal dysphagia,  complains of struggles with intake of meat)    Nutrition Goal(s):      Active Goal: Meet at least 75% of estimated needs, by next RD assessment       Discharge Planning:    Too soon to determine    Fidel Levy, RD

## 2023-06-29 NOTE — Progress Notes (Signed)
Physical Therapy Note:    Attempted to see patient this PM 15:25 for physical therapy evaluation session. Patient OTF per RN Gabe. Will follow and re-attempt as schedule permits/patient available. Thank you,    Burley Saver, PT     Rehab Caseload Tracker

## 2023-06-29 NOTE — Plan of Care (Signed)
Will make NPO at [facility]; evaluate in the AM for potential EGD.    Su Monks, PA

## 2023-06-29 NOTE — Care Coordination-Inpatient (Addendum)
Pt presented to the ED with generalized weakness, fatigue, dry cough with mild wheezing. While in the ED, EKG revealed new AFib RVR. 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  Pt is relatively new to [place], moved down from [place] a few months ago. Family lives close by. Is fairly indep with his ADLs. 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 On droplet precautions for COVID. Denies DME needs or recent h/o falls. Denies current [facility] services. [facility] Notification ID #[id number]. PCP [facility] with [facility]. [facility] verified and able to afford home meds. Will remain available for potential d/c needs.     06/29/23 1156   Service Assessment   Patient Orientation Alert and Oriented   Cognition Alert   History Provided By Patient   Primary Caregiver Self   Support Systems Children;Family Members;Church/Faith Community;Friends/Neighbors   Patient's Healthcare Decision Maker is: Legal Next of Kin   PCP Verified by CM Yes  (S.Fanning, DO)   Last Visit to PCP Within last 3 months   Prior Functional Level Assistance with the following:;Mobility   Current Functional Level Assistance with the following:;Mobility   Can patient return to prior living arrangement Yes   Ability to make needs known: Good   Family able to assist with home care needs: Yes   Would you like for me to discuss the discharge plan with any other family members/significant others, and if so, who? 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[place])   Community Resources None   Social/Functional History   Lives With Alone   Type of Home Senior housing apartment   Home Layout One level   Home Access Level entry;Elevator   Bathroom Shower/Tub Walk-in Theme park manager Grab bars in shower;Grab bars around toilet   Bathroom Accessibility Accessible   Home Equipment None   Receives Help From Family   Prior Level of  Assist for ADLs Independent   Prior Level of Assist for [employer] Independent   Ambulation Assistance Independent   Prior Level of Assist for Transfers Independent   Occupation Retired   Building control surveyor   Type of Residence Assisted living;Apartment   Living Arrangements Alone   Potential Assistance Purchasing Medications No   Type of Home Care Services None   Patient expects to be discharged to: Independent living facility  [facility])   Services At/After Discharge   Transition of Care Consult (CM Consult) Discharge Planning   Services At/After Discharge None   Veteran Resource Information Provided? No   Mode of Transport at Discharge Other (see comment)  (Family)   Confirm Follow Up Transport Family

## 2023-06-29 NOTE — Progress Notes (Signed)
6045 -Patient had very minimal urine output tonight (drops in urinal). Patient states that he feels like he could pee, but "just can't get it out". Bladder scan - 464 ml. Intermittent straight cath order obtained per Sia Komba, NP.    [id number] - Intermittent straight cath order completed. Coude catheter was necessary to straight cath. 465 ml out.

## 2023-06-30 LAB — CBC WITH AUTO DIFFERENTIAL
Basophils %: 0.5 % (ref 0.0–2.0)
Basophils Absolute: 0.01 10*3/uL (ref 0.00–0.20)
Eosinophils %: 0 % — ABNORMAL LOW (ref 0.5–7.8)
Eosinophils Absolute: 0 10*3/uL (ref 0.00–0.80)
Hematocrit: 26.9 % — ABNORMAL LOW (ref 41.1–50.3)
Hemoglobin: 9.5 g/dL — ABNORMAL LOW (ref 13.6–17.2)
Immature Granulocytes %: 7 % — ABNORMAL HIGH (ref 0.0–5.0)
Immature Granulocytes Absolute: 0.15 10*3/uL (ref 0.0–0.5)
Lymphocytes %: 19.2 % (ref 13.0–44.0)
Lymphocytes Absolute: 0.4 10*3/uL — ABNORMAL LOW (ref 0.50–4.60)
MCH: 29.1 pg (ref 26.1–32.9)
MCHC: 35.3 g/dL — ABNORMAL HIGH (ref 31.4–35.0)
MCV: 82.5 fL (ref 82–102)
MPV: 13.2 fL — ABNORMAL HIGH (ref 9.4–12.3)
Monocytes %: 13.1 % — ABNORMAL HIGH (ref 4.0–12.0)
Monocytes Absolute: 0.28 10*3/uL (ref 0.10–1.30)
Neutrophils %: 60.2 % (ref 43.0–78.0)
Neutrophils Absolute: 1.26 10*3/uL — ABNORMAL LOW (ref 1.70–8.20)
Platelets: 99 10*3/uL — ABNORMAL LOW (ref 150–450)
RBC: 3.26 M/uL — ABNORMAL LOW (ref 4.23–5.6)
RDW: 14.5 % (ref 11.9–14.6)
WBC: 2.1 10*3/uL — ABNORMAL LOW (ref 4.3–11.1)
nRBC: 0 10*3/uL (ref 0.0–0.2)

## 2023-06-30 LAB — PHOSPHORUS: Phosphorus: 3.1 mg/dL (ref 2.5–4.5)

## 2023-06-30 LAB — BASIC METABOLIC PANEL W/ REFLEX TO MG FOR LOW K
Anion Gap: 13 mmol/L (ref 7–16)
BUN: 45 mg/dL — ABNORMAL HIGH (ref 8–23)
CO2: 17 mmol/L — ABNORMAL LOW (ref 20–29)
Calcium: 8.6 mg/dL — ABNORMAL LOW (ref 8.8–10.2)
Chloride: 107 mmol/L (ref 98–107)
Creatinine: 1.48 mg/dL — ABNORMAL HIGH (ref 0.80–1.30)
Est, Glom Filt Rate: 44 mL/min/{1.73_m2} — ABNORMAL LOW (ref >60)
Glucose: 191 mg/dL — ABNORMAL HIGH (ref 70–99)
Potassium: 4.5 mmol/L (ref 3.5–5.1)
Sodium: 137 mmol/L (ref 136–145)

## 2023-06-30 LAB — HAPTOGLOBIN: Haptoglobin: 230 mg/dL — ABNORMAL HIGH (ref 30–200)

## 2023-06-30 MED ORDER — PREDNISONE 20 MG PO TABS
20 MG | Freq: Every day | ORAL | Status: AC
Start: 2023-06-30 — End: 2023-07-06
  Administered 2023-07-01 – 2023-07-03 (×3): 40 mg via ORAL

## 2023-06-30 MED ORDER — SODIUM BICARBONATE 650 MG PO TABS
650 | Freq: Four times a day (QID) | ORAL | Status: AC
Start: 2023-06-30 — End: 2023-06-30
  Administered 2023-06-30 – 2023-07-01 (×4): 650 mg via ORAL

## 2023-06-30 MED ORDER — EMPAGLIFLOZIN 10 MG PO TABS
10 MG | Freq: Every day | ORAL | Status: AC
Start: 2023-06-30 — End: 2023-07-03
  Administered 2023-06-30 – 2023-07-03 (×4): 10 mg via ORAL

## 2023-06-30 MED ORDER — FUROSEMIDE 20 MG PO TABS
20 MG | ORAL_TABLET | Freq: Every day | ORAL | 3 refills | Status: DC
Start: 2023-06-30 — End: 2023-07-14

## 2023-06-30 MED ORDER — PREDNISONE 20 MG PO TABS
20 MG | ORAL_TABLET | Freq: Every day | ORAL | 0 refills | Status: DC
Start: 2023-06-30 — End: 2023-07-14

## 2023-06-30 MED ORDER — SODIUM BICARBONATE 650 MG PO TABS
650 MG | ORAL_TABLET | Freq: Four times a day (QID) | ORAL | 0 refills | Status: DC
Start: 2023-06-30 — End: 2023-07-14

## 2023-06-30 MED ORDER — CEFPODOXIME PROXETIL 200 MG PO TABS
200 MG | ORAL_TABLET | Freq: Two times a day (BID) | ORAL | 0 refills | Status: DC
Start: 2023-06-30 — End: 2023-07-14

## 2023-06-30 MED ORDER — DOXYCYCLINE HYCLATE 100 MG PO CAPS
100 | ORAL_CAPSULE | Freq: Two times a day (BID) | ORAL | 0 refills | Status: AC
Start: 2023-06-30 — End: 2023-07-03

## 2023-06-30 MED ORDER — APIXABAN 2.5 MG PO TABS
2.5 | ORAL_TABLET | Freq: Two times a day (BID) | ORAL | 0 refills | Status: AC
Start: 2023-06-30 — End: 2024-05-30

## 2023-06-30 MED ORDER — CARVEDILOL 6.25 MG PO TABS
6.25 | ORAL_TABLET | Freq: Two times a day (BID) | ORAL | 0 refills | Status: DC
Start: 2023-06-30 — End: 2024-03-21

## 2023-06-30 MED ORDER — EMPAGLIFLOZIN 10 MG PO TABS
10 MG | ORAL_TABLET | Freq: Every day | ORAL | 0 refills | Status: DC
Start: 2023-06-30 — End: 2023-07-14

## 2023-06-30 MED ORDER — IPRATROPIUM BROMIDE 0.02 % IN SOLN
0.02 % | Freq: Four times a day (QID) | RESPIRATORY_TRACT | Status: AC
Start: 2023-06-30 — End: 2023-07-03
  Administered 2023-06-30 – 2023-07-03 (×10): 0.5 mg via RESPIRATORY_TRACT

## 2023-06-30 MED ORDER — BIOTENE DRY MOUTH MOISTURIZING MT SOLN
OROMUCOSAL | Status: AC | PRN
Start: 2023-06-30 — End: 2023-07-03
  Administered 2023-07-01: 22:00:00 via ORAL

## 2023-06-30 MED FILL — BENZOCAINE-MENTHOL 15-2.6 MG MT LOZG: 15-2.6 MG | OROMUCOSAL | Qty: 1

## 2023-06-30 MED FILL — JARDIANCE 10 MG PO TABS: 10 MG | ORAL | Qty: 1

## 2023-06-30 MED FILL — MUCUS RELIEF ER 600 MG PO TB12: 600 MG | ORAL | Qty: 2

## 2023-06-30 MED FILL — METHYLPREDNISOLONE SODIUM SUCC 40 MG IJ SOLR: 40 MG | INTRAMUSCULAR | Qty: 40

## 2023-06-30 MED FILL — ONDANSETRON 4 MG PO TBDP: 4 MG | ORAL | Qty: 1

## 2023-06-30 MED FILL — PANTOPRAZOLE SODIUM 40 MG PO TBEC: 40 MG | ORAL | Qty: 1

## 2023-06-30 MED FILL — CARVEDILOL 6.25 MG PO TABS: 6.25 MG | ORAL | Qty: 1

## 2023-06-30 MED FILL — IPRATROPIUM BROMIDE 0.02 % IN SOLN: 0.02 % | RESPIRATORY_TRACT | Qty: 2.5

## 2023-06-30 MED FILL — CULTURELLE PO CAPS: ORAL | Qty: 1

## 2023-06-30 MED FILL — FUROSEMIDE 20 MG PO TABS: 20 MG | ORAL | Qty: 1

## 2023-06-30 MED FILL — MOUTH KOTE MT SOLN: OROMUCOSAL | Qty: 60

## 2023-06-30 MED FILL — DOXYCYCLINE HYCLATE 100 MG PO CAPS: 100 MG | ORAL | Qty: 1

## 2023-06-30 MED FILL — ARFORMOTEROL TARTRATE 15 MCG/2ML IN NEBU: 152 MCG/2ML | RESPIRATORY_TRACT | Qty: 2

## 2023-06-30 MED FILL — SODIUM BICARBONATE 650 MG PO TABS: 650 MG | ORAL | Qty: 1

## 2023-06-30 MED FILL — CETIRIZINE HCL 10 MG PO TABS: 10 MG | ORAL | Qty: 1

## 2023-06-30 MED FILL — CEFTRIAXONE SODIUM 1 G IJ SOLR: 1 g | INTRAMUSCULAR | Qty: 1000

## 2023-06-30 MED FILL — TAMSULOSIN HCL 0.4 MG PO CAPS: 0.4 MG | ORAL | Qty: 1

## 2023-06-30 MED FILL — ROSUVASTATIN CALCIUM 10 MG PO TABS: 10 MG | ORAL | Qty: 1

## 2023-06-30 MED FILL — ELIQUIS 2.5 MG PO TABS: 2.5 MG | ORAL | Qty: 1

## 2023-06-30 NOTE — Progress Notes (Signed)
Hospitalist Progress Note   Admit Date:  06/28/2023 10:56 AM   Name:  Paul Medina   Age:  89 y.o.  Sex:  male  DOB:  10/01/1930   MRN:  [id number]   Room:  416/01    Presenting/Chief Complaint: Cold Symptoms and Dehydration     Reason(s) for Admission: Dehydration [E86.0]  New onset atrial fibrillation (HCC) [I48.91]  AKI (acute kidney injury) (HCC) [N17.9]  Atrial fibrillation with rapid ventricular response (HCC) [I48.91]  COVID-19 virus infection [U07.1]     Hospital Course:   Paul Medina is a 89 y.o. male with medical history of asthma, hypertension, sleep apnea, TIA, acid reflux, blind in his left eye, history of B-cell lymphoma.  CKD 3.       Patient says he has been feeling weaker, tired, mild dry cough going on for several weeks, recently finished a course of Zithromax about 2 weeks ago.  He also had a fall about 2 weeks ago secondary generalized weakness.  As the symptoms persisted decided to come to emergency room for further evaluation.     Apart from generalized weakness fatigue and dry cough with mild wheezing patient otherwise does not complain of any headache or dizziness or palpitation or chest pain or any nausea vomiting or abdominal pain.     WBC of 3.7, platelet of 105, hemoglobin of 10.7, bicarb of 17 creatinine 1.66, previous creatinine of 1.42, proBNP of 2539, initial troponin 64, repeat troponin of 68  EKG new A-fib with RVR, with PVCs, rate of 118  Chest x-ray-  Questionable ill-defined opacity of the retrocardiac region  without other acute cardiopulmonary finding. Dedicated PA/lateral chest would be  useful for further evaluation  Positive for COVID  For his new A-fib with RVR patient received Cardizem 10 mg IV x 1 and presently on Cardizem drip.     Patient will be admitted for asthma exasperation, pneumonia, new A-fib with RVR, mild acute on chronic kidney disease.      Subjective & 24hr Events:   Patient was seen and examined at the bedside. He is very hard of hearing. His  wheezing improved. He was complaining of cough and shortness of breath. Patient denies fever, chills, nausea, vomiting, diarrhea.      Assessment & Plan:   Atrial fibrillation with rapid ventricular response (HCC)  New A-fib with RVR,  Off Cardizem drip  Rate controlled with coreg  On Eliquis renally dosed  Defer long-term anticoagulation to primary cardiologist in the outpatient setting.  Cardiology on board, appreciate recommendations    Pneumonia/asthma exacerbation/covid  Continue Rocephin 1 g daily, doxycycline 100 mg twice daily,   Transitioned to prednisone, albuterol nebs Q4 hourly, Pulmicort nebs twice daily    Chronic CHFrEF  (41-49%)   Euvolemic  Coreg     Acute on chronic kidney disease  Creatinine back to baseline, previous creatinine of 1.42  Held losartan     History of TIA  Aspirin 81 mg daily     History of B-cell lymphoma  Follow-up with his hematologist Dr. Rulon Abide at [facility] as outpatient  Oncology signed off, appreciate recommendations     Obstructive sleep apnea  Respiratory consult for CPAP machine     Coronary disease-patient says he was told he had cardiomegaly, never had any stent  On Coreg 3.125 mg p.o. twice daily     Hypertension  Holding off on antihypertensives as patient is normotensive    Hyperlipidemia  Crestor    Dysphagia  SLP evaluated and  started on diet  GI signed off, appreciate recommendations    PT/OT recommended STR. Most likely next week as it is [place].     Dispo anticipate hospital stay for 1 to 2 days.     PT/OT evals ordered?  Therapy evals ordered  Diet: ADULT DIET; Regular; Low Fat/Low Chol/High Fiber/2 gm Na  VTE prophylaxis: SCD's   Code status: Full Code  Code status discussed: Yes  Blood consent obtained: N/A      Non-peripheral Lines and Tubes (if present):          Telemetry (if present):  Cardiac/Telemetry Monitor On: Portable telemetry pack applied        Hospital Problems:  Principal Problem:    Atrial fibrillation with rapid ventricular response (HCC)  Active  Problems:    Chronic heart failure with mildly reduced ejection fraction (HFmrEF, 41-49%) (HCC)    HTN (hypertension)    HLD (hyperlipidemia)    Coronary atherosclerosis of native coronary artery    TIA (transient ischemic attack)    Amaurosis fugax of left eye    OSA (obstructive sleep apnea)    Stage 3 chronic kidney disease (HCC)    COVID    Moderate asthma with exacerbation    COVID-19 virus infection    Pancytopenia (HCC)  Resolved Problems:    * No resolved hospital problems. *      Objective:   Patient Vitals for the past 24 hrs:   Temp Pulse Resp BP SpO2   06/30/23 1143 97.9 F (36.6 C) 63 18 136/72 97 %   06/30/23 0822 97.3 F (36.3 C) 64 18 127/68 96 %   06/30/23 0756 -- 62 16 -- 95 %   06/30/23 0424 97.5 F (36.4 C) 60 17 (!) 155/70 97 %   06/30/23 0100 97.5 F (36.4 C) 58 17 136/69 95 %   06/29/23 2021 97.7 F (36.5 C) 65 17 125/64 95 %   06/29/23 1630 97.5 F (36.4 C) 65 16 132/75 95 %       Oxygen Therapy  SpO2: 97 %  Pulse via Oximetry: 108 beats per minute  Pulse Oximeter Device Mode: Intermittent  O2 Device: None (Room air)  O2 Flow Rate (L/min): 2 L/min    Estimated body mass index is 29.28 kg/m as calculated from the following:    Height as of this encounter: 1.676 m (5' 5.98").    Weight as of this encounter: 82.2 kg (181 lb 4.8 oz).    Intake/Output Summary (Last 24 hours) at 06/30/2023 1353  Last data filed at 06/30/2023 0440  Gross per 24 hour   Intake 420 ml   Output 400 ml   Net 20 ml         Physical Exam:     General:    Well nourished.  Ill-appearing  Head:  Normocephalic, atraumatic  Eyes:  Sclerae appear normal.  Pupils equally round.  ENT:  Nares appear normal.  Moist oral mucosa  Neck:  No restricted ROM.  Trachea midline   CV:   RRR.  No m/r/g.  No jugular venous distension.  Irregular heart beat  Lungs:   CTAB.  No  rhonchi, or rales.  Symmetric expansion.  Bilateral wheezing is present  Abdomen:   Soft, nontender, nondistended.  Extremities: No cyanosis or clubbing.  No  edema  Skin:     No rashes.  Normal coloration.   Warm and dry.    Neuro:  CN II-XII grossly intact.  Psych:  Normal mood and affect.      I have personally reviewed labs and tests:  Recent Labs:  Recent Results (from the past 48 hour(s))   Troponin    Collection Time: 06/28/23  2:03 PM   Result Value Ref Range    Troponin T 68.0 (H) 0 - 22 ng/L   Brain Natriuretic Peptide    Collection Time: 06/28/23  2:03 PM   Result Value Ref Range    NT Pro-BNP 2,539 (H) 0 - 450 PG/ML   EKG 12 Lead    Collection Time: 06/28/23  5:12 PM   Result Value Ref Range    Ventricular Rate 73 BPM    Atrial Rate 73 BPM    P-R Interval 180 ms    QRS Duration 98 ms    Q-T Interval 426 ms    QTc Calculation (Bazett) 469 ms    P Axis 38 degrees    R Axis -47 degrees    T Axis 83 degrees    Diagnosis       Normal sinus rhythm  Left axis deviation  Left ventricular hypertrophy with repolarization abnormality ( R in aVL )  Abnormal ECG  When compared with ECG of 28-Jun-2023 12:34,  PREVIOUS ECG IS PRESENT  Confirmed by Einar Pheasant  MD ([facility]), A. THOMAS ([id number]) on 06/29/2023 5:33:48 AM     Basic Metabolic Panel w/ Reflex to MG    Collection Time: 06/29/23  4:13 AM   Result Value Ref Range    Sodium 137 136 - 145 mmol/L    Potassium 4.9 3.5 - 5.1 mmol/L    Chloride 107 98 - 107 mmol/L    CO2 17 (L) 20 - 29 mmol/L    Anion Gap 13 7 - 16 mmol/L    Glucose 177 (H) 70 - 99 mg/dL    BUN 39 (H) 8 - 23 MG/DL    Creatinine 6.04 (H) 0.80 - 1.30 MG/DL    Est, Glom Filt Rate 47 (L) >60 ml/min/1.62m2    Calcium 8.4 (L) 8.8 - 10.2 MG/DL   CBC with Auto Differential    Collection Time: 06/29/23  4:13 AM   Result Value Ref Range    WBC 1.7 (LL) 4.3 - 11.1 K/uL    RBC 3.52 (L) 4.23 - 5.6 M/uL    Hemoglobin 10.2 (L) 13.6 - 17.2 g/dL    Hematocrit 54.0 (L) 41.1 - 50.3 %    MCV 84.9 82 - 102 FL    MCH 29.0 26.1 - 32.9 PG    MCHC 34.1 31.4 - 35.0 g/dL    RDW 98.1 (H) 19.1 - 14.6 %    Platelets 91 (L) 150 - 450 K/uL    MPV Unable to calculate. Recommend adding IPF. 9.4 -  12.3 FL    nRBC 0.00 0.0 - 0.2 K/uL    Neutrophils % 56.9 43.0 - 78.0 %    Lymphocytes % 27.3 13.0 - 44.0 %    Monocytes % 6.1 4.0 - 12.0 %    Eosinophils % 0.6 0.5 - 7.8 %    Basophils % 0.6 0.0 - 2.0 %    Immature Granulocytes % 8.5 (H) 0.0 - 5.0 %    Neutrophils Absolute 0.97 (L) 1.70 - 8.20 K/UL    Lymphocytes Absolute 0.46 (L) 0.50 - 4.60 K/UL    Monocytes Absolute 0.10 0.10 - 1.30 K/UL    Eosinophils Absolute 0.01 0.00 - 0.80 K/UL    Basophils Absolute 0.01 0.00 -  0.20 K/UL    Immature Granulocytes Absolute 0.14 0.0 - 0.5 K/UL    RBC Comment MODERATE  ANISOCYTOSIS + POIKILOCYTOSIS        WBC Comment Result Confirmed By Smear      Platelet Comment MODERATE      Differential Type AUTOMATED     Ferritin    Collection Time: 06/29/23  4:13 AM   Result Value Ref Range    Ferritin 2,952 (H) 8 - 388 NG/ML   Vitamin B12    Collection Time: 06/29/23  4:13 AM   Result Value Ref Range    Vitamin B-12 2380 (H) 193 - 986 pg/mL   Folate    Collection Time: 06/29/23  4:13 AM   Result Value Ref Range    Folate 36.6 (H) 3.1 - 17.5 ng/mL   Bilirubin Total Direct & Indirect    Collection Time: 06/29/23  4:13 AM   Result Value Ref Range    Total Bilirubin 0.4 0.0 - 1.2 MG/DL    Bilirubin, Direct <1.3 0.0 - 0.4 MG/DL    Bilirubin, Indirect Cannot be calculated 0.0 - 1.1 MG/DL   Iron and TIBC    Collection Time: 06/29/23  4:13 AM   Result Value Ref Range    Iron 46 35 - 100 ug/dL    TIBC 086 (L) 578 - 469 ug/dL    Iron % Saturation 30 20 - 50 %    UIBC 109.0 (L) 112.0 - 347.0 ug/dL   Lactate Dehydrogenase    Collection Time: 06/29/23  4:13 AM   Result Value Ref Range    LD 300 (H) 127 - 281 U/L   Vitamin D 25 Hydroxy    Collection Time: 06/29/23  4:13 AM   Result Value Ref Range    Vit D, 25-Hydroxy 67.1 30.0 - 100.0 ng/mL   Urinalysis with Reflex to Culture    Collection Time: 06/29/23  4:50 AM    Specimen: Urine   Result Value Ref Range    Color, UA YELLOW/STRAW      Appearance CLEAR      Specific Gravity, UA 1.022 1.001 - 1.023       pH, Urine 5.0 5.0 - 9.0      Protein, UA Negative NEG mg/dL    Glucose, Ur Negative NEG mg/dL    Ketones, Urine TRACE (A) NEG mg/dL    Bilirubin, Urine Negative NEG      Blood, Urine Negative NEG      Urobilinogen, Urine 0.2 0.2 - 1.0 EU/dL    Nitrite, Urine Negative NEG      Leukocyte Esterase, Urine Negative NEG      Urine Culture if Indicated CULTURE NOT INDICATED BY UA RESULT      WBC, UA 0-4 U4 /hpf    RBC, UA 0-5 U5 /hpf    BACTERIA, URINE Negative NEG /hpf    Epithelial Cells, UA 0-5 U5 /hpf    Hyaline Casts, UA 2-5 /lpf    Casts 0 0 /lpf    Crystals 0 0 /LPF    Mucus, UA 0 0 /lpf   Reticulocytes    Collection Time: 06/29/23 10:15 AM   Result Value Ref Range    Reticulocyte Count,Automated 0.5 0.3 - 2.0 %    Absolute Retic # 0.0189 (L) 0.026 - 0.095 M/ul    Immature Retic Fraction 7.5 2.3 - 13.4 %    Retic Hemoglobin conc. 35 29 - 35 pg   Haptoglobin    Collection Time:  06/29/23 10:15 AM   Result Value Ref Range    Haptoglobin 230 (H) 30 - 200 mg/dL   Protime-INR    Collection Time: 06/29/23 10:15 AM   Result Value Ref Range    Protime 15.1 (H) 11.3 - 14.9 sec    INR 1.2     APTT    Collection Time: 06/29/23 10:15 AM   Result Value Ref Range    APTT 33.9 23.3 - 37.4 SEC   Fibrinogen    Collection Time: 06/29/23 10:15 AM   Result Value Ref Range    Fibrinogen 455 190 - 501 mg/dL   D-Dimer, Quantitative    Collection Time: 06/29/23 10:15 AM   Result Value Ref Range    D-Dimer, Quant 1.60 (H) <0.56 ug/ml(FEU)   Echo (TTE) complete (PRN contrast/bubble/strain/3D)    Collection Time: 06/29/23 10:57 AM   Result Value Ref Range    LV EDV A2C 209 mL    LV EDV A4C 182 mL    LV ESV A2C 143 mL    LV ESV A4C 129 mL    IVSd 0.9 0.6 - 1.0 cm    LVIDd 6.5 (A) 4.2 - 5.9 cm    LVIDs 5.4 cm    LVOT Diameter 2.0 cm    LVOT Mean Gradient 1 mmHg    LVOT VTI 16.5 cm    LVOT Peak Velocity 0.8 m/s    LVOT Peak Gradient 2 mmHg    LVPWd 0.9 0.6 - 1.0 cm    LV E' Lateral Velocity 3.60 cm/s    LV E' Septal Velocity 3.70 cm/s    LV  Ejection Fraction A2C 32 %    LV Ejection Fraction A4C 29 %    EF BP 30 (A) 55 - 100 %    LVOT Area 3.1 cm2    LVOT SV 51.8 ml    LA Minor Axis 5.4 cm    LA Major Axis 6.4 cm    LA Area 2C 18.1 cm2    LA Area 4C 25.3 cm2    LA Volume MOD A2C 47 18 - 58 mL    LA Volume MOD A4C 82 (A) 18 - 58 mL    LA Volume BP 67 (A) 18 - 58 mL    LA Diameter 5.0 cm    AR PHT 641.0 ms    AR Max Velocity PISA 3.9 m/s    AV Peak Velocity 1.8 m/s    AV Peak Gradient 13 mmHg    AV Mean Velocity 1.3 m/s    AV Mean Gradient 8 mmHg    AV VTI 37.4 cm    AV Area by VTI 1.4 cm2    AV Area by Peak Velocity 1.3 cm2    Aortic Root 3.7 cm    Ascending Aorta 3.4 cm    MR VTI 226.0 cm    MV Mean Gradient 3 mmHg    MV VTI 39.1 cm    MV Mean Velocity 0.9 m/s    MR Peak Velocity 5.4 m/s    MR Peak Gradient 117 mmHg    MV Max Velocity 1.4 m/s    MV Peak Gradient 8 mmHg    MV E Wave Deceleration Time 172.0 ms    MV A Velocity 1.27 m/s    MV E Velocity 0.73 m/s    MV Area by VTI 1.3 cm2    PV AT 74.0 ms    PV Max Velocity 1.0 m/s    PV Peak  Gradient 4 mmHg    RV Basal Dimension 2.8 cm    RV Free Wall Peak S' 13.5 cm/s    TAPSE 2.2 1.7 cm    TR Max Velocity 2.66 m/s    TR Peak Gradient 28 mmHg    Body Surface Area 1.95 m2    Fractional Shortening 2D 17 28 - 44 %    LV ESV Index A4C 67 mL/m2    LV EDV Index A4C 95 mL/m2    LV ESV Index A2C 74 mL/m2    LV EDV Index A2C 109 mL/m2    LVIDd Index 3.39 cm/m2    LVIDs Index 2.81 cm/m2    LV RWT Ratio 0.28     LV Mass 2D 247.8 (A) 88 - 224 g    LV Mass 2D Index 129.1 (A) 49 - 115 g/m2    MV E/A 0.57     E/E' Ratio (Averaged) 20.00     E/E' Lateral 20.28     E/E' Septal 19.73     LA Volume Index BP 35 (A) 16 - 34 ml/m2    LVOT Stroke Volume Index 27.0 mL/m2    LA Volume Index MOD A2C 24 16 - 34 ml/m2    LA Volume Index MOD A4C 43 (A) 16 - 34 ml/m2    LA Size Index 2.60 cm/m2    LA/AO Root Ratio 1.35     Ao Root Index 1.93 cm/m2    Ascending Aorta Index 1.77 cm/m2    AV Velocity Ratio 0.44     LVOT:AV VTI Index 0.44      AVA/BSA VTI 0.7 cm2/m2    AVA/BSA Peak Velocity 0.7 cm2/m2    AV:WUJW VTI Index 2.37    CBC with Auto Differential    Collection Time: 06/30/23  4:06 AM   Result Value Ref Range    WBC 2.1 (L) 4.3 - 11.1 K/uL    RBC 3.26 (L) 4.23 - 5.6 M/uL    Hemoglobin 9.5 (L) 13.6 - 17.2 g/dL    Hematocrit 11.9 (L) 41.1 - 50.3 %    MCV 82.5 82 - 102 FL    MCH 29.1 26.1 - 32.9 PG    MCHC 35.3 (H) 31.4 - 35.0 g/dL    RDW 14.7 82.9 - 56.2 %    Platelets 99 (L) 150 - 450 K/uL    MPV 13.2 (H) 9.4 - 12.3 FL    nRBC 0.00 0.0 - 0.2 K/uL    Neutrophils % 60.2 43.0 - 78.0 %    Lymphocytes % 19.2 13.0 - 44.0 %    Monocytes % 13.1 (H) 4.0 - 12.0 %    Eosinophils % 0.0 (L) 0.5 - 7.8 %    Basophils % 0.5 0.0 - 2.0 %    Immature Granulocytes % 7.0 (H) 0.0 - 5.0 %    Neutrophils Absolute 1.26 (L) 1.70 - 8.20 K/UL    Lymphocytes Absolute 0.40 (L) 0.50 - 4.60 K/UL    Monocytes Absolute 0.28 0.10 - 1.30 K/UL    Eosinophils Absolute 0.00 0.00 - 0.80 K/UL    Basophils Absolute 0.01 0.00 - 0.20 K/UL    Immature Granulocytes Absolute 0.15 0.0 - 0.5 K/UL    RBC Comment MODERATE  ANISOCYTOSIS + POIKILOCYTOSIS        WBC Comment Result Confirmed By Smear      Platelet Comment SLIGHT      Differential Type AUTOMATED     Basic Metabolic Panel w/ Reflex to MG  Collection Time: 06/30/23  4:06 AM   Result Value Ref Range    Sodium 137 136 - 145 mmol/L    Potassium 4.5 3.5 - 5.1 mmol/L    Chloride 107 98 - 107 mmol/L    CO2 17 (L) 20 - 29 mmol/L    Anion Gap 13 7 - 16 mmol/L    Glucose 191 (H) 70 - 99 mg/dL    BUN 45 (H) 8 - 23 MG/DL    Creatinine 1.61 (H) 0.80 - 1.30 MG/DL    Est, Glom Filt Rate 44 (L) >60 ml/min/1.63m2    Calcium 8.6 (L) 8.8 - 10.2 MG/DL   Phosphorus    Collection Time: 06/30/23  4:06 AM   Result Value Ref Range    Phosphorus 3.1 2.5 - 4.5 MG/DL       Recent Labs     09/60/45  1215   COVID19 Detected*       Current Meds:  Current Facility-Administered Medications   Medication Dose Route Frequency    sodium bicarbonate tablet 650 mg  650  mg Oral 4x Daily    empagliflozin (JARDIANCE) tablet 10 mg  10 mg Oral Daily    saliva substitute (BIOTENE/MOUTH KOTE) liquid   Oral PRN    ipratropium (ATROVENT) 0.02 % nebulizer solution 0.5 mg  0.5 mg Nebulization Q6H WA RT    methylPREDNISolone sodium succ (SOLU-MEDROL) 40 mg in sterile water 1 mL injection  40 mg IntraVENous Q12H    Benzocaine-Menthol (CEPACOL SORE THROAT) 15-2.6 MG lozenge 1 lozenge  1 lozenge Oral Q2H PRN    dilTIAZem 100 mg in sodium chloride 0.9 % 100 mL infusion (ADD-Vantage)  2.5-15 mg/hr IntraVENous Continuous    albuterol (PROVENTIL) (2.5 MG/3ML) 0.083% nebulizer solution 2.5 mg  2.5 mg Nebulization Q6H PRN    cetirizine (ZYRTEC) tablet 10 mg  10 mg Oral Daily    pantoprazole (PROTONIX) tablet 40 mg  40 mg Oral Daily    rosuvastatin (CRESTOR) tablet 30 mg  30 mg Oral Nightly    tamsulosin (FLOMAX) capsule 0.4 mg  0.4 mg Oral Daily    sodium chloride flush 0.9 % injection 5-40 mL  5-40 mL IntraVENous 2 times per day    sodium chloride flush 0.9 % injection 5-40 mL  5-40 mL IntraVENous PRN    0.9 % sodium chloride infusion   IntraVENous PRN    ondansetron (ZOFRAN-ODT) disintegrating tablet 4 mg  4 mg Oral Q8H PRN    Or    ondansetron (ZOFRAN) injection 4 mg  4 mg IntraVENous Q6H PRN    polyethylene glycol (GLYCOLAX) packet 17 g  17 g Oral Daily PRN    acetaminophen (TYLENOL) tablet 650 mg  650 mg Oral Q6H PRN    Or    acetaminophen (TYLENOL) suppository 650 mg  650 mg Rectal Q6H PRN    prednisoLONE acetate (PRED FORTE) 1 % ophthalmic suspension 1 drop  1 drop Left Eye Daily    atropine 1 % ophthalmic solution 1 drop  1 drop Left Eye Daily    brimonidine (ALPHAGAN) 0.2 % ophthalmic solution 1 drop  1 drop Left Eye TID    arformoterol tartrate (BROVANA) nebulizer solution 15 mcg  15 mcg Nebulization BID RT    cefTRIAXone (ROCEPHIN) 1,000 mg in sterile water 10 mL IV syringe  1,000 mg IntraVENous Q24H    doxycycline hyclate (VIBRAMYCIN) capsule 100 mg  100 mg Oral 2 times per day     guaiFENesin (MUCINEX) extended release tablet  1,200 mg  1,200 mg Oral BID    apixaban (ELIQUIS) tablet 2.5 mg  2.5 mg Oral BID    carvedilol (COREG) tablet 6.25 mg  6.25 mg Oral BID WC    furosemide (LASIX) tablet 20 mg  20 mg Oral Daily    lactobacillus (CULTURELLE) capsule 1 capsule  1 capsule Oral Daily with breakfast       Signed:  Verdis Prime, MD    Part of this note may have been written by using a voice dictation software.  The note has been proof read but may still contain some grammatical/other typographical errors.

## 2023-06-30 NOTE — Progress Notes (Signed)
Physician Progress Note      PATIENT:               Paul Medina, Paul Medina  CSN #:                  [id number]  DOB:                       Jun 11, 1930  ADMIT DATE:       06/28/2023 10:56 AM  DISCH DATE:  RESPONDING  PROVIDER #:        Verdis Prime MD          QUERY TEXT:    Patient admitted with + Covid PNA, new onset A fib,  maintained on Eliquis. If   possible, please document in progress notes and discharge summary if you are   evaluating and/or treating any of the following:?  ?  The medical record reflects the following:  Risk Factors: advanced age of 89 yo, HTN, CAD  Clinical Indicators: CD CN CHA2DS2VASC 7 (age +36, HTN, CAD, HF, TIA +2).  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    Query created by: [place], Susan on 06/30/2023 12:50 PM      Electronically signed by:  Verdis Prime MD 06/30/2023 12:55 PM

## 2023-06-30 NOTE — Progress Notes (Signed)
[facility] PROGRESS NOTE           06/30/2023 9:02 AM    Admit Date: 06/28/2023      Subjective:     In sinus rhythm.  On room air.  States improved symptoms compared to presentation.  Denies any chest discomfort.    ROS:  Cardiovascular:  As noted above    Objective:      Vitals:    06/30/23 0100 06/30/23 0424 06/30/23 0756 06/30/23 0822   BP: 136/69 (!) 155/70  127/68   Pulse: 58 60 62 64   Resp: 17 17 16 18    Temp: 97.5 F (36.4 C) 97.5 F (36.4 C)  97.3 F (36.3 C)   TempSrc: Axillary Oral  Oral   SpO2: 95% 97% 95% 96%   Weight:       Height:           Physical Exam:  General-No Acute Distress  Neck- supple, no JVD  CV- regular rate and rhythm no MRG  Lung- clear bilaterally  Abd- soft, nontender, nondistended  Ext- no edema bilaterally.  Skin- warm and dry    Data Review:   Recent Labs     06/28/23  1113 06/29/23  0413 06/29/23  1015 06/30/23  0406   NA 140 137  --  137   K 4.4 4.9  --  4.5   MG 2.2  --   --   --    BUN 40* 39*  --  45*   WBC 3.7* 1.7*  --  2.1*   HGB 10.7* 10.2*  --  9.5*   HCT 31.1* 29.9*  --  26.9*   PLT 105* 91*  --  99*   INR  --   --  1.2  --        Assessment/Plan:     Principal Problem:    Atrial fibrillation with rapid ventricular response (HCC)  -Currently in sinus rhythm; likely triggered by acute underlying issues.  -Has been initiated on Eliquis; defer long-term anticoagulation to primary cardiologist in the outpatient setting.  No prior history of AF.  -Closely monitor Eliquis dosing based on renal function.  Creatinine currently borderline and on low-dose Eliquis.  Reassess prior to discharge.    Active Problems:    Chronic heart failure with mildly reduced ejection fraction (HFmrEF, 41-49%) (HCC)  -Stated mild left ventricular dysfunction from [facility] from echo from 05/2020; prior remote PCI to the LAD.  -Continue Coreg/losartan; exam currently euvolemic.  Echocardiogram with moderate left ventricular dysfunction from current study.  Add on SGLT2 inhibitor.   Consideration for MRA and reassess during hospital course based on renal function.  Deferred at this time      HTN (hypertension)  -Target less than 130/80.      HLD (hyperlipidemia)  -Continue current Crestor      COVID    Moderate asthma with exacerbation  -Per primary team       Lenna Gilford, MD  06/30/2023 9:02 AM

## 2023-06-30 NOTE — Progress Notes (Signed)
ACUTE PHYSICAL THERAPY GOALS:   (Developed with and agreed upon by patient and/or caregiver.)  LTG:  (1.)Paul Medina will move from supine to sit and sit to supine  and roll side to side in flat bed without siderails with  INDEPENDENT within 7 day(s).    (2.)Paul Medina will perform all functional transfers with  MODIFIED INDEPENDENCE using the least restrictive/no device within 7 day(s).    (3.)Paul Medina will ambulate with  MODIFIED INDEPENDENCE for 250 feet with normal vital sign response with the least restrictive device within 7 day(s).          PHYSICAL THERAPY Initial Assessment and Daily Note  (Link to Caseload Tracking: PT Visit Days : 1  Acknowledge Orders  Time In/Out  PT Charge Capture  Rehab Caseload Tracker    Paul Medina is Paul Medina 89 y.o. male   PRIMARY DIAGNOSIS: Atrial fibrillation with rapid ventricular response (HCC)  Dehydration [E86.0]  New onset atrial fibrillation (HCC) [I48.91]  AKI (acute kidney injury) (HCC) [N17.9]  Atrial fibrillation with rapid ventricular response (HCC) [I48.91]  COVID-19 virus infection [U07.1]       Reason for Referral: Other abnormalities of gait and mobility (R26.89)  History of falling (Z91.81)  Inpatient: Payor: [employer] / Plan: [employer] / Product Type: *No Product type* /     ASSESSMENT:     REHAB RECOMMENDATIONS:   Recommendation to date pending progress:  Setting:  Short-term Rehab    Equipment:    Rolling Walker     ASSESSMENT:  Paul Medina was admitted with above.  He lives alone in ILF and is typically modified independent using either Kella Splinter rollator or cane.  Patient reports that he has been feeling so poorly lately he has been unable to shower and has fallen at home.  Patient today was given CGA to min Camaryn Lumbert for all functional mobility.  He did experience slight loss of balance during ambulation at times.  Patient instructed to perform ankle pumps and straight leg raise while sitting in recliner ad lib.  He was fatigued at end of session.    spO2 remained >90% on room air.  Patient has declined in functional mobility.  Paul Medina would benefit from skilled physical therapy while in acute care (medically necessary) to address his deficits and maximize his function.   [employer] AM-PACT "6 Clicks" Basic Mobility Inpatient Short Form  AM-PAC Basic Mobility - Inpatient   How much help is needed turning from your back to your side while in Adlene Adduci flat bed without using bedrails?: Dejane Scheibe Little  How much help is needed moving from lying on your back to sitting on the side of Evalyne Cortopassi flat bed without using bedrails?: Ryota Treece Little  How much help is needed moving to and from Cope Marte bed to Sharry Beining chair?: Santiago Stenzel Little  How much help is needed standing up from Joeanne Robicheaux chair using your arms?: Tyshae Stair Little  How much help is needed walking in hospital room?: Thaniel Coluccio Little  How much help is needed climbing 3-5 steps with Laneta Guerin railing?: Lurine Imel Little  AM-PAC Inpatient Mobility Raw Score : 18  AM-PAC Inpatient T-Scale Score : 43.63  Mobility Inpatient CMS 0-100% Score: 46.58  Mobility Inpatient CMS G-Code Modifier : CK    SUBJECTIVE:   Paul Medina states, "thank you"     Social/Functional Lives With: Alone  Type of Home: Senior housing apartment  Home Layout: One level  Home Access: Level entry, Occupational psychologist Shower/Tub: Walk-in  shower  Bathroom Toilet: Midwife: Grab bars in shower, Grab bars around toilet  Bathroom Accessibility: Accessible  Home Equipment: Gilmer Mor, Engineer, production Help From: Family  Prior Level of Assist for ADLs: Independent  Prior Level of Assist for [employer]: Independent  Prior Level of Assist for Ambulation: Independent household ambulator, with or without device  Prior Level of Assist for Transfers: Independent  Occupation: Retired    OBJECTIVE:     PAIN: VITALS / O2: PRECAUTION / LINES / DRAINS:   Pre Treatment:   Pain Assessment: None - Denies Pain      Post Treatment: 0 Vitals        Oxygen      Telemetry     RESTRICTIONS/PRECAUTIONS:                     GROSS EVALUATION: LE Intact Impaired (Comments):   AROM [x]      PROM []     Strength [x]      Balance []  Sitting - Static: Good  Standing - Static: Fair  Standing - Dynamic: Poor   Posture []  Forward Head  Rounded Shoulders   Sensation []      Coordination [x]       Tone [x]      Edema []     Activity Tolerance []  Patient limited by fatigue    []       COGNITION/  PERCEPTION: Intact Impaired (Comments):   Orientation []      Vision []      Hearing []   HOH   Cognition  []   Some memory loss, follows commands     MOBILITY: I Mod I S SBA CGA Min Mod Max Total  NT x2 Comments:   Bed Mobility    Rolling []  []  []  []  []  []  []  []  []  []  []     Supine to Sit []  []  []  []  [x]  []  []  []  []  []  []     Scooting []  []  []  []  []  []  []  []  []  []  []     Sit to Supine []  []  []  []  []  []  []  []  []  []  []     Transfers    Sit to Stand []  []  []  []  [x]  [x]  []  []  []  []  []     Bed to Chair []  []  []  []  []  [x]  []  []  []  []  []     Stand to Sit []  []  []  []  [x]  [x]  []  []  []  []  []      []  []  []  []  []  []  []  []  []  []  []     I=Independent, Mod I=Modified Independent, S=Supervision, SBA=Standby Assistance, CGA=Contact Guard Assistance,   Min=Minimal Assistance, Mod=Moderate Assistance, Max=Maximal Assistance, Total=Total Assistance, NT=Not Tested    GAIT: I Mod I S SBA CGA Min Mod Max Total  NT x2 Comments:   Level of Assistance []  []  []  []  []  [x]  []  []  []  []  []     Distance 30  feet    DME Gait Belt and Rolling Walker    Gait Quality Decreased cadence , Decreased step clearance, Decreased step length, and Wide base of support    Weightbearing Status      Stairs      I=Independent, Mod I=Modified Independent, S=Supervision, SBA=Standby Assistance, CGA=Contact Guard Assistance,   Min=Minimal Assistance, Mod=Moderate Assistance, Max=Maximal Assistance, Total=Total Assistance, NT=Not Tested    PLAN:   FREQUENCY AND DURATION: 3 times/week for duration of hospital stay or until stated goals are met, whichever comes first.    THERAPY PROGNOSIS: Excellent    PROBLEM LIST:    (  Skilled intervention is medically necessary to address:)  Decreased ADL/Functional Activities  Decreased Activity Tolerance  Decreased AROM/PROM  Decreased Balance  Decreased Cognition  Decreased Gait Ability  Decreased Transfer Abilities INTERVENTIONS PLANNED:   (Benefits and precautions of physical therapy have been discussed with the patient.)  Self Care Training  Therapeutic Activity  Therapeutic Exercise/HEP  Gait Training  Education       TREATMENT:   EVALUATION: MODERATE COMPLEXITY: (Untimed Charge)  The initial evaluation charge encompasses clinical chart review, objective assessment, interpretation of assessment, and skilled monitoring of the patient's response to treatment in order to develop Shynia Daleo plan of care.     TREATMENT:   Co-Treatment PT/OT necessary due to patient's decreased overall endurance/tolerance levels, as well as need for high level skilled assistance to complete functional transfers/mobility and functional tasks  Therapeutic Activity (23 Minutes): Therapeutic activity included Supine to Sit, Scooting, Transfer Training, Ambulation on level ground, Sitting balance , and Standing balance to improve functional Activity tolerance, Balance, Mobility, and Strength.    TREATMENT GRID:  N/Auset Fritzler    DATE: 06/30/23       Ambulation        Hip Flexion        Long Arc Quads        Hip ab/ad        Heel Raises X5 AB       Toe Raises X5 AB       SLR X3 AB                        Key:  Kristianne Albin=active, AA=active assisted, P=passive, B=bilaterally, R=right, L=left   DF=dorsiflexion, PF=plantarflexion      AFTER TREATMENT PRECAUTIONS: Alarm Activated, Bed, Call light within reach, Chair, Needs within reach, and RN notified    INTERDISCIPLINARY COLLABORATION:  RN/ PCT, PT/ PTA, and OT/ COTA    EDUCATION: Education Given To: Patient  Education Provided: Role of Therapy;Plan of Care;Home Exercise Program  Education Outcome: Continued education needed  Educated patient and/or family/caregiver on the following: Assistive  device indications/utilization/safety    TIME IN/OUT:  Time In: 0942  Time Out: 1013  Minutes: 31    Nakeya Adinolfi Arnetha Courser, PT

## 2023-06-30 NOTE — Consults (Signed)
Consult Note            Date:06/30/2023        Patient Name:Paul Medina     Date of Birth:[birth year]     Age:89 y.o.    Inpatient consult to GI  Consult performed by: Berta Minor, APRN - CNP  Consult ordered by: Verdis Prime, MD          Chief Complaint     Chief Complaint   Patient presents with    Cold Symptoms    Dehydration        History Obtained From   patient    History of Present Illness   Paul Medina is a 89 yo male who presented to the ED with weakness, fatigue and mild dry cough. PMH includes asthma, HTN, sleep apnea, TIA, acid reflux, DLBCL, and CKD3. GI was consulted for dysphagia. Patient states currently he has no issues swallowing except when his mouth is dry. He has issues with dry mouth and he states at home he has a CPAP that he places water in and that usually helps with his dry mouth but he doesn't have it here with him. When he has dry mouth he states it is just hard to chew the food and he has difficulty initiating the swallow and feels suffocated. When his mouth is moist he has no difficulty with initiating a swallow or any dysphagia symptoms. 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 When he eats dry meats or fish it feels like it gets stuck at the bottom of his esophagus and he has to drink a lot of liquid for the meat to go down. He states he tolerates soft foods and liquids.    He denies any nausea, vomiting or abdominal pain. He is on Eliquis which was given last night. He denies NSAID use. He denies tobacco or alcohol use. He denies any GERD symptoms. He has had unintentional weight loss the last 2-3 weeks but he isn't sure how much. He also has associated loss of appetite. His last EGD was in 2021 where they did dilation but as he stated it did not help.    Patient is on Eliquis and last dose was  last night.     Labs: Hgb 9.5, Hct 26.9    Medication: PPI    Past Medical History     Past Medical History:   Diagnosis Date    Arthritis     Asthma     Chronic bronchitis (HCC)     Hypertension     Lung cancer [facility])         Past Surgical History   No past surgical history on file.     Medications     Prior to Admission medications    Medication Sig Start Date End Date Taking? Authorizing Provider   ascorbic acid (VITAMIN C) 500 MG tablet Take 1 tablet by mouth daily    [provider]   lactobacillus (CULTURELLE) CAPS capsule Take 1 capsule by mouth daily    [provider]   lidocaine (LIDODERM) 5 % Place 1 patch onto the skin daily    [provider]   loratadine (CLARITIN) 10 MG tablet Take 1 tablet by mouth daily    [provider]   losartan (  COZAAR) 25 MG tablet 1 tablet daily    [provider]   Multiple Vitamin (MULTIVITAMIN) tablet Take 1 tablet by mouth daily    [provider]   pantoprazole (PROTONIX) 40 MG tablet Take 1 tablet by mouth daily    [provider]   prednisoLONE acetate (PRED FORTE) 1 % ophthalmic suspension 1 drop 2 times daily    [provider]   rosuvastatin (CRESTOR) 10 MG tablet 3 tablets daily    [provider]   tamsulosin (FLOMAX) 0.4 MG capsule Take 1 capsule by mouth daily    [provider]   sildenafil (VIAGRA) 100 MG tablet Take 1 tablet by mouth as needed    [provider]   terazosin (HYTRIN) 5 MG capsule 2 capsules daily    [provider]   valsartan (DIOVAN) 40 MG tablet Take 1 tablet by mouth 2 times daily    [provider]   tiotropium-olodaterol (STIOLTO) 2.5-2.5 MCG/ACT AERS Inhale 2 puffs into the lungs daily 05/11/23   El F[place]wood, [place], MD   albuterol sulfate HFA (PROVENTIL;VENTOLIN;PROAIR) 108 (90 Base) MCG/ACT inhaler Inhale 2 puffs into the lungs every 4 hours as needed for Shortness of Breath 05/11/23   El Villarreal, [place], MD        sodium  bicarbonate tablet 650 mg, 4x Daily  empagliflozin (JARDIANCE) tablet 10 mg, Daily  saliva substitute (BIOTENE/MOUTH KOTE) liquid, PRN  methylPREDNISolone sodium succ (SOLU-MEDROL) 40 mg in sterile water 1 mL injection, Q12H  Benzocaine-Menthol (CEPACOL SORE THROAT) 15-2.6 MG lozenge 1 lozenge, Q2H PRN  dilTIAZem 100 mg in sodium chloride 0.9 % 100 mL infusion (ADD-Vantage), Continuous  albuterol (PROVENTIL) (2.5 MG/3ML) 0.083% nebulizer solution 2.5 mg, Q6H PRN  cetirizine (ZYRTEC) tablet 10 mg, Daily  pantoprazole (PROTONIX) tablet 40 mg, Daily  rosuvastatin (CRESTOR) tablet 30 mg, Nightly  tamsulosin (FLOMAX) capsule 0.4 mg, Daily  sodium chloride flush 0.9 % injection 5-40 mL, 2 times per day  sodium chloride flush 0.9 % injection 5-40 mL, PRN  0.9 % sodium chloride infusion, PRN  ondansetron (ZOFRAN-ODT) disintegrating tablet 4 mg, Q8H PRN   Or  ondansetron (ZOFRAN) injection 4 mg, Q6H PRN  polyethylene glycol (GLYCOLAX) packet 17 g, Daily PRN  acetaminophen (TYLENOL) tablet 650 mg, Q6H PRN   Or  acetaminophen (TYLENOL) suppository 650 mg, Q6H PRN  prednisoLONE acetate (PRED FORTE) 1 % ophthalmic suspension 1 drop, Daily  atropine 1 % ophthalmic solution 1 drop, Daily  brimonidine (ALPHAGAN) 0.2 % ophthalmic solution 1 drop, TID  arformoterol tartrate (BROVANA) nebulizer solution 15 mcg, BID RT  ipratropium (ATROVENT) 0.02 % nebulizer solution 0.5 mg, 4x Daily RT  cefTRIAXone (ROCEPHIN) 1,000 mg in sterile water 10 mL IV syringe, Q24H  doxycycline hyclate (VIBRAMYCIN) capsule 100 mg, 2 times per day  guaiFENesin (MUCINEX) extended release tablet 1,200 mg, BID  apixaban (ELIQUIS) tablet 2.5 mg, BID  carvedilol (COREG) tablet 6.25 mg, BID WC  furosemide (LASIX) tablet 20 mg, Daily  lactobacillus (CULTURELLE) capsule 1 capsule, Daily with breakfast        Allergies   Ciprofloxacin, Doxycycline, Lisinopril, Other, Sulfa antibiotics, Sulfasalazine, and Sulfur    Social History     Social History       Tobacco  History       Smoking Status  Former Smoking Start Date  05/31/1954 Quit Date  1990 Average Packs/Day  1 pack/day for 34.0 years (34.0 ttl pk-yrs) Smoking Tobacco Type  Cigarettes from 05/31/1954 to 1990  Pack Year History     Packs/Day From To Years    0 1990  35.1    1 05/31/1954 1990 34.0      Smokeless Tobacco Use  Never              Alcohol History       Alcohol Use Status  Never              Drug Use       Drug Use Status  Never              Sexual Activity       Sexually Active  Not Currently Partners  Male                    Family History   No family history on file.    Review of Systems   Review of Systems   Constitutional:  Positive for appetite change and unexpected weight change.   HENT:  Positive for trouble swallowing (with dry mouth).    Gastrointestinal:  Negative for abdominal pain, nausea and vomiting.        Physical Exam   BP 127/68   Pulse 64   Temp 97.3 F (36.3 C) (Oral)   Resp 18   Ht 1.676 m (5' 5.98")   Wt 82.2 kg (181 lb 4.8 oz)   SpO2 96%   BMI 29.28 kg/m      Physical Exam  Vitals and nursing note reviewed.   Constitutional:       Appearance: Normal appearance.   HENT:      Head: Normocephalic and atraumatic.      Mouth/Throat:      Mouth: Mucous membranes are dry.   Eyes:      Conjunctiva/sclera: Conjunctivae normal.   Cardiovascular:      Heart sounds: Normal heart sounds.   Abdominal:      General: Bowel sounds are normal. There is no distension.      Palpations: Abdomen is soft.      Tenderness: There is no abdominal tenderness.   Neurological:      Mental Status: He is alert and oriented to person, place, and time.         Labs    CBC:  Recent Labs     06/28/23  1113 06/29/23  0413 06/30/23  0406   WBC 3.7* 1.7* 2.1*   RBC 3.67* 3.52* 3.26*   HGB 10.7* 10.2* 9.5*   HCT 31.1* 29.9* 26.9*   MCV 84.7 84.9 82.5   RDW 14.9* 14.7* 14.5   PLT 105* 91* 99*     CHEMISTRIES:  Recent Labs     06/28/23  1113 06/29/23  0413 06/30/23  0406   NA 140 137 137   K 4.4 4.9 4.5   CL 107 107 107    CO2 17* 17* 17*   BUN 40* 39* 45*   CREATININE 1.66* 1.40* 1.48*   GLUCOSE 98 177* 191*   PHOS  --   --  3.1   MG 2.2  --   --      PT/INR:  Recent Labs     06/29/23  1015   PROTIME 15.1*   INR 1.2     APTT:  Recent Labs     06/29/23  1015   APTT 33.9     LIVER PROFILE:  Recent Labs     06/28/23  1113 06/29/23  0413   AST 37  --  ALT 25  --    BILIDIR  --  <0.2   BILITOT 0.8 0.4   ALKPHOS 61  --        Imaging/Diagnostics   CT CHEST PULMONARY EMBOLISM W CONTRAST    Result Date: 06/29/2023  1.  No pulmonary embolism. No thoracic aortic aneurysm/dissection. 2.  Bibasilar pneumonia, greater on the left with trace parapneumonic effusions. 3.  No definite thoracic metastatic disease. 4.  Stable left adrenal nodule. Electronically signed by Elsie Stain    XR CHEST PORTABLE    Result Date: 06/28/2023  Findings/impression: Questionable ill-defined opacity of the retrocardiac region without other acute cardiopulmonary finding. Dedicated PA/lateral chest would be useful for further evaluation. Electronically signed by Wallis Mart Problems             Last Modified POA    * (Principal) Atrial fibrillation with rapid ventricular response (HCC) 06/28/2023 Yes    Chronic heart failure with mildly reduced ejection fraction (HFmrEF, 41-49%) (HCC) 06/28/2023 Yes    HTN (hypertension) 06/28/2023 Yes    HLD (hyperlipidemia) 06/28/2023 Yes    Coronary atherosclerosis of native coronary artery 06/28/2023 Yes    TIA (transient ischemic attack) 06/28/2023 Yes    Amaurosis fugax of left eye 06/28/2023 Yes    OSA (obstructive sleep apnea) 06/28/2023 Yes    Stage 3 chronic kidney disease (HCC) 06/28/2023 Yes    COVID 06/28/2023 Yes    Moderate asthma with exacerbation 06/28/2023 Yes    COVID-19 virus infection 06/29/2023 Yes    Pancytopenia (HCC) 06/29/2023 Yes       Plan   1. Dysphagia: 89 yo male who presented with weakness, fatigue and dry cough. He does have COVID. He has a history of dysphagia with dry meats or fish where it "gets  stuck" at the bottom of his esophagus and he has to use a lot of liquids to get it to go down. He currently has no difficulties with swallowing unless he has a dry mouth which he has difficulty initiating a swallow and feels like he is suffocating. Will order mouth kote to help with the dry mouth and recommend mints or lemon to help with production of saliva. Discussed with Dr. Antonieta Loveless and recommend speech consult and moist/minced diet.     Electronically signed by Berta Minor, APRN - CNP on 06/30/23 at 9:32 AM EST

## 2023-06-30 NOTE — Discharge Summary (Signed)
Hospitalist Discharge Summary   Admit Date:  06/28/2023 10:56 AM   DC Note date: 06/30/2023  Name:  Paul Medina   Age:  89 y.o.  Sex:  male  DOB:  02/26/1931   MRN:  [id number]   Room:  416/01  PCP:  Unknown, Provider, ANP    Presenting Complaint: Cold Symptoms and Dehydration     Initial Admission Diagnosis: Dehydration [E86.0]  New onset atrial fibrillation (HCC) [I48.91]  AKI (acute kidney injury) (HCC) [N17.9]  Atrial fibrillation with rapid ventricular response (HCC) [I48.91]  COVID-19 virus infection [U07.1]     Problem List for this Hospitalization (present on admission):    Principal Problem:    Atrial fibrillation with rapid ventricular response (HCC)  Active Problems:    Chronic heart failure with mildly reduced ejection fraction (HFmrEF, 41-49%) (HCC)    HTN (hypertension)    HLD (hyperlipidemia)    Coronary atherosclerosis of native coronary artery    TIA (transient ischemic attack)    Amaurosis fugax of left eye    OSA (obstructive sleep apnea)    Stage 3 chronic kidney disease (HCC)    COVID    Moderate asthma with exacerbation    COVID-19 virus infection    Pancytopenia (HCC)  Resolved Problems:    * No resolved hospital problems. *      Hospital Course:  Paul Medina is a 89 y.o. male with medical history of asthma, hypertension, sleep apnea, TIA, acid reflux, blind in his left eye, history of B-cell lymphoma.  CKD 3 presented with  feeling weaker, tired, mild dry cough, recently finished a course of Zithromax. He also had a fall. As the symptoms persisted decided to come to emergency room for further evaluation.  WBC of 3.7, platelet of 105, hemoglobin of 10.7, bicarb of 17 creatinine 1.66, previous creatinine of 1.42, proBNP of 2539, initial troponin 64, repeat troponin of 68.  EKG new A-fib with RVR, with PVCs, rate of 118.  Chest x-ray- Questionable ill-defined opacity of the retrocardiac region  without other acute cardiopulmonary finding. Positive for COVID  For his new A-fib with RVR  patient received Cardizem 10 mg IV x 1 and presently on Cardizem drip.  He was treated with IV Solu-Medrol and IV antibiotics.  He was off of Cardizem and was in normal sinus rhythm.  Cardiology was consulted.  He was anticoagulated with Eliquis.  Defer long-term anticoagulation to his primary cardiologist in the outpatient setting.  He was saturating well on room air.  He was transitioned to p.o. steroids.  His volume status is improved.  His creatinine improved back to baseline and losartan and Lasix was resumed.  He had leukopenia and oncologist was consulted.  Follow-up with Dr. Rulon Abide and placement as outpatient.  He complained of dysphagia.  SLP evaluated and started on soft diet.  GI was consulted and no EGD is required.  PT/OT recommended HHT.  Patient is hemodynamically stable for discharge.     Patient will be admitted for asthma exasperation, pneumonia, new A-fib with RVR, mild acute on chronic kidney disease.    Disposition: Home with Home Health  Diet: ADULT DIET; Dysphagia - Minced and Moist  ADULT ORAL NUTRITION SUPPLEMENT; Breakfast, Lunch, Dinner; Standard High Calorie/High Protein Oral Supplement  Code Status: Full Code    Follow Ups:  Follow-up Information       Follow up With Specialties Details Why Contact Info    Dallie Piles, DO Hematology and Oncology, Hematology, Oncology Follow up in  1 week(s)  [address]  [place] [address]  [phone]              Future Appointments         Provider Specialty Dept Phone    11/02/2023 2:45 PM (Arrive by 2:30 PM) Cresenciano Lick, APRN - CNP Pulmonology [phone]              Follow up labs/diagnostics (ultimately defer to outpatient provider):  Follow-up with PCP in 3 to 5 days  Follow-up with cardiology in 1 month    Plan was discussed with Patient.  All questions answered.  Patient was stable at time of discharge.  Instructions given to call a physician or return if any concerns.        Current Discharge Medication List         START taking these medications    Details   sodium bicarbonate 650 MG tablet Take 1 tablet by mouth 4 times daily for 10 days  Qty: 40 tablet, Refills: 0      apixaban (ELIQUIS) 2.5 MG TABS tablet Take 1 tablet by mouth 2 times daily  Qty: 60 tablet, Refills: 0      empagliflozin (JARDIANCE) 10 MG tablet Take 1 tablet by mouth daily  Qty: 30 tablet, Refills: 0      carvedilol (COREG) 6.25 MG tablet Take 1 tablet by mouth 2 times daily (with meals)  Qty: 60 tablet, Refills: 0      predniSONE (DELTASONE) 20 MG tablet Take 2 tablets by mouth daily for 5 doses  Qty: 10 tablet, Refills: 0      furosemide (LASIX) 20 MG tablet Take 1 tablet by mouth daily  Qty: 60 tablet, Refills: 3      doxycycline hyclate (VIBRAMYCIN) 100 MG capsule Take 1 capsule by mouth every 12 hours for 6 doses  Qty: 6 capsule, Refills: 0      cefpodoxime (VANTIN) 200 MG tablet Take 1 tablet by mouth 2 times daily for 5 days  Qty: 10 tablet, Refills: 0           CONTINUE these medications which have NOT CHANGED    Details   ascorbic acid (VITAMIN C) 500 MG tablet Take 1 tablet by mouth daily      lactobacillus (CULTURELLE) CAPS capsule Take 1 capsule by mouth daily      lidocaine (LIDODERM) 5 % Place 1 patch onto the skin daily      loratadine (CLARITIN) 10 MG tablet Take 1 tablet by mouth daily      losartan (COZAAR) 25 MG tablet 1 tablet daily      Multiple Vitamin (MULTIVITAMIN) tablet Take 1 tablet by mouth daily      pantoprazole (PROTONIX) 40 MG tablet Take 1 tablet by mouth daily      prednisoLONE acetate (PRED FORTE) 1 % ophthalmic suspension 1 drop 2 times daily      rosuvastatin (CRESTOR) 10 MG tablet 3 tablets daily      tamsulosin (FLOMAX) 0.4 MG capsule Take 1 capsule by mouth daily      sildenafil (VIAGRA) 100 MG tablet Take 1 tablet by mouth as needed      terazosin (HYTRIN) 5 MG capsule 2 capsules daily      tiotropium-olodaterol (STIOLTO) 2.5-2.5 MCG/ACT AERS Inhale 2 puffs into the lungs daily  Qty: 1 each, Refills: 11       albuterol sulfate HFA (PROVENTIL;VENTOLIN;PROAIR) 108 (90 Base) MCG/ACT inhaler Inhale 2 puffs into the lungs  every 4 hours as needed for Shortness of Breath  Qty: 1 each, Refills: 11           STOP taking these medications       valsartan (DIOVAN) 40 MG tablet Comments:   Reason for Stopping:               Some of the medications may be marked as "stop taking" by the system; but in reality patient or family reported already being off these meds; defer to outpatient/prescribing providers.      Procedures done this admission:  * No surgery found *    Consults this admission:  IP CONSULT TO CARDIOLOGY  IP CONSULT TO RESPIRATORY CARE  IP CONSULT TO ONCOLOGY  IP CONSULT TO GI  IP CONSULT HOME HEALTH    Consultants will arrange their own follow ups, if applicable.    Echocardiogram results:  06/28/23    ECHO (TTE) COMPLETE (PRN CONTRAST/BUBBLE/STRAIN/3D) 06/29/2023  3:43 PM (Final)    Interpretation Summary    Left Ventricle: Moderately reduced left ventricular systolic function with a visually estimated EF of 30 - 35%. Left ventricle is moderately dilated. Normal wall thickness. Grade II diastolic dysfunction with increased LAP.    Aortic Valve: Mild regurgitation. Mild stenosis of the aortic valve (DI ~0.44).    Mitral Valve: Mild to moderate regurgitation with a posterior directed jet.    Left Atrium: Left atrium is mildly dilated.    Image quality is adequate. Contrast used: Lumason.    Signed by: Lenna Gilford, MD on 06/29/2023  3:43 PM      Diagnostic Imaging/Tests:   CT CHEST PULMONARY EMBOLISM W CONTRAST    Result Date: 06/29/2023  1.  No pulmonary embolism. No thoracic aortic aneurysm/dissection. 2.  Bibasilar pneumonia, greater on the left with trace parapneumonic effusions. 3.  No definite thoracic metastatic disease. 4.  Stable left adrenal nodule. Electronically signed by Elsie Stain    XR CHEST PORTABLE    Result Date: 06/28/2023  Findings/impression: Questionable ill-defined opacity of the retrocardiac region  without other acute cardiopulmonary finding. Dedicated PA/lateral chest would be useful for further evaluation. Electronically signed by Gifford Shave    CT Head W/O Contrast    Result Date: 06/20/2023  No CT evidence of acute intracranial abnormality. Electronically signed by Lala Lund    XR CHEST PORTABLE    Result Date: 06/20/2023  No acute findings in the chest Electronically signed by Sandi Mealy       Labs: Results:       BMP, Mg, Phos Recent Labs     06/28/23  1113 06/29/23  0413 06/30/23  0406   NA 140 137 137   K 4.4 4.9 4.5   CL 107 107 107   CO2 17* 17* 17*   ANIONGAP 16 13 13    BUN 40* 39* 45*   CREATININE 1.66* 1.40* 1.48*   LABGLOM 39* 47* 44*   CALCIUM 8.9 8.4* 8.6*   GLUCOSE 98 177* 191*   MG 2.2  --   --    PHOS  --   --  3.1      CBC Recent Labs     06/28/23  1113 06/29/23  0413 06/30/23  0406   WBC 3.7* 1.7* 2.1*   RBC 3.67* 3.52* 3.26*   HGB 10.7* 10.2* 9.5*   HCT 31.1* 29.9* 26.9*   MCV 84.7 84.9 82.5   MCH 29.2 29.0 29.1   MCHC 34.4 34.1 35.3*  RDW 14.9* 14.7* 14.5   PLT 105* 91* 99*   MPV Unable to calculate. Recommend adding IPF. Unable to calculate. Recommend adding IPF. 13.2*   NRBC 0.00 0.00 0.00   LYMPHOPCT 23.8 27.3 19.2   MONOPCT 20.1* 6.1 13.1*   BASOPCT 0.5 0.6 0.5   IMMGRAN 6.4* 8.5* 7.0*   LYMPHSABS 0.88 0.46* 0.40*   EOSABS 0.00 0.01 0.00   MONOSABS 0.74 0.10 0.28   BASOSABS 0.02 0.01 0.01   ABSIMMGRAN 0.24 0.14 0.15      LFT Recent Labs     06/28/23  1113 06/29/23  0413   BILITOT 0.8 0.4   BILIDIR  --  <0.2   ALKPHOS 61  --    AST 37  --    ALT 25  --    PROTEIN 6.3  --    ALBUMIN 3.4  --    GLOB 2.9  --       Cardiac  No results found for: "NTPROBNP", "TROPHS"   Coags Lab Results   Component Value Date/Time    PROTIME 15.1 06/29/2023 10:15 AM    INR 1.2 06/29/2023 10:15 AM    APTT 33.9 06/29/2023 10:15 AM      A1c No results found for: "LABA1C", "EAG"   Lipids No results found for: "CHOL", "HDL", "CHOLHDLRATIO", "TRIG"   Thyroid  No results found for: "TSHELE", "TSH3GEN"      Most Recent UA Lab Results   Component Value Date/Time    COLORU YELLOW/STRAW 06/29/2023 04:50 AM    APPEARANCE CLEAR 06/29/2023 04:50 AM    PROTEINU Negative 06/29/2023 04:50 AM    GLUCOSEU Negative 06/29/2023 04:50 AM    KETUA TRACE 06/29/2023 04:50 AM    BILIRUBINUR Negative 06/29/2023 04:50 AM    BLOODU Negative 06/29/2023 04:50 AM    UROBILINOGEN 0.2 06/29/2023 04:50 AM    NITRU Negative 06/29/2023 04:50 AM    LEUKOCYTESUR Negative 06/29/2023 04:50 AM    WBCUA 0-4 06/29/2023 04:50 AM    RBCUA 0-5 06/29/2023 04:50 AM    BACTERIA Negative 06/29/2023 04:50 AM    LABCAST 0 06/29/2023 04:50 AM    MUCUS 0 06/29/2023 04:50 AM        Microbiology:  Results       Procedure Component Value Units Date/Time    Influenza A/B, Molecular [[id number]] Collected: 06/28/23 1216    Order Status: Completed Specimen: Nasopharyngeal Updated: 06/28/23 1310     Influenza A, NAA Not detected        Comment: Negative results do not preclude infection with influenza virus and should not be the sole basis of a patient treatment decision.        Influenza B, NAA Not detected       COVID-19, Rapid [[id number]]  (Abnormal) Collected: 06/28/23 1215    Order Status: Completed Specimen: Nasopharyngeal Updated: 06/28/23 1256     Source NASAL        SARS-CoV-2, Rapid Detected        Comment:    The specimen is POSITIVE for SARS-CoV-2, the novel coronavirus associated with COVID-19.     This test has been authorized by the FDA under an Emergency Use Authorization (EUA) for use by authorized laboratories.      Fact sheet for Healthcare Providers: FlickSafe.gl  Fact sheet for Patients: [place].at     Methodology: Isothermal Nucleic Acid Amplification         Influenza A/B, Molecular [[id number]] Collected: 06/20/23 1115    Order Status: Completed Specimen:  Nasopharyngeal Updated: 06/20/23 1139     Influenza A, NAA Not detected        Comment: Negative results do not preclude infection  with influenza virus and should not be the sole basis of a patient treatment decision.        Influenza B, NAA Not detected               All Labs from Last 24 Hrs:  Recent Results (from the past 24 hour(s))   CBC with Auto Differential    Collection Time: 06/30/23  4:06 AM   Result Value Ref Range    WBC 2.1 (L) 4.3 - 11.1 K/uL    RBC 3.26 (L) 4.23 - 5.6 M/uL    Hemoglobin 9.5 (L) 13.6 - 17.2 g/dL    Hematocrit 16.1 (L) 41.1 - 50.3 %    MCV 82.5 82 - 102 FL    MCH 29.1 26.1 - 32.9 PG    MCHC 35.3 (H) 31.4 - 35.0 g/dL    RDW 09.6 04.5 - 40.9 %    Platelets 99 (L) 150 - 450 K/uL    MPV 13.2 (H) 9.4 - 12.3 FL    nRBC 0.00 0.0 - 0.2 K/uL    Neutrophils % 60.2 43.0 - 78.0 %    Lymphocytes % 19.2 13.0 - 44.0 %    Monocytes % 13.1 (H) 4.0 - 12.0 %    Eosinophils % 0.0 (L) 0.5 - 7.8 %    Basophils % 0.5 0.0 - 2.0 %    Immature Granulocytes % 7.0 (H) 0.0 - 5.0 %    Neutrophils Absolute 1.26 (L) 1.70 - 8.20 K/UL    Lymphocytes Absolute 0.40 (L) 0.50 - 4.60 K/UL    Monocytes Absolute 0.28 0.10 - 1.30 K/UL    Eosinophils Absolute 0.00 0.00 - 0.80 K/UL    Basophils Absolute 0.01 0.00 - 0.20 K/UL    Immature Granulocytes Absolute 0.15 0.0 - 0.5 K/UL    RBC Comment MODERATE  ANISOCYTOSIS + POIKILOCYTOSIS        WBC Comment Result Confirmed By Smear      Platelet Comment SLIGHT      Differential Type AUTOMATED     Basic Metabolic Panel w/ Reflex to MG    Collection Time: 06/30/23  4:06 AM   Result Value Ref Range    Sodium 137 136 - 145 mmol/L    Potassium 4.5 3.5 - 5.1 mmol/L    Chloride 107 98 - 107 mmol/L    CO2 17 (L) 20 - 29 mmol/L    Anion Gap 13 7 - 16 mmol/L    Glucose 191 (H) 70 - 99 mg/dL    BUN 45 (H) 8 - 23 MG/DL    Creatinine 8.11 (H) 0.80 - 1.30 MG/DL    Est, Glom Filt Rate 44 (L) >60 ml/min/1.12m2    Calcium 8.6 (L) 8.8 - 10.2 MG/DL   Phosphorus    Collection Time: 06/30/23  4:06 AM   Result Value Ref Range    Phosphorus 3.1 2.5 - 4.5 MG/DL       Recent Labs     91/47/82  1215   COVID19 Detected*       Recent Vital  Data:  Patient Vitals for the past 24 hrs:   Temp Pulse Resp BP SpO2   06/30/23 1143 97.9 F (36.6 C) 63 18 136/72 97 %   06/30/23 0822 97.3 F (36.3 C) 64 18 127/68 96 %   06/30/23  0756 -- 62 16 -- 95 %   06/30/23 0424 97.5 F (36.4 C) 60 17 (!) 155/70 97 %   06/30/23 0100 97.5 F (36.4 C) 58 17 136/69 95 %   06/29/23 2021 97.7 F (36.5 C) 65 17 125/64 95 %   06/29/23 1630 97.5 F (36.4 C) 65 16 132/75 95 %       Oxygen Therapy  SpO2: 97 %  Pulse via Oximetry: 108 beats per minute  Pulse Oximeter Device Mode: Intermittent  O2 Device: None (Room air)  O2 Flow Rate (L/min): 2 L/min    Estimated body mass index is 29.28 kg/m as calculated from the following:    Height as of this encounter: 1.676 m (5' 5.98").    Weight as of this encounter: 82.2 kg (181 lb 4.8 oz).    Intake/Output Summary (Last 24 hours) at 06/30/2023 1448  Last data filed at 06/30/2023 0440  Gross per 24 hour   Intake 420 ml   Output 250 ml   Net 170 ml         Physical Exam:  General:    Well nourished.  No overt distress  Head:  Normocephalic, atraumatic  Eyes:  Sclerae appear normal.  Pupils equally round.    HENT:  Nares appear normal, no drainage.  Moist mucous membranes  Neck:  No restricted ROM.  Trachea midline  CV:   RRR.  No m/r/g.  No JVD  Lungs:   CTAB.  No wheezing, rhonchi, or rales.  Respirations even, unlabored  Abdomen:   Soft, nontender, nondistended.    Extremities: Warm and dry.   No edema.    Skin:     No rashes.  Normal coloration  Neuro:  CN II-XII grossly intact.  Psych:  Normal mood and affect.        Time spent in patient discharge and coordination 39 minutes.      Signed:  Verdis Prime, MD    Part of this note may have been written by using a voice dictation software.  The note has been proof read but may still contain some grammatical/other typographical errors.

## 2023-06-30 NOTE — Progress Notes (Signed)
ACUTE OCCUPATIONAL THERAPY GOALS:   (Developed with and agreed upon by patient and/or caregiver.)  1. Pt will toilet with SBA   2. Pt will complete functional mobility for ADLs with SBA using AD as neede  3. Pt will complete lower body dressing with SBA using AE as needed  4. Pt will complete grooming and hygiene at sink with SBA  5. Pt will tolerate 23 minutes functional activity with min or fewer rest breaks to promote increased endurance for ADLs      Timeframe: 7 visits      OCCUPATIONAL THERAPY Initial Assessment and Daily Note       OT Visit Days: 1  Acknowledge Orders  Time  OT Charge Capture  Rehab Caseload Tracker      Paul Medina is a 89 y.o. male   PRIMARY DIAGNOSIS: Atrial fibrillation with rapid ventricular response (HCC)  Dehydration [E86.0]  New onset atrial fibrillation (HCC) [I48.91]  AKI (acute kidney injury) (HCC) [N17.9]  Atrial fibrillation with rapid ventricular response (HCC) [I48.91]  COVID-19 virus infection [U07.1]       Reason for Referral: Generalized Muscle Weakness (M62.81)  History of falling (Z91.81)  Inpatient: Payor: [facility] [employer] / Plan: [facility] [employer] / Product Type: *No Product type* /     ASSESSMENT:     REHAB RECOMMENDATIONS:   Recommendation to date pending progress:  Setting:  Short-term Rehab    Equipment:    None     ASSESSMENT:  Paul Medina was admitted with covid, fatigue, weakness resulting in a fall. Pt reports being too weak to make it into the shower. Pt presented with generalized weakness and with deficits in activity tolerance, mobility, and balance impacting ADLs. Pt CGA-min A overall using rolling walker. Pt unsteady with several mild LOB requiring assistance to correct. Pt fatigued quickly with activity. Pt presented below his independent baseline and would benefit from skilled OT services to address deficits.      [employer] AM-PACT "6 Clicks" Daily Activity Inpatient Short Form:    AM-PAC Daily Activity - Inpatient   How much help is needed for  putting on and taking off regular lower body clothing?: A Little  How much help is needed for bathing (which includes washing, rinsing, drying)?: A Lot  How much help is needed for toileting (which includes using toilet, bedpan, or urinal)?: A Little  How much help is needed for putting on and taking off regular upper body clothing?: A Little  How much help is needed for taking care of personal grooming?: None  How much help for eating meals?: None  AM-PAC Inpatient Daily Activity Raw Score: 19  AM-PAC Inpatient ADL T-Scale Score : 40.22  ADL Inpatient CMS 0-100% Score: 42.8  ADL Inpatient CMS G-Code Modifier : CK           SUBJECTIVE:     Paul Medina states, "I couldn't make it into the shower"     Social/Functional Lives With: Alone  Type of Home: Senior housing apartment  Home Layout: One level  Home Access: Level entry, Occupational psychologist Shower/Tub: Pension scheme manager: Midwife: Grab bars in shower, Grab bars around toilet  Bathroom Accessibility: Accessible  Home Equipment: Gilmer Mor, Engineer, production Help From: Family  Prior Level of Assist for ADLs: Independent  Prior Level of Assist for Homemaking: Independent  Prior Level of Assist for Ambulation: Independent household ambulator, with or without device  Prior Level of Assist for Transfers: Independent  Occupation:  Retired    OBJECTIVE:     LINES / DRAINS / AIRWAY: Telemetry     RESTRICTIONS/PRECAUTIONS:       PAIN: VITALS / O2:   Pre Treatment:    0      Post Treatment: 0       Vitals          Oxygen   Room air          GROSS EVALUATION: INTACT IMPAIRED   (See Comments)   UE AROM [x]  []    UE PROM []  []    Strength []   Generally decreased      Posture / Balance []   Min A    Sensation []      Coordination []        Tone []        Edema []     Activity Tolerance []   Impaired      Hand Dominance R []  L []       COGNITION/  PERCEPTION: INTACT IMPAIRED   (See Comments)   Orientation [x]      Vision []      Hearing []      Cognition  []    Impaired memory   Perception []        MOBILITY: I Mod I S SBA CGA Min Mod Max Total  NT x2 Comments:   Bed Mobility    Rolling []  []  []  []  []  []  []  []  []  []  []     Supine to Sit []  []  []  []  [x]  []  []  []  []  []  []     Scooting []  []  []  []  [x]  []  []  []  []  []  []     Sit to Supine []  []  []  []  []  []  []  []  []  []  []     Transfers    Sit to Stand []  []  []  []  []  [x]  []  []  []  []  []     Bed to Chair []  []  []  []  []  [x]  []  []  []  []  []     Stand to Sit []  []  []  []  []  [x]  []  []  []  []  []     Tub/Shower []  []  []  []  []  []  []  []  []  []  []      Toilet []  []  []  []  []  []  []  []  []  []  []       []  []  []  []  []  []  []  []  []  []  []     I=Independent, Mod I=Modified Independent, S=Supervision/Setup, SBA=Standby Assistance, CGA=Contact Guard Assistance, Min=Minimal Assistance, Mod=Moderate Assistance, Max=Maximal Assistance, Total=Total Assistance, NT=Not Tested    ACTIVITIES OF DAILY LIVING: I Mod I S SBA CGA Min Mod Max Total NT Comments   BASIC ADLs:              Upper Body Bathing  []  []  []  []  []  []  []  []  []  []      Lower Body Bathing []  []  []  []  []  []  []  []  []  []      Toileting []  []  []  []  []  []  []  []  []  []     Upper Body Dressing []  []  []  []  []  []  []  []  []  []     Lower Body Dressing []  []  []  []  []  [x]  []  []  []  []     Feeding []  []  []  []  []  []  []  []  []  []     Grooming []  []  []  []  [x]  []  []  []  []  []     Personal Device Care []  []  []  []  []  []  []  []  []  []     Functional Mobility []  []  []  []  [x]  [x]  []  []  []  []     I=Independent, Mod I=Modified Independent,  S=Supervision/Setup, SBA=Standby Assistance, CGA=Contact Guard Assistance, Min=Minimal Assistance, Mod=Moderate Assistance, Max=Maximal Assistance, Total=Total Assistance, NT=Not Tested    PLAN:   FREQUENCY/DURATION   OT Plan of Care: 3 times/week for duration of hospital stay or until stated goals are met, whichever comes first.    PROBLEM LIST:   (Skilled intervention is medically necessary to address:)  Decreased ADL/Functional Activities  Decreased Activity Tolerance  Decreased Balance  Decreased Safety  Awareness  Decreased Strength  Decreased Transfer Abilities   INTERVENTIONS PLANNED:  (Benefits and precautions of occupational therapy have been discussed with the patient.)  Self Care Training  Therapeutic Activity  Therapeutic Exercise/HEP  Neuromuscular Re-education  Manual Therapy  Education         TREATMENT:     EVALUATION: MODERATE COMPLEXITY: (Untimed Charge)  The initial evaluation charge encompasses clinical chart review, objective assessment, interpretation of assessment, and skilled monitoring of the patient's response to treatment in order to develop a plan of care.     TREATMENT:   Co-Treatment between OT and PT necessary due to patient's decreased overall endurance/tolerance levels, as well as need for high level skilled assistance to complete functional transfers/mobility and functional tasks  Self Care (23 minutes): Patient participated in lower body dressing, grooming, functional mobility, bed mobility, functional transfer, bathroom mobility, and assistive device in unsupported sitting and standing with minimal verbal cueing to increase independence, decrease assistance required, increase activity tolerance, and increase safety awareness. The patient was educated on role of occupational therapy, proper use of assistive device, and energy conservation techniques during ADL/IADLS and patient will need reinforcement at next session.     TREATMENT GRID:  N/A    AFTER TREATMENT PRECAUTIONS: Alarm Activated, Call light within reach, Chair, Needs within reach, and RN notified    INTERDISCIPLINARY COLLABORATION:  RN/ PCT and PT/ PTA    EDUCATION:  Education Given To: Patient  Education Provided: Role of Therapy;Plan of Care    TOTAL TREATMENT DURATION AND TIME:  Time In: 0944  Time Out: 1012  Minutes: [address]    Gordy Councilman, OT

## 2023-06-30 NOTE — Progress Notes (Signed)
GOALS:  LTG: Patient will maintain adequate hydration/nutrition with optimum safety and efficiency of swallowing function with PO intake without overt signs or symptoms of aspiration for the highest appropriate diet level.  STG:  Patient will consume minced and moist textures and thin liquids without overt signs or symptoms of airway compromise.  Patient will perform alternate liquids/solids, remain upright for 20-30 min after any PO, and slow rate of PO intake to improve swallow safety with no cues 90% of the time.  Patient will safely ingest diet trials during therapeutic feedings with SLP without overt signs or symptoms of respiratory compromise in efforts to advance diet.    SPEECH LANGUAGE PATHOLOGY: DYSPHAGIA Initial Assessment    Acknowledge Order  I  Therapy Time  I   Charges     I  Rehab Caseload Tracker      NAME: Paul Medina  DOB: 07/28/1930  MRN: [id number]    ADMISSION DATE: 06/28/2023  PRIMARY DIAGNOSIS: Atrial fibrillation with rapid ventricular response (HCC)    ICD-10: Treatment Diagnosis: R13.19 Other Dysphagia    RECOMMENDATIONS   Diet:    Minced and Moist  Thin Liquids    Medication: as tolerated    *Patient ok to upgrade to soft and bite sized upon request.*   Compensatory Swallowing Strategies:   Alternate solids and liquids  Slow rate of intake  Small bites/sips  Upright as possible for all oral intake   Therapeutic Intervention:   Patient/family education  Dysphagia treatment   Patient continues to require skilled intervention:  Yes. Recommend ongoing speech therapy services during this hospitalization.     Anticipated Discharge Needs: Do not anticipate ongoing speech therapy needs upon discharge.      ASSESSMENT    Patient presents with functional oral and pharyngeal swallow as able to be determined at bedside characterized by efficient oral prep and transfer, timely swallow initiation, and no overt indications of airway compromise when consuming limited trials of thin, puree, and mixed  consistency fruit cup due to patient limitations with solid consistencies due to esophageal dysphagia. Patient highly aware of limitations, requesting to continue with minced and moist solids at this time. Patient tolerated mixed consistency fruit cup without difficulty due to moist presentation. Ok to upgrade to soft and bite sized upon patient request.     Recommend minced and moist solids, thin liquids, medications as tolerated.     GENERAL    Subjective: Lying in bed, agreeable to skilled assessment.     Reason for Consult:  esophageal dysphagia.     History of Present Injury/Illness: Paul Medina  has a past medical history of Arthritis, Asthma, Chronic bronchitis (HCC), Hypertension, and Lung cancer (HCC).. He also  has no past surgical history on file.  Precautions/Allergies: Ciprofloxacin, Doxycycline, Lisinopril, Other, Sulfa antibiotics, Sulfasalazine, and Sulfur     Observations:  Alertness: Alert  Voice: WFL  Speech: Intelligible  Expressive Language: Fluent and Able to communicate wants and needs  Receptive Language: Answers yes/no questions and Follows basic commands  Cognition: Able to recall recent events and medical history and Appropriately attends to clinician    Prior Dysphagia History: patient knows limitations due to esophageal dysphagia  Prior Instrumental Assessment: none    Current Diet:  Minced and Moist  Thin Liquids    Respiratory Status: Nasal Cannula    Pain:  Patient does not c/o pain  Pain intervention: None- No pain observed  Pain response: Patient satisfied    OBJECTIVE    Oral Motor Assessment:  Labial: no impairment  Lingual: no impairment  Dentition: natural  Oral Hygiene: adequate  Vocal quality: WFL  Volitional cough: present and adequate  Volitional swallow: present and adequate  Oropharyngeal Assessment: Patient presented with trials of thin liquids, pureed solids, and mixed consistency fruit cup with limited trials due to patient requesting lunch meal- kitchen staff made  aware of lunch tray not being delivered.  Appropriate oral prep with all textures. Timely swallow initiation, and single swallows upon palpation. Adequate oral clearing. No overt signs or symptoms of airway compromise observed with liquid or solid textures.   Patient requesting to continue with minced and moist solids, thin liquids. Patient ok to upgrade to soft and bite sized diet upon request. RN made aware. Communication board updated.     Dysphagia Outcome and Severity Scale (DOSS)  Score: 5 Description   []  7 Normal in all situations   []  6 Within Functional Limits/ Modified independent   [x]  5 Mild Dysphagia: Distant Supervision. May need one diet consistency      restricted.   []  4 Mild-Moderate Dysphagia: Intermittent supervision/cuing. One-two diet    consistencies restricted.   []  3 Moderate Dysphagia: Total assistance, supervision, or strategies.       Two or more diet consistencies restricted.   []  2 Moderate-Severe Dysphagia: Maximum assistance or maximum use     of strategies with partial po intake   []  1 Severe dysphagia- NPO. Unable to tolerate any po safely     PLAN    Duration/Frequency: Continue to follow patient 2x/week for duration of hospitalization and/or until goals met    Rehabilitation Potential For Stated Goals: Good    Interdisciplinary Collaboration: RN/ PCT    Medical Necessity    Skilled intervention continues to be required due to medical complications.    Education:   Patient and/or family/caregiver educated on Results of evaluation, Role of speech therapy, Diet recommendations, SLP recommendations, and SLP plan  Education response: Verbalizes understanding and Demonstrates understanding    Safety:   Call light within reach  In Bed  RN notified     Therapy Time:  Time In: 1215  Time Out: 1237  Minutes: 22    Denver Faster, M.S., CCC-SLP   06/30/2023 12:41 PM

## 2023-06-30 NOTE — Care Coordination-Inpatient (Addendum)
CM following. PT/OT consulted and recommended STR. Discussed with pt and politely declined. He stated that he was pretty indep PTA and that the [facility] offers yoga and exercise classes every morning. Reported that he got COVID from his dtr. Pt was agreeable to [facility], referral sent to [facility] (in contract with the [place]) for RN/PT/OT. Updated d/c plan with Sanda Klein at the [facility] and faxed clinicals and [facility] order to: [phone].    1538-Discharge order is in. Pt is upset and refusing to leave, stated that he was told that he has to stay until next week for appointments. Messaged MD to follow up with pt. [facility] arranged. May need transportation back to the facility since his family is sick with COVID as well.    1632-Pt now reports that he was never given the option of STR and now wants to go to rehab. [facility] and [facility]. Will need updated STR List from the [place] to provide to the pt. Will also need to confirm COVID protocol with the facilities; # of days pt has been w/o sx.

## 2023-07-01 LAB — CBC WITH AUTO DIFFERENTIAL
Basophils %: 0.6 % (ref 0.0–2.0)
Basophils Absolute: 0.02 10*3/uL (ref 0.00–0.20)
Eosinophils %: 0 % — ABNORMAL LOW (ref 0.5–7.8)
Eosinophils Absolute: 0 10*3/uL (ref 0.00–0.80)
Hematocrit: 29.4 % — ABNORMAL LOW (ref 41.1–50.3)
Hemoglobin: 10.2 g/dL — ABNORMAL LOW (ref 13.6–17.2)
Immature Granulocytes %: 7.2 % — ABNORMAL HIGH (ref 0.0–5.0)
Immature Granulocytes Absolute: 0.23 10*3/uL (ref 0.0–0.5)
Lymphocytes %: 14.5 % (ref 13.0–44.0)
Lymphocytes Absolute: 0.46 10*3/uL — ABNORMAL LOW (ref 0.50–4.60)
MCH: 28.7 pg (ref 26.1–32.9)
MCHC: 34.7 g/dL (ref 31.4–35.0)
MCV: 82.8 fL (ref 82–102)
MPV: 13.7 fL — ABNORMAL HIGH (ref 9.4–12.3)
Monocytes %: 17 % — ABNORMAL HIGH (ref 4.0–12.0)
Monocytes Absolute: 0.54 10*3/uL (ref 0.10–1.30)
Neutrophils %: 60.7 % (ref 43.0–78.0)
Neutrophils Absolute: 1.94 10*3/uL (ref 1.70–8.20)
Platelet Comment: ADEQUATE
Platelets: 107 10*3/uL — ABNORMAL LOW (ref 150–450)
RBC: 3.55 M/uL — ABNORMAL LOW (ref 4.23–5.6)
RDW: 14.6 % (ref 11.9–14.6)
WBC: 3.2 10*3/uL — ABNORMAL LOW (ref 4.3–11.1)
nRBC: 0 10*3/uL (ref 0.0–0.2)

## 2023-07-01 LAB — BASIC METABOLIC PANEL W/ REFLEX TO MG FOR LOW K
Anion Gap: 12 mmol/L (ref 7–16)
BUN: 44 mg/dL — ABNORMAL HIGH (ref 8–23)
CO2: 20 mmol/L (ref 20–29)
Calcium: 9 mg/dL (ref 8.8–10.2)
Chloride: 106 mmol/L (ref 98–107)
Creatinine: 1.46 mg/dL — ABNORMAL HIGH (ref 0.80–1.30)
Est, Glom Filt Rate: 45 mL/min/{1.73_m2} — ABNORMAL LOW (ref >60)
Glucose: 152 mg/dL — ABNORMAL HIGH (ref 70–99)
Potassium: 4.6 mmol/L (ref 3.5–5.1)
Sodium: 138 mmol/L (ref 136–145)

## 2023-07-01 LAB — PHOSPHORUS: Phosphorus: 3.4 mg/dL (ref 2.5–4.5)

## 2023-07-01 MED ORDER — SPIRONOLACTONE 25 MG PO TABS
25 | Freq: Every day | ORAL | Status: DC
Start: 2023-07-01 — End: 2023-07-02
  Administered 2023-07-01 – 2023-07-02 (×2): 12.5 mg via ORAL

## 2023-07-01 MED FILL — ROSUVASTATIN CALCIUM 10 MG PO TABS: 10 MG | ORAL | Qty: 1

## 2023-07-01 MED FILL — PREDNISONE 20 MG PO TABS: 20 MG | ORAL | Qty: 2

## 2023-07-01 MED FILL — ARFORMOTEROL TARTRATE 15 MCG/2ML IN NEBU: 152 MCG/2ML | RESPIRATORY_TRACT | Qty: 2

## 2023-07-01 MED FILL — FUROSEMIDE 20 MG PO TABS: 20 MG | ORAL | Qty: 1

## 2023-07-01 MED FILL — DOXYCYCLINE HYCLATE 100 MG PO CAPS: 100 MG | ORAL | Qty: 1

## 2023-07-01 MED FILL — CETIRIZINE HCL 10 MG PO TABS: 10 MG | ORAL | Qty: 1

## 2023-07-01 MED FILL — JARDIANCE 10 MG PO TABS: 10 MG | ORAL | Qty: 1

## 2023-07-01 MED FILL — CULTURELLE PO CAPS: ORAL | Qty: 1

## 2023-07-01 MED FILL — ELIQUIS 2.5 MG PO TABS: 2.5 MG | ORAL | Qty: 1

## 2023-07-01 MED FILL — MUCUS RELIEF ER 600 MG PO TB12: 600 MG | ORAL | Qty: 2

## 2023-07-01 MED FILL — IPRATROPIUM BROMIDE 0.02 % IN SOLN: 0.02 % | RESPIRATORY_TRACT | Qty: 2.5

## 2023-07-01 MED FILL — SPIRONOLACTONE 25 MG PO TABS: 25 MG | ORAL | Qty: 1

## 2023-07-01 MED FILL — CEFTRIAXONE SODIUM 1 G IJ SOLR: 1 g | INTRAMUSCULAR | Qty: 1000

## 2023-07-01 MED FILL — CARVEDILOL 6.25 MG PO TABS: 6.25 MG | ORAL | Qty: 1

## 2023-07-01 MED FILL — SODIUM BICARBONATE 650 MG PO TABS: 650 MG | ORAL | Qty: 1

## 2023-07-01 MED FILL — TAMSULOSIN HCL 0.4 MG PO CAPS: 0.4 MG | ORAL | Qty: 1

## 2023-07-01 NOTE — Progress Notes (Signed)
[facility] PROGRESS NOTE           07/01/2023 9:12 AM    Admit Date: 06/28/2023      Subjective:     In sinus rhythm.  On room air.  States improved symptoms compared to presentation.  Denies any chest discomfort.    ROS:  Cardiovascular:  As noted above    Objective:      Vitals:    07/01/23 0040 07/01/23 0425 07/01/23 0701 07/01/23 0813   BP: (!) 150/73 139/66  115/81   Pulse: 67 76 79 76   Resp: 18 18 18 20    Temp: 97.3 F (36.3 C) 97.9 F (36.6 C)  97.5 F (36.4 C)   TempSrc: Oral Oral  Oral   SpO2: 97% 94% 94% 94%   Weight:       Height:           Physical Exam:  General-No Acute Distress  Neck- supple, no JVD  CV- regular rate and rhythm no MRG  Lung- clear bilaterally  Abd- soft, nontender, nondistended  Ext- no edema bilaterally.  Skin- warm and dry    Data Review:   Recent Labs     06/29/23  1015 06/30/23  0406 07/01/23  0407   NA  --  137 138   K  --  4.5 4.6   BUN  --  45* 44*   WBC  --  2.1* 3.2*   HGB  --  9.5* 10.2*   HCT  --  26.9* 29.4*   PLT  --  99* 107*   INR 1.2  --   --        Assessment/Plan:     Principal Problem:    Atrial fibrillation with rapid ventricular response (HCC)  -Currently in sinus rhythm; likely triggered by acute underlying issues.  -Has been initiated on Eliquis; defer long-term anticoagulation to primary cardiologist in the outpatient setting.  No prior history of AF.  -Closely monitor Eliquis dosing based on renal function.  Creatinine currently borderline and on low-dose Eliquis.  Reassess prior to discharge; if creatinine less than 1.5 on discharge, will need 5 mg twice daily..    Active Problems:    Chronic heart failure with mildly reduced ejection fraction (HFmrEF, 41-49%) (HCC)  -Stated mild left ventricular dysfunction from [facility] from echo from 05/2020; prior remote PCI to the LAD.  -Continue Coreg/losartan; exam currently euvolemic.  Echocardiogram with moderate left ventricular dysfunction from current stay.  Added on SGLT2 inhibitor.  Initiate  low-dose MRA.       HTN (hypertension)  -Target less than 130/80.      HLD (hyperlipidemia)  -Continue current Crestor      COVID    Moderate asthma with exacerbation  -Per primary team       Lenna Gilford, MD  07/01/2023 9:12 AM

## 2023-07-01 NOTE — Progress Notes (Signed)
Hospitalist Progress Note   Admit Date:  06/28/2023 10:56 AM   Name:  Paul Medina   Age:  89 y.o.  Sex:  male  DOB:  1931/05/16   MRN:  [id number]   Room:  416/01    Presenting/Chief Complaint: Cold Symptoms and Dehydration     Reason(s) for Admission: Dehydration [E86.0]  New onset atrial fibrillation (HCC) [I48.91]  AKI (acute kidney injury) (HCC) [N17.9]  Atrial fibrillation with rapid ventricular response (HCC) [I48.91]  COVID-19 virus infection [U07.1]     Hospital Course:   Paul Medina is a 89 y.o. male with medical history of asthma, hypertension, sleep apnea, TIA, acid reflux, blind in his left eye, history of B-cell lymphoma.  CKD 3.       Patient says he has been feeling weaker, tired, mild dry cough going on for several weeks, recently finished a course of Zithromax about 2 weeks ago.  He also had a fall about 2 weeks ago secondary generalized weakness.  As the symptoms persisted decided to come to emergency room for further evaluation.     Apart from generalized weakness fatigue and dry cough with mild wheezing patient otherwise does not complain of any headache or dizziness or palpitation or chest pain or any nausea vomiting or abdominal pain.     WBC of 3.7, platelet of 105, hemoglobin of 10.7, bicarb of 17 creatinine 1.66, previous creatinine of 1.42, proBNP of 2539, initial troponin 64, repeat troponin of 68  EKG new A-fib with RVR, with PVCs, rate of 118  Chest x-ray-  Questionable ill-defined opacity of the retrocardiac region  without other acute cardiopulmonary finding. Dedicated PA/lateral chest would be  useful for further evaluation  Positive for COVID  For his new A-fib with RVR patient received Cardizem 10 mg IV x 1 and presently on Cardizem drip.     Patient will be admitted for asthma exasperation, pneumonia, new A-fib with RVR, mild acute on chronic kidney disease.      Subjective & 24hr Events:   Patient was seen and examined at the bedside. He is very hard of hearing.he was  feeling much better.. Patient denies fever, chills, nausea, vomiting, diarrhea.      Assessment & Plan:   Atrial fibrillation with rapid ventricular response (HCC)  New A-fib with RVR,  Off Cardizem drip  Rate controlled with coreg  On Eliquis renally dosed  Defer long-term anticoagulation to primary cardiologist in the outpatient setting.  Cardiology on board, appreciate recommendations    Pneumonia/asthma exacerbation/covid  Continue Rocephin 1 g daily, doxycycline 100 mg twice daily,   Transitioned to prednisone, albuterol nebs Q4 hourly, Pulmicort nebs twice daily    Chronic CHFrEF  (41-49%)   Euvolemic  Added Jardiance and Aldactone     Acute on chronic kidney disease  Creatinine back to baseline, previous creatinine of 1.42  Held losartan     History of TIA  Aspirin 81 mg daily     History of B-cell lymphoma  Follow-up with his hematologist Dr. Rulon Abide at [facility] as outpatient  Oncology signed off, appreciate recommendations     Obstructive sleep apnea  Respiratory consult for CPAP machine     Coronary disease-patient says he was told he had cardiomegaly, never had any stent  On Coreg 3.125 mg p.o. twice daily     Hypertension  Holding off on antihypertensives as patient is normotensive    Hyperlipidemia  Crestor    Dysphagia  SLP evaluated and started on diet  GI  signed off, appreciate recommendations    PT/OT recommended STR. Most likely next week as it is [place].     Dispo anticipate hospital stay for 1 to 2 days.     PT/OT evals ordered?  Therapy evals ordered  Diet: ADULT DIET; Regular; Low Fat/Low Chol/High Fiber/2 gm Na  VTE prophylaxis: SCD's   Code status: Full Code  Code status discussed: Yes  Blood consent obtained: N/A      Non-peripheral Lines and Tubes (if present):          Telemetry (if present):  Cardiac/Telemetry Monitor On: Portable telemetry pack applied        Hospital Problems:  Principal Problem:    Atrial fibrillation with rapid ventricular response (HCC)  Active Problems:    Chronic heart  failure with mildly reduced ejection fraction (HFmrEF, 41-49%) (HCC)    HTN (hypertension)    HLD (hyperlipidemia)    Coronary atherosclerosis of native coronary artery    TIA (transient ischemic attack)    Amaurosis fugax of left eye    OSA (obstructive sleep apnea)    Stage 3 chronic kidney disease (HCC)    COVID    Moderate asthma with exacerbation    COVID-19 virus infection    Pancytopenia (HCC)  Resolved Problems:    * No resolved hospital problems. *      Objective:   Patient Vitals for the past 24 hrs:   Temp Pulse Resp BP SpO2   07/01/23 1134 98.1 F (36.7 C) 70 18 123/74 93 %   07/01/23 0813 97.5 F (36.4 C) 76 20 115/81 94 %   07/01/23 0701 -- 79 18 -- 94 %   07/01/23 0425 97.9 F (36.6 C) 76 18 139/66 94 %   07/01/23 0040 97.3 F (36.3 C) 67 18 (!) 150/73 97 %   06/30/23 2043 97.9 F (36.6 C) 64 18 120/70 93 %   06/30/23 1618 97.9 F (36.6 C) 71 18 (!) 147/81 98 %   06/30/23 1503 -- 63 18 -- 95 %       Oxygen Therapy  SpO2: 93 %  Pulse via Oximetry: 108 beats per minute  Pulse Oximeter Device Mode: Intermittent  O2 Device: None (Room air)  O2 Flow Rate (L/min): 2 L/min    Estimated body mass index is 29.28 kg/m as calculated from the following:    Height as of this encounter: 1.676 m (5' 5.98").    Weight as of this encounter: 82.2 kg (181 lb 4.8 oz).    Intake/Output Summary (Last 24 hours) at 07/01/2023 1236  Last data filed at 07/01/2023 0255  Gross per 24 hour   Intake 200 ml   Output 400 ml   Net -200 ml         Physical Exam:     General:    Well nourished.  Ill-appearing  Head:  Normocephalic, atraumatic  Eyes:  Sclerae appear normal.  Pupils equally round.  ENT:  Nares appear normal.  Moist oral mucosa  Neck:  No restricted ROM.  Trachea midline   CV:   RRR.  No m/r/g.  No jugular venous distension.  Irregular heart beat  Lungs:   CTAB.  No  rhonchi, or rales.  Symmetric expansion.  Bilateral wheezing is present  Abdomen:   Soft, nontender, nondistended.  Extremities: No cyanosis or clubbing.   No edema  Skin:     No rashes.  Normal coloration.   Warm and dry.    Neuro:  CN  II-XII grossly intact.    Psych:  Normal mood and affect.      I have personally reviewed labs and tests:  Recent Labs:  Recent Results (from the past 48 hour(s))   CBC with Auto Differential    Collection Time: 06/30/23  4:06 AM   Result Value Ref Range    WBC 2.1 (L) 4.3 - 11.1 K/uL    RBC 3.26 (L) 4.23 - 5.6 M/uL    Hemoglobin 9.5 (L) 13.6 - 17.2 g/dL    Hematocrit 10.2 (L) 41.1 - 50.3 %    MCV 82.5 82 - 102 FL    MCH 29.1 26.1 - 32.9 PG    MCHC 35.3 (H) 31.4 - 35.0 g/dL    RDW 72.5 36.6 - 44.0 %    Platelets 99 (L) 150 - 450 K/uL    MPV 13.2 (H) 9.4 - 12.3 FL    nRBC 0.00 0.0 - 0.2 K/uL    Neutrophils % 60.2 43.0 - 78.0 %    Lymphocytes % 19.2 13.0 - 44.0 %    Monocytes % 13.1 (H) 4.0 - 12.0 %    Eosinophils % 0.0 (L) 0.5 - 7.8 %    Basophils % 0.5 0.0 - 2.0 %    Immature Granulocytes % 7.0 (H) 0.0 - 5.0 %    Neutrophils Absolute 1.26 (L) 1.70 - 8.20 K/UL    Lymphocytes Absolute 0.40 (L) 0.50 - 4.60 K/UL    Monocytes Absolute 0.28 0.10 - 1.30 K/UL    Eosinophils Absolute 0.00 0.00 - 0.80 K/UL    Basophils Absolute 0.01 0.00 - 0.20 K/UL    Immature Granulocytes Absolute 0.15 0.0 - 0.5 K/UL    RBC Comment MODERATE  ANISOCYTOSIS + POIKILOCYTOSIS        WBC Comment Result Confirmed By Smear      Platelet Comment SLIGHT      Differential Type AUTOMATED     Basic Metabolic Panel w/ Reflex to MG    Collection Time: 06/30/23  4:06 AM   Result Value Ref Range    Sodium 137 136 - 145 mmol/L    Potassium 4.5 3.5 - 5.1 mmol/L    Chloride 107 98 - 107 mmol/L    CO2 17 (L) 20 - 29 mmol/L    Anion Gap 13 7 - 16 mmol/L    Glucose 191 (H) 70 - 99 mg/dL    BUN 45 (H) 8 - 23 MG/DL    Creatinine 3.47 (H) 0.80 - 1.30 MG/DL    Est, Glom Filt Rate 44 (L) >60 ml/min/1.2m2    Calcium 8.6 (L) 8.8 - 10.2 MG/DL   Phosphorus    Collection Time: 06/30/23  4:06 AM   Result Value Ref Range    Phosphorus 3.1 2.5 - 4.5 MG/DL   CBC with Auto Differential     Collection Time: 07/01/23  4:07 AM   Result Value Ref Range    WBC 3.2 (L) 4.3 - 11.1 K/uL    RBC 3.55 (L) 4.23 - 5.6 M/uL    Hemoglobin 10.2 (L) 13.6 - 17.2 g/dL    Hematocrit 42.5 (L) 41.1 - 50.3 %    MCV 82.8 82 - 102 FL    MCH 28.7 26.1 - 32.9 PG    MCHC 34.7 31.4 - 35.0 g/dL    RDW 95.6 38.7 - 56.4 %    Platelets 107 (L) 150 - 450 K/uL    MPV 13.7 (H) 9.4 - 12.3 FL  nRBC 0.00 0.0 - 0.2 K/uL    Neutrophils % 60.7 43.0 - 78.0 %    Lymphocytes % 14.5 13.0 - 44.0 %    Monocytes % 17.0 (H) 4.0 - 12.0 %    Eosinophils % 0.0 (L) 0.5 - 7.8 %    Basophils % 0.6 0.0 - 2.0 %    Immature Granulocytes % 7.2 (H) 0.0 - 5.0 %    Neutrophils Absolute 1.94 1.70 - 8.20 K/UL    Lymphocytes Absolute 0.46 (L) 0.50 - 4.60 K/UL    Monocytes Absolute 0.54 0.10 - 1.30 K/UL    Eosinophils Absolute 0.00 0.00 - 0.80 K/UL    Basophils Absolute 0.02 0.00 - 0.20 K/UL    Immature Granulocytes Absolute 0.23 0.0 - 0.5 K/UL    RBC Comment ANISOCYTOSIS      RBC Comment SLIGHT  ANISOCYTOSIS + POIKILOCYTOSIS        WBC Comment Result Confirmed By Smear      Platelet Comment ADEQUATE      Differential Type AUTOMATED     Basic Metabolic Panel w/ Reflex to MG    Collection Time: 07/01/23  4:07 AM   Result Value Ref Range    Sodium 138 136 - 145 mmol/L    Potassium 4.6 3.5 - 5.1 mmol/L    Chloride 106 98 - 107 mmol/L    CO2 20 20 - 29 mmol/L    Anion Gap 12 7 - 16 mmol/L    Glucose 152 (H) 70 - 99 mg/dL    BUN 44 (H) 8 - 23 MG/DL    Creatinine 2.37 (H) 0.80 - 1.30 MG/DL    Est, Glom Filt Rate 45 (L) >60 ml/min/1.83m2    Calcium 9.0 8.8 - 10.2 MG/DL   Phosphorus    Collection Time: 07/01/23  4:07 AM   Result Value Ref Range    Phosphorus 3.4 2.5 - 4.5 MG/DL       No results for input(s): "COVID19" in the last 72 hours.      Current Meds:  Current Facility-Administered Medications   Medication Dose Route Frequency    spironolactone (ALDACTONE) tablet 12.5 mg  12.5 mg Oral Daily    empagliflozin (JARDIANCE) tablet 10 mg  10 mg Oral Daily    saliva  substitute (BIOTENE/MOUTH KOTE) liquid   Oral PRN    ipratropium (ATROVENT) 0.02 % nebulizer solution 0.5 mg  0.5 mg Nebulization Q6H WA RT    predniSONE (DELTASONE) tablet 40 mg  40 mg Oral Daily    Benzocaine-Menthol (CEPACOL SORE THROAT) 15-2.6 MG lozenge 1 lozenge  1 lozenge Oral Q2H PRN    albuterol (PROVENTIL) (2.5 MG/3ML) 0.083% nebulizer solution 2.5 mg  2.5 mg Nebulization Q6H PRN    cetirizine (ZYRTEC) tablet 10 mg  10 mg Oral Daily    pantoprazole (PROTONIX) tablet 40 mg  40 mg Oral Daily    rosuvastatin (CRESTOR) tablet 30 mg  30 mg Oral Nightly    tamsulosin (FLOMAX) capsule 0.4 mg  0.4 mg Oral Daily    sodium chloride flush 0.9 % injection 5-40 mL  5-40 mL IntraVENous 2 times per day    sodium chloride flush 0.9 % injection 5-40 mL  5-40 mL IntraVENous PRN    0.9 % sodium chloride infusion   IntraVENous PRN    ondansetron (ZOFRAN-ODT) disintegrating tablet 4 mg  4 mg Oral Q8H PRN    Or    ondansetron (ZOFRAN) injection 4 mg  4 mg IntraVENous Q6H PRN  polyethylene glycol (GLYCOLAX) packet 17 g  17 g Oral Daily PRN    acetaminophen (TYLENOL) tablet 650 mg  650 mg Oral Q6H PRN    Or    acetaminophen (TYLENOL) suppository 650 mg  650 mg Rectal Q6H PRN    prednisoLONE acetate (PRED FORTE) 1 % ophthalmic suspension 1 drop  1 drop Left Eye Daily    atropine 1 % ophthalmic solution 1 drop  1 drop Left Eye Daily    brimonidine (ALPHAGAN) 0.2 % ophthalmic solution 1 drop  1 drop Left Eye TID    arformoterol tartrate (BROVANA) nebulizer solution 15 mcg  15 mcg Nebulization BID RT    cefTRIAXone (ROCEPHIN) 1,000 mg in sterile water 10 mL IV syringe  1,000 mg IntraVENous Q24H    doxycycline hyclate (VIBRAMYCIN) capsule 100 mg  100 mg Oral 2 times per day    guaiFENesin (MUCINEX) extended release tablet 1,200 mg  1,200 mg Oral BID    apixaban (ELIQUIS) tablet 2.5 mg  2.5 mg Oral BID    carvedilol (COREG) tablet 6.25 mg  6.25 mg Oral BID WC    furosemide (LASIX) tablet 20 mg  20 mg Oral Daily    lactobacillus  (CULTURELLE) capsule 1 capsule  1 capsule Oral Daily with breakfast       Signed:  Verdis Prime, MD    Part of this note may have been written by using a voice dictation software.  The note has been proof read but may still contain some grammatical/other typographical errors.

## 2023-07-02 MED ORDER — SPIRONOLACTONE 25 MG PO TABS
25 MG | Freq: Every day | ORAL | Status: AC
Start: 2023-07-02 — End: 2023-07-03

## 2023-07-02 MED FILL — ELIQUIS 2.5 MG PO TABS: 2.5 MG | ORAL | Qty: 1

## 2023-07-02 MED FILL — ROSUVASTATIN CALCIUM 10 MG PO TABS: 10 MG | ORAL | Qty: 1

## 2023-07-02 MED FILL — ARFORMOTEROL TARTRATE 15 MCG/2ML IN NEBU: 152 MCG/2ML | RESPIRATORY_TRACT | Qty: 2

## 2023-07-02 MED FILL — DOXYCYCLINE HYCLATE 100 MG PO CAPS: 100 MG | ORAL | Qty: 1

## 2023-07-02 MED FILL — TAMSULOSIN HCL 0.4 MG PO CAPS: 0.4 MG | ORAL | Qty: 1

## 2023-07-02 MED FILL — FUROSEMIDE 20 MG PO TABS: 20 MG | ORAL | Qty: 1

## 2023-07-02 MED FILL — MUCUS RELIEF ER 600 MG PO TB12: 600 MG | ORAL | Qty: 2

## 2023-07-02 MED FILL — CARVEDILOL 6.25 MG PO TABS: 6.25 MG | ORAL | Qty: 1

## 2023-07-02 MED FILL — CULTURELLE PO CAPS: ORAL | Qty: 1

## 2023-07-02 MED FILL — PREDNISONE 20 MG PO TABS: 20 MG | ORAL | Qty: 2

## 2023-07-02 MED FILL — IPRATROPIUM BROMIDE 0.02 % IN SOLN: 0.02 % | RESPIRATORY_TRACT | Qty: 2.5

## 2023-07-02 MED FILL — SPIRONOLACTONE 25 MG PO TABS: 25 MG | ORAL | Qty: 1

## 2023-07-02 MED FILL — CETIRIZINE HCL 10 MG PO TABS: 10 MG | ORAL | Qty: 1

## 2023-07-02 MED FILL — JARDIANCE 10 MG PO TABS: 10 MG | ORAL | Qty: 1

## 2023-07-02 MED FILL — CEFTRIAXONE SODIUM 1 G IJ SOLR: 1 g | INTRAMUSCULAR | Qty: 1000

## 2023-07-02 NOTE — Progress Notes (Signed)
[facility] PROGRESS NOTE           07/02/2023 9:12 AM    Admit Date: 06/28/2023      Subjective:     In sinus rhythm.  On room air.  States improved symptoms compared to presentation.  Denies any chest discomfort.    ROS:  Cardiovascular:  As noted above    Objective:      Vitals:    07/02/23 0027 07/02/23 0402 07/02/23 0808 07/02/23 0822   BP: 130/62 (!) 153/78  (!) 145/79   Pulse: 72 64  72   Resp: 18 18     Temp: 98.6 F (37 C) 97.7 F (36.5 C)     TempSrc: Oral Oral     SpO2: 96% 96% 94%    Weight:       Height:           Physical Exam:  General-No Acute Distress  Neck- supple, no JVD  CV- regular rate and rhythm no MRG  Lung- clear bilaterally  Abd- soft, nontender, nondistended  Ext- no edema bilaterally.  Skin- warm and dry    Data Review:   Recent Labs     06/30/23  0406 07/01/23  0407   NA 137 138   K 4.5 4.6   BUN 45* 44*   WBC 2.1* 3.2*   HGB 9.5* 10.2*   HCT 26.9* 29.4*   PLT 99* 107*       Assessment/Plan:     Principal Problem:    Atrial fibrillation with rapid ventricular response (HCC)  -Currently in sinus rhythm; likely triggered by acute underlying issues.  -Has been initiated on Eliquis; defer long-term anticoagulation to primary cardiologist in the outpatient setting.  No prior history of AF.  -Closely monitor Eliquis dosing based on renal function.  Creatinine currently borderline and on low-dose Eliquis.  Reassess prior to discharge; if creatinine less than 1.5 on discharge, will need 5 mg twice daily..    Active Problems:    Chronic heart failure with mildly reduced ejection fraction (HFmrEF, 41-49%) (HCC)  -Stated mild left ventricular dysfunction from [facility] from echo from 05/2020; prior remote PCI to the LAD.  -Continue Coreg/losartan; exam currently euvolemic.  Echocardiogram with moderate left ventricular dysfunction from current stay.  Added on SGLT2 inhibitor.  Initiated Aldactone and increase dosing to 25 mg daily.  -Consideration for ARNI as an outpatient per  primary cardiologist      HTN (hypertension)  -Target less than 130/80.      HLD (hyperlipidemia)  -Continue current Crestor      COVID    Moderate asthma with exacerbation  -Per primary team       Lenna Gilford, MD  07/02/2023 9:12 AM

## 2023-07-02 NOTE — Progress Notes (Signed)
Hospitalist Progress Note   Admit Date:  06/28/2023 10:56 AM   Name:  Paul Medina   Age:  89 y.o.  Sex:  male  DOB:  03/04/1931   MRN:  [id number]   Room:  416/01    Presenting/Chief Complaint: Cold Symptoms and Dehydration     Reason(s) for Admission: Dehydration [E86.0]  New onset atrial fibrillation (HCC) [I48.91]  AKI (acute kidney injury) (HCC) [N17.9]  Atrial fibrillation with rapid ventricular response (HCC) [I48.91]  COVID-19 virus infection [U07.1]     Hospital Course:   Paul Medina is a 89 y.o. male with medical history of asthma, hypertension, sleep apnea, TIA, acid reflux, blind in his left eye, history of B-cell lymphoma.  CKD 3.       Patient says he has been feeling weaker, tired, mild dry cough going on for several weeks, recently finished a course of Zithromax about 2 weeks ago.  He also had a fall about 2 weeks ago secondary generalized weakness.  As the symptoms persisted decided to come to emergency room for further evaluation.     Apart from generalized weakness fatigue and dry cough with mild wheezing patient otherwise does not complain of any headache or dizziness or palpitation or chest pain or any nausea vomiting or abdominal pain.     WBC of 3.7, platelet of 105, hemoglobin of 10.7, bicarb of 17 creatinine 1.66, previous creatinine of 1.42, proBNP of 2539, initial troponin 64, repeat troponin of 68  EKG new A-fib with RVR, with PVCs, rate of 118  Chest x-ray-  Questionable ill-defined opacity of the retrocardiac region  without other acute cardiopulmonary finding. Dedicated PA/lateral chest would be  useful for further evaluation  Positive for COVID  For his new A-fib with RVR patient received Cardizem 10 mg IV x 1 and presently on Cardizem drip.     Patient will be admitted for asthma exasperation, pneumonia, new A-fib with RVR, mild acute on chronic kidney disease.      Subjective & 24hr Events:   Patient was seen and examined at the bedside. He is very hard of hearing.  Patient denies fever, chills, nausea, vomiting, diarrhea.      Assessment & Plan:   Atrial fibrillation with rapid ventricular response (HCC)  New A-fib with RVR,  Off Cardizem drip  Rate controlled with coreg  On Eliquis renally dosed  Increased aldactone to 25 mg daily  Defer long-term anticoagulation to primary cardiologist in the outpatient setting.  Cardiology on board, appreciate recommendations    Pneumonia/asthma exacerbation/covid  Continue Rocephin 1 g daily, doxycycline 100 mg twice daily,   Transitioned to prednisone, albuterol nebs Q4 hourly, Pulmicort nebs twice daily    Chronic CHFrEF  (41-49%)   Euvolemic  Added Jardiance and Aldactone     Acute on chronic kidney disease  Creatinine back to baseline, previous creatinine of 1.42  Held losartan     History of TIA  Aspirin 81 mg daily     History of B-cell lymphoma  Follow-up with his hematologist Dr. Rulon Abide at [facility] as outpatient  Oncology signed off, appreciate recommendations     Obstructive sleep apnea  Respiratory consult for CPAP machine     Coronary disease-patient says he was told he had cardiomegaly, never had any stent  On Coreg 3.125 mg p.o. twice daily     Hypertension  Holding off on antihypertensives as patient is normotensive    Hyperlipidemia  Crestor    Dysphagia  SLP evaluated and started on  diet  GI signed off, appreciate recommendations    PT/OT recommended STR. Most likely next week as it is [place].     Dispo anticipate hospital stay for 1 to 2 days.     PT/OT evals ordered?  Therapy evals ordered  Diet: ADULT DIET; Regular; Low Fat/Low Chol/High Fiber/2 gm Na  VTE prophylaxis: SCD's   Code status: Full Code  Code status discussed: Yes  Blood consent obtained: N/A      Non-peripheral Lines and Tubes (if present):          Telemetry (if present):  Cardiac/Telemetry Monitor On: Portable telemetry pack applied, Bedside monitor in use        Hospital Problems:  Principal Problem:    Atrial fibrillation with rapid ventricular response  (HCC)  Active Problems:    Chronic heart failure with mildly reduced ejection fraction (HFmrEF, 41-49%) (HCC)    HTN (hypertension)    HLD (hyperlipidemia)    Coronary atherosclerosis of native coronary artery    TIA (transient ischemic attack)    Amaurosis fugax of left eye    OSA (obstructive sleep apnea)    Stage 3 chronic kidney disease (HCC)    COVID    Moderate asthma with exacerbation    COVID-19 virus infection    Pancytopenia (HCC)  Resolved Problems:    * No resolved hospital problems. *      Objective:   Patient Vitals for the past 24 hrs:   Temp Pulse Resp BP SpO2   07/02/23 1220 97.9 F (36.6 C) 72 18 (!) 140/73 98 %   07/02/23 0822 -- 72 -- (!) 145/79 --   07/02/23 0808 -- -- -- -- 94 %   07/02/23 0402 97.7 F (36.5 C) 64 18 (!) 153/78 96 %   07/02/23 0027 98.6 F (37 C) 72 18 130/62 96 %   07/01/23 2004 98.2 F (36.8 C) 71 18 123/66 97 %   07/01/23 1942 -- 65 16 -- 93 %   07/01/23 1916 97.5 F (36.4 C) 55 18 (!) 141/70 92 %   07/01/23 1519 98.1 F (36.7 C) 74 18 119/66 94 %       Oxygen Therapy  SpO2: 98 %  Pulse via Oximetry: 108 beats per minute  Pulse Oximeter Device Mode: Intermittent  Pulse Oximeter Device Location: Finger  O2 Device: None (Room air)  O2 Flow Rate (L/min): 2 L/min    Estimated body mass index is 29.28 kg/m as calculated from the following:    Height as of this encounter: 1.676 m (5' 5.98").    Weight as of this encounter: 82.2 kg (181 lb 4.8 oz).    Intake/Output Summary (Last 24 hours) at 07/02/2023 1402  Last data filed at 07/02/2023 1306  Gross per 24 hour   Intake 2380 ml   Output 700 ml   Net 1680 ml         Physical Exam:     General:    Well nourished.  Ill-appearing  Head:  Normocephalic, atraumatic  Eyes:  Sclerae appear normal.  Pupils equally round.  ENT:  Nares appear normal.  Moist oral mucosa  Neck:  No restricted ROM.  Trachea midline   CV:   RRR.  No m/r/g.  No jugular venous distension.  Irregular heart beat  Lungs:   CTAB.  No  rhonchi, or rales.  Symmetric  expansion.  Bilateral wheezing is present  Abdomen:   Soft, nontender, nondistended.  Extremities: No cyanosis or clubbing.  No edema  Skin:     No rashes.  Normal coloration.   Warm and dry.    Neuro:  CN II-XII grossly intact.    Psych:  Normal mood and affect.      I have personally reviewed labs and tests:  Recent Labs:  Recent Results (from the past 48 hour(s))   CBC with Auto Differential    Collection Time: 07/01/23  4:07 AM   Result Value Ref Range    WBC 3.2 (L) 4.3 - 11.1 K/uL    RBC 3.55 (L) 4.23 - 5.6 M/uL    Hemoglobin 10.2 (L) 13.6 - 17.2 g/dL    Hematocrit 16.1 (L) 41.1 - 50.3 %    MCV 82.8 82 - 102 FL    MCH 28.7 26.1 - 32.9 PG    MCHC 34.7 31.4 - 35.0 g/dL    RDW 09.6 04.5 - 40.9 %    Platelets 107 (L) 150 - 450 K/uL    MPV 13.7 (H) 9.4 - 12.3 FL    nRBC 0.00 0.0 - 0.2 K/uL    Neutrophils % 60.7 43.0 - 78.0 %    Lymphocytes % 14.5 13.0 - 44.0 %    Monocytes % 17.0 (H) 4.0 - 12.0 %    Eosinophils % 0.0 (L) 0.5 - 7.8 %    Basophils % 0.6 0.0 - 2.0 %    Immature Granulocytes % 7.2 (H) 0.0 - 5.0 %    Neutrophils Absolute 1.94 1.70 - 8.20 K/UL    Lymphocytes Absolute 0.46 (L) 0.50 - 4.60 K/UL    Monocytes Absolute 0.54 0.10 - 1.30 K/UL    Eosinophils Absolute 0.00 0.00 - 0.80 K/UL    Basophils Absolute 0.02 0.00 - 0.20 K/UL    Immature Granulocytes Absolute 0.23 0.0 - 0.5 K/UL    RBC Comment ANISOCYTOSIS      RBC Comment SLIGHT  ANISOCYTOSIS + POIKILOCYTOSIS        WBC Comment Result Confirmed By Smear      Platelet Comment ADEQUATE      Differential Type AUTOMATED     Basic Metabolic Panel w/ Reflex to MG    Collection Time: 07/01/23  4:07 AM   Result Value Ref Range    Sodium 138 136 - 145 mmol/L    Potassium 4.6 3.5 - 5.1 mmol/L    Chloride 106 98 - 107 mmol/L    CO2 20 20 - 29 mmol/L    Anion Gap 12 7 - 16 mmol/L    Glucose 152 (H) 70 - 99 mg/dL    BUN 44 (H) 8 - 23 MG/DL    Creatinine 8.11 (H) 0.80 - 1.30 MG/DL    Est, Glom Filt Rate 45 (L) >60 ml/min/1.41m2    Calcium 9.0 8.8 - 10.2 MG/DL    Phosphorus    Collection Time: 07/01/23  4:07 AM   Result Value Ref Range    Phosphorus 3.4 2.5 - 4.5 MG/DL       No results for input(s): "COVID19" in the last 72 hours.      Current Meds:  Current Facility-Administered Medications   Medication Dose Route Frequency    [START ON 07/03/2023] spironolactone (ALDACTONE) tablet 25 mg  25 mg Oral Daily    empagliflozin (JARDIANCE) tablet 10 mg  10 mg Oral Daily    saliva substitute (BIOTENE/MOUTH KOTE) liquid   Oral PRN    ipratropium (ATROVENT) 0.02 % nebulizer solution 0.5 mg  0.5 mg Nebulization Q6H WA RT  predniSONE (DELTASONE) tablet 40 mg  40 mg Oral Daily    Benzocaine-Menthol (CEPACOL SORE THROAT) 15-2.6 MG lozenge 1 lozenge  1 lozenge Oral Q2H PRN    albuterol (PROVENTIL) (2.5 MG/3ML) 0.083% nebulizer solution 2.5 mg  2.5 mg Nebulization Q6H PRN    cetirizine (ZYRTEC) tablet 10 mg  10 mg Oral Daily    pantoprazole (PROTONIX) tablet 40 mg  40 mg Oral Daily    rosuvastatin (CRESTOR) tablet 30 mg  30 mg Oral Nightly    tamsulosin (FLOMAX) capsule 0.4 mg  0.4 mg Oral Daily    sodium chloride flush 0.9 % injection 5-40 mL  5-40 mL IntraVENous 2 times per day    sodium chloride flush 0.9 % injection 5-40 mL  5-40 mL IntraVENous PRN    0.9 % sodium chloride infusion   IntraVENous PRN    ondansetron (ZOFRAN-ODT) disintegrating tablet 4 mg  4 mg Oral Q8H PRN    Or    ondansetron (ZOFRAN) injection 4 mg  4 mg IntraVENous Q6H PRN    polyethylene glycol (GLYCOLAX) packet 17 g  17 g Oral Daily PRN    acetaminophen (TYLENOL) tablet 650 mg  650 mg Oral Q6H PRN    Or    acetaminophen (TYLENOL) suppository 650 mg  650 mg Rectal Q6H PRN    prednisoLONE acetate (PRED FORTE) 1 % ophthalmic suspension 1 drop  1 drop Left Eye Daily    atropine 1 % ophthalmic solution 1 drop  1 drop Left Eye Daily    brimonidine (ALPHAGAN) 0.2 % ophthalmic solution 1 drop  1 drop Left Eye TID    arformoterol tartrate (BROVANA) nebulizer solution 15 mcg  15 mcg Nebulization BID RT    cefTRIAXone  (ROCEPHIN) 1,000 mg in sterile water 10 mL IV syringe  1,000 mg IntraVENous Q24H    doxycycline hyclate (VIBRAMYCIN) capsule 100 mg  100 mg Oral 2 times per day    guaiFENesin (MUCINEX) extended release tablet 1,200 mg  1,200 mg Oral BID    apixaban (ELIQUIS) tablet 2.5 mg  2.5 mg Oral BID    carvedilol (COREG) tablet 6.25 mg  6.25 mg Oral BID WC    furosemide (LASIX) tablet 20 mg  20 mg Oral Daily    lactobacillus (CULTURELLE) capsule 1 capsule  1 capsule Oral Daily with breakfast       Signed:  Verdis Prime, MD    Part of this note may have been written by using a voice dictation software.  The note has been proof read but may still contain some grammatical/other typographical errors.

## 2023-07-03 LAB — BASIC METABOLIC PANEL
Anion Gap: 14 mmol/L (ref 7–16)
BUN: 56 mg/dL — ABNORMAL HIGH (ref 8–23)
CO2: 19 mmol/L — ABNORMAL LOW (ref 20–29)
Calcium: 9.4 mg/dL (ref 8.8–10.2)
Chloride: 108 mmol/L — ABNORMAL HIGH (ref 98–107)
Creatinine: 1.49 mg/dL — ABNORMAL HIGH (ref 0.80–1.30)
Est, Glom Filt Rate: 44 mL/min/{1.73_m2} — ABNORMAL LOW (ref >60)
Glucose: 151 mg/dL — ABNORMAL HIGH (ref 70–99)
Potassium: 4.5 mmol/L (ref 3.5–5.1)
Sodium: 141 mmol/L (ref 136–145)

## 2023-07-03 MED ORDER — SPIRONOLACTONE 25 MG PO TABS
25 MG | ORAL_TABLET | Freq: Every day | ORAL | 3 refills | Status: DC
Start: 2023-07-03 — End: 2023-07-14

## 2023-07-03 MED FILL — ELIQUIS 2.5 MG PO TABS: 2.5 MG | ORAL | Qty: 1

## 2023-07-03 MED FILL — DOXYCYCLINE HYCLATE 100 MG PO CAPS: 100 MG | ORAL | Qty: 1

## 2023-07-03 MED FILL — IPRATROPIUM BROMIDE 0.02 % IN SOLN: 0.02 % | RESPIRATORY_TRACT | Qty: 2.5

## 2023-07-03 MED FILL — ARFORMOTEROL TARTRATE 15 MCG/2ML IN NEBU: 152 MCG/2ML | RESPIRATORY_TRACT | Qty: 2

## 2023-07-03 MED FILL — MUCUS RELIEF ER 600 MG PO TB12: 600 MG | ORAL | Qty: 2

## 2023-07-03 MED FILL — FUROSEMIDE 20 MG PO TABS: 20 MG | ORAL | Qty: 1

## 2023-07-03 MED FILL — JARDIANCE 10 MG PO TABS: 10 MG | ORAL | Qty: 1

## 2023-07-03 MED FILL — PREDNISONE 20 MG PO TABS: 20 MG | ORAL | Qty: 2

## 2023-07-03 MED FILL — CULTURELLE PO CAPS: ORAL | Qty: 1

## 2023-07-03 MED FILL — SPIRONOLACTONE 25 MG PO TABS: 25 MG | ORAL | Qty: 1

## 2023-07-03 MED FILL — ROSUVASTATIN CALCIUM 10 MG PO TABS: 10 MG | ORAL | Qty: 1

## 2023-07-03 MED FILL — CETIRIZINE HCL 10 MG PO TABS: 10 MG | ORAL | Qty: 1

## 2023-07-03 MED FILL — CARVEDILOL 6.25 MG PO TABS: 6.25 MG | ORAL | Qty: 1

## 2023-07-03 MED FILL — TAMSULOSIN HCL 0.4 MG PO CAPS: 0.4 MG | ORAL | Qty: 1

## 2023-07-03 NOTE — Plan of Care (Signed)
Problem: Respiratory - Adult  Goal: Achieves optimal ventilation and oxygenation  Outcome: Progressing  Flowsheets  Taken 07/03/2023 0803 by Stefan Church, RCP  Achieves optimal ventilation and oxygenation:   Assess for changes in respiratory status   Position to facilitate oxygenation and minimize respiratory effort   Respiratory therapy support as indicated   Assess for changes in mentation and behavior   Oxygen supplementation based on oxygen saturation or arterial blood gases   Encourage broncho-pulmonary hygiene including cough, deep breathe, incentive spirometry   Assess and instruct to report shortness of breath or any respiratory difficulty  Taken 07/02/2023 2000 by Glorianne Manchester, RN  Achieves optimal ventilation and oxygenation:   Assess for changes in respiratory status   Assess for changes in mentation and behavior   Oxygen supplementation based on oxygen saturation or arterial blood gases   Position to facilitate oxygenation and minimize respiratory effort

## 2023-07-03 NOTE — Care Coordination-Inpatient (Addendum)
Discharge order is in. Pt is discharging home today in stable condition. Pt is A/Ox4 and decided to return home with [facility] and does not want to go to rehab. Pt was on oxygen, however, does not qualify for home oxygen per RT:  07/03/23 1044    Resting (Room Air)   SpO2 93   HR 62   During Walk (Room Air)   SpO2 94   Walk/Assistance Device Ambulation   Rate of Dyspnea 1   After Walk   SpO2 93   Symptoms Shortness of breath   Does the Patient Qualify for Home O2 No   Does the Patient Need Portable Oxygen Tanks No   CM attempted to contact the pts SIL listed on his FS but the number is incorrect and the pt did not want writer to bother his SIL and dtr b/c they have COVID too. Pt is agreeable to ambo transport and understands that his insurance may not cover the transportation. [facility] scheduled for 1530. No report needed b/c he lives in an ILF at [facility], [place] [address]. No other discharge needs identified by CM. Tx goals met.     07/03/23 1343   Services At/After Discharge   Transition of Care Consult (CM Consult) Home Health;Discharge Planning  ([facility])   Internal Home Health No   Reason Outside Agency Chosen Managed care specific requirement   Services At/After Discharge Home Health;PT;OT;Nursing services   [facility] Information Provided? Yes   Mode of Transport at Discharge [facility] Transport Time of Discharge 1530   Confirm Follow Up Transport Family   Condition of Participation: Discharge Planning   The Patient and/or Patient Representative was provided with a Choice of Provider? Patient   The Patient and/Or Patient Representative agree with the Discharge Plan? Yes   Freedom of Choice list was provided with basic dialogue that supports the patient's individualized plan of care/goals, treatment preferences, and shares the quality data associated with the providers?  Yes

## 2023-07-03 NOTE — Discharge Summary (Addendum)
Hospitalist Discharge Summary   Admit Date:  06/28/2023 10:56 AM   DC Note date: 07/03/2023  Name:  Paul Medina   Age:  89 y.o.  Sex:  male  DOB:  09-14-1930   MRN:  [id number]   Room:  416/01  PCP:  Unknown, Provider, ANP    Presenting Complaint: Cold Symptoms and Dehydration     Initial Admission Diagnosis: Dehydration [E86.0]  New onset atrial fibrillation (HCC) [I48.91]  AKI (acute kidney injury) (HCC) [N17.9]  Atrial fibrillation with rapid ventricular response (HCC) [I48.91]  COVID-19 virus infection [U07.1]     Problem List for this Hospitalization (present on admission):    Principal Problem:    Atrial fibrillation with rapid ventricular response (HCC)  Active Problems:    Chronic heart failure with mildly reduced ejection fraction (HFmrEF, 41-49%) (HCC)    HTN (hypertension)    HLD (hyperlipidemia)    Coronary atherosclerosis of native coronary artery    TIA (transient ischemic attack)    Amaurosis fugax of left eye    OSA (obstructive sleep apnea)    Stage 3 chronic kidney disease (HCC)    COVID    Moderate asthma with exacerbation    COVID-19 virus infection    Pancytopenia (HCC)  Resolved Problems:    * No resolved hospital problems. *      Hospital Course:  Paul Medina is a 89 y.o. male with medical history of asthma, hypertension, sleep apnea, TIA, acid reflux, blind in his left eye, history of B-cell lymphoma.  CKD 3 presented with  feeling weaker, tired, mild dry cough, recently finished a course of Zithromax. He also had a fall. As the symptoms persisted decided to come to emergency room for further evaluation.  WBC of 3.7, platelet of 105, hemoglobin of 10.7, bicarb of 17 creatinine 1.66, previous creatinine of 1.42, proBNP of 2539, initial troponin 64, repeat troponin of 68.  EKG new A-fib with RVR, with PVCs, rate of 118.  Chest x-ray- Questionable ill-defined opacity of the retrocardiac region  without other acute cardiopulmonary finding. Positive for COVID  For his new A-fib with RVR  patient received Cardizem 10 mg IV x 1 and presently on Cardizem drip.  He was treated with IV Solu-Medrol and IV antibiotics.  He was off of Cardizem and was in normal sinus rhythm.  Cardiology was consulted and adjusted his medications and added aldactone.  He was anticoagulated with Eliquis.  Defer long-term anticoagulation to his primary cardiologist in the outpatient setting and follow up with them in 3 months.  He was saturating well on room air.  He was transitioned to p.o. steroids.  His volume status is improved.  His creatinine improved back to baseline and losartan and Lasix was resumed.  He had leukopenia and oncologist was consulted.  Follow-up with Dr. Rulon Abide and placement as outpatient.  He complained of dysphagia.  SLP evaluated and started on soft diet.  GI was consulted and no EGD is required.  PT/OT recommended HHT.  His creatinine is stable around 1.49.  He was advised to follow-up with repeat BMP in 5 days.  He was saturating on 2 LNC and weaned off oxygen.  RT was consulted and he was not qualified for home oxygen.  Patient is hemodynamically stable for discharge.      Disposition: Home with Home Health  Diet: ADULT DIET; Dysphagia - Minced and Moist  ADULT ORAL NUTRITION SUPPLEMENT; Breakfast, Lunch, Dinner; Educational psychologist Protein Oral Supplement  Code Status: Full Code  Follow Ups:  Follow-up Information       Follow up With Specialties Details Why Contact Info    Dallie Piles, DO Hematology and Oncology, Hematology, Oncology Follow up in 1 week(s)  [address]  [place] [address]  [phone]      Unknown, Provider, ANP  Follow up in 1 week(s)      [facility]   [address] [place] [address]  [phone]            Future Appointments         Provider Specialty Dept Phone    11/02/2023 2:45 PM (Arrive by 2:30 PM) Cresenciano Lick, APRN - CNP Pulmonology [phone]               Follow up labs/diagnostics (ultimately defer to outpatient provider):  Follow-up with PCP in 3 to 5 days  Follow-up with cardiology in 1 month  Follow-up with BMP in 5 to 7 days    Plan was discussed with Patient.  All questions answered.  Patient was stable at time of discharge.  Instructions given to call a physician or return if any concerns.        Current Discharge Medication List        START taking these medications    Details   spironolactone (ALDACTONE) 25 MG tablet Take 1 tablet by mouth daily  Qty: 30 tablet, Refills: 3      sodium bicarbonate 650 MG tablet Take 1 tablet by mouth 4 times daily for 10 days  Qty: 40 tablet, Refills: 0      apixaban (ELIQUIS) 2.5 MG TABS tablet Take 1 tablet by mouth 2 times daily  Qty: 60 tablet, Refills: 0      empagliflozin (JARDIANCE) 10 MG tablet Take 1 tablet by mouth daily  Qty: 30 tablet, Refills: 0      carvedilol (COREG) 6.25 MG tablet Take 1 tablet by mouth 2 times daily (with meals)  Qty: 60 tablet, Refills: 0      predniSONE (DELTASONE) 20 MG tablet Take 2 tablets by mouth daily for 5 doses  Qty: 10 tablet, Refills: 0      furosemide (LASIX) 20 MG tablet Take 1 tablet by mouth daily  Qty: 60 tablet, Refills: 3      doxycycline hyclate (VIBRAMYCIN) 100 MG capsule Take 1 capsule by mouth every 12 hours for 6 doses  Qty: 6 capsule, Refills: 0      cefpodoxime (VANTIN) 200 MG tablet Take 1 tablet by mouth 2 times daily for 5 days  Qty: 10 tablet, Refills: 0           CONTINUE these medications which have NOT CHANGED    Details   ascorbic acid (VITAMIN C) 500 MG tablet Take 1 tablet by mouth daily      lactobacillus (CULTURELLE) CAPS capsule Take 1 capsule by mouth daily      lidocaine (LIDODERM) 5 % Place 1 patch onto the skin daily      loratadine (CLARITIN) 10 MG tablet Take 1 tablet by mouth daily      losartan (COZAAR) 25 MG tablet 1 tablet daily      Multiple Vitamin (MULTIVITAMIN) tablet Take 1 tablet by mouth daily      pantoprazole (PROTONIX) 40 MG  tablet Take 1 tablet by mouth daily      prednisoLONE acetate (PRED FORTE) 1 % ophthalmic suspension 1 drop 2 times daily  rosuvastatin (CRESTOR) 10 MG tablet 3 tablets daily      tamsulosin (FLOMAX) 0.4 MG capsule Take 1 capsule by mouth daily      sildenafil (VIAGRA) 100 MG tablet Take 1 tablet by mouth as needed      terazosin (HYTRIN) 5 MG capsule 2 capsules daily      tiotropium-olodaterol (STIOLTO) 2.5-2.5 MCG/ACT AERS Inhale 2 puffs into the lungs daily  Qty: 1 each, Refills: 11      albuterol sulfate HFA (PROVENTIL;VENTOLIN;PROAIR) 108 (90 Base) MCG/ACT inhaler Inhale 2 puffs into the lungs every 4 hours as needed for Shortness of Breath  Qty: 1 each, Refills: 11           STOP taking these medications       valsartan (DIOVAN) 40 MG tablet Comments:   Reason for Stopping:               Some of the medications may be marked as "stop taking" by the system; but in reality patient or family reported already being off these meds; defer to outpatient/prescribing providers.      Procedures done this admission:  * No surgery found *    Consults this admission:  IP CONSULT TO CARDIOLOGY  IP CONSULT TO RESPIRATORY CARE  IP CONSULT TO ONCOLOGY  IP CONSULT TO GI  IP CONSULT HOME HEALTH    Consultants will arrange their own follow ups, if applicable.    Echocardiogram results:  06/28/23    ECHO (TTE) COMPLETE (PRN CONTRAST/BUBBLE/STRAIN/3D) 06/29/2023  3:43 PM (Final)    Interpretation Summary    Left Ventricle: Moderately reduced left ventricular systolic function with a visually estimated EF of 30 - 35%. Left ventricle is moderately dilated. Normal wall thickness. Grade II diastolic dysfunction with increased LAP.    Aortic Valve: Mild regurgitation. Mild stenosis of the aortic valve (DI ~0.44).    Mitral Valve: Mild to moderate regurgitation with a posterior directed jet.    Left Atrium: Left atrium is mildly dilated.    Image quality is adequate. Contrast used: Lumason.    Signed by: Lenna Gilford, MD on  06/29/2023  3:43 PM      Diagnostic Imaging/Tests:   CT CHEST PULMONARY EMBOLISM W CONTRAST    Result Date: 06/29/2023  1.  No pulmonary embolism. No thoracic aortic aneurysm/dissection. 2.  Bibasilar pneumonia, greater on the left with trace parapneumonic effusions. 3.  No definite thoracic metastatic disease. 4.  Stable left adrenal nodule. Electronically signed by Elsie Stain    XR CHEST PORTABLE    Result Date: 06/28/2023  Findings/impression: Questionable ill-defined opacity of the retrocardiac region without other acute cardiopulmonary finding. Dedicated PA/lateral chest would be useful for further evaluation. Electronically signed by Gifford Shave    CT Head W/O Contrast    Result Date: 06/20/2023  No CT evidence of acute intracranial abnormality. Electronically signed by Lala Lund    XR CHEST PORTABLE    Result Date: 06/20/2023  No acute findings in the chest Electronically signed by Sandi Mealy       Labs: Results:       BMP, Mg, Phos Recent Labs     07/01/23  0407 07/03/23  1015   NA 138 141   K 4.6 4.5   CL 106 108*   CO2 20 19*   ANIONGAP 12 14   BUN 44* 56*   CREATININE 1.46* 1.49*   LABGLOM 45* 44*   CALCIUM 9.0 9.4   GLUCOSE 152* 151*  PHOS 3.4  --       CBC Recent Labs     07/01/23  0407   WBC 3.2*   RBC 3.55*   HGB 10.2*   HCT 29.4*   MCV 82.8   MCH 28.7   MCHC 34.7   RDW 14.6   PLT 107*   MPV 13.7*   NRBC 0.00   LYMPHOPCT 14.5   MONOPCT 17.0*   BASOPCT 0.6   IMMGRAN 7.2*   LYMPHSABS 0.46*   EOSABS 0.00   MONOSABS 0.54   BASOSABS 0.02   ABSIMMGRAN 0.23      LFT No results for input(s): "BILITOT", "BILIDIR", "ALKPHOS", "AST", "ALT", "PROTEIN", "ALBUMIN", "GLOB" in the last 72 hours.     Cardiac  No results found for: "NTPROBNP", "TROPHS"   Coags Lab Results   Component Value Date/Time    PROTIME 15.1 06/29/2023 10:15 AM    INR 1.2 06/29/2023 10:15 AM    APTT 33.9 06/29/2023 10:15 AM      A1c No results found for: "LABA1C", "EAG"   Lipids No results found for: "CHOL", "HDL", "CHOLHDLRATIO", "TRIG"    Thyroid  No results found for: "TSHELE", "TSH3GEN"     Most Recent UA Lab Results   Component Value Date/Time    COLORU YELLOW/STRAW 06/29/2023 04:50 AM    APPEARANCE CLEAR 06/29/2023 04:50 AM    PROTEINU Negative 06/29/2023 04:50 AM    GLUCOSEU Negative 06/29/2023 04:50 AM    KETUA TRACE 06/29/2023 04:50 AM    BILIRUBINUR Negative 06/29/2023 04:50 AM    BLOODU Negative 06/29/2023 04:50 AM    UROBILINOGEN 0.2 06/29/2023 04:50 AM    NITRU Negative 06/29/2023 04:50 AM    LEUKOCYTESUR Negative 06/29/2023 04:50 AM    WBCUA 0-4 06/29/2023 04:50 AM    RBCUA 0-5 06/29/2023 04:50 AM    BACTERIA Negative 06/29/2023 04:50 AM    LABCAST 0 06/29/2023 04:50 AM    MUCUS 0 06/29/2023 04:50 AM        Microbiology:  Results       Procedure Component Value Units Date/Time    Influenza A/B, Molecular [[id number]] Collected: 06/28/23 1216    Order Status: Completed Specimen: Nasopharyngeal Updated: 06/28/23 1310     Influenza A, NAA Not detected        Comment: Negative results do not preclude infection with influenza virus and should not be the sole basis of a patient treatment decision.        Influenza B, NAA Not detected       COVID-19, Rapid [[id number]]  (Abnormal) Collected: 06/28/23 1215    Order Status: Completed Specimen: Nasopharyngeal Updated: 06/28/23 1256     Source NASAL        SARS-CoV-2, Rapid Detected        Comment:    The specimen is POSITIVE for SARS-CoV-2, the novel coronavirus associated with COVID-19.     This test has been authorized by the FDA under an Emergency Use Authorization (EUA) for use by authorized laboratories.      Fact sheet for Healthcare Providers: FlickSafe.gl  Fact sheet for Patients: [place].at     Methodology: Isothermal Nucleic Acid Amplification         Influenza A/B, Molecular [[id number]] Collected: 06/20/23 1115    Order Status: Completed Specimen: Nasopharyngeal Updated: 06/20/23 1139     Influenza A, NAA Not detected         Comment: Negative results do not preclude infection with influenza virus and should not be the  sole basis of a patient treatment decision.        Influenza B, NAA Not detected               All Labs from Last 24 Hrs:  Recent Results (from the past 24 hour(s))   Basic Metabolic Panel    Collection Time: 07/03/23 10:15 AM   Result Value Ref Range    Sodium 141 136 - 145 mmol/L    Potassium 4.5 3.5 - 5.1 mmol/L    Chloride 108 (H) 98 - 107 mmol/L    CO2 19 (L) 20 - 29 mmol/L    Anion Gap 14 7 - 16 mmol/L    Glucose 151 (H) 70 - 99 mg/dL    BUN 56 (H) 8 - 23 MG/DL    Creatinine 1.61 (H) 0.80 - 1.30 MG/DL    Est, Glom Filt Rate 44 (L) >60 ml/min/1.79m2    Calcium 9.4 8.8 - 10.2 MG/DL       No results for input(s): "COVID19" in the last 72 hours.      Recent Vital Data:  Patient Vitals for the past 24 hrs:   Temp Pulse Resp BP SpO2   07/03/23 1056 98.1 F (36.7 C) 93 16 133/75 98 %   07/03/23 0802 -- 72 16 -- 96 %   07/03/23 0745 98.1 F (36.7 C) 72 18 139/79 --   07/03/23 0343 97.5 F (36.4 C) 67 20 (!) 104/90 97 %   07/03/23 0003 97.7 F (36.5 C) 71 19 129/74 94 %   07/02/23 2043 97.9 F (36.6 C) 72 15 126/70 96 %   07/02/23 1941 -- 72 16 -- 96 %   07/02/23 1648 -- -- -- 130/83 --   07/02/23 1525 97.9 F (36.6 C) 73 18 96/63 95 %   07/02/23 1423 -- -- -- -- 94 %       Oxygen Therapy  SpO2: 98 %  Pulse via Oximetry: 108 beats per minute  Pulse Oximeter Device Mode: Intermittent  Pulse Oximeter Device Location: Finger  O2 Device: Nasal cannula  O2 Flow Rate (L/min): 2 L/min    Estimated body mass index is 29.28 kg/m as calculated from the following:    Height as of this encounter: 1.676 m (5' 5.98").    Weight as of this encounter: 82.2 kg (181 lb 4.8 oz).    Intake/Output Summary (Last 24 hours) at 07/03/2023 1245  Last data filed at 07/03/2023 1056  Gross per 24 hour   Intake 680 ml   Output 1100 ml   Net -420 ml         Physical Exam:  General:    Well nourished.  No overt distress  Head:  Normocephalic,  atraumatic  Eyes:  Sclerae appear normal.  Pupils equally round.    HENT:  Nares appear normal, no drainage.  Moist mucous membranes  Neck:  No restricted ROM.  Trachea midline  CV:   RRR.  No m/r/g.  No JVD  Lungs:   CTAB.  No wheezing, rhonchi, or rales.  Respirations even, unlabored  Abdomen:   Soft, nontender, nondistended.    Extremities: Warm and dry.   No edema.    Skin:     No rashes.  Normal coloration  Neuro:  CN II-XII grossly intact.  Psych:  Normal mood and affect.        Time spent in patient discharge and coordination 39 minutes.      Signed:  Verdis Prime, MD  Part of this note may have been written by using a voice dictation software.  The note has been proof read but may still contain some grammatical/other typographical errors.

## 2023-07-03 NOTE — Progress Notes (Signed)
[facility] PROGRESS NOTE           07/03/2023 9:32 AM    Admit Date: 06/28/2023      Subjective:     In sinus rhythm.  On room air.  States improved symptoms compared to presentation.  Denies any chest discomfort.    ROS:  Cardiovascular:  As noted above    Objective:      Vitals:    07/03/23 0343 07/03/23 0745 07/03/23 0802 07/03/23 1056   BP: (!) 104/90 139/79  133/75   Pulse: 67 72 72 93   Resp: 20 18 16 16    Temp: 97.5 F (36.4 C) 98.1 F (36.7 C)  98.1 F (36.7 C)   TempSrc:       SpO2: 97%  96% 98%   Weight:       Height:           Physical Exam:  General-No Acute Distress  Neck- supple, no JVD  CV- regular rate and rhythm no MRG  Lung- clear bilaterally  Abd- soft, nontender, nondistended  Ext- no edema bilaterally.  Skin- warm and dry    Data Review:   Recent Labs     07/01/23  0407 07/03/23  1015   NA 138 141   K 4.6 4.5   BUN 44* 56*   WBC 3.2*  --    HGB 10.2*  --    HCT 29.4*  --    PLT 107*  --        Assessment/Plan:     Principal Problem:    Atrial fibrillation with rapid ventricular response (HCC)  -Currently in sinus rhythm; likely triggered by acute underlying issues.  -Has been initiated on Eliquis; defer long-term anticoagulation to primary cardiologist in the outpatient setting.  No prior history of AF.  -Closely monitor Eliquis dosing based on renal function.  Creatinine currently borderline and on low-dose Eliquis.  Reassess prior to discharge; if creatinine less than 1.5 on discharge, will need 5 mg twice daily..  Creatinine at 1.49 from 07/03/2023    Active Problems:    Chronic heart failure with mildly reduced ejection fraction (HFmrEF, 41-49%) (HCC)  -Stated mild left ventricular dysfunction from [facility] from echo from 05/2020; prior remote PCI to the LAD.  -Continue Coreg/losartan; exam currently euvolemic.  Echocardiogram with moderate left ventricular dysfunction from current stay.  Added on SGLT2 inhibitor.  Initiated Aldactone and increased dosing to 25 mg  daily.  -Consideration for ARNI as an outpatient per primary cardiologist; currently with some lower systolics and defer further titration.  -Plan repeat echo per primary cardiologist in the outpatient setting in about 3 months once optimized on therapy for left ventricular dysfunction      HTN (hypertension)  -Target less than 130/80.      HLD (hyperlipidemia)  -Continue current Crestor      COVID    Moderate asthma with exacerbation  -Per primary team     Disposition-patient will need follow-up with primary cardiologist at [facility] in 3 to 4 weeks postdischarge.  Will need a BMP in 5 to 7 days postdischarge.    Lenna Gilford, MD  07/03/2023 9:32 AM

## 2023-07-03 NOTE — Progress Notes (Signed)
07/03/23 1044   Resting (Room Air)   SpO2 93   HR 62   During Walk (Room Air)   SpO2 94   Walk/Assistance Device Ambulation   Rate of Dyspnea 1   After Walk   SpO2 93   Symptoms Shortness of breath   Does the Patient Qualify for Home O2 No   Does the Patient Need Portable Oxygen Tanks No

## 2023-07-03 NOTE — Care Coordination-Inpatient (Signed)
CM accessed chart to provide additional documentation for transport.

## 2023-07-04 ENCOUNTER — Inpatient Hospital Stay
Admission: EM | Admit: 2023-07-04 | Discharge: 2023-07-21 | Disposition: A | Discharge status: Home Health | Payer: PRIVATE HEALTH INSURANCE

## 2023-07-04 ENCOUNTER — Emergency Department: Admit: 2023-07-04 | Payer: PRIVATE HEALTH INSURANCE | Primary: Diagnostic Radiology

## 2023-07-04 DIAGNOSIS — J189 Pneumonia, unspecified organism: Secondary | ICD-10-CM

## 2023-07-04 DIAGNOSIS — A4189 Other specified sepsis: Principal | ICD-10-CM

## 2023-07-04 LAB — CBC WITH AUTO DIFFERENTIAL
Basophils %: 0.6 % (ref 0.0–2.0)
Basophils Absolute: 0.08 10*3/uL (ref 0.00–0.20)
Eosinophils %: 0 % — ABNORMAL LOW (ref 0.5–7.8)
Eosinophils Absolute: 0 10*3/uL (ref 0.00–0.80)
Hematocrit: 32.6 % — ABNORMAL LOW (ref 41.1–50.3)
Hemoglobin: 11.2 g/dL — ABNORMAL LOW (ref 13.6–17.2)
Immature Granulocytes %: 5.2 % — ABNORMAL HIGH (ref 0.0–5.0)
Immature Granulocytes Absolute: 0.66 10*3/uL — ABNORMAL HIGH (ref 0.0–0.5)
Lymphocytes %: 23.1 % (ref 13.0–44.0)
Lymphocytes Absolute: 2.92 10*3/uL (ref 0.50–4.60)
MCH: 29.5 pg (ref 26.1–32.9)
MCHC: 34.4 g/dL (ref 31.4–35.0)
MCV: 85.8 fL (ref 82.0–102.0)
MPV: 13.5 fL — ABNORMAL HIGH (ref 9.4–12.3)
Monocytes %: 18.6 % — ABNORMAL HIGH (ref 4.0–12.0)
Monocytes Absolute: 2.35 10*3/uL — ABNORMAL HIGH (ref 0.10–1.30)
Neutrophils %: 52.5 % (ref 43.0–78.0)
Neutrophils Absolute: 6.62 10*3/uL (ref 1.70–8.20)
Platelets: 213 10*3/uL (ref 150–450)
RBC: 3.8 M/uL — ABNORMAL LOW (ref 4.23–5.6)
RDW: 14.6 % (ref 11.9–14.6)
WBC: 12.6 10*3/uL — ABNORMAL HIGH (ref 4.3–11.1)
nRBC: 0 10*3/uL (ref 0.0–0.2)

## 2023-07-04 LAB — COMPREHENSIVE METABOLIC PANEL
ALT: 43 U/L (ref 8–55)
AST: 30 U/L (ref 15–37)
Albumin/Globulin Ratio: 1.4 (ref 1.0–1.9)
Albumin: 3.8 g/dL (ref 3.2–4.6)
Alk Phosphatase: 72 U/L (ref 40–129)
Anion Gap: 18 mmol/L — ABNORMAL HIGH (ref 7–16)
BUN: 52 mg/dL — ABNORMAL HIGH (ref 8–23)
CO2: 20 mmol/L (ref 20–29)
Calcium: 9.5 mg/dL (ref 8.8–10.2)
Chloride: 105 mmol/L (ref 98–107)
Creatinine: 1.46 mg/dL — ABNORMAL HIGH (ref 0.80–1.30)
Est, Glom Filt Rate: 45 mL/min/{1.73_m2} — ABNORMAL LOW (ref >60)
Globulin: 2.7 g/dL (ref 2.3–3.5)
Glucose: 117 mg/dL — ABNORMAL HIGH (ref 70–99)
Potassium: 3.8 mmol/L (ref 3.5–5.1)
Sodium: 143 mmol/L (ref 136–145)
Total Bilirubin: 0.6 mg/dL (ref 0.0–1.2)
Total Protein: 6.5 g/dL (ref 6.3–8.2)

## 2023-07-04 LAB — PROCALCITONIN: Procalcitonin: 0.08 ng/mL (ref 0.00–0.10)

## 2023-07-04 LAB — LACTIC ACID
Lactic Acid: 3.7 mmol/L (ref 0.5–2.0)
Lactic Acid: 2.5 mmol/L — ABNORMAL HIGH (ref 0.5–2.0)

## 2023-07-04 LAB — D-DIMER, QUANTITATIVE: D-Dimer, Quant: 1.75 ug{FEU}/mL — ABNORMAL HIGH (ref <0.56)

## 2023-07-04 MED ORDER — SODIUM CHLORIDE 0.9 % IV SOLN
0.9 % | INTRAVENOUS | Status: DC | PRN
Start: 2023-07-04 — End: 2023-07-21

## 2023-07-04 MED ORDER — POTASSIUM CHLORIDE 10 MEQ/100ML IV SOLN
10100 MEQ/0ML | INTRAVENOUS | Status: DC | PRN
Start: 2023-07-04 — End: 2023-07-21

## 2023-07-04 MED ORDER — POTASSIUM CHLORIDE CRYS ER 20 MEQ PO TBCR
20 MEQ | ORAL | Status: DC | PRN
Start: 2023-07-04 — End: 2023-07-21

## 2023-07-04 MED ORDER — PIPERACILLIN SOD-TAZOBACTAM SO 3.375 (3-0.375) G IV SOLR
3.3753-0.375 (3-0.375) g | Freq: Three times a day (TID) | INTRAVENOUS | Status: AC
Start: 2023-07-04 — End: 2023-07-11
  Administered 2023-07-05 – 2023-07-09 (×14): 3375 mg via INTRAVENOUS

## 2023-07-04 MED ORDER — LINEZOLID 600 MG/300ML IV SOLN
600300 MG/300ML | Freq: Two times a day (BID) | INTRAVENOUS | Status: AC
Start: 2023-07-04 — End: 2023-07-12
  Administered 2023-07-05: 11:00:00 600 mg via INTRAVENOUS

## 2023-07-04 MED ORDER — ACETAMINOPHEN 325 MG PO TABS
325 MG | Freq: Four times a day (QID) | ORAL | Status: DC | PRN
Start: 2023-07-04 — End: 2023-07-21

## 2023-07-04 MED ORDER — PANTOPRAZOLE SODIUM 40 MG PO TBEC
40 MG | Freq: Every day | ORAL | Status: DC
Start: 2023-07-04 — End: 2023-07-21
  Administered 2023-07-10 – 2023-07-21 (×10): 40 mg via ORAL

## 2023-07-04 MED ORDER — IPRATROPIUM-ALBUTEROL 0.5-2.5 (3) MG/3ML IN SOLN
0.5-2.533 (3) MG/3ML | RESPIRATORY_TRACT | Status: AC
Start: 2023-07-04 — End: 2023-07-08
  Administered 2023-07-05 – 2023-07-08 (×13): 1 via RESPIRATORY_TRACT

## 2023-07-04 MED ORDER — MAGNESIUM SULFATE 2000 MG/50 ML IVPB PREMIX
250 GM/50ML | INTRAVENOUS | Status: DC | PRN
Start: 2023-07-04 — End: 2023-07-21

## 2023-07-04 MED ORDER — SODIUM BICARBONATE 650 MG PO TABS
650 MG | Freq: Four times a day (QID) | ORAL | Status: AC
Start: 2023-07-04 — End: 2023-07-13
  Administered 2023-07-05: 02:00:00 650 mg via ORAL

## 2023-07-04 MED ORDER — POLYETHYLENE GLYCOL 3350 17 G PO PACK
17 g | Freq: Every day | ORAL | Status: DC | PRN
Start: 2023-07-04 — End: 2023-07-21

## 2023-07-04 MED ORDER — SODIUM CHLORIDE 0.9 % IV BOLUS
0.9 | Freq: Once | INTRAVENOUS | Status: AC
Start: 2023-07-04 — End: 2023-07-04
  Administered 2023-07-04: 18:00:00 1000 mL via INTRAVENOUS

## 2023-07-04 MED ORDER — IOPAMIDOL 76 % IV SOLN
76 | Freq: Once | INTRAVENOUS | Status: AC | PRN
Start: 2023-07-04 — End: 2023-07-04
  Administered 2023-07-04: 19:00:00 100 mL via INTRAVENOUS

## 2023-07-04 MED ORDER — DOXAZOSIN MESYLATE 4 MG PO TABS
4 MG | Freq: Every day | ORAL | Status: DC
Start: 2023-07-04 — End: 2023-07-21
  Administered 2023-07-05 – 2023-07-21 (×16): 4 mg via ORAL

## 2023-07-04 MED ORDER — SODIUM CHLORIDE 0.9 % IV SOLN (MINI-BAG)
0.9 | INTRAVENOUS | Status: AC
Start: 2023-07-04 — End: 2023-07-04
  Administered 2023-07-04: 18:00:00 4500 mg via INTRAVENOUS

## 2023-07-04 MED ORDER — GUAIFENESIN ER 600 MG PO TB12
600 MG | Freq: Two times a day (BID) | ORAL | Status: DC
Start: 2023-07-04 — End: 2023-07-20
  Administered 2023-07-05 – 2023-07-20 (×32): 600 mg via ORAL

## 2023-07-04 MED ORDER — FUROSEMIDE 20 MG PO TABS
20 | Freq: Every day | ORAL | Status: DC
Start: 2023-07-04 — End: 2023-07-04

## 2023-07-04 MED ORDER — POTASSIUM BICARB-CITRIC ACID 20 MEQ PO TBEF
20 MEQ | ORAL | Status: DC | PRN
Start: 2023-07-04 — End: 2023-07-21

## 2023-07-04 MED ORDER — ONDANSETRON 4 MG PO TBDP
4 MG | Freq: Three times a day (TID) | ORAL | Status: DC | PRN
Start: 2023-07-04 — End: 2023-07-21

## 2023-07-04 MED ORDER — NORMAL SALINE FLUSH 0.9 % IV SOLN
0.9 % | INTRAVENOUS | Status: DC | PRN
Start: 2023-07-04 — End: 2023-07-21
  Administered 2023-07-07: 22:00:00 10 mL via INTRAVENOUS

## 2023-07-04 MED ORDER — FUROSEMIDE 20 MG PO TABS
20 MG | Freq: Every day | ORAL | Status: AC
Start: 2023-07-04 — End: 2023-07-21
  Administered 2023-07-05 – 2023-07-21 (×9): 20 mg via ORAL

## 2023-07-04 MED ORDER — ONDANSETRON HCL 4 MG/2ML IJ SOLN
42 MG/2ML | Freq: Four times a day (QID) | INTRAMUSCULAR | Status: DC | PRN
Start: 2023-07-04 — End: 2023-07-21

## 2023-07-04 MED ORDER — APIXABAN 2.5 MG PO TABS
2.5 MG | Freq: Two times a day (BID) | ORAL | Status: AC
Start: 2023-07-04 — End: 2023-07-05
  Administered 2023-07-05: 02:00:00 2.5 mg via ORAL

## 2023-07-04 MED ORDER — ROSUVASTATIN CALCIUM 20 MG PO TABS
20 MG | Freq: Every evening | ORAL | Status: AC
Start: 2023-07-04 — End: 2023-07-06
  Administered 2023-07-05 – 2023-07-06 (×2): 20 mg via ORAL

## 2023-07-04 MED ORDER — CARVEDILOL 6.25 MG PO TABS
6.25 MG | Freq: Two times a day (BID) | ORAL | Status: DC
Start: 2023-07-04 — End: 2023-07-21
  Administered 2023-07-04 – 2023-07-21 (×33): 6.25 mg via ORAL

## 2023-07-04 MED ORDER — PREDNISONE 20 MG PO TABS
20 MG | Freq: Every day | ORAL | Status: AC
Start: 2023-07-04 — End: 2023-07-09
  Administered 2023-07-04 – 2023-07-08 (×5): 40 mg via ORAL

## 2023-07-04 MED ORDER — NORMAL SALINE FLUSH 0.9 % IV SOLN
0.9 % | Freq: Two times a day (BID) | INTRAVENOUS | Status: DC
Start: 2023-07-04 — End: 2023-07-21
  Administered 2023-07-05 – 2023-07-21 (×28): 10 mL via INTRAVENOUS

## 2023-07-04 MED ORDER — PANTOPRAZOLE SODIUM 40 MG PO TBEC
40 | Freq: Every day | ORAL | Status: DC
Start: 2023-07-04 — End: 2023-07-04

## 2023-07-04 MED ORDER — ACETAMINOPHEN 650 MG RE SUPP
650 MG | Freq: Four times a day (QID) | RECTAL | Status: DC | PRN
Start: 2023-07-04 — End: 2023-07-21

## 2023-07-04 MED ORDER — LINEZOLID 600 MG/300ML IV SOLN
600300 | Freq: Once | INTRAVENOUS | Status: AC
Start: 2023-07-04 — End: 2023-07-04
  Administered 2023-07-04: 20:00:00 600 mg via INTRAVENOUS

## 2023-07-04 MED ORDER — DOXAZOSIN MESYLATE 4 MG PO TABS
4 | Freq: Every day | ORAL | Status: DC
Start: 2023-07-04 — End: 2023-07-04

## 2023-07-04 MED FILL — LINEZOLID 600 MG/300ML IV SOLN: 600300 MG/300ML | INTRAVENOUS | Qty: 300

## 2023-07-04 MED FILL — PREDNISONE 20 MG PO TABS: 20 MG | ORAL | Qty: 2

## 2023-07-04 MED FILL — PIPERACILLIN SOD-TAZOBACTAM SO 4.5 (4-0.5) G IV SOLR: 4.54-0.5 (4-0.5) g | INTRAVENOUS | Qty: 4500

## 2023-07-04 MED FILL — DOXAZOSIN MESYLATE 4 MG PO TABS: 4 MG | ORAL | Qty: 1

## 2023-07-04 MED FILL — ROSUVASTATIN CALCIUM 20 MG PO TABS: 20 MG | ORAL | Qty: 1

## 2023-07-04 MED FILL — CARVEDILOL 6.25 MG PO TABS: 6.25 MG | ORAL | Qty: 1

## 2023-07-04 NOTE — ED Triage Notes (Signed)
PT BIB EMS from [facility] on [address] with c/o SOB.     PT/EMS reports COVID + 5 days prior and was given Zpak x5 days.     PT reports increased SOB, audible wheezing.     ESM en route:  20g L hand  125mg  solu medrol  Duoneb  Albuterol- 10mg 

## 2023-07-04 NOTE — ED Notes (Signed)
Pt to CT at this time.

## 2023-07-04 NOTE — Progress Notes (Signed)
TRANSFER - IN REPORT:    Verbal report received from [place], RN on Paul Medina  being received from [facility] ED for routine progression of patient care      Report consisted of patient's Situation, Background, Assessment and   Recommendations(SBAR).     Information from the following report(s) ED Encounter Summary, ED SBAR, [facility], and Recent Results was reviewed with the receiving nurse.    Opportunity for questions and clarification was provided.      Assessment completed upon patient's arrival to unit and care assumed.

## 2023-07-04 NOTE — Care Coordination-Inpatient (Addendum)
Chart reviewed by SW. Patient discharged from [facility] to his home with [facility] arranged - did not qualify for home O2 per 3 step qualifier. ER visit today for SOB. SW will continue to follow ER work up for potential new or additional needs.     Discussed with ER MD, anticipate admit. CMI, Milestones, ACP, and Readmission completed.          07/04/23 1526   Service Assessment   Patient Orientation Alert and Oriented   Cognition Alert   History Provided By Patient;Medical Record   Primary Caregiver Self   Support Systems Children;Family Members   Patient's Healthcare Decision Maker is: Legal Next of Kin   PCP Verified by CM Yes   Prior Functional Level Assistance with the following:;Mobility;Housework;Shopping   Current Functional Level Other (see comment)  (TBD)   Can patient return to prior living arrangement Unknown at present   Ability to make needs known: Good   Family able to assist with home care needs: Yes   Would you like for me to discuss the discharge plan with any other family members/significant others, and if so, who? 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[place])   Community Resources Other (Comment)  (HH)   Social/Functional History   Lives With Alone   Type of Home Senior housing apartment   Home Layout One level   Home Access Level entry;Elevator   Bathroom Shower/Tub Walk-in Theme park manager Grab bars in shower;Grab bars around toilet   Bathroom Accessibility Accessible   Home Equipment None   Receives Help From Family   Prior Level of Assist for ADLs Independent   Prior Level of Assist for [employer] Independent   Ambulation Assistance Independent   Prior Level of Assist for Transfers Independent   Occupation Retired   Discharge Planning   Patient expects to be discharged to: Unknown         Rinaldo Ratel, LMSW  Medical Social Worker  [facility]

## 2023-07-04 NOTE — ED Notes (Signed)
TRANSFER - OUT REPORT:    Verbal report given to Dahlia Client, RN on Montavis Schubring  being transferred to [facility] for routine progression of patient care       Report consisted of patient's Situation, Background, Assessment and   Recommendations(SBAR).     Information from the following report(s) ED SBAR was reviewed with the receiving nurse.    Lines:   Peripheral IV 06/28/23 Left;Anterior Forearm (Active)       Peripheral IV 07/04/23 Left;Posterior Wrist (Active)   Site Assessment Clean, dry & intact 07/04/23 1228   Line Status Blood return noted 07/04/23 1228   Line Care Connections checked and tightened 07/04/23 1228   Phlebitis Assessment No symptoms 07/04/23 1228   Infiltration Assessment 0 07/04/23 1228   Dressing Status Clean, dry & intact 07/04/23 1228   Dressing Type Transparent 07/04/23 1228        Opportunity for questions and clarification was provided.      Patient transported with:  [employer]

## 2023-07-04 NOTE — H&P (Addendum)
Hospitalist History and Physical   Admit Date:  07/04/2023 11:46 AM   Name:  Paul Medina   Age:  89 y.o.  Sex:  male  DOB:  [birth year]   MRN:  [id number]   Room:  ER04/04    Presenting/Chief Complaint: Shortness of Breath     Reason(s) for Admission: Severe sepsis (HCC) [A41.9, R65.20]     History of Present Illness:   Paul Medina is a 89 y.o. male with medical history of A-fib, CHF with reduced ejection fraction, hypertension, hyperlipidemia, CAD, TIA, left eye blindness, OSA, CKD stage III, asthma who presented with worsening cough and shortness of breath.  Of note patient was recently hospitalized at [facility] [place] and discharged yesterday.  Patient was hospitalized during that visit for A-fib with RVR and community-acquired pneumonia, asthma exacerbation, and COVID-19 pneumonia.  Patient was discharged home to his studio apartment where he lives alone but has children in the area.  He states that his shortness of breath got worse and he was not eating or drinking.  During last hospitalization patient was evaluated by speech therapy as well as GI for dysphagia.  No EGD was recommended by GI at that time.  Speech therapy recommended minced and moist diet with thin liquids.  Patient states that he is still been having dysphagia and occasionally chokes on food/liquids.    On arrival to the ED notable labs include lactic acid 3.7 down trended to 0.5, Pro-Cal 0.08, WBCs 12 (3 at discharge on 2/10), creatinine 1.46 (near baseline).  CT chest PE protocol shows no PE but does show worsening right-sided pneumonia with mild improvement of left lung from previous exam, worsening mucous plugging in lower lobes raising concern for aspiration pneumonia/pneumonitis.  Patient started on Zyvox and Zosyn in ED, will continue.  Blood cultures obtained.  Speech therapy consulted for concern of aspiration.    Patient evaluated at bedside.  Resting in bed at time of exam on room air.  Wet cough noted throughout  exam.  Patient endorses shortness of breath but denies chest pain, abdominal pain, nausea.  States he has occasional choking on food and liquids.      Assessment & Plan:     Severe sepsis:  -As evidenced by tachypnea, leukocytosis, lactic acid 3.7 in setting of hospital-acquired pneumonia  -Started on Zyvox and Zosyn, will continue  -Blood cultures obtained and pending  -UA unremarkable    Hospital-acquired pneumonia:  -Recent hospital admission for A-fib with RVR, community-acquired pneumonia, asthma exacerbation, and COVID-19  -Concern for aspiration pneumonia, n.p.o. until speech evaluation  -Chest CT PE protocol: No PE, pneumonia (right greater than left), mucous plugging with concern for aspiration  -Started on Zyvox and Zosyn on admission, will continue  -Mucinex twice daily  -Blood cultures obtained and pending    Atrial fibrillation, not in RVR:  -Recent hospital admission with RVR  -Continue home Eliquis, Coreg    Heart failure with reduced ejection fraction:  -Echocardiogram 06/29/2023: EF 30-35%, grade 2 diastolic dysfunction  -Seen by cardiology at last hospital admission  -Continue home Coreg  -Holding home losartan, SGLT2, Aldactone given severe sepsis, will restart when able  -Will need repeat outpatient echocardiogram in 3 months    History of TIA:  -Continue home aspirin 81 mg daily    History of B-cell lymphoma:  -Continue outpatient follow-up with Dr. Rulon Abide at [facility] following discharge    Obstructive sleep apnea:  -Respiratory consult for CPAP while inpatient    CAD:  -Continue home  aspirin, Coreg  -Will restart home losartan, SGLT2, and Aldactone when able, held in setting of severe sepsis    Hypertension:  -Holding antihypertensives in setting of severe sepsis    Hyperlipidemia:  -Continue home Crestor    BPH:  -Continue home Cardura      PT/OT evals ordered?  Therapy evals ordered  Diet: Diet NPO  VTE prophylaxis: Already on anticoagulation  Code status: DNR  Code status discussed: Yes  Blood  consent obtained: No      Non-peripheral Lines and Tubes (if present):             Hospital Problems:  Principal Problem:    Severe sepsis (HCC)  Active Problems:    Chronic heart failure with mildly reduced ejection fraction (HFmrEF, 41-49%) (HCC)    HTN (hypertension)    HLD (hyperlipidemia)    Coronary atherosclerosis of native coronary artery    TIA (transient ischemic attack)    OSA (obstructive sleep apnea)    Stage 3 chronic kidney disease (HCC)    Atrial fibrillation (HCC)    Hospital-acquired pneumonia  Resolved Problems:    * No resolved hospital problems. *        Objective:   Patient Vitals for the past 24 hrs:   Temp Pulse Resp BP SpO2   07/04/23 1700 -- -- -- (!) 146/75 96 %   07/04/23 1655 -- -- -- -- 96 %   07/04/23 1652 -- -- -- (!) 147/85 95 %   07/04/23 1647 -- -- -- -- 96 %   07/04/23 1515 -- -- -- 132/74 95 %   07/04/23 1500 -- -- -- (!) 145/75 95 %   07/04/23 1449 -- -- -- (!) 143/74 95 %   07/04/23 1330 -- 91 25 134/75 93 %   07/04/23 1315 -- 94 25 130/73 94 %   07/04/23 1300 -- 94 21 (!) 118/93 93 %   07/04/23 1255 -- 93 16 -- 93 %   07/04/23 1246 -- 96 16 -- 94 %   07/04/23 1245 -- -- 16 132/78 94 %   07/04/23 1239 -- 97 14 -- 93 %   07/04/23 1230 -- 99 13 131/76 92 %   07/04/23 1228 -- 99 16 -- 94 %   07/04/23 1217 -- -- -- -- 100 %   07/04/23 1215 -- -- -- 138/85 99 %   07/04/23 1200 -- -- -- 123/81 98 %   07/04/23 1147 98.2 F (36.8 C) 100 (!) 31 (!) 148/86 94 %       Oxygen Therapy  SpO2: 96 %  Pulse via Oximetry: 86 beats per minute    Estimated body mass index is 29.05 kg/m as calculated from the following:    Height as of this encounter: 1.676 m (5\' 6" ).    Weight as of this encounter: 81.6 kg (180 lb).  No intake or output data in the 24 hours ending 07/04/23 1723      Physical Exam:  General:    Elderly, resting in bed  Head:  Normocephalic, atraumatic  Eyes:  Sclerae appear normal.   ENT:  Nares appear normal.  Moist oral mucosa  Neck:  No restricted ROM.  Trachea midline   CV:    RRR.  No m/r/g.   Lungs:   Rales noted bilaterally.  Symmetric expansion.  Wet cough noted throughout exam, on room air  Abdomen:   Soft, nontender, nondistended.  Extremities: No cyanosis or clubbing.  No edema  Skin:  No rashes.  Normal coloration.   Warm and dry.    Neuro:  CN II-XII grossly intact.  Moves all extremities  Psych:  Normal mood and affect.      Orders Placed This Encounter   Medications    piperacillin-tazobactam (ZOSYN) 4,500 mg in sodium chloride 0.9 % 100 mL IVPB (mini-bag)     Order Specific Question:   Antimicrobial Indications     Answer:   Pneumonia (Nosocomial)     Order Specific Question:   Pnuemonia (Nosocomial) duration of therapy     Answer:   7 days    sodium chloride 0.9 % bolus 1,000 mL    linezolid (ZYVOX) IVPB 600 mg     Order Specific Question:   Antimicrobial Indications     Answer:   Pneumonia (Nosocomial)     Order Specific Question:   Pnuemonia (Nosocomial) duration of therapy     Answer:   7 days    iopamidol (ISOVUE-370) 76 % injection 100 mL    sodium chloride flush 0.9 % injection 5-40 mL    sodium chloride flush 0.9 % injection 5-40 mL    0.9 % sodium chloride infusion    OR Linked Order Group     potassium chloride (KLOR-CON M) extended release tablet 40 mEq     potassium bicarb-citric acid (EFFER-K) effervescent tablet 40 mEq     potassium chloride 10 mEq/100 mL IVPB (Peripheral Line)    magnesium sulfate 2000 mg in 50 mL IVPB premix    OR Linked Order Group     ondansetron (ZOFRAN-ODT) disintegrating tablet 4 mg     ondansetron (ZOFRAN) injection 4 mg    polyethylene glycol (GLYCOLAX) packet 17 g    OR Linked Order Group     acetaminophen (TYLENOL) tablet 650 mg     acetaminophen (TYLENOL) suppository 650 mg    linezolid (ZYVOX) IVPB 600 mg     Order Specific Question:   Antimicrobial Indications     Answer:   Pneumonia (Nosocomial)     Order Specific Question:   Pnuemonia (Nosocomial) duration of therapy     Answer:   7 days    piperacillin-tazobactam (ZOSYN)  3,375 mg in sodium chloride 0.9 % 50 mL IVPB (mini-bag)     Order Specific Question:   Antimicrobial Indications     Answer:   Pneumonia (Nosocomial)     Order Specific Question:   Pnuemonia (Nosocomial) duration of therapy     Answer:   7 days       Prior to Admit Medications:  Current Outpatient Medications   Medication Instructions    albuterol sulfate HFA (PROVENTIL;VENTOLIN;PROAIR) 108 (90 Base) MCG/ACT inhaler 2 puffs, Inhalation, EVERY 4 HOURS PRN    apixaban (ELIQUIS) 2.5 mg, Oral, 2 TIMES DAILY    ascorbic acid (VITAMIN C) 500 mg, Oral, DAILY    carvedilol (COREG) 6.25 mg, Oral, 2 TIMES DAILY WITH MEALS    cefpodoxime (VANTIN) 200 mg, Oral, 2 TIMES DAILY    empagliflozin (JARDIANCE) 10 mg, Oral, DAILY    furosemide (LASIX) 20 mg, Oral, DAILY    lactobacillus (CULTURELLE) CAPS capsule 1 capsule, Oral, DAILY    lidocaine (LIDODERM) 5 % 1 patch, TransDERmal, DAILY    loratadine (CLARITIN) 10 mg, Oral, DAILY    losartan (COZAAR) 25 mg, DAILY    Multiple Vitamin (MULTIVITAMIN) tablet 1 tablet, Oral, DAILY    pantoprazole (PROTONIX) 40 mg, Oral, DAILY    prednisoLONE acetate (PRED FORTE) 1 % ophthalmic suspension  1 drop, 2 TIMES DAILY    predniSONE (DELTASONE) 40 mg, Oral, DAILY    rosuvastatin (CRESTOR) 30 mg, DAILY    sildenafil (VIAGRA) 100 mg, Oral, PRN    sodium bicarbonate 650 mg, Oral, 4 TIMES DAILY    spironolactone (ALDACTONE) 25 mg, Oral, DAILY    tamsulosin (FLOMAX) 0.4 mg, Oral, DAILY    terazosin (HYTRIN) 10 mg, DAILY    tiotropium-olodaterol (STIOLTO) 2.5-2.5 MCG/ACT AERS 2 puffs, Inhalation, DAILY       I have personally reviewed labs and tests:  Recent Results (from the past 24 hour(s))   CBC with Auto Differential    Collection Time: 07/04/23 12:18 PM   Result Value Ref Range    WBC 12.6 (H) 4.3 - 11.1 K/uL    RBC 3.80 (L) 4.23 - 5.6 M/uL    Hemoglobin 11.2 (L) 13.6 - 17.2 g/dL    Hematocrit 36.6 (L) 41.1 - 50.3 %    MCV 85.8 82.0 - 102.0 FL    MCH 29.5 26.1 - 32.9 PG    MCHC 34.4 31.4 - 35.0  g/dL    RDW 44.0 34.7 - 42.5 %    Platelets 213 150 - 450 K/uL    MPV 13.5 (H) 9.4 - 12.3 FL    nRBC 0.00 0.0 - 0.2 K/uL    Differential Type AUTOMATED      Neutrophils % 52.5 43.0 - 78.0 %    Lymphocytes % 23.1 13.0 - 44.0 %    Monocytes % 18.6 (H) 4.0 - 12.0 %    Eosinophils % 0.0 (L) 0.5 - 7.8 %    Basophils % 0.6 0.0 - 2.0 %    Immature Granulocytes % 5.2 (H) 0.0 - 5.0 %    Neutrophils Absolute 6.62 1.70 - 8.20 K/UL    Lymphocytes Absolute 2.92 0.50 - 4.60 K/UL    Monocytes Absolute 2.35 (H) 0.10 - 1.30 K/UL    Eosinophils Absolute 0.00 0.00 - 0.80 K/UL    Basophils Absolute 0.08 0.00 - 0.20 K/UL    Immature Granulocytes Absolute 0.66 (H) 0.0 - 0.5 K/UL   Comprehensive Metabolic Panel    Collection Time: 07/04/23 12:18 PM   Result Value Ref Range    Sodium 143 136 - 145 mmol/L    Potassium 3.8 3.5 - 5.1 mmol/L    Chloride 105 98 - 107 mmol/L    CO2 20 20 - 29 mmol/L    Anion Gap 18 (H) 7 - 16 mmol/L    Glucose 117 (H) 70 - 99 mg/dL    BUN 52 (H) 8 - 23 MG/DL    Creatinine 9.56 (H) 0.80 - 1.30 MG/DL    Est, Glom Filt Rate 45 (L) >60 ml/min/1.55m2    Calcium 9.5 8.8 - 10.2 MG/DL    Total Bilirubin 0.6 0.0 - 1.2 MG/DL    ALT 43 8 - 55 U/L    AST 30 15 - 37 U/L    Alk Phosphatase 72 40 - 129 U/L    Total Protein 6.5 6.3 - 8.2 g/dL    Albumin 3.8 3.2 - 4.6 g/dL    Globulin 2.7 2.3 - 3.5 g/dL    Albumin/Globulin Ratio 1.4 1.0 - 1.9     Lactic Acid    Collection Time: 07/04/23 12:18 PM   Result Value Ref Range    Lactic Acid 3.7 (HH) 0.5 - 2.0 mmol/L   Procalcitonin    Collection Time: 07/04/23 12:18 PM   Result Value Ref Range  Procalcitonin 0.08 0.00 - 0.10 ng/mL   D-Dimer, Quantitative    Collection Time: 07/04/23 12:18 PM   Result Value Ref Range    D-Dimer, Quant 1.75 (H) <0.56 ug/ml(FEU)   Lactic Acid    Collection Time: 07/04/23  2:42 PM   Result Value Ref Range    Lactic Acid 2.5 (H) 0.5 - 2.0 mmol/L       No results for input(s): "COVID19" in the last 72 hours.    CT CHEST PULMONARY EMBOLISM W  CONTRAST    Result Date: 07/04/2023  EXAMINATION: CT CHEST PULMONARY EMBOLISM W CONTRAST 07/04/2023 2:35 PM ACCESSION NUMBER: [id number] COMPARISON: CTA chest June 29, 2023. INDICATION: shortness of breath TECHNIQUE: Multiple contiguous 2D axial CT images of the chest were obtained from the lung apices to the lung bases after the intravenous administration of Iso-vue 370 per pulmonary angiography protocol.  Coronal reconstructions were performed. Radiation dose reduction techniques were used for this study. Our CT scanners use one or all of the following: Automated exposure control, adjustment of the mA and/or kV according to patient size, iterative reconstruction. FINDINGS: No pulmonary embolus. No thoracic aortic aneurysm or dissection. Mild aortic plaque. Cardiomegaly. No pericardial effusion. Moderate coronary artery calcified plaque. Diffuse bronchitis with multifocal pneumonia, mild/moderate in the lower lobes and fairly minimal in the remaining lobes. The right lower lobe pneumonia has slightly worsened in the left lower lobe pneumonia has slightly improved from 5 days ago. Right upper lobe pneumonia has slightly worsened. Trace left pleural effusion. Right pleural effusion has resolved. Worsening bilateral lower lobe mucous plugging from most recent. Normal thyroid size. No adenopathy. No adenopathy. Imaging through the upper abdomen shows borderline spleen size. 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 Left adrenal nodule has not significantly changed from January 2024 PET/CT. Aortic plaque.     1.  No pulmonary embolus identified. No thoracic aortic aneurysm/dissection. 2.  Compared to 5 days ago, overall similar appearing basilar predominant pneumonia. Pneumonia has slightly worsened in the right lower lobe and right upper lobe. Pneumonia slightly improved in the left lower lobe. Worsening mucous plugging in the lower lobes raising possibility of aspiration  pneumonia/pneumonitis. Resolved right pleural effusion and unchanged trace left pleural effusion. Electronically signed by Elsie Stain    XR CHEST 1 VIEW    Result Date: 07/04/2023  TEMPORARY Unknown  INDICATION: PRIORS: 6 days prior FINDINGS:     The cardiac silhouette appears normal.  The pulmonary vascularity is     normal.  The mediastinum, great vessels, pleura, diaphragms, and bony     thorax are unremarkable for age.          Silhouetting of the left hemidiaphragm which may represent a left lower lobe pneumonia : On the prior chest CT., The patient had a left basilar airspace process, which was not well appreciated on today's study Electronically signed by Charyl Dancer        Signed:  [facility] [place], [place]    Part of this note may have been written by using a voice dictation software.  The note has been proof read but may still contain some grammatical/other typographical errors.

## 2023-07-04 NOTE — ACP (Advance Care Planning) (Signed)
Advance Care Planning     Advance Care Planning Activator (Inpatient)  Conversation Note      Date of ACP Conversation: 07/04/2023     Conversation Conducted with: Patient with Decision Making Capacity    ACP Activator: ComptonGwendolyn Fill, MSW        Health Care Decision Maker:     Current Designated Health Care Decision Maker:     Primary Decision Maker: Paul Medina [phone]  Click here to complete Healthcare Decision Makers including section of the Healthcare Decision Maker Relationship (ie "Primary")  Today we discussed patient preferences. Patient states that his son-in-law is his HCPOA and primary Management consultant.    Care Preferences    Patient states that he has HCPOA documents naming his son-in-law Paul Medina as his primary Management consultant.

## 2023-07-04 NOTE — Plan of Care (Signed)
Problem: Respiratory - Adult  Goal: Achieves optimal ventilation and oxygenation  Outcome: Progressing  Flowsheets (Taken 07/04/2023 2300)  Achieves optimal ventilation and oxygenation:   Assess for changes in respiratory status   Position to facilitate oxygenation and minimize respiratory effort   Initiate smoking cessation protocol as indicated   Assess the need for suctioning and aspirate as needed   Respiratory therapy support as indicated   Assess for changes in mentation and behavior   Oxygen supplementation based on oxygen saturation or arterial blood gases   Encourage broncho-pulmonary hygiene including cough, deep breathe, incentive spirometry

## 2023-07-04 NOTE — ED Provider Notes (Signed)
Emergency Department Provider Note       PCP: Unknown, Provider, ANP   Age: 89 y.o.   Sex: male     DISPOSITION                No diagnosis found.    Medical Decision Making     I will do a sepsis evaluation and get a chest x-ray.    Patient's D-dimer is elevated so I will get a CT scan of the chest to rule out PE.    His lactic acid is 3.7.  After fluids it is down to 2.5.  His white count is significantly elevated.  I will treat with Zosyn and Zyvox and discussed the case with the hospitalist for admission.     1 or more acute illnesses that pose a threat to life or bodily function.   Shared medical decision making was utilized in creating the patients health plan today.    I independently ordered and reviewed each unique test.       I interpreted the X-rays persistent infiltrate.  I interpreted the CT Scan worsening right sided infiltrate.    The patient was admitted and I have discussed patient management with the admitting provider.          History     Paul Medina presents with concerns about cough and shortness of breath that has persisted.  He was discharged from [facility] [place] yesterday for shortness of breath related to a COVID pneumonia.  He also may have had a pneumonia and was given some antibiotics.    He says he still feels fairly short of breath.    No other associated symptoms.    Elements of this note were created using speech recognition software.  As such, errors of speech recognition may be present.        ROS     Review of Systems   Constitutional:  Positive for appetite change and fatigue. Negative for chills.   HENT:  Negative for congestion and rhinorrhea.    Respiratory:  Positive for cough and shortness of breath.    Gastrointestinal:  Negative for nausea and vomiting.   Genitourinary:  Negative for dysuria and hematuria.   Neurological:  Negative for dizziness and light-headedness.   Psychiatric/Behavioral:  Negative for confusion.         Physical Exam     Vitals  signs and nursing note reviewed:  Vitals:    07/04/23 1245 07/04/23 1246 07/04/23 1255 07/04/23 1300   BP: 132/78   (!) 118/93   Pulse:  96 93 94   Resp: 16 16 16 21    Temp:       TempSrc:       SpO2: 94% 94% 93% 93%   Weight:       Height:          Physical Exam  Constitutional:       Appearance: He is well-developed.   Cardiovascular:      Rate and Rhythm: Normal rate.   Pulmonary:      Effort: Pulmonary effort is normal.      Comments: Scattered wheezing and rales  Abdominal:      General: Bowel sounds are normal.      Palpations: Abdomen is soft.   Musculoskeletal:      Right lower leg: No edema.      Left lower leg: No edema.   Neurological:      General: No focal deficit present.  Mental Status: He is alert.        Procedures     Procedures    Orders Placed This Encounter   Procedures    Culture, Blood 1    Culture, Blood 2    XR CHEST 1 VIEW    CT CHEST PULMONARY EMBOLISM W CONTRAST    CBC with Auto Differential    Comprehensive Metabolic Panel    Lactic Acid    Procalcitonin    D-Dimer, Quantitative    Lactic Acid    EKG 12 Lead    Saline lock IV        Medications given during this emergency department visit:  Medications   linezolid (ZYVOX) IVPB 600 mg (600 mg IntraVENous New Bag 07/04/23 1444)   piperacillin-tazobactam (ZOSYN) 4,500 mg in sodium chloride 0.9 % 100 mL IVPB (mini-bag) (4,500 mg IntraVENous New Bag 07/04/23 1328)   sodium chloride 0.9 % bolus 1,000 mL (1,000 mLs IntraVENous New Bag 07/04/23 1327)   iopamidol (ISOVUE-370) 76 % injection 100 mL (100 mLs IntraVENous Given 07/04/23 1424)       New Prescriptions    No medications on file        Past Medical History:   Diagnosis Date    Arthritis     Asthma     Chronic bronchitis (HCC)     Hypertension     Lung cancer (HCC)         No past surgical history on file.     Social History     Socioeconomic History    Marital status: Single   Tobacco Use    Smoking status: Former     Current packs/day: 0.00     Average packs/day: 1 pack/day for 34.0  years (34.0 ttl pk-yrs)     Types: Cigarettes     Start date: 05/31/1954     Quit date: 1990     Years since quitting: 35.1    Smokeless tobacco: Never   Substance and Sexual Activity    Alcohol use: Never    Drug use: Never    Sexual activity: Not Currently     Partners: Female     Social Determinants of Health     Financial Resource Strain: Low Risk  (10/21/2022)    Received from [facility]    Overall Financial Resource Strain (CARDIA)     Difficulty of Paying Living Expenses: Not very hard   Food Insecurity: No Food Insecurity (10/21/2022)    Received from [facility]    Hunger Vital Sign     Worried About Running Out of Food in the Last Year: Never true     Ran Out of Food in the Last Year: Never true   Transportation Needs: No Transportation Needs (10/21/2022)    Received from [facility] - Transportation     Lack of Transportation (Medical): No     Lack of Transportation (Non-Medical): No   Physical Activity: Unknown (10/21/2022)    Received from [facility]    Exercise Vital Sign     Days of Exercise per Week: 0 days   Stress: Stress Concern Present (10/21/2022)    Received from [facility] - Occupational Stress Questionnaire     Feeling of Stress : To some extent   Social Connections: Unknown (02/23/2023)    Received from [facility]    Social Connections     Frequency of Communication with Friends and  Family: Not asked     Frequency of Social Gatherings with Friends and Family: Not asked   Intimate Partner Violence: Unknown (02/23/2023)    Received from [facility]    Intimate Partner Violence     Fear of Current or Ex-Partner: Not asked     Emotionally Abused: Not asked     Physically Abused: Not asked     Sexually Abused: Not asked        Previous Medications    ALBUTEROL SULFATE HFA (PROVENTIL;VENTOLIN;PROAIR) 108 (90 BASE) MCG/ACT INHALER    Inhale 2 puffs into the lungs every 4 hours as needed for Shortness of Breath    APIXABAN (ELIQUIS) 2.5  MG TABS TABLET    Take 1 tablet by mouth 2 times daily    ASCORBIC ACID (VITAMIN C) 500 MG TABLET    Take 1 tablet by mouth daily    CARVEDILOL (COREG) 6.25 MG TABLET    Take 1 tablet by mouth 2 times daily (with meals)    CEFPODOXIME (VANTIN) 200 MG TABLET    Take 1 tablet by mouth 2 times daily for 5 days    EMPAGLIFLOZIN (JARDIANCE) 10 MG TABLET    Take 1 tablet by mouth daily    FUROSEMIDE (LASIX) 20 MG TABLET    Take 1 tablet by mouth daily    LACTOBACILLUS (CULTURELLE) CAPS CAPSULE    Take 1 capsule by mouth daily    LIDOCAINE (LIDODERM) 5 %    Place 1 patch onto the skin daily    LORATADINE (CLARITIN) 10 MG TABLET    Take 1 tablet by mouth daily    LOSARTAN (COZAAR) 25 MG TABLET    1 tablet daily    MULTIPLE VITAMIN (MULTIVITAMIN) TABLET    Take 1 tablet by mouth daily    PANTOPRAZOLE (PROTONIX) 40 MG TABLET    Take 1 tablet by mouth daily    PREDNISOLONE ACETATE (PRED FORTE) 1 % OPHTHALMIC SUSPENSION    1 drop 2 times daily    PREDNISONE (DELTASONE) 20 MG TABLET    Take 2 tablets by mouth daily for 5 doses    ROSUVASTATIN (CRESTOR) 10 MG TABLET    3 tablets daily    SILDENAFIL (VIAGRA) 100 MG TABLET    Take 1 tablet by mouth as needed    SODIUM BICARBONATE 650 MG TABLET    Take 1 tablet by mouth 4 times daily for 10 days    SPIRONOLACTONE (ALDACTONE) 25 MG TABLET    Take 1 tablet by mouth daily    TAMSULOSIN (FLOMAX) 0.4 MG CAPSULE    Take 1 capsule by mouth daily    TERAZOSIN (HYTRIN) 5 MG CAPSULE    2 capsules daily    TIOTROPIUM-OLODATEROL (STIOLTO) 2.5-2.5 MCG/ACT AERS    Inhale 2 puffs into the lungs daily        Results for orders placed or performed during the hospital encounter of 07/04/23   XR CHEST 1 VIEW    Narrative    TEMPORARY    Unknown     INDICATION:    PRIORS: 6 days prior  FINDINGS:       The cardiac silhouette appears normal.  The pulmonary vascularity is      normal.  The mediastinum, great vessels, pleura, diaphragms, and bony      thorax are unremarkable for age.       Impression         Silhouetting of the left hemidiaphragm which may represent a  left lower lobe  pneumonia    : On the prior chest CT., The patient had a left basilar airspace process, which  was not well appreciated on today's study    Electronically signed by Charyl Dancer   CT CHEST PULMONARY EMBOLISM W CONTRAST    Narrative    EXAMINATION: CT CHEST PULMONARY EMBOLISM W CONTRAST 07/04/2023 2:35 PM    ACCESSION NUMBER: [id number]    COMPARISON: CTA chest June 29, 2023.    INDICATION: shortness of breath    TECHNIQUE: Multiple contiguous 2D axial CT images of the chest were obtained  from the lung apices to the lung bases after the intravenous administration of  Iso-vue 370 per pulmonary angiography protocol.  Coronal reconstructions  were performed.    Radiation dose reduction techniques were used for this study. Our CT scanners  use one or all of the following: Automated exposure control, adjustment of the  mA and/or kV according to patient size, iterative reconstruction.    FINDINGS:  No pulmonary embolus. No thoracic aortic aneurysm or dissection. Mild aortic  plaque. Cardiomegaly. No pericardial effusion. Moderate coronary artery  calcified plaque. Diffuse bronchitis with multifocal pneumonia, mild/moderate in  the lower lobes and fairly minimal in the remaining lobes. The right lower lobe  pneumonia has slightly worsened in the left lower lobe pneumonia has slightly  improved from 5 days ago. Right upper lobe pneumonia has slightly worsened.  Trace left pleural effusion. Right pleural effusion has resolved. Worsening  bilateral lower lobe mucous plugging from most recent.    Normal thyroid size. No adenopathy. No adenopathy.    Imaging through the upper abdomen shows borderline spleen size. 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 Left adrenal  nodule has not significantly changed from January 2024 PET/CT. Aortic plaque.        Impression    1.  No pulmonary embolus identified.  No thoracic aortic aneurysm/dissection.  2.  Compared to 5 days ago, overall similar appearing basilar predominant  pneumonia. Pneumonia has slightly worsened in the right lower lobe and right  upper lobe. Pneumonia slightly improved in the left lower lobe. Worsening mucous  plugging in the lower lobes raising possibility of aspiration  pneumonia/pneumonitis. Resolved right pleural effusion and unchanged trace left  pleural effusion.      Electronically signed by Elsie Stain   CBC with Auto Differential   Result Value Ref Range    WBC 12.6 (H) 4.3 - 11.1 K/uL    RBC 3.80 (L) 4.23 - 5.6 M/uL    Hemoglobin 11.2 (L) Paul.6 - 17.2 g/dL    Hematocrit 57.8 (L) 41.1 - 50.3 %    MCV 85.8 82.0 - 102.0 FL    MCH 29.5 26.1 - 32.9 PG    MCHC 34.4 31.4 - 35.0 g/dL    RDW 46.9 62.9 - 52.8 %    Platelets 213 150 - 450 K/uL    MPV Paul.5 (H) 9.4 - 12.3 FL    nRBC 0.00 0.0 - 0.2 K/uL    Differential Type AUTOMATED      Neutrophils % 52.5 43.0 - 78.0 %    Lymphocytes % 23.1 Paul.0 - 44.0 %    Monocytes % 18.6 (H) 4.0 - 12.0 %    Eosinophils % 0.0 (L) 0.5 - 7.8 %    Basophils % 0.6 0.0 - 2.0 %    Immature Granulocytes % 5.2 (H) 0.0 - 5.0 %    Neutrophils  Absolute 6.62 1.70 - 8.20 K/UL    Lymphocytes Absolute 2.92 0.50 - 4.60 K/UL    Monocytes Absolute 2.35 (H) 0.10 - 1.30 K/UL    Eosinophils Absolute 0.00 0.00 - 0.80 K/UL    Basophils Absolute 0.08 0.00 - 0.20 K/UL    Immature Granulocytes Absolute 0.66 (H) 0.0 - 0.5 K/UL   Comprehensive Metabolic Panel   Result Value Ref Range    Sodium 143 136 - 145 mmol/L    Potassium 3.8 3.5 - 5.1 mmol/L    Chloride 105 98 - 107 mmol/L    CO2 20 20 - 29 mmol/L    Anion Gap 18 (H) 7 - 16 mmol/L    Glucose 117 (H) 70 - 99 mg/dL    BUN 52 (H) 8 - 23 MG/DL    Creatinine 1.61 (H) 0.80 - 1.30 MG/DL    Est, Glom Filt Rate 45 (L) >60 ml/min/1.31m2    Calcium 9.5 8.8 - 10.2 MG/DL    Total Bilirubin 0.6 0.0 - 1.2 MG/DL    ALT 43 8 - 55 U/L    AST 30 15 - 37 U/L    Alk Phosphatase 72 40 - 129 U/L    Total Protein  6.5 6.3 - 8.2 g/dL    Albumin 3.8 3.2 - 4.6 g/dL    Globulin 2.7 2.3 - 3.5 g/dL    Albumin/Globulin Ratio 1.4 1.0 - 1.9     Lactic Acid   Result Value Ref Range    Lactic Acid 3.7 (HH) 0.5 - 2.0 mmol/L   Procalcitonin   Result Value Ref Range    Procalcitonin 0.08 0.00 - 0.10 ng/mL   D-Dimer, Quantitative   Result Value Ref Range    D-Dimer, Quant 1.75 (H) <0.56 ug/ml(FEU)   Lactic Acid   Result Value Ref Range    Lactic Acid 2.5 (H) 0.5 - 2.0 mmol/L         CT CHEST PULMONARY EMBOLISM W CONTRAST   Final Result   1.  No pulmonary embolus identified. No thoracic aortic aneurysm/dissection.   2.  Compared to 5 days ago, overall similar appearing basilar predominant   pneumonia. Pneumonia has slightly worsened in the right lower lobe and right   upper lobe. Pneumonia slightly improved in the left lower lobe. Worsening mucous   plugging in the lower lobes raising possibility of aspiration   pneumonia/pneumonitis. Resolved right pleural effusion and unchanged trace left   pleural effusion.         Electronically signed by Elsie Stain      XR CHEST 1 VIEW   Final Result       Silhouetting of the left hemidiaphragm which may represent a left lower lobe   pneumonia      : On the prior chest CT., The patient had a left basilar airspace process, which   was not well appreciated on today's study      Electronically signed by Charyl Dancer                   No results for input(s): "COVID19" in the last 72 hours.    Voice dictation software was used during the making of this note.  This software is not perfect and grammatical and other typographical errors may be present.  This note has not been completely proofread for errors.        Emeline Darling, MD  07/04/23 [phone]

## 2023-07-05 ENCOUNTER — Inpatient Hospital Stay: Admit: 2023-07-05 | Payer: PRIVATE HEALTH INSURANCE | Primary: Diagnostic Radiology

## 2023-07-05 LAB — BASIC METABOLIC PANEL W/ REFLEX TO MG FOR LOW K
Anion Gap: 12 mmol/L (ref 7–16)
BUN: 45 mg/dL — ABNORMAL HIGH (ref 8–23)
CO2: 20 mmol/L (ref 20–29)
Calcium: 8.8 mg/dL (ref 8.8–10.2)
Chloride: 109 mmol/L — ABNORMAL HIGH (ref 98–107)
Creatinine: 1.38 mg/dL — ABNORMAL HIGH (ref 0.80–1.30)
Est, Glom Filt Rate: 48 mL/min/{1.73_m2} — ABNORMAL LOW (ref >60)
Glucose: 157 mg/dL — ABNORMAL HIGH (ref 70–99)
Potassium: 4.7 mmol/L (ref 3.5–5.1)
Sodium: 141 mmol/L (ref 136–145)

## 2023-07-05 LAB — CBC WITH AUTO DIFFERENTIAL
Basophils %: 0.7 % (ref 0.0–2.0)
Basophils Absolute: 0.06 10*3/uL (ref 0.00–0.20)
Eosinophils %: 0 % — ABNORMAL LOW (ref 0.5–7.8)
Eosinophils Absolute: 0 10*3/uL (ref 0.00–0.80)
Hematocrit: 28.6 % — ABNORMAL LOW (ref 41.1–50.3)
Hemoglobin: 9.6 g/dL — ABNORMAL LOW (ref 13.6–17.2)
Immature Granulocytes %: 7.6 % — ABNORMAL HIGH (ref 0.0–5.0)
Immature Granulocytes Absolute: 0.65 10*3/uL — ABNORMAL HIGH (ref 0.0–0.5)
Lymphocytes %: 4.8 % — ABNORMAL LOW (ref 13.0–44.0)
Lymphocytes Absolute: 0.41 10*3/uL — ABNORMAL LOW (ref 0.50–4.60)
MCH: 29.3 pg (ref 26.1–32.9)
MCHC: 33.6 g/dL (ref 31.4–35.0)
MCV: 87.2 fL (ref 82.0–102.0)
MPV: 13.6 fL — ABNORMAL HIGH (ref 9.4–12.3)
Monocytes %: 11 % (ref 4.0–12.0)
Monocytes Absolute: 0.95 10*3/uL (ref 0.10–1.30)
Neutrophils %: 75.9 % (ref 43.0–78.0)
Neutrophils Absolute: 6.53 10*3/uL (ref 1.70–8.20)
Platelet Comment: ADEQUATE
Platelets: 115 10*3/uL — ABNORMAL LOW (ref 150–450)
RBC: 3.28 M/uL — ABNORMAL LOW (ref 4.23–5.6)
RDW: 14.8 % — ABNORMAL HIGH (ref 11.9–14.6)
WBC: 8.6 10*3/uL (ref 4.3–11.1)
nRBC: 0 10*3/uL (ref 0.0–0.2)

## 2023-07-05 LAB — LACTIC ACID
Lactic Acid: 2.6 mmol/L — ABNORMAL HIGH (ref 0.5–2.0)
Lactic Acid: 2.2 mmol/L — ABNORMAL HIGH (ref 0.5–2.0)
Lactic Acid: 1 mmol/L (ref 0.5–2.0)

## 2023-07-05 LAB — EKG 12-LEAD
Atrial Rate: 99 {beats}/min
P Axis: 22 degrees
P-R Interval: 173 ms
Q-T Interval: 347 ms
QRS Duration: 101 ms
QTc Calculation (Bazett): 443 ms
R Axis: 30 degrees
T Axis: 95 degrees
Ventricular Rate: 98 {beats}/min

## 2023-07-05 LAB — URIC ACID: Uric Acid: 5.6 mg/dL (ref 3.9–8.2)

## 2023-07-05 LAB — MRSA/STAPH AUREUS DNA
MRSA by PCR: NOT DETECTED
SA by PCR: NOT DETECTED

## 2023-07-05 MED ORDER — APIXABAN 5 MG PO TABS
5 MG | Freq: Two times a day (BID) | ORAL | Status: DC
Start: 2023-07-05 — End: 2023-07-06
  Administered 2023-07-05 – 2023-07-06 (×2): 5 mg via ORAL

## 2023-07-05 MED ORDER — PREDNISOLONE ACETATE 1 % OP SUSP
1 % | OPHTHALMIC | Status: DC
Start: 2023-07-05 — End: 2023-07-13
  Administered 2023-07-05 – 2023-07-14 (×37): 1 [drp] via OPHTHALMIC

## 2023-07-05 MED FILL — PREDNISOLONE ACETATE 1 % OP SUSP: 1 % | OPHTHALMIC | Qty: 100

## 2023-07-05 MED FILL — ELIQUIS 5 MG PO TABS: 5 MG | ORAL | Qty: 1

## 2023-07-05 MED FILL — GUAIFENESIN ER 600 MG PO TB12: 600 MG | ORAL | Qty: 1

## 2023-07-05 MED FILL — ROSUVASTATIN CALCIUM 20 MG PO TABS: 20 MG | ORAL | Qty: 1

## 2023-07-05 MED FILL — PREDNISONE 20 MG PO TABS: 20 MG | ORAL | Qty: 2

## 2023-07-05 MED FILL — IPRATROPIUM-ALBUTEROL 0.5-2.5 (3) MG/3ML IN SOLN: 0.5-2.533 (3) MG/3ML | RESPIRATORY_TRACT | Qty: 3

## 2023-07-05 MED FILL — SODIUM BICARBONATE 650 MG PO TABS: 650 MG | ORAL | Qty: 1

## 2023-07-05 MED FILL — FUROSEMIDE 20 MG PO TABS: 20 MG | ORAL | Qty: 1

## 2023-07-05 MED FILL — ELIQUIS 2.5 MG PO TABS: 2.5 MG | ORAL | Qty: 1

## 2023-07-05 MED FILL — LINEZOLID 600 MG/300ML IV SOLN: 600300 MG/300ML | INTRAVENOUS | Qty: 300

## 2023-07-05 MED FILL — PANTOPRAZOLE SODIUM 40 MG PO TBEC: 40 MG | ORAL | Qty: 1

## 2023-07-05 MED FILL — PIPERACILLIN SOD-TAZOBACTAM SO 3.375 (3-0.375) G IV SOLR: 3.3753-0.375 (3-0.375) g | INTRAVENOUS | Qty: 3375

## 2023-07-05 MED FILL — CARVEDILOL 6.25 MG PO TABS: 6.25 MG | ORAL | Qty: 1

## 2023-07-05 MED FILL — DOXAZOSIN MESYLATE 4 MG PO TABS: 4 MG | ORAL | Qty: 1

## 2023-07-05 NOTE — Progress Notes (Signed)
ACUTE PHYSICAL THERAPY GOALS:   (Developed with and agreed upon by patient and/or caregiver.)      LTG:  (1.)Mr. Bonnin will move from supine to sit and sit to supine  in bed with INDEPENDENT within 7 treatment day(s).    (2.)Mr. Morton will transfer from bed to chair and chair to bed with SUPERVISION using the least restrictive device within 7 treatment day(s).    (3.)Mr. Demore will ambulate with SUPERVISION for 200 feet with the least restrictive device within 7 treatment day(s).  ________________________________________________________________________________________________      PHYSICAL THERAPY Initial Assessment  (Link to Caseload Tracking: PT Visit Days : 1  Acknowledge Orders  Time In/Out  PT Charge Capture  Rehab Caseload Tracker    Maurilio Puryear is a 89 y.o. male   PRIMARY DIAGNOSIS: Severe sepsis (HCC)  Severe sepsis (HCC) [A41.9, R65.20]  Pneumonia of right lung due to infectious organism, unspecified part of lung [J18.9]  Sepsis, due to unspecified organism, unspecified whether acute organ dysfunction present (HCC) [A41.9]       Reason for Referral: Generalized Muscle Weakness (M62.81)  Difficulty in walking, Not elsewhere classified (R26.2)  Inpatient: Payor: [employer] / Plan: [employer] / Product Type: *No Product type* /     ASSESSMENT:     REHAB RECOMMENDATIONS:   Recommendation to date pending progress:  Setting:  Continued physical therapy recommended at discharge.    Equipment:    None     ASSESSMENT:  Mr. Miklos admitted with above diagnoses.  He presented to the ER with complaints of cough and shortness of breath.  CT showed worsening R-sided pneumonia and worsening mucus plugging.  Patient was hospitalized at [facility] 2/5-2/10 with a-fib and pneumonia.  Short term rehab was recommended during last admission but patient declined and d/c'd home with HH orders.  Patient reports he was told he would have to be in a facility for 2 weeks for rehab and he did not want that.   Explained to patient that there was no way for [place] to know how long he would need to stay for rehab but he reports he still plans to return home with [facility].  Patient currently demonstrates decreased strength, balance, mobility, and tolerance for activity.  He would benefit from therapy to address these deficits.  He is on 3L O2 at this time but did not qualify for O2 2 days ago when he was d/c'd from [facility].  Patient educated on need for mobility/time out of bed to improve strength, mobility, and ability to safely return home.     Patient was ambulatory with ROLLING WALKER and CONTACT GUARD ASSIST at most.  Biggest obstacle is cough and weakness from prolonged illness.  X-ray present at end of session to image L great toe-patient reports it might be broken but doesn't know how it would have happened.  Patient easily able to flex/extend great toe and weight bear without complaints of pain.       [employer] AM-PACT "6 Clicks" Basic Mobility Inpatient Short Form  AM-PAC Basic Mobility - Inpatient   How much help is needed turning from your back to your side while in a flat bed without using bedrails?: A Little  How much help is needed moving from lying on your back to sitting on the side of a flat bed without using bedrails?: A Little  How much help is needed moving to and from a bed to a chair?: A Little  How much help is needed standing  up from a chair using your arms?: A Little  How much help is needed walking in hospital room?: A Little  How much help is needed climbing 3-5 steps with a railing?: A Little  AM-PAC Inpatient Mobility Raw Score : 18  AM-PAC Inpatient T-Scale Score : 43.63  Mobility Inpatient CMS 0-100% Score: 46.58  Mobility Inpatient CMS G-Code Modifier : CK    SUBJECTIVE:   Mr. Gonzalez states, "I'm really tired.  I haven't slept a lot because people keep coming in to stick me.".     Social/Functional Lives With: Alone  Type of Home: Senior housing apartment  Home Layout: One level  Home Access:  Level entry, Occupational psychologist Shower/Tub: Pension scheme manager: Midwife: Grab bars in shower, Grab bars around toilet  Bathroom Accessibility: Accessible  Home Equipment: Engineer, production Help From: Family  Prior Level of Assist for ADLs: Independent  Prior Level of Assist for [employer]: Independent  Prior Level of Assist for Transfers: Independent  Occupation: Retired    OBJECTIVE:     PAIN: VITALS / O2: PRECAUTION / LINES / DRAINS:   Pre Treatment: no complaints         Post Treatment: no complaints Vitals        Oxygen  O2 Device: Nasal cannula  O2 Flow Rate (L/min): 3 L/min   None    RESTRICTIONS/PRECAUTIONS:                    GROSS EVALUATION:  Intact Impaired (Comments):   AROM []   Generally decreased, functional    PROM []     Strength []   Generally decreased, functional - ambulatory with RW   Balance []  Sitting - Static: Good  Sitting - Dynamic: Fair, +  Standing - Static: Fair, +  Standing - Dynamic: Fair   Posture []  Forward Head  Rounded Shoulders  Thoracic Kyphosis   Sensation []      Coordination [x]       Tone []      Edema []     Activity Tolerance []   Generally decreased     []       COGNITION/  PERCEPTION: Intact Impaired (Comments):   Orientation [x]      Vision [x]      Hearing []   B hearing aids   Cognition  [x]        MOBILITY: I Mod I S SBA CGA Min Mod Max Total  NT x2 Comments:   Bed Mobility    Rolling []  []  []  []  []  []  []  []  []  []  []     Supine to Sit []  []  []  [x]  []  []  []  []  []  []  []     Scooting []  []  []  [x]  []  []  []  []  []  []  []     Sit to Supine []  []  []  []  []  []  []  []  []  []  []     Transfers    Sit to Stand []  []  []  []  [x]  []  []  []  []  []  []     Bed to Chair []  []  []  []  [x]  []  []  []  []  []  []     Stand to Sit []  []  []  []  [x]  []  []  []  []  []  []      []  []  []  []  []  []  []  []  []  []  []     I=Independent, Mod I=Modified Independent, S=Supervision, SBA=Standby Assistance, CGA=Contact Guard Assistance,   Min=Minimal Assistance, Mod=Moderate Assistance, Max=Maximal Assistance,  Total=Total Assistance, NT=Not Tested    GAIT: I Mod I S SBA CGA Min Mod Max  Total  NT x2 Comments:   Level of Assistance []  []  []  []  [x]  []  []  []  []  []  []     Distance 25 feet    DME Rolling Walker    Gait Quality Decreased cadence , Decreased step length, and Trunk flexion    Weightbearing Status      Stairs      I=Independent, Mod I=Modified Independent, S=Supervision, SBA=Standby Assistance, CGA=Contact Guard Assistance,   Min=Minimal Assistance, Mod=Moderate Assistance, Max=Maximal Assistance, Total=Total Assistance, NT=Not Tested    PLAN:   FREQUENCY AND DURATION: Daily for duration of hospital stay or until stated goals are met, whichever comes first.    THERAPY PROGNOSIS: Good    PROBLEM LIST:   (Skilled intervention is medically necessary to address:)  Decreased ADL/Functional Activities  Decreased Activity Tolerance  Decreased AROM/PROM  Decreased Balance  Decreased Gait Ability  Decreased Strength  Decreased Transfer Abilities INTERVENTIONS PLANNED:   (Benefits and precautions of physical therapy have been discussed with the patient.)  Therapeutic Activity  Therapeutic Exercise/HEP  Gait Training  Education       TREATMENT:   EVALUATION: LOW COMPLEXITY: (Untimed Charge)  The initial evaluation charge encompasses clinical chart review, objective assessment, interpretation of assessment, and skilled monitoring of the patient's response to treatment in order to develop a plan of care.     TREATMENT:   Therapeutic Activity (27 Minutes): Therapeutic activity included Supine to Sit, Scooting, Transfer Training, Ambulation on level ground, Sitting balance , and Standing balance to improve functional Activity tolerance, Balance, Mobility, Strength, and ROM.    TREATMENT GRID:  N/A    AFTER TREATMENT PRECAUTIONS: Bed/Chair Locked, Call light within reach, Chair, and Needs within reach    INTERDISCIPLINARY COLLABORATION:  RN/ PCT and OT/ COTA    EDUCATION: Education Given To: Patient  Education Provided: Role of  Therapy;Plan of Care  Education Method: Verbal  Education Outcome: Verbalized understanding  Educated patient and/or family/caregiver on the following: Home Safety    TIME IN/OUT:  Time In: 1122  Time Out: 1154  Minutes: 32    Georgeanna Harrison, PT

## 2023-07-05 NOTE — Progress Notes (Signed)
ACUTE OCCUPATIONAL THERAPY GOALS:   (Developed with and agreed upon by patient and/or caregiver.)  1. Patient will perform grooming with SPV and adaptive equipment as needed.  2. Patient will perform upper body dressing with SPV and adaptive equipment as needed.  3. Patient will perform lower body dressing with SPV and adaptive equipment as needed.  4. Patient will perform bathing with SPV and adaptive equipment as needed.  5. Patient will perform toileting and toilet transfer with SPV and adaptive equipment as needed.  6. Patient will perform ADL functional mobility and tranfers in room with SPV and adaptive equipment as needed.  7. Patient/family to demonstrate knowledge of home safety and DME recommendations.    Timeframe: 7 visits     OCCUPATIONAL THERAPY Initial Assessment, Daily Note, and AM       OT Visit Days: 1  Acknowledge Orders  Time  OT Charge Capture  Rehab Caseload Tracker      Paul Medina is a 89 y.o. male   PRIMARY DIAGNOSIS: Severe sepsis (HCC)  Severe sepsis (HCC) [A41.9, R65.20]  Pneumonia of right lung due to infectious organism, unspecified part of lung [J18.9]  Sepsis, due to unspecified organism, unspecified whether acute organ dysfunction present (HCC) [A41.9]       Reason for Referral: Generalized Muscle Weakness (M62.81)  Other lack of cordination (R27.8)  Difficulty in walking, Not elsewhere classified (R26.2)  Dizziness and Giddiness (R42)  Inpatient: Payor: [employer] / Plan: [employer] / Product Type: *No Product type* /     ASSESSMENT:     REHAB RECOMMENDATIONS:   Recommendation to date pending progress:  Setting:  Short-term Rehab  Continued occupational therapy recommended at discharge    Equipment:    None     ASSESSMENT:  [name] is admitted for the above diagnoses and presents with overall deficits in strength, functional mobility and ADL performance.  He lives alone in a senior living apartment community with level entry. At baseline (prior to recent  hospitalizations), he reports independence with ADLs and mobility (sometimes uses rollator). Today, he was able to perform bed mobility, household mobility, and grooming tasks seated in recliner with setup. Patient has been using urinal for toileting--donned new brief at EOB. He remains on 3L NC during session with episodes of wet/productive sounding cough. SpO2 97%. Presents with decreased activity tolerance impacting his ability to safely and independently perform ADLs. He was left in recliner chair with all needs met and in reach. Xray tech at bedside. Pt is functioning below their baseline and will benefit from skilled OT during hospital stay. Anticipate pt will benefit from continued therapy upon discharge.      [employer] AM-PACT "6 Clicks" Daily Activity Inpatient Short Form:    AM-PAC Daily Activity - Inpatient   How much help is needed for putting on and taking off regular lower body clothing?: A Lot  How much help is needed for bathing (which includes washing, rinsing, drying)?: A Lot  How much help is needed for toileting (which includes using toilet, bedpan, or urinal)?: A Little  How much help is needed for putting on and taking off regular upper body clothing?: None  How much help is needed for taking care of personal grooming?: None  How much help for eating meals?: None  AM-PAC Inpatient Daily Activity Raw Score: 19  AM-PAC Inpatient ADL T-Scale Score : 40.22  ADL Inpatient CMS 0-100% Score: 42.8  ADL Inpatient CMS G-Code Modifier : CK  SUBJECTIVE:     [name] states, "I get 3 different drops in my eyes, 5 times a day"     Social/Functional Lives With: Alone  Type of Home: Senior housing apartment  Home Layout: One level  Home Access: Level entry, Occupational psychologist Shower/Tub: Pension scheme manager: Midwife: Grab bars in shower, Grab bars around toilet  Bathroom Accessibility: Accessible  Home Equipment: Engineer, production Help From:  Family  Prior Level of Assist for ADLs: Independent  Prior Level of Assist for [employer]: Independent  Prior Level of Assist for Transfers: Independent  Occupation: Retired    OBJECTIVE:     LINES / DRAINS / AIRWAY: None    RESTRICTIONS/PRECAUTIONS:       PAIN: VITALS / O2:   Pre Treatment:      Reports great toe pain    Post Treatment: none       Vitals          Oxygen   3L NC         GROSS EVALUATION: INTACT IMPAIRED   (See Comments)   UE AROM [x]  []    UE PROM [x]  []    Strength []   Generalized weakness     Posture / Balance []   Fair using RW for functional mobility and transfers   Sensation [x]      Coordination [x]        Tone [x]        Edema []     Activity Tolerance []   Decreased from baseline     Hand Dominance R []  L []       COGNITION/  PERCEPTION: INTACT IMPAIRED   (See Comments)   Orientation [x]      Vision [x]      Hearing [x]      Cognition  []      Perception []        MOBILITY: I Mod I S SBA CGA Min Mod Max Total  NT x2 Comments:   Bed Mobility    Rolling []  []  []  []  []  []  []  []  []  []  []     Supine to Sit []  []  []  []  [x]  []  []  []  []  []  []     Scooting []  []  []  []  [x]  []  []  []  []  []  []     Sit to Supine []  []  []  []  []  []  []  []  []  [x]  []     Transfers    Sit to Stand []  []  []  []  [x]  []  []  []  []  []  []     Bed to Chair []  []  []  []  [x]  []  []  []  []  []  []  RW   Stand to Sit []  []  []  []  [x]  []  []  []  []  []  []     Tub/Shower []  []  []  []  []  []  []  []  []  []  []      Toilet []  []  []  []  []  []  []  []  []  []  []       []  []  []  []  []  []  []  []  []  []  []     I=Independent, Mod I=Modified Independent, S=Supervision/Setup, SBA=Standby Assistance, CGA=Contact Guard Assistance, Min=Minimal Assistance, Mod=Moderate Assistance, Max=Maximal Assistance, Total=Total Assistance, NT=Not Tested    ACTIVITIES OF DAILY LIVING: I Mod I S SBA CGA Min Mod Max Total NT Comments   BASIC ADLs:              Upper Body Bathing  []  []  []  []  []  []  []  []  []  []      Lower Body Bathing []  []  []  []  []  []  []  []  []  []   Toileting []  []  []  []  []  []  []  []  []  []     Upper  Body Dressing []  []  []  []  []  []  []  []  []  []     Lower Body Dressing []  []  []  []  []  []  []  [x]  []  []  To don socks, don new brief in standing   Feeding []  []  []  []  []  []  []  []  []  []     Grooming []  []  [x]  []  []  []  []  []  []  []  Seated in recliner chair   Personal Device Care []  []  []  []  []  []  []  []  []  []     Functional Mobility []  []  []  []  [x]  []  []  []  []  []  Mobility in room, additional time needed, RW   I=Independent, Mod I=Modified Independent, S=Supervision/Setup, SBA=Standby Assistance, CGA=Contact Guard Assistance, Min=Minimal Assistance, Mod=Moderate Assistance, Max=Maximal Assistance, Total=Total Assistance, NT=Not Tested    PLAN:   FREQUENCY/DURATION   OT Plan of Care: 3 times/week for duration of hospital stay or until stated goals are met, whichever comes first.    PROBLEM LIST:   (Skilled intervention is medically necessary to address:)  Decreased ADL/Functional Activities  Decreased Activity Tolerance  Decreased Balance  Decreased Gait Ability  Decreased Safety Awareness  Decreased Strength  Decreased Transfer Abilities  Increased Pain   INTERVENTIONS PLANNED:  (Benefits and precautions of occupational therapy have been discussed with the patient.)  Self Care Training  Therapeutic Activity  Therapeutic Exercise/HEP  Neuromuscular Re-education  Manual Therapy  Education         TREATMENT:     EVALUATION: LOW COMPLEXITY: (Untimed Charge)  The initial evaluation charge encompasses clinical chart review, objective assessment, interpretation of assessment, and skilled monitoring of the patient's response to treatment in order to develop a plan of care.     TREATMENT:   Self Care (24 minutes): Patient participated in lower body dressing, grooming, functional mobility, bed mobility, functional transfer, bathroom mobility, energy conservation, and assistive device in unsupported sitting and standing with minimal verbal, manual, and tactile cueing to increase independence, increase activity tolerance, and increase safety  awareness. The patient was educated on role of occupational therapy, compensatory technique during ADL , strategies to improve safety, proper use of assistive device, recommended equipment, transfer training and safety, and energy conservation techniques during ADL/IADLS and patient verbalized understanding and demonstrated understanding.     TREATMENT GRID:  N/A    AFTER TREATMENT PRECAUTIONS: Bed/Chair Locked, Call light within reach, Chair, Needs within reach, and RN notified    INTERDISCIPLINARY COLLABORATION:  RN/ PCT and PT/ PTA    EDUCATION:  Education Given To: Patient  Education Provided: Role of Therapy;Plan of Care;ADL Adaptive Strategies;Transfer Training;Energy Conservation;Equipment;Fall Prevention Strategies;Mobility Training  Education Method: Demonstration;Verbal  Barriers to Learning: None  Education Outcome: Verbalized understanding;Demonstrated understanding;Continued education needed    TOTAL TREATMENT DURATION AND TIME:  Time In: 1122  Time Out: 1152  Minutes: [address] [place]

## 2023-07-05 NOTE — Progress Notes (Signed)
Hospitalist Progress Note   Admit Date:  07/04/2023 11:46 AM   Name:  Paul Medina   Age:  89 y.o.  Sex:  male  DOB:  01/09/1931   MRN:  [id number]   Room:  357/01    Presenting/Chief Complaint: Shortness of Breath     Reason(s) for Admission: Severe sepsis (HCC) [A41.9, R65.20]  Pneumonia of right lung due to infectious organism, unspecified part of lung [J18.9]  Sepsis, due to unspecified organism, unspecified whether acute organ dysfunction present [facility]) [A41.9]     Hospital Course:   Paul Medina is a 89 y.o. male with medical history of A-fib, CHF with reduced ejection fraction, hypertension, hyperlipidemia, CAD, TIA, left eye blindness, OSA, CKD stage III, asthma who presented with worsening cough and shortness of breath.  Of note patient was recently hospitalized at [facility] [place] and discharged yesterday.  Patient was hospitalized during that visit for A-fib with RVR and community-acquired pneumonia, asthma exacerbation, and COVID-19 pneumonia.  Patient was discharged home to his studio apartment where he lives alone but has children in the area.  He states that his shortness of breath got worse and he was not eating or drinking.  During last hospitalization patient was evaluated by speech therapy as well as GI for dysphagia.  No EGD was recommended by GI at that time.  Speech therapy recommended minced and moist diet with thin liquids.  Patient states that he is still been having dysphagia and occasionally chokes on food/liquids.     On arrival to the ED notable labs include lactic acid 3.7 down trended to 0.5, Pro-Cal 0.08, WBCs 12 (3 at discharge on 2/10), creatinine 1.46 (near baseline).  CT chest PE protocol shows no PE but does show worsening right-sided pneumonia with mild improvement of left lung from previous exam, worsening mucous plugging in lower lobes raising concern for aspiration pneumonia/pneumonitis.  Patient started on Zyvox and Zosyn in ED, stop Zyvox on 2/12 as MRSA  swab negative.  Blood cultures no growth to date.  Speech therapy recommended regular consistency and thin liquids.      Subjective & 24hr Events:   Evaluated at bedside.  Only complaint is left great toe pain.  Continues to have wet cough with some mucus production.  On nasal cannula oxygen.      Assessment & Plan:     Severe sepsis:  -As evidenced by tachypnea, leukocytosis, lactic acid 3.7 in setting of hospital-acquired pneumonia  -Started on Zyvox and Zosyn, stop Zyvox on 2/12 as MRSA nares negative  -Blood cultures no growth to date  -UA unremarkable     Hospital-acquired pneumonia:  -Recent hospital admission for A-fib with RVR, community-acquired pneumonia, asthma exacerbation, and COVID-19  -Concern for aspiration pneumonia, speech therapy evaluated and diet ordered  -Chest CT PE protocol: No PE, pneumonia (right greater than left), mucous plugging with concern for aspiration  -Started on Zyvox and Zosyn, stop Zyvox on 2/12 as MRSA nares negative  -Mucinex twice daily  -Blood cultures no growth to date     Atrial fibrillation, not in RVR:  -Recent hospital admission with RVR  -Continue home Eliquis, Coreg     Heart failure with reduced ejection fraction:  -Echocardiogram 06/29/2023: EF 30-35%, grade 2 diastolic dysfunction  -Seen by cardiology at last hospital admission  -Continue home Coreg  -Holding home losartan, SGLT2, Aldactone given severe sepsis, will restart when able  -Will need repeat outpatient echocardiogram in 3 months    Left great toe pain:  -  Left foot x-ray shows no fractures, hammertoe deformities  -Low suspicion for gout given exam, will monitor     History of TIA:  -Continue home aspirin 81 mg daily     History of B-cell lymphoma:  -Continue outpatient follow-up with Dr. Rulon Abide at [facility] following discharge     Obstructive sleep apnea:  -Respiratory consult for CPAP while inpatient     CAD:  -Continue home aspirin, Coreg  -Will restart home losartan, SGLT2, and Aldactone when able, held in  setting of severe sepsis     Hypertension:  -Holding antihypertensives in setting of severe sepsis     Hyperlipidemia:  -Continue home Crestor     BPH:  -Continue home Cardura      Anticipated Discharge Arrangements:   STR    PT/OT evals ordered?  Therapy evals ordered  Diet:  ADULT DIET; Regular  VTE prophylaxis: Already on anticoagulation  Code status: DNR      Non-peripheral Lines and Tubes (if present):          Telemetry (if present):  Cardiac/Telemetry Monitor On: No        Hospital Problems:  Principal Problem:    Severe sepsis (HCC)  Active Problems:    Chronic heart failure with mildly reduced ejection fraction (HFmrEF, 41-49%) (HCC)    HTN (hypertension)    HLD (hyperlipidemia)    Coronary atherosclerosis of native coronary artery    TIA (transient ischemic attack)    OSA (obstructive sleep apnea)    Stage 3 chronic kidney disease (HCC)    Atrial fibrillation (HCC)    Hospital-acquired pneumonia  Resolved Problems:    * No resolved hospital problems. *      Objective:   Patient Vitals for the past 24 hrs:   Temp Pulse Resp BP SpO2   07/05/23 1158 -- 76 16 -- 95 %   07/05/23 0759 -- 78 16 -- 96 %   07/05/23 0738 97.7 F (36.5 C) 72 16 131/85 97 %   07/04/23 2254 -- 78 18 -- 95 %   07/04/23 1916 98.1 F (36.7 C) 72 18 124/80 94 %   07/04/23 1827 98.1 F (36.7 C) 84 22 (!) 152/79 91 %   07/04/23 1730 -- -- -- 131/75 94 %   07/04/23 1715 -- -- -- 134/79 94 %   07/04/23 1700 -- -- -- (!) 146/75 96 %   07/04/23 1655 -- -- -- -- 96 %   07/04/23 1652 -- -- -- (!) 147/85 95 %   07/04/23 1647 -- -- -- -- 96 %   07/04/23 1515 -- -- -- 132/74 95 %   07/04/23 1500 -- -- -- (!) 145/75 95 %       Oxygen Therapy  SpO2: 95 %  Pulse via Oximetry: 83 beats per minute  Pulse Oximeter Device Mode: Intermittent  O2 Device: Nasal cannula  O2 Flow Rate (L/min): 3 L/min    Estimated body mass index is 29.05 kg/m as calculated from the following:    Height as of this encounter: 1.676 m (5\' 6" ).    Weight as of this encounter:  81.6 kg (180 lb).    Intake/Output Summary (Last 24 hours) at 07/05/2023 1453  Last data filed at 07/04/2023 2147  Gross per 24 hour   Intake --   Output 150 ml   Net -150 ml         Physical Exam:   General:          Elderly,  resting in bed  Head:               Normocephalic, atraumatic  Eyes:               Sclerae appear normal.   ENT:                Nares appear normal.  Moist oral mucosa  Neck:               No restricted ROM.  Trachea midline   CV:                  RRR.  No m/r/g.   Lungs:             Rales noted bilaterally.  Symmetric expansion.  Wet cough noted, nasal cannula oxygen  Abdomen:        Soft, nontender, nondistended.  Extremities:     No cyanosis or clubbing.  No edema, left great toe mildly erythematous at base of nail and tender to palpation  Skin:                No rashes.  Normal coloration.   Warm and dry.    Neuro:             CN II-XII grossly intact.  Moves all extremities  Psych:             Normal mood and affect.       I have personally reviewed labs and tests:  Recent Labs:  Recent Results (from the past 48 hour(s))   CBC with Auto Differential    Collection Time: 07/04/23 12:18 PM   Result Value Ref Range    WBC 12.6 (H) 4.3 - 11.1 K/uL    RBC 3.80 (L) 4.23 - 5.6 M/uL    Hemoglobin 11.2 (L) 13.6 - 17.2 g/dL    Hematocrit 36.6 (L) 41.1 - 50.3 %    MCV 85.8 82.0 - 102.0 FL    MCH 29.5 26.1 - 32.9 PG    MCHC 34.4 31.4 - 35.0 g/dL    RDW 44.0 34.7 - 42.5 %    Platelets 213 150 - 450 K/uL    MPV 13.5 (H) 9.4 - 12.3 FL    nRBC 0.00 0.0 - 0.2 K/uL    Differential Type AUTOMATED      Neutrophils % 52.5 43.0 - 78.0 %    Lymphocytes % 23.1 13.0 - 44.0 %    Monocytes % 18.6 (H) 4.0 - 12.0 %    Eosinophils % 0.0 (L) 0.5 - 7.8 %    Basophils % 0.6 0.0 - 2.0 %    Immature Granulocytes % 5.2 (H) 0.0 - 5.0 %    Neutrophils Absolute 6.62 1.70 - 8.20 K/UL    Lymphocytes Absolute 2.92 0.50 - 4.60 K/UL    Monocytes Absolute 2.35 (H) 0.10 - 1.30 K/UL    Eosinophils Absolute 0.00 0.00 - 0.80 K/UL     Basophils Absolute 0.08 0.00 - 0.20 K/UL    Immature Granulocytes Absolute 0.66 (H) 0.0 - 0.5 K/UL   Comprehensive Metabolic Panel    Collection Time: 07/04/23 12:18 PM   Result Value Ref Range    Sodium 143 136 - 145 mmol/L    Potassium 3.8 3.5 - 5.1 mmol/L    Chloride 105 98 - 107 mmol/L    CO2 20 20 - 29 mmol/L    Anion Gap 18 (H) 7 - 16 mmol/L    Glucose  117 (H) 70 - 99 mg/dL    BUN 52 (H) 8 - 23 MG/DL    Creatinine 6.04 (H) 0.80 - 1.30 MG/DL    Est, Glom Filt Rate 45 (L) >60 ml/min/1.53m2    Calcium 9.5 8.8 - 10.2 MG/DL    Total Bilirubin 0.6 0.0 - 1.2 MG/DL    ALT 43 8 - 55 U/L    AST 30 15 - 37 U/L    Alk Phosphatase 72 40 - 129 U/L    Total Protein 6.5 6.3 - 8.2 g/dL    Albumin 3.8 3.2 - 4.6 g/dL    Globulin 2.7 2.3 - 3.5 g/dL    Albumin/Globulin Ratio 1.4 1.0 - 1.9     Lactic Acid    Collection Time: 07/04/23 12:18 PM   Result Value Ref Range    Lactic Acid 3.7 (HH) 0.5 - 2.0 mmol/L   Procalcitonin    Collection Time: 07/04/23 12:18 PM   Result Value Ref Range    Procalcitonin 0.08 0.00 - 0.10 ng/mL   D-Dimer, Quantitative    Collection Time: 07/04/23 12:18 PM   Result Value Ref Range    D-Dimer, Quant 1.75 (H) <0.56 ug/ml(FEU)   EKG 12 Lead    Collection Time: 07/04/23 12:24 PM   Result Value Ref Range    Ventricular Rate 98 BPM    Atrial Rate 99 BPM    P-R Interval 173 ms    QRS Duration 101 ms    Q-T Interval 347 ms    QTc Calculation (Bazett) 443 ms    P Axis 22 degrees    R Axis 30 degrees    T Axis 95 degrees    Diagnosis       Sinus rhythm  Abnormal inferior Q waves  Borderline repolarization abnormality    Confirmed by BITTRICK  MD (UC), Karie Kirks [phone]) on 07/05/2023 7:00:22 AM     Culture, Blood 1    Collection Time: 07/04/23  1:09 PM    Specimen: Blood   Result Value Ref Range    Special Requests RIGHT  Antecubital        Culture NO GROWTH AFTER 19 HOURS     Culture, Blood 2    Collection Time: 07/04/23  1:09 PM    Specimen: Blood   Result Value Ref Range    Special Requests LEFT  FOREARM         Culture NO GROWTH AFTER 19 HOURS     Lactic Acid    Collection Time: 07/04/23  2:42 PM   Result Value Ref Range    Lactic Acid 2.5 (H) 0.5 - 2.0 mmol/L   Lactic Acid    Collection Time: 07/04/23  7:20 PM   Result Value Ref Range    Lactic Acid 2.6 (H) 0.5 - 2.0 mmol/L   MRSA/Staph Aureus DNA    Collection Time: 07/04/23 11:16 PM    Specimen: Nasal Swab   Result Value Ref Range    MRSA by PCR Not detected NOTD      SA by PCR Not detected NOTD     Lactic Acid    Collection Time: 07/04/23 11:32 PM   Result Value Ref Range    Lactic Acid 2.2 (H) 0.5 - 2.0 mmol/L   CBC with Auto Differential    Collection Time: 07/05/23  3:26 AM   Result Value Ref Range    WBC 8.6 4.3 - 11.1 K/uL    RBC 3.28 (L) 4.23 - 5.6 M/uL  Hemoglobin 9.6 (L) 13.6 - 17.2 g/dL    Hematocrit 16.1 (L) 41.1 - 50.3 %    MCV 87.2 82.0 - 102.0 FL    MCH 29.3 26.1 - 32.9 PG    MCHC 33.6 31.4 - 35.0 g/dL    RDW 09.6 (H) 04.5 - 14.6 %    Platelets 115 (L) 150 - 450 K/uL    MPV 13.6 (H) 9.4 - 12.3 FL    nRBC 0.00 0.0 - 0.2 K/uL    Neutrophils % 75.9 43.0 - 78.0 %    Lymphocytes % 4.8 (L) 13.0 - 44.0 %    Monocytes % 11.0 4.0 - 12.0 %    Eosinophils % 0.0 (L) 0.5 - 7.8 %    Basophils % 0.7 0.0 - 2.0 %    Immature Granulocytes % 7.6 (H) 0.0 - 5.0 %    Neutrophils Absolute 6.53 1.70 - 8.20 K/UL    Lymphocytes Absolute 0.41 (L) 0.50 - 4.60 K/UL    Monocytes Absolute 0.95 0.10 - 1.30 K/UL    Eosinophils Absolute 0.00 0.00 - 0.80 K/UL    Basophils Absolute 0.06 0.00 - 0.20 K/UL    Immature Granulocytes Absolute 0.65 (H) 0.0 - 0.5 K/UL    RBC Comment MODERATE  ANISOCYTOSIS        RBC Comment MODERATE  POIKILOCYTOSIS        WBC Comment Result Confirmed By Smear      Platelet Comment ADEQUATE      Differential Type MANUAL     Basic Metabolic Panel w/ Reflex to MG    Collection Time: 07/05/23  3:26 AM   Result Value Ref Range    Sodium 141 136 - 145 mmol/L    Potassium 4.7 3.5 - 5.1 mmol/L    Chloride 109 (H) 98 - 107 mmol/L    CO2 20 20 - 29 mmol/L    Anion Gap 12 7  - 16 mmol/L    Glucose 157 (H) 70 - 99 mg/dL    BUN 45 (H) 8 - 23 MG/DL    Creatinine 4.09 (H) 0.80 - 1.30 MG/DL    Est, Glom Filt Rate 48 (L) >60 ml/min/1.40m2    Calcium 8.8 8.8 - 10.2 MG/DL   Lactic Acid    Collection Time: 07/05/23  3:26 AM   Result Value Ref Range    Lactic Acid 1.0 0.5 - 2.0 mmol/L   Uric Acid    Collection Time: 07/05/23  3:26 AM   Result Value Ref Range    Uric Acid 5.6 3.9 - 8.2 MG/DL       No results for input(s): "COVID19" in the last 72 hours.    Current Meds:  Current Facility-Administered Medications   Medication Dose Route Frequency    apixaban (ELIQUIS) tablet 5 mg  5 mg Oral 2 times per day    prednisoLONE acetate (PRED FORTE) 1 % ophthalmic suspension 1 drop  1 drop Left Eye 6 times per day    sodium chloride flush 0.9 % injection 5-40 mL  5-40 mL IntraVENous 2 times per day    sodium chloride flush 0.9 % injection 5-40 mL  5-40 mL IntraVENous PRN    0.9 % sodium chloride infusion   IntraVENous PRN    potassium chloride (KLOR-CON M) extended release tablet 40 mEq  40 mEq Oral PRN    Or    potassium bicarb-citric acid (EFFER-K) effervescent tablet 40 mEq  40 mEq Oral PRN    Or  potassium chloride 10 mEq/100 mL IVPB (Peripheral Line)  10 mEq IntraVENous PRN    magnesium sulfate 2000 mg in 50 mL IVPB premix  2,000 mg IntraVENous PRN    ondansetron (ZOFRAN-ODT) disintegrating tablet 4 mg  4 mg Oral Q8H PRN    Or    ondansetron (ZOFRAN) injection 4 mg  4 mg IntraVENous Q6H PRN    polyethylene glycol (GLYCOLAX) packet 17 g  17 g Oral Daily PRN    acetaminophen (TYLENOL) tablet 650 mg  650 mg Oral Q6H PRN    Or    acetaminophen (TYLENOL) suppository 650 mg  650 mg Rectal Q6H PRN    linezolid (ZYVOX) IVPB 600 mg  600 mg IntraVENous Q12H    piperacillin-tazobactam (ZOSYN) 3,375 mg in sodium chloride 0.9 % 50 mL IVPB (mini-bag)  3,375 mg IntraVENous Q8H    guaiFENesin (MUCINEX) extended release tablet 600 mg  600 mg Oral BID    ipratropium 0.5 mg-albuterol 2.5 mg (DUONEB) nebulizer  solution 1 Dose  1 Dose Inhalation Q4H WA RT    carvedilol (COREG) tablet 6.25 mg  6.25 mg Oral BID WC    rosuvastatin (CRESTOR) tablet 20 mg  20 mg Oral Nightly    sodium bicarbonate tablet 650 mg  650 mg Oral 4x Daily    predniSONE (DELTASONE) tablet 40 mg  40 mg Oral Daily    doxazosin (CARDURA) tablet 4 mg  4 mg Oral Daily    pantoprazole (PROTONIX) tablet 40 mg  40 mg Oral QAM AC    furosemide (LASIX) tablet 20 mg  20 mg Oral Daily       Signed:  [facility] [place], [place]    Part of this note may have been written by using a voice dictation software.  The note has been proof read but may still contain some grammatical/other typographical errors.

## 2023-07-05 NOTE — Plan of Care (Signed)
Problem: Discharge Planning  Goal: Discharge to home or other facility with appropriate resources  Outcome: Progressing     Problem: Safety - Adult  Goal: Free from fall injury  Outcome: Progressing     Problem: ABCDS Injury Assessment  Goal: Absence of physical injury  Outcome: Progressing     Problem: Respiratory - Adult  Goal: Achieves optimal ventilation and oxygenation  Outcome: Progressing

## 2023-07-05 NOTE — Plan of Care (Signed)
Problem: Discharge Planning  Goal: Discharge to home or other facility with appropriate resources  Outcome: Progressing     Problem: Safety - Adult  Goal: Free from fall injury  Outcome: Progressing     Problem: Respiratory - Adult  Goal: Achieves optimal ventilation and oxygenation  07/05/2023 0223 by Anjannette Gauger, [place], RN  Outcome: Progressing  07/04/2023 2300 by Cecile Hearing, RCP  Outcome: Progressing  Flowsheets (Taken 07/04/2023 2300)  Achieves optimal ventilation and oxygenation:   Assess for changes in respiratory status   Position to facilitate oxygenation and minimize respiratory effort   Initiate smoking cessation protocol as indicated   Assess the need for suctioning and aspirate as needed   Respiratory therapy support as indicated   Assess for changes in mentation and behavior   Oxygen supplementation based on oxygen saturation or arterial blood gases   Encourage broncho-pulmonary hygiene including cough, deep breathe, incentive spirometry

## 2023-07-05 NOTE — Progress Notes (Signed)
GOALS:  LTG: Patient will maintain adequate hydration/nutrition with optimum safety and efficiency of swallowing function with PO intake without overt signs or symptoms of aspiration for the highest appropriate diet level.  STG:  Patient will consume regular textures and thin liquids without overt signs or symptoms of airway compromise.  Patient will safely ingest diet trials during therapeutic feedings with SLP without overt signs or symptoms of respiratory compromise in efforts to advance diet.  Patient will complete a Modified Barium Swallow study to fully assess physiology and anatomy of the swallow and determine safest appropriate diet and/or rehabilitation strategies, as medically indicated.    SPEECH LANGUAGE PATHOLOGY: DYSPHAGIA Initial Assessment    Acknowledge Order  I  Therapy Time  I   Charges     I  Rehab Caseload Tracker      NAME: Paul Medina  DOB: 1930-12-30  MRN: [id number]    ADMISSION DATE: 07/04/2023  PRIMARY DIAGNOSIS: Severe sepsis (HCC)    ICD-10: Treatment Diagnosis: R13.14 Dysphagia, Pharyngoesophageal Phase    RECOMMENDATIONS   Diet:    Regular Consistency  Thin Liquids    Medication: as tolerated   Compensatory Swallowing Strategies:   Alternate solids and liquids  Slow rate of intake  Remain upright for 30-45 minutes after meals  Small bites/sips  Upright as possible for all oral intake   Therapeutic Intervention:   Patient/family education  Dysphagia treatment   Patient continues to require skilled intervention:  Yes. Recommend ongoing speech therapy services during this hospitalization.     Anticipated Discharge Needs:  Additional speech therapy at next level of care is to be determined.      ASSESSMENT    Patient presents with normal oropharyngeal swallow characterized by normal oral phase and timely swallow initiation of all textures with no overt signs and symptoms of aspiration observed. However, patient reports that food often gets stuck in his throat and occasionally comes back  up. In the last admission, patient was seen by GI who diagnosed presbyesophagus and recommended minced and moist diet and signed off.   Upon arrival to the ED, chest xray reveals "worsening mucous plugging in lower lobes raising concern for aspiration pneumonia/pneumonitis." Will order a swallow study to rule out silent aspiration. Patient reports he uses compensatory strategies (alternating bites/sips, small bites, avoids dry meats) but would prefer to be on a regular diet.     Recommend Regular textures/ Thin liquids    GENERAL    Subjective: Patient was encountered sitting up in  bed. Pleasant and cooperative. Alert and oriented.     Reason for Consult:  Patient recently discharged home from [facility] [place] presented back to the hospital with worsened shortness of breath    History of Present Injury/Illness: Paul Medina  has a past medical history of Arthritis, Asthma, Chronic bronchitis (HCC), Hypertension, and Lung cancer (HCC).. He also  has no past surgical history on file.  Precautions/Allergies: Ciprofloxacin, Doxycycline, Lisinopril, Other, Sulfa antibiotics, Sulfasalazine, and Sulfur     Observations:  Alertness: Alert  Voice: WFL  Speech: Intelligible  Expressive Language: Fluent and Able to communicate wants and needs  Receptive Language: Answers yes/no questions, Follows basic commands, and Appropriately responds to questions  Cognition: Able to recall recent events and medical history and Appropriately attends to clinician    Prior Dysphagia History: Patient was seen by ST in recent hospitalization and recommended for minced and moist diet with thin liquids with recommendation to allow patient to upgrade to soft and bite sized  upon request.   Prior Instrumental Assessment: none reported    Current Diet:  NPO      Respiratory Status: Nasal Cannula    Pain:  Patient does not c/o pain  Pain intervention: None- No pain observed  Pain response: Patient satisfied    OBJECTIVE    Oral Motor  Assessment: Labial: no impairment  Lingual: no impairment  Dentition: adequate  Oral Hygiene: dry  Vocal quality: WFL  Volitional cough: adequate  Volitional swallow: adequate  Oropharyngeal Assessment: Patient presented with ice chips, ice water, puree, soft and course solids.  Appropriate oral prep with all textures. Timely swallow initiation, and single swallows upon palpation. Adequate oral clearing. No overt signs or symptoms of airway compromise observed with liquid or solid textures. Patient reports he occasionally has food come back up and that he has had his esophagus stretched in the past. Patient did not report any globus sensation or sensation of reflux during this assessment.     Dysphagia Outcome and Severity Scale (DOSS)  Score: 6 Description   []  7 Normal in all situations   [x]  6 Within Functional Limits/ Modified independent   []  5 Mild Dysphagia: Distant Supervision. May need one diet consistency      restricted.   []  4 Mild-Moderate Dysphagia: Intermittent supervision/cuing. One-two diet    consistencies restricted.   []  3 Moderate Dysphagia: Total assistance, supervision, or strategies.       Two or more diet consistencies restricted.   []  2 Moderate-Severe Dysphagia: Maximum assistance or maximum use     of strategies with partial po intake   []  1 Severe dysphagia- NPO. Unable to tolerate any po safely     PLAN    Duration/Frequency: Continue to follow patient 2x/week for duration of hospitalization and/or until goals met    Rehabilitation Potential For Stated Goals: Excellent    Interdisciplinary Collaboration: RN/ PCT    Medical Necessity    Skilled intervention continues to be required due to patient needs swallow study to rule out possible silent aspiration due to lower lobe pneumonia.    Education:   Patient and/or family/caregiver educated on Results of evaluation, Role of speech therapy, Diet recommendations, SLP recommendations, and SLP plan  Education response: Verbalizes understanding  and Demonstrates understanding    Safety:   Call light within reach  In Bed  RN notified     Therapy Time:  Time In: 1010  Time Out: 1043  Minutes: 33    Aliyha Fornes, SLP  07/05/2023 10:46 AM

## 2023-07-06 ENCOUNTER — Inpatient Hospital Stay: Admit: 2023-07-06 | Payer: PRIVATE HEALTH INSURANCE | Primary: Diagnostic Radiology

## 2023-07-06 LAB — CBC WITH AUTO DIFFERENTIAL
Basophils %: 0.4 % (ref 0.0–2.0)
Basophils Absolute: 0.04 10*3/uL (ref 0.00–0.20)
Eosinophils %: 0 % — ABNORMAL LOW (ref 0.5–7.8)
Eosinophils Absolute: 0 10*3/uL (ref 0.00–0.80)
Hematocrit: 28.9 % — ABNORMAL LOW (ref 41.1–50.3)
Hemoglobin: 9.7 g/dL — ABNORMAL LOW (ref 13.6–17.2)
Immature Granulocytes %: 7.6 % — ABNORMAL HIGH (ref 0.0–5.0)
Immature Granulocytes Absolute: 0.83 10*3/uL — ABNORMAL HIGH (ref 0.0–0.5)
Lymphocytes %: 4.4 % — ABNORMAL LOW (ref 13.0–44.0)
Lymphocytes Absolute: 0.48 10*3/uL — ABNORMAL LOW (ref 0.50–4.60)
MCH: 29 pg (ref 26.1–32.9)
MCHC: 33.6 g/dL (ref 31.4–35.0)
MCV: 86.5 fL (ref 82.0–102.0)
MPV: 12.5 fL — ABNORMAL HIGH (ref 9.4–12.3)
Monocytes %: 19.1 % — ABNORMAL HIGH (ref 4.0–12.0)
Monocytes Absolute: 2.08 10*3/uL — ABNORMAL HIGH (ref 0.10–1.30)
Neutrophils %: 68.5 % (ref 43.0–78.0)
Neutrophils Absolute: 7.47 10*3/uL (ref 1.70–8.20)
Platelets: 135 10*3/uL — ABNORMAL LOW (ref 150–450)
RBC: 3.34 M/uL — ABNORMAL LOW (ref 4.23–5.6)
RDW: 14.7 % — ABNORMAL HIGH (ref 11.9–14.6)
WBC: 10.9 10*3/uL (ref 4.3–11.1)
nRBC: 0 10*3/uL (ref 0.0–0.2)

## 2023-07-06 LAB — BASIC METABOLIC PANEL W/ REFLEX TO MG FOR LOW K
Anion Gap: 14 mmol/L (ref 7–16)
BUN: 45 mg/dL — ABNORMAL HIGH (ref 8–23)
CO2: 22 mmol/L (ref 20–29)
Calcium: 8.9 mg/dL (ref 8.8–10.2)
Chloride: 105 mmol/L (ref 98–107)
Creatinine: 1.68 mg/dL — ABNORMAL HIGH (ref 0.80–1.30)
Est, Glom Filt Rate: 38 mL/min/{1.73_m2} — ABNORMAL LOW (ref >60)
Glucose: 115 mg/dL — ABNORMAL HIGH (ref 70–99)
Potassium: 4.7 mmol/L (ref 3.5–5.1)
Sodium: 141 mmol/L (ref 136–145)

## 2023-07-06 MED ORDER — ROSUVASTATIN CALCIUM 5 MG PO TABS
5 MG | Freq: Every evening | ORAL | Status: DC
Start: 2023-07-06 — End: 2023-07-21
  Administered 2023-07-07 – 2023-07-21 (×15): 10 mg via ORAL

## 2023-07-06 MED ORDER — SODIUM CHLORIDE 3 % IN NEBU
3 | RESPIRATORY_TRACT | Status: DC | PRN
Start: 2023-07-06 — End: 2023-07-06

## 2023-07-06 MED ORDER — ARFORMOTEROL TARTRATE 15 MCG/2ML IN NEBU
152 MCG/2ML | Freq: Two times a day (BID) | RESPIRATORY_TRACT | Status: DC
Start: 2023-07-06 — End: 2023-07-21
  Administered 2023-07-07 – 2023-07-21 (×27): 15 ug via RESPIRATORY_TRACT

## 2023-07-06 MED ORDER — SODIUM CHLORIDE 3 % IN NEBU
3 | Freq: Two times a day (BID) | RESPIRATORY_TRACT | Status: DC
Start: 2023-07-06 — End: 2023-07-12
  Administered 2023-07-07 – 2023-07-11 (×7): 4 mL via RESPIRATORY_TRACT

## 2023-07-06 MED ORDER — APIXABAN 2.5 MG PO TABS
2.5 MG | Freq: Two times a day (BID) | ORAL | Status: DC
Start: 2023-07-06 — End: 2023-07-14
  Administered 2023-07-06 – 2023-07-14 (×16): 2.5 mg via ORAL

## 2023-07-06 MED ORDER — BRIMONIDINE TARTRATE 0.2 % OP SOLN
0.2 % | Freq: Three times a day (TID) | OPHTHALMIC | Status: DC
Start: 2023-07-06 — End: 2023-07-21
  Administered 2023-07-06 – 2023-07-21 (×48): 1 [drp] via OPHTHALMIC

## 2023-07-06 MED ORDER — TIOTROPIUM BROMIDE-OLODATEROL 2.5-2.5 MCG/ACT IN AERS
2.5-2.5 | Freq: Every day | RESPIRATORY_TRACT | Status: DC
Start: 2023-07-06 — End: 2023-07-06

## 2023-07-06 MED ORDER — ATROPINE SULFATE 1 % OP SOLN
1 % | Freq: Every evening | OPHTHALMIC | Status: DC
Start: 2023-07-06 — End: 2023-07-21
  Administered 2023-07-06 – 2023-07-21 (×16): 1 [drp] via OPHTHALMIC

## 2023-07-06 MED ORDER — BARIUM SULFATE 40 % PO SUSP
40 | Freq: Once | ORAL | Status: AC | PRN
Start: 2023-07-06 — End: 2023-07-06
  Administered 2023-07-06: 15:00:00 15 mL via ORAL

## 2023-07-06 MED ORDER — SODIUM CHLORIDE 3 % IN NEBU
3 | Freq: Two times a day (BID) | RESPIRATORY_TRACT | Status: DC
Start: 2023-07-06 — End: 2023-07-06
  Administered 2023-07-06: 20:00:00 4 mL via RESPIRATORY_TRACT

## 2023-07-06 MED ORDER — SODIUM CHLORIDE 3 % IN NEBU
3 % | RESPIRATORY_TRACT | Status: DC | PRN
Start: 2023-07-06 — End: 2023-07-21

## 2023-07-06 MED ORDER — BARIUM SULFATE 40 % PO SUSR
40 | Freq: Once | ORAL | Status: AC | PRN
Start: 2023-07-06 — End: 2023-07-06
  Administered 2023-07-06: 15:00:00 30 mL via ORAL

## 2023-07-06 MED ORDER — BARIUM SULFATE 40 % PO PSTE
40 | Freq: Once | ORAL | Status: AC | PRN
Start: 2023-07-06 — End: 2023-07-06
  Administered 2023-07-06: 15:00:00 30 mL via ORAL

## 2023-07-06 MED FILL — PREDNISONE 20 MG PO TABS: 20 MG | ORAL | Qty: 2

## 2023-07-06 MED FILL — BRIMONIDINE TARTRATE 0.2 % OP SOLN: 0.2 % | OPHTHALMIC | Qty: 5

## 2023-07-06 MED FILL — IPRATROPIUM-ALBUTEROL 0.5-2.5 (3) MG/3ML IN SOLN: 0.5-2.533 (3) MG/3ML | RESPIRATORY_TRACT | Qty: 3

## 2023-07-06 MED FILL — ROSUVASTATIN CALCIUM 20 MG PO TABS: 20 MG | ORAL | Qty: 1

## 2023-07-06 MED FILL — VARIBAR THIN LIQUID 40 % PO SUSR: 40 % | ORAL | Qty: 30

## 2023-07-06 MED FILL — CARVEDILOL 6.25 MG PO TABS: 6.25 MG | ORAL | Qty: 1

## 2023-07-06 MED FILL — PANTOPRAZOLE SODIUM 40 MG PO TBEC: 40 MG | ORAL | Qty: 1

## 2023-07-06 MED FILL — FUROSEMIDE 20 MG PO TABS: 20 MG | ORAL | Qty: 1

## 2023-07-06 MED FILL — ELIQUIS 5 MG PO TABS: 5 MG | ORAL | Qty: 1

## 2023-07-06 MED FILL — SODIUM CHLORIDE 3 % IN NEBU: 3 % | RESPIRATORY_TRACT | Qty: 4

## 2023-07-06 MED FILL — ATROPINE SULFATE 1 % OP SOLN: 1 % | OPHTHALMIC | Qty: 2

## 2023-07-06 MED FILL — SODIUM BICARBONATE 650 MG PO TABS: 650 MG | ORAL | Qty: 1

## 2023-07-06 MED FILL — PIPERACILLIN SOD-TAZOBACTAM SO 3.375 (3-0.375) G IV SOLR: 3.3753-0.375 (3-0.375) g | INTRAVENOUS | Qty: 3375

## 2023-07-06 MED FILL — ELIQUIS 2.5 MG PO TABS: 2.5 MG | ORAL | Qty: 1

## 2023-07-06 MED FILL — TAGITOL V 40 % PO SUSP: 40 % | ORAL | Qty: 60

## 2023-07-06 MED FILL — VARIBAR PUDDING 40 % PO PSTE: 40 % | ORAL | Qty: 30

## 2023-07-06 MED FILL — GUAIFENESIN ER 600 MG PO TB12: 600 MG | ORAL | Qty: 1

## 2023-07-06 MED FILL — DOXAZOSIN MESYLATE 4 MG PO TABS: 4 MG | ORAL | Qty: 1

## 2023-07-06 NOTE — Progress Notes (Signed)
Hospitalist Progress Note   Admit Date:  07/04/2023 11:46 AM   Name:  Paul Medina   Age:  89 y.o.  Sex:  male  DOB:  1931/02/23   MRN:  [id number]   Room:  357/01    Presenting/Chief Complaint: Shortness of Breath     Reason(s) for Admission: Severe sepsis (HCC) [A41.9, R65.20]  Pneumonia of right lung due to infectious organism, unspecified part of lung [J18.9]  Sepsis, due to unspecified organism, unspecified whether acute organ dysfunction present [facility]) [A41.9]     Hospital Course:   Kellin Bartling is a 89 y.o. male with medical history of A-fib, CHF with reduced ejection fraction, hypertension, hyperlipidemia, CAD, TIA, left eye blindness, OSA, CKD stage III, asthma who presented with worsening cough and shortness of breath.  Of note patient was recently hospitalized at [facility] downtown and discharged yesterday.  Patient was hospitalized during that visit for A-fib with RVR and community-acquired pneumonia, asthma exacerbation, and COVID-19 pneumonia.  Patient was discharged home to his studio apartment where he lives alone but has children in the area.  He states that his shortness of breath got worse and he was not eating or drinking.  During last hospitalization patient was evaluated by speech therapy as well as GI for dysphagia.  No EGD was recommended by GI at that time.  Speech therapy recommended minced and moist diet with thin liquids.  Patient states that he is still been having dysphagia and occasionally chokes on food/liquids.     On arrival to the ED notable labs include lactic acid 3.7 down trended to 0.5, Pro-Cal 0.08, WBCs 12 (3 at discharge on 2/10), creatinine 1.46 (near baseline).  CT chest PE protocol shows no PE but does show worsening right-sided pneumonia with mild improvement of left lung from previous exam, worsening mucous plugging in lower lobes raising concern for aspiration pneumonia/pneumonitis.  Patient started on Zyvox and Zosyn in ED, stop Zyvox on 2/12 as MRSA  swab negative.  Blood cultures no growth to date.  Speech therapy recommended regular consistency and thin liquids.      Subjective & 24hr Events:   Evaluated at bedside.  Lying in bed on nasal cannula.  Continues to have wet cough but cough is weak and having difficulty coughing up mucus.      Assessment & Plan:     Severe sepsis:  -As evidenced by tachypnea, leukocytosis, lactic acid 3.7 in setting of hospital-acquired pneumonia  -Status post Zyvox on 2/12 as MRSA nares negative  -Continue Zosyn  -Blood cultures no growth to date  -UA unremarkable     Hospital-acquired pneumonia:  -Recent hospital admission for A-fib with RVR, community-acquired pneumonia, asthma exacerbation, and COVID-19  -Concern for aspiration pneumonia, speech therapy evaluated and diet ordered  -Chest CT PE protocol: No PE, pneumonia (right greater than left), mucous plugging with concern for aspiration  -Status post Zyvox, stopped on 2/12 as MRSA nares negative  -Continue Zosyn  -Mucinex twice daily  -Incentive spirometer  -Hypertonic saline nebulizer 2/13  -Blood cultures no growth to date     Atrial fibrillation, not in RVR:  -Recent hospital admission with RVR  -Continue home Eliquis, Coreg     Heart failure with reduced ejection fraction:  -Echocardiogram 06/29/2023: EF 30-35%, grade 2 diastolic dysfunction  -Seen by cardiology at last hospital admission  -Continue home Coreg  -Holding home losartan, SGLT2, Aldactone given severe sepsis, will restart when able  -Will need repeat outpatient echocardiogram in 3 months  Left great toe pain, resolved:  -Left foot x-ray shows no fractures, hammertoe deformities  -Low suspicion for gout given exam, will monitor     History of TIA:  -Continue home aspirin 81 mg daily     History of B-cell lymphoma:  -Continue outpatient follow-up with Dr. Rulon Abide at [facility] following discharge     Obstructive sleep apnea:  -Respiratory consult for CPAP while inpatient     CAD:  -Continue home aspirin,  Coreg  -Will restart home losartan, SGLT2, and Aldactone when able, held in setting of severe sepsis     Hypertension:  -Holding antihypertensives in setting of severe sepsis     Hyperlipidemia:  -Continue home Crestor     BPH:  -Continue home Cardura      Anticipated Discharge Arrangements:   STR    PT/OT evals ordered?  Therapy evals ordered  Diet:  ADULT DIET; Dysphagia - Minced and Moist; No Drinking Straws  VTE prophylaxis: Already on anticoagulation  Code status: DNR      Non-peripheral Lines and Tubes (if present):          Telemetry (if present):  Cardiac/Telemetry Monitor On: No        Hospital Problems:  Principal Problem:    Severe sepsis (HCC)  Active Problems:    Chronic heart failure with mildly reduced ejection fraction (HFmrEF, 41-49%) (HCC)    HTN (hypertension)    HLD (hyperlipidemia)    Coronary atherosclerosis of native coronary artery    TIA (transient ischemic attack)    OSA (obstructive sleep apnea)    Stage 3 chronic kidney disease (HCC)    Atrial fibrillation (HCC)    Hospital-acquired pneumonia  Resolved Problems:    * No resolved hospital problems. *      Objective:   Patient Vitals for the past 24 hrs:   Temp Pulse Resp BP SpO2   07/06/23 1204 -- -- -- 134/75 --   07/06/23 0745 -- 77 16 -- 96 %   07/06/23 0739 98.2 F (36.8 C) 71 17 134/75 97 %   07/06/23 0500 -- -- -- -- 95 %   07/05/23 2016 -- 69 17 -- 98 %   07/05/23 1930 97.7 F (36.5 C) 72 18 105/64 96 %   07/05/23 1510 -- 86 16 -- 96 %       Oxygen Therapy  SpO2: 96 %  Pulse via Oximetry: 83 beats per minute  Pulse Oximeter Device Mode: Intermittent  Pulse Oximeter Device Location: Left, Finger  O2 Device: Nasal cannula  O2 Flow Rate (L/min): 3 L/min    Estimated body mass index is 29.05 kg/m as calculated from the following:    Height as of this encounter: 1.676 m (5\' 6" ).    Weight as of this encounter: 81.6 kg (180 lb).    Intake/Output Summary (Last 24 hours) at 07/06/2023 1232  Last data filed at 07/06/2023 1136  Gross per 24  hour   Intake 200 ml   Output 500 ml   Net -300 ml         Physical Exam:   General:           Elderly, resting in bed  Head:               Normocephalic, atraumatic  Eyes:               Sclerae appear normal.   ENT:  Nares appear normal.  Moist oral mucosa  Neck:               No restricted ROM.  Trachea midline   CV:                  RRR.  No m/r/g.   Lungs:             Rales noted bilaterally.  Symmetric expansion.  Weak wet cough noted, nasal cannula oxygen  Abdomen:        Soft, nontender, nondistended.  Extremities:     No cyanosis or clubbing.  No edema  Skin:                No rashes.  Normal coloration.   Warm and dry.    Neuro:             CN II-XII grossly intact.  Moves all extremities  Psych:             Normal mood and affect.       I have personally reviewed labs and tests:  Recent Labs:  Recent Results (from the past 48 hour(s))   Culture, Blood 1    Collection Time: 07/04/23  1:09 PM    Specimen: Blood   Result Value Ref Range    Special Requests RIGHT  Antecubital        Culture NO GROWTH 2 DAYS     Culture, Blood 2    Collection Time: 07/04/23  1:09 PM    Specimen: Blood   Result Value Ref Range    Special Requests LEFT  FOREARM        Culture NO GROWTH 2 DAYS     Lactic Acid    Collection Time: 07/04/23  2:42 PM   Result Value Ref Range    Lactic Acid 2.5 (H) 0.5 - 2.0 mmol/L   Lactic Acid    Collection Time: 07/04/23  7:20 PM   Result Value Ref Range    Lactic Acid 2.6 (H) 0.5 - 2.0 mmol/L   MRSA/Staph Aureus DNA    Collection Time: 07/04/23 11:16 PM    Specimen: Nasal Swab   Result Value Ref Range    MRSA by PCR Not detected NOTD      SA by PCR Not detected NOTD     Lactic Acid    Collection Time: 07/04/23 11:32 PM   Result Value Ref Range    Lactic Acid 2.2 (H) 0.5 - 2.0 mmol/L   CBC with Auto Differential    Collection Time: 07/05/23  3:26 AM   Result Value Ref Range    WBC 8.6 4.3 - 11.1 K/uL    RBC 3.28 (L) 4.23 - 5.6 M/uL    Hemoglobin 9.6 (L) 13.6 - 17.2 g/dL    Hematocrit  16.1 (L) 41.1 - 50.3 %    MCV 87.2 82.0 - 102.0 FL    MCH 29.3 26.1 - 32.9 PG    MCHC 33.6 31.4 - 35.0 g/dL    RDW 09.6 (H) 04.5 - 14.6 %    Platelets 115 (L) 150 - 450 K/uL    MPV 13.6 (H) 9.4 - 12.3 FL    nRBC 0.00 0.0 - 0.2 K/uL    Neutrophils % 75.9 43.0 - 78.0 %    Lymphocytes % 4.8 (L) 13.0 - 44.0 %    Monocytes % 11.0 4.0 - 12.0 %    Eosinophils % 0.0 (L) 0.5 - 7.8 %  Basophils % 0.7 0.0 - 2.0 %    Immature Granulocytes % 7.6 (H) 0.0 - 5.0 %    Neutrophils Absolute 6.53 1.70 - 8.20 K/UL    Lymphocytes Absolute 0.41 (L) 0.50 - 4.60 K/UL    Monocytes Absolute 0.95 0.10 - 1.30 K/UL    Eosinophils Absolute 0.00 0.00 - 0.80 K/UL    Basophils Absolute 0.06 0.00 - 0.20 K/UL    Immature Granulocytes Absolute 0.65 (H) 0.0 - 0.5 K/UL    RBC Comment MODERATE  ANISOCYTOSIS        RBC Comment MODERATE  POIKILOCYTOSIS        WBC Comment Result Confirmed By Smear      Platelet Comment ADEQUATE      Differential Type MANUAL     Basic Metabolic Panel w/ Reflex to MG    Collection Time: 07/05/23  3:26 AM   Result Value Ref Range    Sodium 141 136 - 145 mmol/L    Potassium 4.7 3.5 - 5.1 mmol/L    Chloride 109 (H) 98 - 107 mmol/L    CO2 20 20 - 29 mmol/L    Anion Gap 12 7 - 16 mmol/L    Glucose 157 (H) 70 - 99 mg/dL    BUN 45 (H) 8 - 23 MG/DL    Creatinine 1.61 (H) 0.80 - 1.30 MG/DL    Est, Glom Filt Rate 48 (L) >60 ml/min/1.71m2    Calcium 8.8 8.8 - 10.2 MG/DL   Lactic Acid    Collection Time: 07/05/23  3:26 AM   Result Value Ref Range    Lactic Acid 1.0 0.5 - 2.0 mmol/L   Uric Acid    Collection Time: 07/05/23  3:26 AM   Result Value Ref Range    Uric Acid 5.6 3.9 - 8.2 MG/DL   CBC with Auto Differential    Collection Time: 07/06/23  6:38 AM   Result Value Ref Range    WBC 10.9 4.3 - 11.1 K/uL    RBC 3.34 (L) 4.23 - 5.6 M/uL    Hemoglobin 9.7 (L) 13.6 - 17.2 g/dL    Hematocrit 09.6 (L) 41.1 - 50.3 %    MCV 86.5 82.0 - 102.0 FL    MCH 29.0 26.1 - 32.9 PG    MCHC 33.6 31.4 - 35.0 g/dL    RDW 04.5 (H) 40.9 - 14.6 %    Platelets  135 (L) 150 - 450 K/uL    MPV 12.5 (H) 9.4 - 12.3 FL    nRBC 0.00 0.0 - 0.2 K/uL    Neutrophils % 68.5 43.0 - 78.0 %    Lymphocytes % 4.4 (L) 13.0 - 44.0 %    Monocytes % 19.1 (H) 4.0 - 12.0 %    Eosinophils % 0.0 (L) 0.5 - 7.8 %    Basophils % 0.4 0.0 - 2.0 %    Immature Granulocytes % 7.6 (H) 0.0 - 5.0 %    Neutrophils Absolute 7.47 1.70 - 8.20 K/UL    Lymphocytes Absolute 0.48 (L) 0.50 - 4.60 K/UL    Monocytes Absolute 2.08 (H) 0.10 - 1.30 K/UL    Eosinophils Absolute 0.00 0.00 - 0.80 K/UL    Basophils Absolute 0.04 0.00 - 0.20 K/UL    Immature Granulocytes Absolute 0.83 (H) 0.0 - 0.5 K/UL    RBC Comment SLIGHT  ANISOCYTOSIS + POIKILOCYTOSIS        WBC Comment Result Confirmed By Smear      Platelet Comment OCCASIONAL  Differential Type AUTOMATED     Basic Metabolic Panel w/ Reflex to MG    Collection Time: 07/06/23  6:38 AM   Result Value Ref Range    Sodium 141 136 - 145 mmol/L    Potassium 4.7 3.5 - 5.1 mmol/L    Chloride 105 98 - 107 mmol/L    CO2 22 20 - 29 mmol/L    Anion Gap 14 7 - 16 mmol/L    Glucose 115 (H) 70 - 99 mg/dL    BUN 45 (H) 8 - 23 MG/DL    Creatinine 1.61 (H) 0.80 - 1.30 MG/DL    Est, Glom Filt Rate 38 (L) >60 ml/min/1.47m2    Calcium 8.9 8.8 - 10.2 MG/DL       No results for input(s): "COVID19" in the last 72 hours.    Current Meds:  Current Facility-Administered Medications   Medication Dose Route Frequency    apixaban (ELIQUIS) tablet 2.5 mg  2.5 mg Oral 2 times per day    rosuvastatin (CRESTOR) tablet 10 mg  10 mg Oral Nightly    prednisoLONE acetate (PRED FORTE) 1 % ophthalmic suspension 1 drop  1 drop Left Eye 6 times per day    atropine 1 % ophthalmic solution 1 drop  1 drop Left Eye Nightly    brimonidine (ALPHAGAN) 0.2 % ophthalmic solution 1 drop  1 drop Left Eye TID    sodium chloride flush 0.9 % injection 5-40 mL  5-40 mL IntraVENous 2 times per day    sodium chloride flush 0.9 % injection 5-40 mL  5-40 mL IntraVENous PRN    0.9 % sodium chloride infusion   IntraVENous PRN     potassium chloride (KLOR-CON M) extended release tablet 40 mEq  40 mEq Oral PRN    Or    potassium bicarb-citric acid (EFFER-K) effervescent tablet 40 mEq  40 mEq Oral PRN    Or    potassium chloride 10 mEq/100 mL IVPB (Peripheral Line)  10 mEq IntraVENous PRN    magnesium sulfate 2000 mg in 50 mL IVPB premix  2,000 mg IntraVENous PRN    ondansetron (ZOFRAN-ODT) disintegrating tablet 4 mg  4 mg Oral Q8H PRN    Or    ondansetron (ZOFRAN) injection 4 mg  4 mg IntraVENous Q6H PRN    polyethylene glycol (GLYCOLAX) packet 17 g  17 g Oral Daily PRN    acetaminophen (TYLENOL) tablet 650 mg  650 mg Oral Q6H PRN    Or    acetaminophen (TYLENOL) suppository 650 mg  650 mg Rectal Q6H PRN    piperacillin-tazobactam (ZOSYN) 3,375 mg in sodium chloride 0.9 % 50 mL IVPB (mini-bag)  3,375 mg IntraVENous Q8H    guaiFENesin (MUCINEX) extended release tablet 600 mg  600 mg Oral BID    ipratropium 0.5 mg-albuterol 2.5 mg (DUONEB) nebulizer solution 1 Dose  1 Dose Inhalation Q4H WA RT    carvedilol (COREG) tablet 6.25 mg  6.25 mg Oral BID WC    sodium bicarbonate tablet 650 mg  650 mg Oral 4x Daily    predniSONE (DELTASONE) tablet 40 mg  40 mg Oral Daily    doxazosin (CARDURA) tablet 4 mg  4 mg Oral Daily    pantoprazole (PROTONIX) tablet 40 mg  40 mg Oral QAM AC    furosemide (LASIX) tablet 20 mg  20 mg Oral Daily       Signed:  [facility] [place], [place]    Part of this note may have  been written by using a voice dictation software.  The note has been proof read but may still contain some grammatical/other typographical errors.

## 2023-07-06 NOTE — Plan of Care (Signed)
Problem: Discharge Planning  Goal: Discharge to home or other facility with appropriate resources  Outcome: Progressing     Problem: Safety - Adult  Goal: Free from fall injury  Outcome: Progressing     Problem: ABCDS Injury Assessment  Goal: Absence of physical injury  Outcome: Progressing     Problem: Respiratory - Adult  Goal: Achieves optimal ventilation and oxygenation  Outcome: Progressing

## 2023-07-06 NOTE — Progress Notes (Signed)
Nutrition Assessment  This assessment was completed remotely from within the hospital. Patient consent was obtained for remote assessment.  Assessment Type: Initial, Positive nutrition screen  Reason for visit:  Best Practice Alert: Malnutrition Screening Tool   Malnutrition Screening Tool Score: 2    Nutrition Intervention:   Food and/or Nutrient Delivery:   Meals and Snacks:  Diet: Continue current diet per SLP evaluation  Medical Food Supplements:   Medical food supplement therapy:  Initiate Ensure Plus High Protein (high calorie high protein) 350 calories, 20 grams protein per 8 ounce serving . Specify strawberry or chocolate.       Malnutrition Assessment:  Academy/A.S.P.E.N Clinical Malnutrition Criteria  Malnutrition Status: At risk for malnutrition (Decreased intake d/t dyspnea, recent admission with use of ONS)    Nutrition Focused Physical Exam: Unremarkable  (2/6 RD encounter)    Nutrition Assessment:  Food/Nutrition Related History: Per prior RD encounter 2/6: Pt provides the following nutrition hx: Notes that for the past 10 days, his appetite has been limited. Eating a small breakfast of oatmeal or cereal in the morning. Drinking fluids throughout the day. Occasionally taking soups in the afternoon. Did not take ONS at home. Does endorse needing soft foods at baseline due to esophageal dysphagia. Notes his esophagus is "bent". Per chart review; patient has received several dilations at the [place].  Last known EGD: 06/28/19- Spastic esophagus; Presbyesophagus; Nodular erythema at gastric cardia  2/13: Intake average was 50-75% for 3 recorded meals for last 4 days of prior admission. Pt c/o being too "winded" to talk at length.       Weight History: Limited OV weights available in EMR: 10/24/22: 170# (urology), 02/28/23: 179# (Onc), 05/11/23:184# (pulm)  CBW: 181# (06/29/23 bed scale)  Overall slight tend up in weight over the past year. Pt reports this is because he has made to a senior living facility recently  and the meals are included. Notes that he now eats much more at baseline than he used to and has gained weight.   Nutrition Background:       PMH: asthma, HTN, OSA, TIA, GERD, blind in L eye, CKD3, TIA, B-cell lymphoma, CAD, CHF.  Recent admission 2/5 to 2/10 with COVID PNA, new Afib  Presented back with cough and persistent dyspnea, not eating or drinking  Admitted with sepsis and PNA  Nutrition Monitoring/Evaluation:  SLP eval: 2/7 -MM5, OK to upgrade to SB upon pt request, 2/12-regular, 2/13-MBS, MM5 with thin liquids, no straw  Talked to pt via mobile phone. He says is is winded and request to limit conversation at this time/ he reports intake is improved "a little" and he is agreeable to ONS. He says the difficulty breathing is main barrier to po intake at this time.     Current Nutrition Therapies:  ADULT DIET; Dysphagia - Minced and Moist; No Drinking Straws    Current Intake:   Average Meal Intake: 26-50% (For 3 recorded meals since admission)        Anthropometric Measures:  Height: 167.6 cm (5\' 6" )  Current Body Wt: 81.6 kg (179 lb 14.3 oz) (2/11), Weight source: Stated  BMI: 29, Overweight (BMI 25.0-29.9)  Admission Body Weight: 81.6 kg (179 lb 14.3 oz) (stated)  Ideal Body Weight (Kg) (Calculated): 65 kg (142 lbs),    BMI Category Overweight (BMI 25.0-29.9)    Comparative Standards:  Energy (kcal/day): 1610-9604 (18-22 kcal/kg) (Kcal/kg Weight used: 81.6 kg Admission  Protein (g/day): 65-82 (0.8-1 g/kg) Weight Used: (Admission) 81.6 kg  Fluid (ml/day):   (1 ml/kcal)    Nutrition Diagnosis:   Inadequate oral intake related to impaired respiratory function as evidenced by patient reported barriers (intake as above)    Nutrition Goal(s):      Active Goal: PO intake 50% or greater, by next RD assessment       Discharge Planning:    Too soon to determine    Aura Bibby, RD

## 2023-07-06 NOTE — Progress Notes (Signed)
ACUTE OCCUPATIONAL THERAPY GOALS:   (Developed with and agreed upon by patient and/or caregiver.)  1. Patient will perform grooming with SPV and adaptive equipment as needed.  2. Patient will perform upper body dressing with SPV and adaptive equipment as needed.  3. Patient will perform lower body dressing with SPV and adaptive equipment as needed.  4. Patient will perform bathing with SPV and adaptive equipment as needed.  5. Patient will perform toileting and toilet transfer with SPV and adaptive equipment as needed.  6. Patient will perform ADL functional mobility and tranfers in room with SPV and adaptive equipment as needed.  7. Patient/family to demonstrate knowledge of home safety and DME recommendations.    Timeframe: 7 visits     OCCUPATIONAL THERAPY Daily Note and PM       OT Visit Days: 2  Acknowledge Orders  Time  OT Charge Capture  Rehab Caseload Tracker      Paul Medina is a 89 y.o. male   PRIMARY DIAGNOSIS: Severe sepsis (HCC)  Severe sepsis (HCC) [A41.9, R65.20]  Pneumonia of right lung due to infectious organism, unspecified part of lung [J18.9]  Sepsis, due to unspecified organism, unspecified whether acute organ dysfunction present (HCC) [A41.9]       Reason for Referral: Generalized Muscle Weakness (M62.81)  Other lack of cordination (R27.8)  Difficulty in walking, Not elsewhere classified (R26.2)  Dizziness and Giddiness (R42)  Inpatient: Payor: [employer] / Plan: [employer] / Product Type: *No Product type* /     ASSESSMENT:     REHAB RECOMMENDATIONS:   Recommendation to date pending progress:  Setting:  Short-term Rehab  Continued occupational therapy recommended at discharge    Equipment:    None     ASSESSMENT:  Paul Medina is admitted for the above diagnoses and presents with overall deficits in strength, functional mobility and ADL performance.  He lives alone in a senior living apartment community with level entry. At baseline (prior to recent hospitalizations), he reports  independence with ADLs and mobility (sometimes uses rollator). Today, he was able to perform bed mobility, household mobility, and grooming tasks seated in recliner with setup. Patient has been using urinal for toileting--donned new brief at EOB. He remains on 3L NC during session with episodes of wet/productive sounding cough. SpO2 97%. Presents with decreased activity tolerance impacting his ability to safely and independently perform ADLs. He was left in recliner chair with all needs met and in reach. Xray tech at bedside. Pt is functioning below their baseline and will benefit from skilled OT during hospital stay. Anticipate pt will benefit from continued therapy upon discharge.     07/06/2023: Patient upon arrival reclined in bed, on 3L of O2 reports shortness of breath, spO2 95%. Patient completed supine to sit with stand by assist. Patient stood with rolling walker with stand by assist and performed functional mobility with rolling walker stand by assist to sit in bedside chair. Patient needed extended time to complete all tasks today. RT notified. Lengthy discussion with patient about discharge planning, patient is confused, he did not want to have this conversation at this time. Patient will continue to benefit from skilled OT services to maximize independence and safety with ADL tasks.        [employer] AM-PACT "6 Clicks" Daily Activity Inpatient Short Form:    AM-PAC Daily Activity - Inpatient   How much help is needed for putting on and taking off regular lower body clothing?: A Lot  How  much help is needed for bathing (which includes washing, rinsing, drying)?: A Lot  How much help is needed for toileting (which includes using toilet, bedpan, or urinal)?: A Little  How much help is needed for putting on and taking off regular upper body clothing?: None  How much help is needed for taking care of personal grooming?: None  How much help for eating meals?: None  AM-PAC Inpatient Daily Activity Raw  Score: 19  AM-PAC Inpatient ADL T-Scale Score : 40.22  ADL Inpatient CMS 0-100% Score: 42.8  ADL Inpatient CMS G-Code Modifier : CK           SUBJECTIVE:     Paul Medina states, "I just feel like I can't breath."     Social/Functional Lives With: Alone  Type of Home: Senior housing apartment  Home Layout: One level  Home Access: Level entry, Occupational psychologist Shower/Tub: Pension scheme manager: Midwife: Grab bars in shower, Grab bars around toilet  Bathroom Accessibility: Accessible  Home Equipment: Engineer, production Help From: Family  Prior Level of Assist for ADLs: Independent  Prior Level of Assist for [employer]: Independent  Prior Level of Assist for Transfers: Independent  Occupation: Retired    OBJECTIVE:     LINES / DRAINS / AIRWAY: None    RESTRICTIONS/PRECAUTIONS:       PAIN: VITALS / O2:   Pre Treatment:      Reports great toe pain    Post Treatment: none       Vitals          Oxygen   3L NC         GROSS EVALUATION: INTACT IMPAIRED   (See Comments)   UE AROM [x]  []    UE PROM [x]  []    Strength []   Generalized weakness     Posture / Balance []   Fair using RW for functional mobility and transfers   Sensation [x]      Coordination [x]        Tone [x]        Edema []     Activity Tolerance []   Decreased from baseline     Hand Dominance R []  L []       COGNITION/  PERCEPTION: INTACT IMPAIRED   (See Comments)   Orientation [x]      Vision [x]      Hearing [x]      Cognition  []      Perception []        MOBILITY: I Mod I S SBA CGA Min Mod Max Total  NT x2 Comments:   Bed Mobility    Rolling []  []  []  []  []  []  []  []  []  []  []     Supine to Sit []  []  []  [x]  []  []  []  []  []  []  []     Scooting []  []  []  [x]  []  []  []  []  []  []  []     Sit to Supine []  []  []  []  []  []  []  []  []  [x]  []     Transfers    Sit to Stand []  []  []  [x]  [x]  []  []  []  []  []  []     Bed to Chair []  []  []  [x]  [x]  []  []  []  []  []  []  RW   Stand to Sit []  []  []  [x]  [x]  []  []  []  []  []  []     Tub/Shower []  []  []  []  []  []  []  []  []  []  []       Toilet []  []  []  []  []  []  []  []  []  []  []       []  []  []  []  []  []  []  []  []  []  []   I=Independent, Mod I=Modified Independent, S=Supervision/Setup, SBA=Standby Assistance, CGA=Contact Guard Assistance, Min=Minimal Assistance, Mod=Moderate Assistance, Max=Maximal Assistance, Total=Total Assistance, NT=Not Tested    ACTIVITIES OF DAILY LIVING: I Mod I S SBA CGA Min Mod Max Total NT Comments   BASIC ADLs:              Upper Body Bathing  []  []  []  []  []  []  []  []  []  []      Lower Body Bathing []  []  []  []  []  []  []  []  []  []      Toileting []  []  []  []  []  []  []  []  []  []     Upper Body Dressing []  []  []  []  []  []  []  []  []  []     Lower Body Dressing []  []  []  []  []  []  []  []  []  []     Feeding []  []  []  []  []  []  []  []  []  []     Grooming []  []  []  []  []  []  []  []  []  []     Personal Device Care []  []  []  []  []  []  []  []  []  []     Functional Mobility []  []  []  [x]  [x]  []  []  []  []  []  Mobility in room, additional time needed, RW   I=Independent, Mod I=Modified Independent, S=Supervision/Setup, SBA=Standby Assistance, CGA=Contact Guard Assistance, Min=Minimal Assistance, Mod=Moderate Assistance, Max=Maximal Assistance, Total=Total Assistance, NT=Not Tested    PLAN:   FREQUENCY/DURATION     for duration of hospital stay or until stated goals are met, whichever comes first.    PROBLEM LIST:   (Skilled intervention is medically necessary to address:)  Decreased ADL/Functional Activities  Decreased Activity Tolerance  Decreased Balance  Decreased Gait Ability  Decreased Safety Awareness  Decreased Strength  Decreased Transfer Abilities  Increased Pain   INTERVENTIONS PLANNED:  (Benefits and precautions of occupational therapy have been discussed with the patient.)  Self Care Training  Therapeutic Activity  Therapeutic Exercise/HEP  Neuromuscular Re-education  Manual Therapy  Education         TREATMENT:     TREATMENT:   Self Care (24 minutes): Patient participated in functional mobility, bed mobility, functional transfer, energy conservation, and  assistive device in unsupported sitting and standing with minimal verbal, manual, and tactile cueing to increase independence, increase activity tolerance, and increase safety awareness. The patient was educated on role of occupational therapy, compensatory technique during ADL , strategies to improve safety, proper use of assistive device, recommended equipment, transfer training and safety, and energy conservation techniques during ADL/IADLS and patient verbalized understanding and demonstrated understanding.     TREATMENT GRID:  N/A    AFTER TREATMENT PRECAUTIONS: Bed/Chair Locked, Call light within reach, Chair, Heels floated, Needs within reach, RN notified, and RT notified    INTERDISCIPLINARY COLLABORATION:  RN/ PCT, PT/ PTA, and RT    EDUCATION:  Education Given To: Patient  Education Provided: Role of Therapy;Plan of Animal nutritionist  Education Method: Verbal;Demonstration  Barriers to Learning: None  Education Outcome: Verbalized understanding;Continued education needed    TOTAL TREATMENT DURATION AND TIME:  Time In: 1446  Time Out: 1510  Minutes: 24    [place], OT

## 2023-07-06 NOTE — Progress Notes (Signed)
ACUTE PHYSICAL THERAPY GOALS:   (Developed with and agreed upon by patient and/or caregiver.)      LTG:  (1.)Mr. Khachatryan will move from supine to sit and sit to supine  in bed with INDEPENDENT within 7 treatment day(s).    (2.)Mr. Cudd will transfer from bed to chair and chair to bed with SUPERVISION using the least restrictive device within 7 treatment day(s).    (3.)Mr. Nerio will ambulate with SUPERVISION for 200 feet with the least restrictive device within 7 treatment day(s).  ________________________________________________________________________________________________      PHYSICAL THERAPY Daily Note and PM  (Link to Caseload Tracking: PT Visit Days : 2  Acknowledge Orders  Time In/Out  PT Charge Capture  Rehab Caseload Tracker    Benino Korinek is a 89 y.o. male   PRIMARY DIAGNOSIS: Severe sepsis (HCC)  Severe sepsis (HCC) [A41.9, R65.20]  Pneumonia of right lung due to infectious organism, unspecified part of lung [J18.9]  Sepsis, due to unspecified organism, unspecified whether acute organ dysfunction present (HCC) [A41.9]       Reason for Referral: Generalized Muscle Weakness (M62.81)  Difficulty in walking, Not elsewhere classified (R26.2)  Inpatient: Payor: [employer] / Plan: [facility] [employer] / Product Type: *No Product type* /     ASSESSMENT:     REHAB RECOMMENDATIONS:   Recommendation to date pending progress:  Setting:  Short-term Rehab    Equipment:    None     ASSESSMENT:  Mr. Nazari admitted with above diagnoses.  He presented to the ER with complaints of cough and shortness of breath.  CT showed worsening R-sided pneumonia and worsening mucus plugging.  Patient was hospitalized at [facility] 2/5-2/10 with a-fib and pneumonia.  Short term rehab was recommended during last admission but patient declined and d/c'd home with HH orders.  Patient reports he was told he would have to be in a facility for 2 weeks for rehab and he did not want that.  Explained to patient that there was no  way for [place] to know how long he would need to stay for rehab but he reports he still plans to return home with [facility].  Patient currently demonstrates decreased strength, balance, mobility, and tolerance for activity.  He would benefit from therapy to address these deficits.  He is on 3L O2 at this time but did not qualify for O2 2 days ago when he was d/c'd from [facility].  Patient educated on need for mobility/time out of bed to improve strength, mobility, and ability to safely return home.       2/13:  Patient seen in the afternoon after MBS.  On 3L with sats 95-97% but reports he can't catch his breath.  RT called and coming to check on patient shortly.  He did get out of bed and ambulate around the room to the chair.  He did sound better sitting up but still anxious about his breathing.  Patient does not want to discuss d/c planning.  Reports he went home alone from [facility] because he was made to.  He doesn't seem to recall any prior conversations about SHORT TERM REHAB while at [facility] and doesn't recall telling this therapist yesterday he would be going home at d/c.  Patient doesn't really appear appropriate with d/c home alone.  Asked if he had any family to assist him in making decisions-has a child but they have COVID as well.  Patient asked therapist to "have a heart" and would not further discuss d/c planning.  He did say he told the MD this am to "pull the plug".  Will continue to work with patient and hopefully develop a safe d/c plan with his cooperation.        [employer] AM-PACT "6 Clicks" Basic Mobility Inpatient Short Form  AM-PAC Basic Mobility - Inpatient   How much help is needed turning from your back to your side while in a flat bed without using bedrails?: A Little  How much help is needed moving from lying on your back to sitting on the side of a flat bed without using bedrails?: A Little  How much help is needed moving to and from a bed to a chair?: A Little  How much help is needed standing up from a  chair using your arms?: A Little  How much help is needed walking in hospital room?: A Little  How much help is needed climbing 3-5 steps with a railing?: A Little  AM-PAC Inpatient Mobility Raw Score : 18  AM-PAC Inpatient T-Scale Score : 43.63  Mobility Inpatient CMS 0-100% Score: 46.58  Mobility Inpatient CMS G-Code Modifier : CK    SUBJECTIVE:   Mr. Vitanza states, "I can't breath.  I need oxygen".  Patient on 3L with sats at 95%.  RT called and coming to check on patient.    Social/Functional Lives With: Alone  Type of Home: Senior housing apartment  Home Layout: One level  Home Access: Level entry, Occupational psychologist Shower/Tub: Pension scheme manager: Midwife: Grab bars in shower, Grab bars around toilet  Bathroom Accessibility: Accessible  Home Equipment: Engineer, production Help From: Family  Prior Level of Assist for ADLs: Independent  Prior Level of Assist for [employer]: Independent  Prior Level of Assist for Transfers: Independent  Occupation: Retired    OBJECTIVE:     PAIN: VITALS / O2: PRECAUTION / LINES / DRAINS:   Pre Treatment: no complaints         Post Treatment: no complaints Vitals        Oxygen  O2 Device: Nasal cannula  O2 Flow Rate (L/min): 3 L/min  SpO2: 95 %   IV    RESTRICTIONS/PRECAUTIONS:                    GROSS EVALUATION:  Intact Impaired (Comments):   AROM []   Generally decreased, functional    PROM []     Strength []   Generally decreased, functional - ambulatory with RW   Balance []  Sitting - Static: Good  Sitting - Dynamic: Fair, +  Standing - Static: Fair, +  Standing - Dynamic: Fair   Posture []  Forward Head  Rounded Shoulders  Thoracic Kyphosis   Sensation []      Coordination [x]       Tone []      Edema []     Activity Tolerance []   Generally decreased     []       COGNITION/  PERCEPTION: Intact Impaired (Comments):   Orientation [x]      Vision [x]      Hearing []   B hearing aids   Cognition  [x]        MOBILITY: I Mod I S SBA CGA Min Mod Max Total  NT  x2 Comments:   Bed Mobility    Rolling []  []  []  []  []  []  []  []  []  []  []     Supine to Sit []  []  []  [x]  []  []  []  []  []  []  []   Scooting []  []  []  [x]  []  []  []  []  []  []  []     Sit to Supine []  []  []  []  []  []  []  []  []  []  []     Transfers    Sit to Stand []  []  []  [x]  []  []  []  []  []  []  []     Bed to Chair []  []  []  [x]  []  []  []  []  []  []  []     Stand to Sit []  []  []  [x]  []  []  []  []  []  []  []      []  []  []  []  []  []  []  []  []  []  []     I=Independent, Mod I=Modified Independent, S=Supervision, SBA=Standby Assistance, CGA=Contact Guard Assistance,   Min=Minimal Assistance, Mod=Moderate Assistance, Max=Maximal Assistance, Total=Total Assistance, NT=Not Tested    GAIT: I Mod I S SBA CGA Min Mod Max Total  NT x2 Comments:   Level of Assistance []  []  []  [x]  []  []  []  []  []  []  []     Distance 10 feet    DME Rolling Walker    Gait Quality Decreased cadence , Decreased step length, and Trunk flexion    Weightbearing Status      Stairs      I=Independent, Mod I=Modified Independent, S=Supervision, SBA=Standby Assistance, CGA=Contact Guard Assistance,   Min=Minimal Assistance, Mod=Moderate Assistance, Max=Maximal Assistance, Total=Total Assistance, NT=Not Tested    PLAN:   FREQUENCY AND DURATION: Daily for duration of hospital stay or until stated goals are met, whichever comes first.    THERAPY PROGNOSIS: Good    PROBLEM LIST:   (Skilled intervention is medically necessary to address:)  Decreased ADL/Functional Activities  Decreased Activity Tolerance  Decreased AROM/PROM  Decreased Balance  Decreased Gait Ability  Decreased Strength  Decreased Transfer Abilities INTERVENTIONS PLANNED:   (Benefits and precautions of physical therapy have been discussed with the patient.)  Therapeutic Activity  Therapeutic Exercise/HEP  Gait Training  Education       TREATMENT:       TREATMENT:   Therapeutic Activity (24 Minutes): Therapeutic activity included Supine to Sit, Scooting, Transfer Training, Ambulation on level ground, Sitting balance , and Standing  balance to improve functional Activity tolerance, Balance, Mobility, Strength, and ROM.    TREATMENT GRID:  N/A    AFTER TREATMENT PRECAUTIONS: Bed/Chair Locked, Call light within reach, Chair, and Needs within reach    INTERDISCIPLINARY COLLABORATION:  RN/ PCT, OT/ COTA, and RT    EDUCATION: Education Given To: Patient  Education Provided: Role of Therapy;Plan of Care  Education Method: Verbal  Education Outcome: Verbalized understanding  Educated patient and/or family/caregiver on the following: Home Safety    TIME IN/OUT:  Time In: 1446  Time Out: 1510  Minutes: 24    Georgeanna Harrison, PT

## 2023-07-06 NOTE — Progress Notes (Signed)
GOALS:  LTG: Patient will maintain adequate hydration/nutrition with optimum safety and efficiency of swallowing function with PO intake without overt signs or symptoms of aspiration for the highest appropriate diet level.  STG: DOWNGRADED 07/06/23  Patient will consume minced and moist textures and thin liquids (no straws) without overt signs or symptoms of airway compromise.  Patient will safely ingest diet trials during therapeutic feedings with SLP without overt signs or symptoms of respiratory compromise in efforts to advance diet.   Patient will complete a Modified Barium Swallow study to fully assess physiology and anatomy of the swallow and determine safest appropriate diet and/or rehabilitation strategies, as medically indicated. COMPLETED 07/06/23    SPEECH LANGUAGE PATHOLOGY: Modified Barium Swallow Study   Initial Assessment    Acknowledge Order  I  Therapy Time  I   Charges     I  Rehab Caseload Tracker  NAME: Paul Medina  DOB: Oct 29, 1930  MRN: [id number]    ADMISSION DATE: 07/04/2023  PRIMARY DIAGNOSIS: Severe sepsis (HCC)    ICD-10: Treatment Diagnosis: R13.12 Dysphagia, Oropharyngeal Phase    RECOMMENDATIONS   Diet:    Minced and Moist  Thin Liquids- NO STRAWS    Medication: whole floated in puree or applesauce and crushed in puree or applesauce, as permitted by pharmacy   Compensatory Swallowing Strategies:   Upright for all PO  Set-up assistance  Swallow with intent  Slow rate of PO intake  Small bites and sips  Alternate liquids/solids  No straws  Liquid wash  Remain upright for 20-30 min after any PO   Therapeutic Intervention:   Patient/family education  Instrumental swallow assessment  Dysphagia treatment   Patient continues to require skilled intervention:  Yes. Recommend ongoing speech therapy services during this hospitalization.     Anticipated Discharge Needs: Ongoing speech therapy is recommended at next level of care.      ASSESSMENT    Patient presents with mild to moderate  oropharyngeal dysphagia characterized by diminished tongue base retraction and increased fatigue as study progressed resulting in increased vallecular residue.     Oral phase marked by prolonged prep and transfer with mixed consistency fruit cup and course crackers in the setting of fatigue. Pharyngeal phase marked by vallecular residue after all swallows requiring double swallow to facilitate clearance. Increased residue observed with mildly thick liquid (nectar) and course crackers, though partial clearance of vallecular residue was observed with use of liquid wash in single sips. Patient did experience single aspiration event after the swallow and single instance of deep penetration during the swallow when consuming thin liquids via straw, however reactive cough was elicited which cleared aspirated/penetrated bolus from trachea.     Recommend minced and moist solids per patient preference, thin liquids without straws, and medications whole in puree or crushed in puree. Patient with known history of esophageal dysphagia. CP bar was observed, however this does not obstruct flow of bolus.    GENERAL    Subjective: Received in holding bay, agreeable to skilled assessment. Patient reporting he feels poorly today, but unable to specifically state pain.     Reason for Consult: concern for aspiration.     History of Present Injury/Illness: [name]  has a past medical history of Arthritis, Asthma, Chronic bronchitis (HCC), Hypertension, and Lung cancer (HCC).. He also  has no past surgical history on file.  Precautions/Allergies: Ciprofloxacin, Doxycycline, Lisinopril, Other, Sulfa antibiotics, Sulfasalazine, and Sulfur     Observations:  Alertness: Alert  Drowsy  Voice: [facility]  Speech: Intelligible  Expressive Language: Fluent and Able to communicate wants and needs  Receptive Language: Answers yes/no questions, Follows basic commands, and Appropriately responds to questions  Cognition: Appropriately attends to  clinician    Prior Dysphagia History: Seen last week with recommendations for minced and moist solids, thin liquids due to patient report of esophageal dysphagia.   Prior Instrumental Assessment: none    Current Diet:  regular  thin liquids     Respiratory Status: Nasal Cannula    Pain:  Patient does not c/o pain  Pain intervention: Repositioned  Pain response: Patient satisfied    OBJECTIVE      Modified Barium Swallow Study was performed in the radiology suite using c-arm with Mr. Magnussen in the lateral plane.  Radiologist: Dr. Pollyann Kennedy  Fluoroscopy completed by: Dr. Deri Fuelling Motor Assessment  Labial: no impairment  Lingual: no impairment  Dentition: natural  Oral Hygiene: clean and dry  Vocal quality: WFL  Volitional cough: present  Volitional swallow: present    PO trials  Patient was presented with trials of thin liquids (by spoon, cup sip, cup gulp, straw sip, and serial swallows), mildly-thick liquids (by spoon and cup sip), applesauce, mixed consistency, and cracker.    Oral phase:  prolonged bolus manipulation  slow oral transit  premature spillage to pharynx pre-swallow  piecemeal deglutition    Pharyngeal phase:  Swallows of thin liquids were delayed and triggered at base of tongue and valleculae. There was deep laryngeal penetration observed after the swallow with residue from liquid wash spilling to the inferior side of the epiglottis that entered the laryngeal vestibule and was cleared by spontaneous cough response. There was Viyaan aspiration observed after the swallow on residue remaining in pharyngeal cavity that entered the laryngeal vestibule, reached the vocal folds, and was cleared by spontaneous cough response. Residue was observed after the swallow in valleculae and in pyriform sinuses that was mild. Residue cleared with double swallow.  Swallows of mildly-thick liquids were delayed and triggered at valleculae. There was transient laryngeal penetration observed after the swallow with  residue spilling on the posterior side of the epiglottis that entered the laryngeal vestibule and was cleared by spontaneous cough response. Residue was observed after the swallow at base of tongue, in valleculae, and in pyriform sinuses that was mild to moderate. Residue did not clear despite attempts at double swallow.  Swallows of pudding were timely and triggered at valleculae. No laryngeal penetration or aspiration was observed. Residue was observed after the swallow in valleculae that was mild to moderate. Residue cleared with double swallow and liquid rinse. Of note, chin tuck attempted to facilitate improvement in pharyngeal constriction without success.  Swallows of mixed consistencies were piecemealed and triggered at valleculae. No laryngeal penetration or aspiration was observed. Residue was observed after the swallow in valleculae that was mild. Residue cleared with double swallow and liquid rinse.  Swallows of cracker were timely and triggered at valleculae. No laryngeal penetration or aspiration was observed. Residue was observed after the swallow in valleculae that was mild to moderate. Residue cleared with double swallow and liquid rinse.    Pharyngeal characteristics:  reduced and decreased retraction of base of tongue  reduced and decreased hyolaryngeal elevation/excursion  Partial and inconsistent epiglottic inversion  reduced and decreased constriction of posterior pharyngeal wall  incoordinated laryngeal closure    Aspiration/Penetration Scale:  6 (Aspiration. Contrast passes below the folds/glottis, but is cleared.)    Upper Esophageal Screen:  unable to rule out an esophageal component of dysphagia  CP bar observed  *Distal esophagus not assessed due to limitations of modified barium swallow study.       Dysphagia Outcome and Severity Scale (DOSS)  Score: 4 Description   []  7 Normal in all situations   []  6 Within Functional Limits/ Modified independent   []  5 Mild Dysphagia: Distant  Supervision. May need one diet consistency      restricted.   [x]  4 Mild-Moderate Dysphagia: Intermittent supervision/cuing. One-two diet    consistencies restricted.   []  3 Moderate Dysphagia: Total assistance, supervision, or strategies.       Two or more diet consistencies restricted.   []  2 Moderate-Severe Dysphagia: Maximum assistance or maximum use     of strategies with partial po intake   []  1 Severe dysphagia- NPO. Unable to tolerate any po safely       PLAN    Duration/Frequency: Continue to follow patient 3x/week for duration of hospitalization and/or until goals met    Rehabilitation Potential For Stated Goals: Good    Interdisciplinary Collaboration: MD/ PA/ NP  and RN/ PCT    Medical Necessity    Skilled intervention continues to be required due to patient still consuming a modified diet and medical complications.    Education:   Patient and/or family/caregiver educated on Results of evaluation, Role of speech therapy, Diet recommendations, SLP recommendations, and SLP plan  Education response: Verbalizes understanding and Needs reinforcement    Safety:   Call light within reach  In Bed  RN notified   Family/visitors at bedside    Therapy Time:  Time In: 0930  Time Out: 1000  Minutes: 30    Denver Faster, M.S., CCC-SLP   07/06/2023 11:14 AM

## 2023-07-06 NOTE — Care Coordination-Inpatient (Addendum)
Patient discussed at rounds. Admitted 2/5 to 2/10 with COVID, AKI, new onset afib. Discharged home with [facility] services with [facility], PT recommended rehab. Readmitted 2/11 with SOB. PT recommending rehab again, patient requiring stand-by to contact guard assist and walking 25 ft with RW. Patient lives alone, but receives help from family.     CM to bedside to discuss rehab recs. Patient states she is not strong enough to consider rehab at this time. CM discussed that patient would go to rehab to regain strength. Patient again declines, stating he cannot tolerate rehab at this time. Per discussion in rounds, patient has a non-productive cough, continues IV abx, and supplemental oxygen via nasal cannula. Anticipated DC in 48 hours.     CM can touch base with patient regarding rehab again tomorrow, but anticipate he will return home with [facility] PT with [facility] and family support.     Addendum: Discussed patient with PT. PT questions if pulmonology referral could be beneficial. Discussed with MD who states if nebs don't work will consider pulmonology tomorrow.

## 2023-07-07 LAB — CBC WITH AUTO DIFFERENTIAL
Basophils %: 0.7 % (ref 0.0–2.0)
Basophils Absolute: 0.09 10*3/uL (ref 0.00–0.20)
Eosinophils %: 0.1 % — ABNORMAL LOW (ref 0.5–7.8)
Eosinophils Absolute: 0.01 10*3/uL (ref 0.00–0.80)
Hematocrit: 30.9 % — ABNORMAL LOW (ref 41.1–50.3)
Hemoglobin: 10.1 g/dL — ABNORMAL LOW (ref 13.6–17.2)
Immature Granulocytes %: 7.1 % — ABNORMAL HIGH (ref 0.0–5.0)
Immature Granulocytes Absolute: 0.94 10*3/uL — ABNORMAL HIGH (ref 0.0–0.5)
Lymphocytes %: 4.6 % — ABNORMAL LOW (ref 13.0–44.0)
Lymphocytes Absolute: 0.61 10*3/uL (ref 0.50–4.60)
MCH: 29.4 pg (ref 26.1–32.9)
MCHC: 32.7 g/dL (ref 31.4–35.0)
MCV: 90.1 fL (ref 82.0–102.0)
MPV: UNDETERMINED fL (ref 9.4–12.3)
Monocytes %: 18.1 % — ABNORMAL HIGH (ref 4.0–12.0)
Monocytes Absolute: 2.39 10*3/uL — ABNORMAL HIGH (ref 0.10–1.30)
Neutrophils %: 69.4 % (ref 43.0–78.0)
Neutrophils Absolute: 9.16 10*3/uL — ABNORMAL HIGH (ref 1.70–8.20)
Platelets: 98 10*3/uL — ABNORMAL LOW (ref 150–450)
RBC: 3.43 M/uL — ABNORMAL LOW (ref 4.23–5.6)
RDW: 15.2 % — ABNORMAL HIGH (ref 11.9–14.6)
WBC: 13.2 10*3/uL — ABNORMAL HIGH (ref 4.3–11.1)
nRBC: 0 10*3/uL (ref 0.0–0.2)

## 2023-07-07 LAB — BASIC METABOLIC PANEL W/ REFLEX TO MG FOR LOW K
Anion Gap: 17 mmol/L — ABNORMAL HIGH (ref 7–16)
BUN: 44 mg/dL — ABNORMAL HIGH (ref 8–23)
CO2: 18 mmol/L — ABNORMAL LOW (ref 20–29)
Calcium: 8.7 mg/dL — ABNORMAL LOW (ref 8.8–10.2)
Chloride: 104 mmol/L (ref 98–107)
Creatinine: 1.64 mg/dL — ABNORMAL HIGH (ref 0.80–1.30)
Est, Glom Filt Rate: 39 mL/min/{1.73_m2} — ABNORMAL LOW (ref >60)
Glucose: 106 mg/dL — ABNORMAL HIGH (ref 70–99)
Potassium: 4.8 mmol/L (ref 3.5–5.1)
Sodium: 139 mmol/L (ref 136–145)

## 2023-07-07 MED FILL — GUAIFENESIN ER 600 MG PO TB12: 600 MG | ORAL | Qty: 1

## 2023-07-07 MED FILL — ELIQUIS 2.5 MG PO TABS: 2.5 MG | ORAL | Qty: 1

## 2023-07-07 MED FILL — DOXAZOSIN MESYLATE 4 MG PO TABS: 4 MG | ORAL | Qty: 1

## 2023-07-07 MED FILL — CARVEDILOL 6.25 MG PO TABS: 6.25 MG | ORAL | Qty: 1

## 2023-07-07 MED FILL — IPRATROPIUM-ALBUTEROL 0.5-2.5 (3) MG/3ML IN SOLN: 0.5-2.533 (3) MG/3ML | RESPIRATORY_TRACT | Qty: 3

## 2023-07-07 MED FILL — SODIUM CHLORIDE 3 % IN NEBU: 3 % | RESPIRATORY_TRACT | Qty: 4

## 2023-07-07 MED FILL — ROSUVASTATIN CALCIUM 5 MG PO TABS: 5 MG | ORAL | Qty: 2

## 2023-07-07 MED FILL — PIPERACILLIN SOD-TAZOBACTAM SO 3.375 (3-0.375) G IV SOLR: 3.3753-0.375 (3-0.375) g | INTRAVENOUS | Qty: 3375

## 2023-07-07 MED FILL — FUROSEMIDE 20 MG PO TABS: 20 MG | ORAL | Qty: 1

## 2023-07-07 MED FILL — PANTOPRAZOLE SODIUM 40 MG PO TBEC: 40 MG | ORAL | Qty: 1

## 2023-07-07 MED FILL — PREDNISONE 20 MG PO TABS: 20 MG | ORAL | Qty: 2

## 2023-07-07 MED FILL — ARFORMOTEROL TARTRATE 15 MCG/2ML IN NEBU: 152 MCG/2ML | RESPIRATORY_TRACT | Qty: 2

## 2023-07-07 MED FILL — SODIUM BICARBONATE 650 MG PO TABS: 650 MG | ORAL | Qty: 1

## 2023-07-07 NOTE — Progress Notes (Deleted)
TRANSFER - OUT REPORT:    Verbal report given to Jimmie Molly, RN on Paul Medina  being transferred to [facility] [place] [facility] for routine progression of patient care       Report consisted of patient's Situation, Background, Assessment and   Recommendations(SBAR).     Information from the following report(s) Nurse Handoff Report, Index, Adult Overview, Surgery Report, Intake/Output, MAR, Recent Results, Med Rec Status, Cardiac Rhythm  , Procedure Verification, Quality Measures, Neuro Assessment, and Event Log was reviewed with the receiving nurse.           Lines:   Peripheral IV 06/28/23 Left;Anterior Forearm (Active)   Site Assessment Clean, dry & intact 07/05/23 2321   Line Status Intermittent infusions 07/05/23 2321   Line Care Connections checked and tightened 07/05/23 2321   Phlebitis Assessment No symptoms 07/05/23 2321   Infiltration Assessment 0 07/05/23 2321   Alcohol Cap Used Yes 07/05/23 2321   Dressing Status Clean, dry & intact 07/05/23 2321   Dressing Type Transparent 07/05/23 2321        Opportunity for questions and clarification was provided.      Patient to be transported with oxygen 2L Nasal Canula.

## 2023-07-07 NOTE — Plan of Care (Signed)
 Problem: Discharge Planning  Goal: Discharge to home or other facility with appropriate resources  Outcome: Progressing     Problem: Safety - Adult  Goal: Free from fall injury  Outcome: Progressing     Problem: ABCDS Injury Assessment  Goal: Absence of physical injury  Outcome: Progressing     Problem: Respiratory - Adult  Goal: Achieves optimal ventilation and oxygenation  Outcome: Progressing

## 2023-07-07 NOTE — Care Coordination-Inpatient (Signed)
SW CM completed chart review for continued stay. LOS 3 days. Pt admitted with severe sepsis. Per MD, 3L/Min 02 NC (none at baseline); difficulty coughing, IV ABX, respiratory consulted IP. Pt not medically stable for discharge.    PT/OT recommending STR at discharge and pt declined. Pt previously declined last admission and discharged home with family, [facility] transport, and HH.    CM sent resumption of care order to [facility] PT/OT/RN services with SLP added.    Follow 02 for potential home 02 need.    Anticipate pt will require [facility] transport at discharge.    DC/POC to return home via [facility] with [facility] PT/OT/RN/SLP services when medically stable.    CM team to continue to follow for any needs that may arise.     Gardiner Fanti, MSW, LBSW  Social Work Case Software engineer. [place]

## 2023-07-07 NOTE — Progress Notes (Addendum)
ACUTE PHYSICAL THERAPY GOALS:   (Developed with and agreed upon by patient and/or caregiver.)      LTG:  (1.)Mr. Paul Medina will move from supine to sit and sit to supine  in bed with INDEPENDENT within 7 treatment day(s).    (2.)Mr. Paul Medina will transfer from bed to chair and chair to bed with SUPERVISION using the least restrictive device within 7 treatment day(s).    (3.)Mr. Paul Medina will ambulate with SUPERVISION for 200 feet with the least restrictive device within 7 treatment day(s).  ________________________________________________________________________________________________      PHYSICAL THERAPY Daily Note and PM  (Link to Caseload Tracking: PT Visit Days : 3  Acknowledge Orders  Time In/Out  PT Charge Capture  Rehab Caseload Tracker    Paul Medina is a 89 y.o. male   PRIMARY DIAGNOSIS: Severe sepsis (HCC)  Severe sepsis (HCC) [A41.9, R65.20]  Pneumonia of right lung due to infectious organism, unspecified part of lung [J18.9]  Sepsis, due to unspecified organism, unspecified whether acute organ dysfunction present (HCC) [A41.9]       Reason for Referral: Generalized Muscle Weakness (M62.81)  Difficulty in walking, Not elsewhere classified (R26.2)  Inpatient: Payor: [facility] [employer] / Plan: [facility] [employer] / Product Type: *No Product type* /     ASSESSMENT:     REHAB RECOMMENDATIONS:   Recommendation to date pending progress:  Setting:  Short-term Rehab    Equipment:    None     ASSESSMENT:  Mr. Paul Medina admitted with above diagnoses.  He presented to the ER with complaints of cough and shortness of breath.  CT showed worsening R-sided pneumonia and worsening mucus plugging.  Patient was hospitalized at [facility] 2/5-2/10 with a-fib and pneumonia.  Short term rehab was recommended during last admission but patient declined and d/c'd home with HH orders.  Patient reports he was told he would have to be in a facility for 2 weeks for rehab and he did not want that.  Explained to patient that there was no  way for [place] to know how long he would need to stay for rehab but he reports he still plans to return home with [facility].  Patient currently demonstrates decreased strength, balance, mobility, and tolerance for activity.  He would benefit from therapy to address these deficits.  He is on 3L O2 at this time but did not qualify for O2 2 days ago when he was d/c'd from [facility].  Patient educated on need for mobility/time out of bed to improve strength, mobility, and ability to safely return home.       2/14 : Today pt needed much encouragement to participate with therapy. So  prolonged sitting with wt shifting & standing with wt shifting was targeted to build endurance & core stability since pt stated that he was too weak to walk. This pt needs to be out of be for all meals &  should more initiative to move his limbs more on his own along with keeping his trunk more elevated for better respiration & to clear his lungs. PT will follow & work toward goals noted.     [employer] AM-PACT "6 Clicks" Basic Mobility Inpatient Short Form  AM-PAC Basic Mobility - Inpatient   How much help is needed turning from your back to your side while in a flat bed without using bedrails?: A Little  How much help is needed moving from lying on your back to sitting on the side of a flat bed without using bedrails?: A Little  How much help is needed moving to and from a bed to a chair?: A Little  How much help is needed standing up from a chair using your arms?: A Little  How much help is needed walking in hospital room?: A Little  How much help is needed climbing 3-5 steps with a railing?: A Little  AM-PAC Inpatient Mobility Raw Score : 18  AM-PAC Inpatient T-Scale Score : 43.63  Mobility Inpatient CMS 0-100% Score: 46.58  Mobility Inpatient CMS G-Code Modifier : CK    SUBJECTIVE:   Mr. Paul Medina states, " I am so weak "    Social/Functional Lives With: Alone  Type of Home: Senior housing apartment  Home Layout: One level  Home Access: Level  entry, Occupational psychologist Shower/Tub: Pension scheme manager: Midwife: Grab bars in shower, Grab bars around toilet  Bathroom Accessibility: Accessible  Home Equipment: Engineer, production Help From: Family  Prior Level of Assist for ADLs: Independent  Prior Level of Assist for [employer]: Independent  Prior Level of Assist for Transfers: Independent  Occupation: Retired    OBJECTIVE:     PAIN: VITALS / O2: PRECAUTION / LINES / DRAINS:   Pre Treatment:   Pain Assessment: None - Denies Pain      Post Treatment: 0/10 Vitals NT       Oxygen   3 liters   IV    RESTRICTIONS/PRECAUTIONS:  Restrictions/Precautions: Isolation, Fall Risk, Bed Alarm                 GROSS EVALUATION:  Intact Impaired (Comments):   AROM []   Generally decreased,  but functional    PROM []     Strength []   Generally decreased, but functional    Balance []      Posture []  Forward Head  Rounded Shoulders  Thoracic Kyphosis   Sensation [x]   grossly   Coordination []    Generally decreased, but functional   Tone []   Hypotonus throughout   Edema []     Activity Tolerance []   poor    []       COGNITION/  PERCEPTION: Intact Impaired (Comments):   Orientation [x]      Vision [x]      Hearing []   B hearing aids   Cognition  [x]        MOBILITY: I Mod I S SBA CGA Min Mod Max Total  NT x2 Comments:   Bed Mobility    Rolling []  []  []  [x]  []  []  []  []  []  []  []     Supine to Sit []  []  []  [x]  []  []  []  []  []  []  []     Scooting []  []  []  [x]  []  []  []  []  []  []  []     Sit to Supine []  []  []  [x]  []  []  []  []  []  []  []     Transfers    Sit to Stand []  []  []  []  [x]  []  []  []  []  []  []  With walker   Bed to Chair []  []  []  []  []  []  []  []  []  []  []     Stand to Sit []  []  []  []  [x]  []  []  []  []  []  []      []  []  []  []  []  []  []  []  []  []  []     I=Independent, Mod I=Modified Independent, S=Supervision, SBA=Standby Assistance, CGA=Contact Guard Assistance,   Min=Minimal Assistance, Mod=Moderate Assistance, Max=Maximal Assistance, Total=Total Assistance, NT=Not  Tested    GAIT: I Mod I S SBA CGA Min Mod Max Total  NT x2 Comments:  Level of Assistance []  []  []  []  []  []  []  []  []  [x]  []     Distance   N/A    DME N/A    Gait Quality N/A    Weightbearing Status N/A    Stairs  N/A    I=Independent, Mod I=Modified Independent, S=Supervision, SBA=Standby Assistance, CGA=Contact Guard Assistance,   Min=Minimal Assistance, Mod=Moderate Assistance, Max=Maximal Assistance, Total=Total Assistance, NT=Not Tested    PLAN:   FREQUENCY AND DURATION: Daily for duration of hospital stay or until stated goals are met, whichever comes first.    THERAPY PROGNOSIS: Good    PROBLEM LIST:   (Skilled intervention is medically necessary to address:)  Decreased ADL/Functional Activities  Decreased Activity Tolerance  Decreased AROM/PROM  Decreased Balance  Decreased Gait Ability  Decreased Strength  Decreased Transfer Abilities INTERVENTIONS PLANNED:   (Benefits and precautions of physical therapy have been discussed with the patient.)  Therapeutic Activity  Therapeutic Exercise/HEP  Gait Training  Education       TREATMENT:       TREATMENT:   Therapeutic Activity (23 Minutes): Therapeutic activity included review of POC & expected activity, repeated bed mobility, repeated scooting & wt shifting EOB, transfers, standing balance activity with walker performing wt shifting, prolonged sitting balance EOB for endurance & core stability, positioning pt back in bed to improve functional Activity tolerance, Balance, Coordination, Mobility, Strength, and ROM.    TREATMENT GRID:  N/A    AFTER TREATMENT PRECAUTIONS: Alarm Activated, Bed/Chair Locked, Call light within reach, Chair, Needs within reach, and RN notified    INTERDISCIPLINARY COLLABORATION:  RN/ PCT    EDUCATION: Education Given To: Patient  Education Provided: Role of Therapy;Plan of Care;Transfer Training;Mobility Training;IADL Safety;Equipment;Fall Prevention Strategies  Education Method: Demonstration;Verbal;Teach Back  Education Outcome:  Continued education needed  Educated patient and/or family/caregiver on the following: Home Safety    TIME IN/OUT:  Time In: 1717  Time Out: 1740  Minutes: 23    Rhae Lerner. Averee Harb, PT

## 2023-07-07 NOTE — Progress Notes (Signed)
 Hospitalist Progress Note   Admit Date:  07/04/2023 11:46 AM   Name:  Paul Medina   Age:  89 y.o.  Sex:  male  DOB:  12-Dec-1930   MRN:  [id number]   Room:  357/01    Presenting/Chief Complaint: Shortness of Breath     Reason(s) for Admission: Severe sepsis (HCC) [A41.9, R65.20]  Pneumonia of right lung due to infectious organism, unspecified part of lung [J18.9]  Sepsis, due to unspecified organism, unspecified whether acute organ dysfunction present [facility]) [A41.9]     Hospital Course:   Paul Medina is a 89 y.o. male with medical history of A-fib, CHF with reduced ejection fraction, hypertension, hyperlipidemia, CAD, TIA, left eye blindness, OSA, CKD stage III, asthma who presented with worsening cough and shortness of breath.  Of note patient was recently hospitalized at [facility] [place] and discharged yesterday.  Patient was hospitalized during that visit for A-fib with RVR and community-acquired pneumonia, asthma exacerbation, and COVID-19 pneumonia.  Patient was discharged home to his studio apartment where he lives alone but has children in the area.  He states that his shortness of breath got worse and he was not eating or drinking.  During last hospitalization patient was evaluated by speech therapy as well as GI for dysphagia.  No EGD was recommended by GI at that time.  Speech therapy recommended minced and moist diet with thin liquids.  Patient states that he is still been having dysphagia and occasionally chokes on food/liquids.     On arrival to the ED notable labs include lactic acid 3.7 down trended to 0.5, Pro-Cal 0.08, WBCs 12 (3 at discharge on 2/10), creatinine 1.46 (near baseline).  CT chest PE protocol shows no PE but does show worsening right-sided pneumonia with mild improvement of left lung from previous exam, worsening mucous plugging in lower lobes raising concern for aspiration pneumonia/pneumonitis.  Patient started on Zyvox and Zosyn in ED, stop Zyvox on 2/12 as MRSA  swab negative.  Blood cultures no growth to date.  Speech therapy recommended regular consistency and thin liquids.      Subjective & 24hr Events:   Evaluated at bedside.  Lying in bed.  Remains on nasal cannula.  Appears overall weak.  States he feels similar to yesterday.  Still having difficulty coughing up mucus.      Assessment & Plan:     Severe sepsis:  -As evidenced by tachypnea, leukocytosis, lactic acid 3.7 in setting of hospital-acquired pneumonia  -Status post Zyvox on 2/12 as MRSA nares negative  -Continue Zosyn  -Blood cultures no growth to date  -UA unremarkable     Hospital-acquired pneumonia:  -Recent hospital admission for A-fib with RVR, community-acquired pneumonia, asthma exacerbation, and COVID-19  -Concern for aspiration pneumonia, speech therapy evaluated and diet ordered  -Chest CT PE protocol: No PE, pneumonia (right greater than left), mucous plugging with concern for aspiration  -Status post Zyvox, stopped on 2/12 as MRSA nares negative  -Continue Zosyn  -Blood cultures no growth to date    Mucous plugging:  -As seen on CT imaging  -Mucinex twice daily  -Incentive spirometer  -Hypertonic saline nebulizer started 2/13, continue  -Continue AccuPAP  -DuoNebs every 4 hours while awake  -Continue Brovana       Atrial fibrillation, not in RVR:  -Recent hospital admission with RVR  -Continue home Eliquis, Coreg     Heart failure with reduced ejection fraction:  -Echocardiogram 06/29/2023: EF 30-35%, grade 2 diastolic dysfunction  -Seen by  cardiology at last hospital admission  -Continue home Coreg  -Holding home losartan, SGLT2, Aldactone given severe sepsis, will restart when able  -Will need repeat outpatient echocardiogram in 3 months    Left great toe pain, resolved:  -Left foot x-ray shows no fractures, hammertoe deformities  -Low suspicion for gout given exam, will monitor     History of TIA:  -Continue home aspirin 81 mg daily     History of B-cell lymphoma:  -Continue outpatient follow-up  with Dr. Rulon Abide at [facility] following discharge     Obstructive sleep apnea:  -Respiratory consult for CPAP while inpatient     CAD:  -Continue home aspirin, Coreg  -Will restart home losartan, SGLT2, and Aldactone when able, held in setting of severe sepsis     Hypertension:  -Holding antihypertensives in setting of severe sepsis     Hyperlipidemia:  -Continue home Crestor     BPH:  -Continue home Cardura      Anticipated Discharge Arrangements:   STR    PT/OT evals ordered?  Therapy evals ordered  Diet:  ADULT DIET; Dysphagia - Minced and Moist; No Drinking Straws  ADULT ORAL NUTRITION SUPPLEMENT; Breakfast, Lunch, Dinner; Educational psychologist Protein Oral Supplement  VTE prophylaxis: Already on anticoagulation  Code status: DNR      Non-peripheral Lines and Tubes (if present):          Telemetry (if present):  Cardiac/Telemetry Monitor On: No        Hospital Problems:  Principal Problem:    Severe sepsis (HCC)  Active Problems:    Chronic heart failure with mildly reduced ejection fraction (HFmrEF, 41-49%) (HCC)    HTN (hypertension)    HLD (hyperlipidemia)    Coronary atherosclerosis of native coronary artery    TIA (transient ischemic attack)    OSA (obstructive sleep apnea)    Stage 3 chronic kidney disease (HCC)    Atrial fibrillation (HCC)    Hospital-acquired pneumonia  Resolved Problems:    * No resolved hospital problems. *      Objective:   Patient Vitals for the past 24 hrs:   Temp Pulse Resp BP SpO2   07/07/23 1121 -- 98 20 -- 90 %   07/07/23 0753 98.4 F (36.9 C) 82 18 126/71 94 %   07/07/23 0733 -- 80 -- -- 90 %   07/06/23 2004 98.2 F (36.8 C) 81 19 (!) 106/59 95 %   07/06/23 1940 -- 82 17 -- 96 %   07/06/23 1716 -- 84 -- 111/63 93 %       Oxygen Therapy  SpO2: 90 %  Pulse via Oximetry: 83 beats per minute  Pulse Oximeter Device Mode: Intermittent  Pulse Oximeter Device Location: Left, Finger  O2 Device: Nasal cannula  O2 Flow Rate (L/min): 3 L/min    Estimated body mass index is 29.05 kg/m  as calculated from the following:    Height as of this encounter: 1.676 m (5\' 6" ).    Weight as of this encounter: 81.6 kg (180 lb).    Intake/Output Summary (Last 24 hours) at 07/07/2023 1534  Last data filed at 07/07/2023 1455  Gross per 24 hour   Intake 20 ml   Output 450 ml   Net -430 ml         Physical Exam:   General:           Elderly, lying in bed, appears weak  Head:  Normocephalic, atraumatic  Eyes:               Sclerae appear normal.   ENT:                Nares appear normal.  Moist oral mucosa  Neck:               No restricted ROM.  Trachea midline   CV:                  RRR.  No m/r/g.   Lungs:             Rales noted bilaterally.  Symmetric expansion.  Weak wet cough noted, nasal cannula oxygen  Abdomen:        Soft, nontender, nondistended.  Extremities:     No cyanosis or clubbing.  No edema  Skin:                No rashes.  Normal coloration.   Warm and dry.    Neuro:             CN II-XII grossly intact.  Moves all extremities  Psych:             Normal mood and affect.       I have personally reviewed labs and tests:  Recent Labs:  Recent Results (from the past 48 hour(s))   CBC with Auto Differential    Collection Time: 07/06/23  6:38 AM   Result Value Ref Range    WBC 10.9 4.3 - 11.1 K/uL    RBC 3.34 (L) 4.23 - 5.6 M/uL    Hemoglobin 9.7 (L) 13.6 - 17.2 g/dL    Hematocrit 16.1 (L) 41.1 - 50.3 %    MCV 86.5 82.0 - 102.0 FL    MCH 29.0 26.1 - 32.9 PG    MCHC 33.6 31.4 - 35.0 g/dL    RDW 09.6 (H) 04.5 - 14.6 %    Platelets 135 (L) 150 - 450 K/uL    MPV 12.5 (H) 9.4 - 12.3 FL    nRBC 0.00 0.0 - 0.2 K/uL    Neutrophils % 68.5 43.0 - 78.0 %    Lymphocytes % 4.4 (L) 13.0 - 44.0 %    Monocytes % 19.1 (H) 4.0 - 12.0 %    Eosinophils % 0.0 (L) 0.5 - 7.8 %    Basophils % 0.4 0.0 - 2.0 %    Immature Granulocytes % 7.6 (H) 0.0 - 5.0 %    Neutrophils Absolute 7.47 1.70 - 8.20 K/UL    Lymphocytes Absolute 0.48 (L) 0.50 - 4.60 K/UL    Monocytes Absolute 2.08 (H) 0.10 - 1.30 K/UL    Eosinophils  Absolute 0.00 0.00 - 0.80 K/UL    Basophils Absolute 0.04 0.00 - 0.20 K/UL    Immature Granulocytes Absolute 0.83 (H) 0.0 - 0.5 K/UL    RBC Comment SLIGHT  ANISOCYTOSIS + POIKILOCYTOSIS        WBC Comment Result Confirmed By Smear      Platelet Comment OCCASIONAL      Differential Type AUTOMATED     Basic Metabolic Panel w/ Reflex to MG    Collection Time: 07/06/23  6:38 AM   Result Value Ref Range    Sodium 141 136 - 145 mmol/L    Potassium 4.7 3.5 - 5.1 mmol/L    Chloride 105 98 - 107 mmol/L    CO2 22 20 - 29 mmol/L    Anion  Gap 14 7 - 16 mmol/L    Glucose 115 (H) 70 - 99 mg/dL    BUN 45 (H) 8 - 23 MG/DL    Creatinine 6.96 (H) 0.80 - 1.30 MG/DL    Est, Glom Filt Rate 38 (L) >60 ml/min/1.15m2    Calcium 8.9 8.8 - 10.2 MG/DL   CBC with Auto Differential    Collection Time: 07/07/23  7:31 AM   Result Value Ref Range    WBC 13.2 (H) 4.3 - 11.1 K/uL    RBC 3.43 (L) 4.23 - 5.6 M/uL    Hemoglobin 10.1 (L) 13.6 - 17.2 g/dL    Hematocrit 29.5 (L) 41.1 - 50.3 %    MCV 90.1 82.0 - 102.0 FL    MCH 29.4 26.1 - 32.9 PG    MCHC 32.7 31.4 - 35.0 g/dL    RDW 28.4 (H) 13.2 - 14.6 %    Platelets 98 (L) 150 - 450 K/uL    MPV Unable to calculate. Recommend adding IPF. 9.4 - 12.3 FL    nRBC 0.00 0.0 - 0.2 K/uL    Neutrophils % 69.4 43.0 - 78.0 %    Lymphocytes % 4.6 (L) 13.0 - 44.0 %    Monocytes % 18.1 (H) 4.0 - 12.0 %    Eosinophils % 0.1 (L) 0.5 - 7.8 %    Basophils % 0.7 0.0 - 2.0 %    Immature Granulocytes % 7.1 (H) 0.0 - 5.0 %    Neutrophils Absolute 9.16 (H) 1.70 - 8.20 K/UL    Lymphocytes Absolute 0.61 0.50 - 4.60 K/UL    Monocytes Absolute 2.39 (H) 0.10 - 1.30 K/UL    Eosinophils Absolute 0.01 0.00 - 0.80 K/UL    Basophils Absolute 0.09 0.00 - 0.20 K/UL    Immature Granulocytes Absolute 0.94 (H) 0.0 - 0.5 K/UL    RBC Comment SLIGHT  ANISOCYTOSIS        RBC Comment SLIGHT  POLYCHROMASIA        RBC Comment SLIGHT  MACROCYTOSIS        WBC Comment Result Confirmed By Smear      Platelet Comment SLIGHT      Differential Type  AUTOMATED     Basic Metabolic Panel w/ Reflex to MG    Collection Time: 07/07/23  7:31 AM   Result Value Ref Range    Sodium 139 136 - 145 mmol/L    Potassium 4.8 3.5 - 5.1 mmol/L    Chloride 104 98 - 107 mmol/L    CO2 18 (L) 20 - 29 mmol/L    Anion Gap 17 (H) 7 - 16 mmol/L    Glucose 106 (H) 70 - 99 mg/dL    BUN 44 (H) 8 - 23 MG/DL    Creatinine 4.40 (H) 0.80 - 1.30 MG/DL    Est, Glom Filt Rate 39 (L) >60 ml/min/1.13m2    Calcium 8.7 (L) 8.8 - 10.2 MG/DL       No results for input(s): "COVID19" in the last 72 hours.    Current Meds:  Current Facility-Administered Medications   Medication Dose Route Frequency    apixaban (ELIQUIS) tablet 2.5 mg  2.5 mg Oral 2 times per day    rosuvastatin (CRESTOR) tablet 10 mg  10 mg Oral Nightly    sodium chloride (Inhalant) 3 % nebulizer solution 4 mL  4 mL Nebulization PRN    arformoterol tartrate (BROVANA) nebulizer solution 15 mcg  15 mcg Nebulization BID RT    sodium  chloride (Inhalant) 3 % nebulizer solution 4 mL  4 mL Nebulization BID RT    prednisoLONE acetate (PRED FORTE) 1 % ophthalmic suspension 1 drop  1 drop Left Eye 6 times per day    atropine 1 % ophthalmic solution 1 drop  1 drop Left Eye Nightly    brimonidine (ALPHAGAN) 0.2 % ophthalmic solution 1 drop  1 drop Left Eye TID    sodium chloride flush 0.9 % injection 5-40 mL  5-40 mL IntraVENous 2 times per day    sodium chloride flush 0.9 % injection 5-40 mL  5-40 mL IntraVENous PRN    0.9 % sodium chloride infusion   IntraVENous PRN    potassium chloride (KLOR-CON M) extended release tablet 40 mEq  40 mEq Oral PRN    Or    potassium bicarb-citric acid (EFFER-K) effervescent tablet 40 mEq  40 mEq Oral PRN    Or    potassium chloride 10 mEq/100 mL IVPB (Peripheral Line)  10 mEq IntraVENous PRN    magnesium sulfate 2000 mg in 50 mL IVPB premix  2,000 mg IntraVENous PRN    ondansetron (ZOFRAN-ODT) disintegrating tablet 4 mg  4 mg Oral Q8H PRN    Or    ondansetron (ZOFRAN) injection 4 mg  4 mg IntraVENous Q6H PRN     polyethylene glycol (GLYCOLAX) packet 17 g  17 g Oral Daily PRN    acetaminophen (TYLENOL) tablet 650 mg  650 mg Oral Q6H PRN    Or    acetaminophen (TYLENOL) suppository 650 mg  650 mg Rectal Q6H PRN    piperacillin-tazobactam (ZOSYN) 3,375 mg in sodium chloride 0.9 % 50 mL IVPB (mini-bag)  3,375 mg IntraVENous Q8H    guaiFENesin (MUCINEX) extended release tablet 600 mg  600 mg Oral BID    ipratropium 0.5 mg-albuterol 2.5 mg (DUONEB) nebulizer solution 1 Dose  1 Dose Inhalation Q4H WA RT    carvedilol (COREG) tablet 6.25 mg  6.25 mg Oral BID WC    sodium bicarbonate tablet 650 mg  650 mg Oral 4x Daily    predniSONE (DELTASONE) tablet 40 mg  40 mg Oral Daily    doxazosin (CARDURA) tablet 4 mg  4 mg Oral Daily    pantoprazole (PROTONIX) tablet 40 mg  40 mg Oral QAM AC    furosemide (LASIX) tablet 20 mg  20 mg Oral Daily       Signed:  [facility] [place], [place]    Part of this note may have been written by using a voice dictation software.  The note has been proof read but may still contain some grammatical/other typographical errors.

## 2023-07-08 LAB — CBC WITH AUTO DIFFERENTIAL
Basophils %: 0.3 % (ref 0.0–2.0)
Basophils Absolute: 0.04 10*3/uL (ref 0.00–0.20)
Eosinophils %: 0 % — ABNORMAL LOW (ref 0.5–7.8)
Eosinophils Absolute: 0 10*3/uL (ref 0.00–0.80)
Hematocrit: 26.8 % — ABNORMAL LOW (ref 41.1–50.3)
Hemoglobin: 9.2 g/dL — ABNORMAL LOW (ref 13.6–17.2)
Immature Granulocytes %: 5.7 % — ABNORMAL HIGH (ref 0.0–5.0)
Immature Granulocytes Absolute: 0.8 10*3/uL — ABNORMAL HIGH (ref 0.0–0.5)
Lymphocytes %: 3.9 % — ABNORMAL LOW (ref 13.0–44.0)
Lymphocytes Absolute: 0.55 10*3/uL (ref 0.50–4.60)
MCH: 29.3 pg (ref 26.1–32.9)
MCHC: 34.3 g/dL (ref 31.4–35.0)
MCV: 85.4 fL (ref 82.0–102.0)
MPV: 13 fL — ABNORMAL HIGH (ref 9.4–12.3)
Monocytes %: 10.4 % (ref 4.0–12.0)
Monocytes Absolute: 1.47 10*3/uL — ABNORMAL HIGH (ref 0.10–1.30)
Neutrophils %: 79.7 % — ABNORMAL HIGH (ref 43.0–78.0)
Neutrophils Absolute: 11.24 10*3/uL — ABNORMAL HIGH (ref 1.70–8.20)
Platelets: 153 10*3/uL (ref 150–450)
RBC: 3.14 M/uL — ABNORMAL LOW (ref 4.23–5.6)
RDW: 15.2 % — ABNORMAL HIGH (ref 11.9–14.6)
WBC: 14.1 10*3/uL — ABNORMAL HIGH (ref 4.3–11.1)
nRBC: 0 10*3/uL (ref 0.0–0.2)

## 2023-07-08 LAB — BASIC METABOLIC PANEL W/ REFLEX TO MG FOR LOW K
Anion Gap: 11 mmol/L (ref 7–16)
BUN: 52 mg/dL — ABNORMAL HIGH (ref 8–23)
CO2: 21 mmol/L (ref 20–29)
Calcium: 8.7 mg/dL — ABNORMAL LOW (ref 8.8–10.2)
Chloride: 107 mmol/L (ref 98–107)
Creatinine: 1.72 mg/dL — ABNORMAL HIGH (ref 0.80–1.30)
Est, Glom Filt Rate: 37 mL/min/{1.73_m2} — ABNORMAL LOW (ref >60)
Glucose: 158 mg/dL — ABNORMAL HIGH (ref 70–99)
Potassium: 4.4 mmol/L (ref 3.5–5.1)
Sodium: 139 mmol/L (ref 136–145)

## 2023-07-08 LAB — BRAIN NATRIURETIC PEPTIDE: NT Pro-BNP: 6020 pg/mL — ABNORMAL HIGH (ref 0–450)

## 2023-07-08 MED ORDER — IPRATROPIUM-ALBUTEROL 0.5-2.5 (3) MG/3ML IN SOLN
0.5-2.533 | Freq: Three times a day (TID) | RESPIRATORY_TRACT | Status: DC
Start: 2023-07-08 — End: 2023-07-09
  Administered 2023-07-09: 02:00:00 1 via RESPIRATORY_TRACT

## 2023-07-08 MED ORDER — NYSTATIN 100000 UNIT/ML MT SUSP
100000 UNIT/ML | Freq: Four times a day (QID) | OROMUCOSAL | Status: DC
Start: 2023-07-08 — End: 2023-07-17
  Administered 2023-07-08 – 2023-07-17 (×36): 500000 mL via ORAL

## 2023-07-08 MED ORDER — FUROSEMIDE 10 MG/ML IJ SOLN
10 | Freq: Once | INTRAMUSCULAR | Status: AC
Start: 2023-07-08 — End: 2023-07-08
  Administered 2023-07-08: 22:00:00 40 mg via INTRAVENOUS

## 2023-07-08 MED FILL — PANTOPRAZOLE SODIUM 40 MG PO TBEC: 40 MG | ORAL | Qty: 1

## 2023-07-08 MED FILL — PREDNISONE 20 MG PO TABS: 20 MG | ORAL | Qty: 2

## 2023-07-08 MED FILL — GUAIFENESIN ER 600 MG PO TB12: 600 MG | ORAL | Qty: 1

## 2023-07-08 MED FILL — DOXAZOSIN MESYLATE 4 MG PO TABS: 4 MG | ORAL | Qty: 1

## 2023-07-08 MED FILL — ARFORMOTEROL TARTRATE 15 MCG/2ML IN NEBU: 152 MCG/2ML | RESPIRATORY_TRACT | Qty: 2

## 2023-07-08 MED FILL — IPRATROPIUM-ALBUTEROL 0.5-2.5 (3) MG/3ML IN SOLN: 0.5-2.533 (3) MG/3ML | RESPIRATORY_TRACT | Qty: 3

## 2023-07-08 MED FILL — CARVEDILOL 6.25 MG PO TABS: 6.25 MG | ORAL | Qty: 1

## 2023-07-08 MED FILL — ELIQUIS 2.5 MG PO TABS: 2.5 MG | ORAL | Qty: 1

## 2023-07-08 MED FILL — FUROSEMIDE 20 MG PO TABS: 20 MG | ORAL | Qty: 1

## 2023-07-08 MED FILL — NYSTATIN 100000 UNIT/ML MT SUSP: 100000 UNIT/ML | OROMUCOSAL | Qty: 5

## 2023-07-08 MED FILL — PIPERACILLIN SOD-TAZOBACTAM SO 3.375 (3-0.375) G IV SOLR: 3.3753-0.375 (3-0.375) g | INTRAVENOUS | Qty: 3375

## 2023-07-08 MED FILL — ROSUVASTATIN CALCIUM 5 MG PO TABS: 5 MG | ORAL | Qty: 2

## 2023-07-08 MED FILL — SODIUM BICARBONATE 650 MG PO TABS: 650 MG | ORAL | Qty: 1

## 2023-07-08 MED FILL — SODIUM CHLORIDE 3 % IN NEBU: 3 % | RESPIRATORY_TRACT | Qty: 4

## 2023-07-08 MED FILL — FUROSEMIDE 10 MG/ML IJ SOLN: 10 MG/ML | INTRAMUSCULAR | Qty: 4

## 2023-07-08 NOTE — Progress Notes (Signed)
Physician Progress Note      PATIENT:               Paul Medina, Paul Medina  CSN #:                  [id number]  DOB:                       [birth year]  ADMIT DATE:       07/04/2023 11:46 AM  DISCH DATE:  [name]  PROVIDER #:        Dana Allan DO          QUERY TEXT:    Patient admitted with COVID pneumonia and severe sepsis.  Pt noted to have BPH   and was treated with Cardura. ?If possible, please document in progress notes   and discharge summary if you are evaluating and/or treating any of the   following:  The medical record reflects the following:    Risk Factors: age 39 yrs, CAD, HFrEF, TIA, HTN, HLD    Clinical Indicators:  History of BPH  -creatinine: 1.38 - 1.68 - 1.64  -Flow sheets note that patient is voiding, but is incontinent in brief    Treatment: Cardura daily  Options provided:  -- BPH with partial/complete urinary obstruction  -- BPH with urinary retention without obstruction due to use of Cardura  -- Other - I will add my own diagnosis  -- Disagree - Not applicable / Not valid  -- Disagree - Clinically unable to determine / Unknown  -- Refer to Clinical Documentation Reviewer    PROVIDER RESPONSE TEXT:    This patient has BPH with urinary retention without obstruction due to use of   Cardura    Query created by: Derrell Lolling on 07/07/2023 9:55 AM      QUERY TEXT:    Patient admitted with COVID pneumonia and severe sepsis, noted to have atrial   fibrillation and is maintained on Eliquis. If possible, please document in   progress notes and discharge summary if you are evaluating and/or treating any   of the following:?  ?  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    Query created by: Derrell Lolling on 07/07/2023 9:55 AM      Electronically signed by:  Nena Jordan Patsey Pitstick DO 07/08/2023 6:10 PM

## 2023-07-08 NOTE — Progress Notes (Signed)
PT attempted to see pt this pm but pt strongly refused any activity despite strong encouragement from PT & RN. PT will follow up as allowed.  Hendricks Limes, PT, CES

## 2023-07-08 NOTE — Plan of Care (Signed)
Problem: Discharge Planning  Goal: Discharge to home or other facility with appropriate resources  07/08/2023 0421 by Abe People, RN  Outcome: Progressing     Problem: Safety - Adult  Goal: Free from fall injury  07/08/2023 0421 by Abe People, RN  Outcome: Progressing     Problem: ABCDS Injury Assessment  Goal: Absence of physical injury  07/08/2023 0421 by Abe People, RN  Outcome: Progressing     Problem: Respiratory - Adult  Goal: Achieves optimal ventilation and oxygenation  07/08/2023 0421 by Abe People, RN  Outcome: Progressing

## 2023-07-08 NOTE — Progress Notes (Addendum)
 Hospitalist Progress Note   Admit Date:  07/04/2023 11:46 AM   Name:  Paul Medina   Age:  89 y.o.  Sex:  male  DOB:  04-Feb-1931   MRN:  [id number]   Room:  357/01    Presenting/Chief Complaint: Shortness of Breath     Reason(s) for Admission: Severe sepsis (HCC) [A41.9, R65.20]  Pneumonia of right lung due to infectious organism, unspecified part of lung [J18.9]  Sepsis, due to unspecified organism, unspecified whether acute organ dysfunction present [name] [A41.9]     Hospital Course:   Paul Medina is a 89 y.o. male with medical history of A-fib, CHF with reduced ejection fraction, hypertension, hyperlipidemia, CAD, TIA, left eye blindness, OSA, CKD stage III, asthma who presented with worsening cough and shortness of breath.  Of note patient was recently hospitalized at [facility] [place] and discharged yesterday.  Patient was hospitalized during that visit for A-fib with RVR and community-acquired pneumonia, asthma exacerbation, and COVID-19 pneumonia.  Patient was discharged home to his studio apartment where he lives alone but has children in the area.  He states that his shortness of breath got worse and he was not eating or drinking.  During last hospitalization patient was evaluated by speech therapy as well as GI for dysphagia.  No EGD was recommended by GI at that time.  Speech therapy recommended minced and moist diet with thin liquids.  Patient states that he is still been having dysphagia and occasionally chokes on food/liquids.     On arrival to the ED notable labs include lactic acid 3.7 down trended to 0.5, Pro-Cal 0.08, WBCs 12 (3 at discharge on 2/10), creatinine 1.46 (near baseline).  CT chest PE protocol shows no PE but does show worsening right-sided pneumonia with mild improvement of left lung from previous exam, worsening mucous plugging in lower lobes raising concern for aspiration pneumonia/pneumonitis.  Patient started on Zyvox and Zosyn in ED, stop Zyvox on 2/12 as MRSA  swab negative.  Blood cultures no growth to date.  Speech therapy recommended regular consistency and thin liquids.      Subjective & 24hr Events:   Evaluated at bedside.  Resting in bed on 3 L oxygen via nasal cannula.  States he is able to move to chair yesterday and have some productive cough.  Complaining of burning in his throat today.  Scant white plaques noted in mouth; nystatin swish and swallow ordered.  IV Lasix given today.      Assessment & Plan:     Severe sepsis:  -As evidenced by tachypnea, leukocytosis, lactic acid 3.7 in setting of hospital-acquired pneumonia  -Status post Zyvox on 2/12 as MRSA nares negative  -Continue Zosyn   -Blood cultures no growth to date  -UA unremarkable     Hospital-acquired pneumonia:  -Recent hospital admission for A-fib with RVR, community-acquired pneumonia, asthma exacerbation, and COVID-19  -Concern for aspiration pneumonia, speech therapy evaluated and diet ordered  -Chest CT PE protocol: No PE, pneumonia (right greater than left), mucous plugging with concern for aspiration  -Status post Zyvox, stopped on 2/12 as MRSA nares negative  -Continue Zosyn  -Blood cultures no growth to date    Mucous plugging:  -As seen on CT imaging  -Mucinex twice daily  -Incentive spirometer  -Hypertonic saline nebulizer started 2/13, continue  -Continue AccuPAP  -DuoNebs every 4 hours while awake  -Continue Brovana  -Patient will need to be up out of bed with all meals    Oral thrush:  -  Nystatin swish and swallow ordered     Atrial fibrillation, not in RVR:  -Recent hospital admission with RVR  -Continue home Eliquis, Coreg     Heart failure with reduced ejection fraction:  -Echocardiogram 06/29/2023: EF 30-35%, grade 2 diastolic dysfunction  -BNP 6000, 2500 on 06/28/2023  -Seen by cardiology at last hospital admission  -Continue home Coreg  -Holding home losartan, SGLT2, Aldactone given severe sepsis, will restart when able  -40 mg IV Lasix given 2/15, assess response and continue as  needed  -Will need repeat outpatient echocardiogram in 3 months  -BNP ordered    Left great toe pain, resolved:  -Left foot x-ray shows no fractures, hammertoe deformities  -Low suspicion for gout given exam, will monitor     History of TIA:  -Continue home aspirin 81 mg daily     History of B-cell lymphoma:  -Continue outpatient follow-up with Dr. Rulon Abide at [facility] following discharge     Obstructive sleep apnea:  -Respiratory consult for CPAP while inpatient     CAD:  -Continue home aspirin, Coreg  -Will restart home losartan, SGLT2, and Aldactone when able, held in setting of severe sepsis     Hypertension:  -Holding antihypertensives in setting of severe sepsis     Hyperlipidemia:  -Continue home Crestor     BPH:  -Continue home Cardura      Anticipated Discharge Arrangements:   STR    PT/OT evals ordered?  Therapy evals ordered  Diet:  ADULT DIET; Dysphagia - Minced and Moist; No Drinking Straws  ADULT ORAL NUTRITION SUPPLEMENT; Breakfast, Lunch, Dinner; Educational psychologist Protein Oral Supplement  VTE prophylaxis: Already on anticoagulation  Code status: DNR      Non-peripheral Lines and Tubes (if present):          Telemetry (if present):  Cardiac/Telemetry Monitor On: No        Hospital Problems:  Principal Problem:    Severe sepsis (HCC)  Active Problems:    Chronic heart failure with mildly reduced ejection fraction (HFmrEF, 41-49%) (HCC)    HTN (hypertension)    HLD (hyperlipidemia)    Coronary atherosclerosis of native coronary artery    TIA (transient ischemic attack)    OSA (obstructive sleep apnea)    Stage 3 chronic kidney disease (HCC)    Atrial fibrillation (HCC)    Hospital-acquired pneumonia  Resolved Problems:    * No resolved hospital problems. *      Objective:   Patient Vitals for the past 24 hrs:   Temp Pulse Resp BP SpO2   07/08/23 0741 98.8 F (37.1 C) 58 16 124/64 94 %   07/07/23 2234 -- 66 20 -- 96 %   07/07/23 2017 99.1 F (37.3 C) 59 19 (!) 89/59 97 %   07/07/23 1121 -- 98 20  -- 90 %       Oxygen Therapy  SpO2: 94 %  Pulse via Oximetry: 83 beats per minute  Pulse Oximeter Device Mode: Intermittent  Pulse Oximeter Device Location: Left, Finger  O2 Device: Nasal cannula  O2 Flow Rate (L/min): 3 L/min    Estimated body mass index is 29.05 kg/m as calculated from the following:    Height as of this encounter: 1.676 m (5\' 6" ).    Weight as of this encounter: 81.6 kg (180 lb).    Intake/Output Summary (Last 24 hours) at 07/08/2023 0855  Last data filed at 07/07/2023 1455  Gross per 24 hour   Intake --  Output 250 ml   Net -250 ml         Physical Exam:   General:           Elderly, lying in bed, appears weak  Head:               Normocephalic, atraumatic  Eyes:               Sclerae appear normal.   ENT:                Nares appear normal.  Moist oral mucosa  Neck:               No restricted ROM.  Trachea midline   CV:                  RRR.  No m/r/g.   Lungs:             Rales noted bilaterally (more noticeable at lung bases).  Symmetric expansion.  Weak wet cough noted, 3 L nasal cannula oxygen  Abdomen:        Soft, nontender, nondistended.  Extremities:     No cyanosis or clubbing.  No edema  Skin:                No rashes.  Normal coloration.   Warm and dry.    Neuro:             CN II-XII grossly intact.  Moves all extremities  Psych:             Normal mood and affect.       I have personally reviewed labs and tests:  Recent Labs:  Recent Results (from the past 48 hour(s))   CBC with Auto Differential    Collection Time: 07/07/23  7:31 AM   Result Value Ref Range    WBC 13.2 (H) 4.3 - 11.1 K/uL    RBC 3.43 (L) 4.23 - 5.6 M/uL    Hemoglobin 10.1 (L) 13.6 - 17.2 g/dL    Hematocrit 91.4 (L) 41.1 - 50.3 %    MCV 90.1 82.0 - 102.0 FL    MCH 29.4 26.1 - 32.9 PG    MCHC 32.7 31.4 - 35.0 g/dL    RDW 78.2 (H) 95.6 - 14.6 %    Platelets 98 (L) 150 - 450 K/uL    MPV Unable to calculate. Recommend adding IPF. 9.4 - 12.3 FL    nRBC 0.00 0.0 - 0.2 K/uL    Neutrophils % 69.4 43.0 - 78.0 %     Lymphocytes % 4.6 (L) 13.0 - 44.0 %    Monocytes % 18.1 (H) 4.0 - 12.0 %    Eosinophils % 0.1 (L) 0.5 - 7.8 %    Basophils % 0.7 0.0 - 2.0 %    Immature Granulocytes % 7.1 (H) 0.0 - 5.0 %    Neutrophils Absolute 9.16 (H) 1.70 - 8.20 K/UL    Lymphocytes Absolute 0.61 0.50 - 4.60 K/UL    Monocytes Absolute 2.39 (H) 0.10 - 1.30 K/UL    Eosinophils Absolute 0.01 0.00 - 0.80 K/UL    Basophils Absolute 0.09 0.00 - 0.20 K/UL    Immature Granulocytes Absolute 0.94 (H) 0.0 - 0.5 K/UL    RBC Comment SLIGHT  ANISOCYTOSIS        RBC Comment SLIGHT  POLYCHROMASIA        RBC Comment SLIGHT  MACROCYTOSIS        WBC  Comment Result Confirmed By Smear      Platelet Comment SLIGHT      Differential Type AUTOMATED     Basic Metabolic Panel w/ Reflex to MG    Collection Time: 07/07/23  7:31 AM   Result Value Ref Range    Sodium 139 136 - 145 mmol/L    Potassium 4.8 3.5 - 5.1 mmol/L    Chloride 104 98 - 107 mmol/L    CO2 18 (L) 20 - 29 mmol/L    Anion Gap 17 (H) 7 - 16 mmol/L    Glucose 106 (H) 70 - 99 mg/dL    BUN 44 (H) 8 - 23 MG/DL    Creatinine 3.76 (H) 0.80 - 1.30 MG/DL    Est, Glom Filt Rate 39 (L) >60 ml/min/1.54m2    Calcium 8.7 (L) 8.8 - 10.2 MG/DL   CBC with Auto Differential    Collection Time: 07/08/23  5:34 AM   Result Value Ref Range    WBC 14.1 (H) 4.3 - 11.1 K/uL    RBC 3.14 (L) 4.23 - 5.6 M/uL    Hemoglobin 9.2 (L) 13.6 - 17.2 g/dL    Hematocrit 28.3 (L) 41.1 - 50.3 %    MCV 85.4 82.0 - 102.0 FL    MCH 29.3 26.1 - 32.9 PG    MCHC 34.3 31.4 - 35.0 g/dL    RDW 15.1 (H) 76.1 - 14.6 %    Platelets 153 150 - 450 K/uL    MPV 13.0 (H) 9.4 - 12.3 FL    nRBC 0.00 0.0 - 0.2 K/uL    Neutrophils % 79.7 (H) 43.0 - 78.0 %    Lymphocytes % 3.9 (L) 13.0 - 44.0 %    Monocytes % 10.4 4.0 - 12.0 %    Eosinophils % 0.0 (L) 0.5 - 7.8 %    Basophils % 0.3 0.0 - 2.0 %    Immature Granulocytes % 5.7 (H) 0.0 - 5.0 %    Neutrophils Absolute 11.24 (H) 1.70 - 8.20 K/UL    Lymphocytes Absolute 0.55 0.50 - 4.60 K/UL    Monocytes Absolute 1.47 (H)  0.10 - 1.30 K/UL    Eosinophils Absolute 0.00 0.00 - 0.80 K/UL    Basophils Absolute 0.04 0.00 - 0.20 K/UL    Immature Granulocytes Absolute 0.80 (H) 0.0 - 0.5 K/UL    RBC Comment SLIGHT  SCHISTOCYTES        RBC Comment SLIGHT  POLYCHROMASIA        RBC Comment SLIGHT  OVALOCYTES        WBC Comment Result Confirmed By Smear      Platelet Comment LARGE FORMS PRESENT      Differential Type AUTOMATED     Basic Metabolic Panel w/ Reflex to MG    Collection Time: 07/08/23  5:34 AM   Result Value Ref Range    Sodium 139 136 - 145 mmol/L    Potassium 4.4 3.5 - 5.1 mmol/L    Chloride 107 98 - 107 mmol/L    CO2 21 20 - 29 mmol/L    Anion Gap 11 7 - 16 mmol/L    Glucose 158 (H) 70 - 99 mg/dL    BUN 52 (H) 8 - 23 MG/DL    Creatinine 6.07 (H) 0.80 - 1.30 MG/DL    Est, Glom Filt Rate 37 (L) >60 ml/min/1.46m2    Calcium 8.7 (L) 8.8 - 10.2 MG/DL       No results for input(s): "COVID19" in  the last 72 hours.    Current Meds:  Current Facility-Administered Medications   Medication Dose Route Frequency    apixaban (ELIQUIS) tablet 2.5 mg  2.5 mg Oral 2 times per day    rosuvastatin (CRESTOR) tablet 10 mg  10 mg Oral Nightly    sodium chloride (Inhalant) 3 % nebulizer solution 4 mL  4 mL Nebulization PRN    arformoterol tartrate (BROVANA) nebulizer solution 15 mcg  15 mcg Nebulization BID RT    sodium chloride (Inhalant) 3 % nebulizer solution 4 mL  4 mL Nebulization BID RT    prednisoLONE acetate (PRED FORTE) 1 % ophthalmic suspension 1 drop  1 drop Left Eye 6 times per day    atropine 1 % ophthalmic solution 1 drop  1 drop Left Eye Nightly    brimonidine (ALPHAGAN) 0.2 % ophthalmic solution 1 drop  1 drop Left Eye TID    sodium chloride flush 0.9 % injection 5-40 mL  5-40 mL IntraVENous 2 times per day    sodium chloride flush 0.9 % injection 5-40 mL  5-40 mL IntraVENous PRN    0.9 % sodium chloride infusion   IntraVENous PRN    potassium chloride (KLOR-CON M) extended release tablet 40 mEq  40 mEq Oral PRN    Or    potassium  bicarb-citric acid (EFFER-K) effervescent tablet 40 mEq  40 mEq Oral PRN    Or    potassium chloride 10 mEq/100 mL IVPB (Peripheral Line)  10 mEq IntraVENous PRN    magnesium sulfate 2000 mg in 50 mL IVPB premix  2,000 mg IntraVENous PRN    ondansetron (ZOFRAN-ODT) disintegrating tablet 4 mg  4 mg Oral Q8H PRN    Or    ondansetron (ZOFRAN) injection 4 mg  4 mg IntraVENous Q6H PRN    polyethylene glycol (GLYCOLAX) packet 17 g  17 g Oral Daily PRN    acetaminophen (TYLENOL) tablet 650 mg  650 mg Oral Q6H PRN    Or    acetaminophen (TYLENOL) suppository 650 mg  650 mg Rectal Q6H PRN    piperacillin-tazobactam (ZOSYN) 3,375 mg in sodium chloride 0.9 % 50 mL IVPB (mini-bag)  3,375 mg IntraVENous Q8H    guaiFENesin (MUCINEX) extended release tablet 600 mg  600 mg Oral BID    ipratropium 0.5 mg-albuterol 2.5 mg (DUONEB) nebulizer solution 1 Dose  1 Dose Inhalation Q4H WA RT    carvedilol (COREG) tablet 6.25 mg  6.25 mg Oral BID WC    sodium bicarbonate tablet 650 mg  650 mg Oral 4x Daily    predniSONE (DELTASONE) tablet 40 mg  40 mg Oral Daily    doxazosin (CARDURA) tablet 4 mg  4 mg Oral Daily    pantoprazole (PROTONIX) tablet 40 mg  40 mg Oral QAM AC    furosemide (LASIX) tablet 20 mg  20 mg Oral Daily       Signed:  [facility] [place], [place]    Part of this note may have been written by using a voice dictation software.  The note has been proof read but may still contain some grammatical/other typographical errors.

## 2023-07-08 NOTE — Progress Notes (Signed)
 Accessed patient's chart to assist primary RN

## 2023-07-09 LAB — CBC WITH AUTO DIFFERENTIAL
Basophils %: 0.3 % (ref 0.0–2.0)
Basophils Absolute: 0.04 10*3/uL (ref 0.00–0.20)
Eosinophils %: 0 % — ABNORMAL LOW (ref 0.5–7.8)
Eosinophils Absolute: 0 10*3/uL (ref 0.00–0.80)
Hematocrit: 29.6 % — ABNORMAL LOW (ref 41.1–50.3)
Hemoglobin: 9.9 g/dL — ABNORMAL LOW (ref 13.6–17.2)
Immature Granulocytes %: 5.2 % — ABNORMAL HIGH (ref 0.0–5.0)
Immature Granulocytes Absolute: 0.75 10*3/uL — ABNORMAL HIGH (ref 0.0–0.5)
Lymphocytes %: 4.4 % — ABNORMAL LOW (ref 13.0–44.0)
Lymphocytes Absolute: 0.63 10*3/uL (ref 0.50–4.60)
MCH: 28.9 pg (ref 26.1–32.9)
MCHC: 33.4 g/dL (ref 31.4–35.0)
MCV: 86.3 fL (ref 82.0–102.0)
MPV: UNDETERMINED fL (ref 9.4–12.3)
Monocytes %: 13 % — ABNORMAL HIGH (ref 4.0–12.0)
Monocytes Absolute: 1.87 10*3/uL — ABNORMAL HIGH (ref 0.10–1.30)
Neutrophils %: 77.1 % (ref 43.0–78.0)
Neutrophils Absolute: 11.11 10*3/uL — ABNORMAL HIGH (ref 1.70–8.20)
Platelet Comment: ADEQUATE
Platelets: 140 10*3/uL — ABNORMAL LOW (ref 150–450)
RBC: 3.43 M/uL — ABNORMAL LOW (ref 4.23–5.6)
RDW: 15 % — ABNORMAL HIGH (ref 11.9–14.6)
WBC: 14.4 10*3/uL — ABNORMAL HIGH (ref 4.3–11.1)
nRBC: 0 10*3/uL (ref 0.0–0.2)

## 2023-07-09 LAB — BASIC METABOLIC PANEL W/ REFLEX TO MG FOR LOW K
Anion Gap: 15 mmol/L (ref 7–16)
BUN: 57 mg/dL — ABNORMAL HIGH (ref 8–23)
CO2: 22 mmol/L (ref 20–29)
Calcium: 9 mg/dL (ref 8.8–10.2)
Chloride: 103 mmol/L (ref 98–107)
Creatinine: 1.87 mg/dL — ABNORMAL HIGH (ref 0.80–1.30)
Est, Glom Filt Rate: 34 mL/min/{1.73_m2} — ABNORMAL LOW (ref >60)
Glucose: 125 mg/dL — ABNORMAL HIGH (ref 70–99)
Potassium: 4.1 mmol/L (ref 3.5–5.1)
Sodium: 140 mmol/L (ref 136–145)

## 2023-07-09 LAB — CULTURE, BLOOD 2: Culture: NO GROWTH

## 2023-07-09 LAB — MAGNESIUM: Magnesium: 2.1 mg/dL (ref 1.8–2.4)

## 2023-07-09 LAB — CULTURE, BLOOD 1: Culture: NO GROWTH

## 2023-07-09 MED ORDER — AZITHROMYCIN 250 MG PO TABS
250 | Freq: Every day | ORAL | Status: AC
Start: 2023-07-09 — End: 2023-07-12
  Administered 2023-07-09 – 2023-07-12 (×4): 250 mg via ORAL

## 2023-07-09 MED ORDER — IPRATROPIUM-ALBUTEROL 0.5-2.5 (3) MG/3ML IN SOLN
0.5-2.533 (3) MG/3ML | Freq: Two times a day (BID) | RESPIRATORY_TRACT | Status: DC
Start: 2023-07-09 — End: 2023-07-21
  Administered 2023-07-10 – 2023-07-21 (×20): 1 via RESPIRATORY_TRACT

## 2023-07-09 MED ORDER — IPRATROPIUM-ALBUTEROL 0.5-2.5 (3) MG/3ML IN SOLN
0.5-2.533 | RESPIRATORY_TRACT | Status: AC
Start: 2023-07-09 — End: 2023-07-09
  Administered 2023-07-09: 23:00:00 1 via RESPIRATORY_TRACT

## 2023-07-09 MED ORDER — AMOXICILLIN-POT CLAVULANATE 500-125 MG PO TABS
500-125 | Freq: Two times a day (BID) | ORAL | Status: AC
Start: 2023-07-09 — End: 2023-07-13
  Administered 2023-07-09 – 2023-07-14 (×10): 1 via ORAL

## 2023-07-09 MED FILL — NYSTATIN 100000 UNIT/ML MT SUSP: 100000 UNIT/ML | OROMUCOSAL | Qty: 5

## 2023-07-09 MED FILL — IPRATROPIUM-ALBUTEROL 0.5-2.5 (3) MG/3ML IN SOLN: 0.5-2.533 (3) MG/3ML | RESPIRATORY_TRACT | Qty: 3

## 2023-07-09 MED FILL — SODIUM BICARBONATE 650 MG PO TABS: 650 MG | ORAL | Qty: 1

## 2023-07-09 MED FILL — PIPERACILLIN SOD-TAZOBACTAM SO 3.375 (3-0.375) G IV SOLR: 3.3753-0.375 (3-0.375) g | INTRAVENOUS | Qty: 3375

## 2023-07-09 MED FILL — ARFORMOTEROL TARTRATE 15 MCG/2ML IN NEBU: 152 MCG/2ML | RESPIRATORY_TRACT | Qty: 2

## 2023-07-09 MED FILL — ELIQUIS 2.5 MG PO TABS: 2.5 MG | ORAL | Qty: 1

## 2023-07-09 MED FILL — CARVEDILOL 6.25 MG PO TABS: 6.25 MG | ORAL | Qty: 1

## 2023-07-09 MED FILL — SODIUM CHLORIDE 3 % IN NEBU: 3 % | RESPIRATORY_TRACT | Qty: 4

## 2023-07-09 MED FILL — AMOXICILLIN-POT CLAVULANATE 500-125 MG PO TABS: 500-125 MG | ORAL | Qty: 1

## 2023-07-09 MED FILL — GUAIFENESIN ER 600 MG PO TB12: 600 MG | ORAL | Qty: 1

## 2023-07-09 MED FILL — ROSUVASTATIN CALCIUM 5 MG PO TABS: 5 MG | ORAL | Qty: 2

## 2023-07-09 MED FILL — AZITHROMYCIN 250 MG PO TABS: 250 MG | ORAL | Qty: 1

## 2023-07-09 MED FILL — DOXAZOSIN MESYLATE 4 MG PO TABS: 4 MG | ORAL | Qty: 1

## 2023-07-09 MED FILL — PANTOPRAZOLE SODIUM 40 MG PO TBEC: 40 MG | ORAL | Qty: 1

## 2023-07-09 NOTE — Plan of Care (Signed)
 Problem: Discharge Planning  Goal: Discharge to home or other facility with appropriate resources  Outcome: Progressing     Problem: Safety - Adult  Goal: Free from fall injury  Outcome: Progressing     Problem: ABCDS Injury Assessment  Goal: Absence of physical injury  Outcome: Progressing     Problem: Respiratory - Adult  Goal: Achieves optimal ventilation and oxygenation  Outcome: Progressing

## 2023-07-09 NOTE — Progress Notes (Signed)
Occupational and Physical Therapy Note:    Attempted to see patient this AM for occupational and physical therapy treatment  session. Patient adamantly refused any out of bed, edge of bed or in bed activity. Pt stated "my breathing is bad". Will follow and re-attempt as schedule permits/patient available. Thank you,    Jonne Ply, OT    Rehab Caseload Tracker

## 2023-07-09 NOTE — Other (Addendum)
 Respiratory Care Services     Policy Number: [id number]    Title: Oxygen Protocol    Effective Date: 05/1994    Revised Date: 10/2011, 07/21/2014, 08/2016, 11/2017    Reviewed Date: 09/2012, 07/2013, 10/2015, 03/2019        I.  Policy: The Oxygen Protocol will be initiated for all patients upon written order from physician for administration of oxygen therapy or if a patient is found to have an oxygen saturation of 88% or less. Special consideration: the goal of oxygen therapy for COPD patients is to maintain oxygen saturation between 88% - 92% to comply with GOLD Guidelines.    II. Purpose: To provide protocol driven respiratory therapy for the administration of oxygen at concentrations greater than that in ambient air with the intent of treating or preventing the symptoms and manifestations of hypoxia.    III. Responsibility: Director Respiratory Care Services, all Respiratory Care Practitioners     IV. Indications:   Implement this protocol for patients when physician orders oxygen to be administered or when patient is found to have an oxygen saturation of 88% or less.  To assure routine monitoring of patient's oxygen saturation b.i.d. and to make appropriate adjustments in accordance with ordered oxygen saturation parameters.  To assure continuity of respiratory care that meets AARC Clinical Practice Guidelines and GOLD Guidelines.  Hb < 8  Sickle Cell anemia crisis    V. Assessment:  Assess the following parameters to determine the need to adjust oxygen:  Measurement of patient's oxygen saturation via pulse oximetry.    Observation of patient's color, respiratory effort, and responsiveness.  Measurement of heart rate and respiratory rate.  Complete a three-step ambulatory oxygen saturation when ordered.    VI. Initiation:  Upon receipt of an order for oxygen, the RCP will:   Verify order in the patient's EMR, which should include the desired oxygen saturation to be maintained.  The patient shall be placed on  oxygen with humidity (except for those oxygen delivery devices that do not require humidity, i.e. venturi masks and non-rebreather masks) as ordered by the physician to achieve the prescribed oxygen saturation.  In the event that no saturation is specified, a saturation of 90% will be maintained.   Patients, who are found to have a SaO2 of 88% or less, may be started on supplemental oxygen as described above.  Patients admitted with cardiac problems/disease shall be maintained at 92% per Chest Committee recommendation.  The patient will be informed of the "no smoking policy" and instructed in the proper use of oxygen therapy.  Once desired oxygen saturation has been achieved, the RCP will document FIO2 and oxygen saturation in the respiratory section of the patient's EMR.     VII. Maintenance:   30-second oxygen saturation check will be taken to maintain the saturation ordered by the physician each day.  Patients will be assessed each shift and as needed by pulse oximetry to determine if oxygen needs to be decreased, increased or discontinued.  If changes in FIO2 are indicated, all changes will be documented in the respiratory section of the patient's EMR.   If no changes in FIO2 are required, the patient's oxygen flow rate and saturation will be recorded in the respiratory section of the patient's EMR.  Per [facility] Pulmonary, patients who are receiving oxygen therapy but are not on oxygen at home, should be weaned off oxygen as soon as possible or when anticipated discharge becomes evident.  Oxygen will be discontinued after oxygen saturation  has been maintained for 24 hours on room air and documented in the patient's EMR.  Patients on the Inpatient Rehabilitation area on 9th floor will be exempt from having their oxygen discontinued per protocol. Oxygen may be weaned but will be changed to prn to meet the needs of the patient when exercising and participating in therapy.  The goal of oxygen therapy is to maintain  patients with a diagnosis of COPD at oxygen saturation between 88% - 92% to comply with GOLD Guidelines.        VIII. Safety: RCP will address the following safety issues:  Identify patient using the two patient identifiers name and birth date via ID bracelet.  Perform hand hygiene per hospital policy utilizing Standard Precautions for all patients and following transmission-based isolation as indicated per hospital policy.  Cardiac patients will be maintained at an oxygen saturation of 92%.  If a patient's FIO2 requirements necessitate changing oxygen delivery devices to a high concentration of oxygen, documentation indicating the change must be included in the progress notes, as well as in the respiratory flowsheet.   If a patient has a hemoglobin level <8 mg. RCP will consult physician before discontinuing oxygen.    IX. Interventions:   RCP will assess patient for signs of respiratory distress or suspicion of CO2 retention.   An ABG may be obtained for patients exhibiting respiratory distress or sickle cell crisis.   An order should be entered into patient's EMR for ABG under "per protocol".    X. Documentation  Document assessment findings in the respiratory section of the patient's EMR.  Document changes in therapy per protocol in the respiratory orders section and in the care plan section of the patient's EMR.  Document patient education in the patient education section of the patient's EMR.    XI. Reportable Conditions:  Report to the physician immediately:  Acute changes in patient's respiratory status.  An oxygen saturation <85%.  A change in oxygen delivery device to provide a high concentration of oxygen.    XII. Patient Instructions: Review with Patient  Purpose of oxygen therapy  Proper technique for using oxygen  No smoking policy    Approval: Pulmonary Committee (06-16-94)  Revision: Chest Committee (09-18-03)    L - Respiratory Care Department Policy, Procedure and Protocol Guideline Manual, 1995,          J. Tietsort.  L - Therapist Driven Respiratory Care Protocols - A Practitioner's Guide for Criteria-Based       Respiratory Care by Donato Heinz, M.D., and J. Tietsort, RN, RRT.  L - The rationale for therapist-driven protocols: an update. Respiratory Care 1998.  N - AARC Clinical Practice Guidelines.            Respiratory Care Services       Policy Number: [id number]    Title: Patient Care Assessment Protocol    Effective Date: 05/1997    Revised Date: 09/2012, 08/2016, 04/2017, 11/2017    Reviewed Date: 10/2011/ 07/2013, 07/2014, 10/2015, 03/2019        Overview  In an effort to improve quality and reduce costs of respiratory care at [facility], the Respiratory Department has developed a number of Patient Care Protocols. These protocols have been developed according to [facility] Clinical Practice Guidelines and are utilized for those patients who are ordered respiratory therapy using therapeutic indications and standardized approaches for accomplishing objectives.    Patient Care Protocols are intended to improve care by:  Defining the  indications and standards of care agreed upon by the Pulmonary Medicine and Critical Care Committee of [facility].  Training respiratory care practitioners to apply those criteria to individual patients and modify therapy as indicated by the protocols.  Documenting the indication and care plan as part of the initial ordering process.  Tapering or discontinuing treatments once the indication for therapy changes.    The Patient Care Protocols shall be universally applied throughout the hospital as determined by   the Pulmonary Medicine and Critical Care Committee.    Rationale for Patient Care Assessment Protocols:  Continuous Quality Improvement  Cost containment  Standardization of care  Enhanced continuity of care  Utilization review  Timely intervention    The following patient care assessment protocols have been  developed:  Aerosolized Medication Protocol   Bronchial Hygiene Protocol   Oxygen Protocol  CVRU Fast Track Weaning Protocol   Asthma Treatment Protocol ER  Pediatric Asthma Treatment Protocol ER  Alpha-1 Antitrypsin Deficiency Protocol  Prone Positioning Protocol   COPD Protocol   Home Oxygen Assessment Protocol  Ventilator Weaning Protocol   Lung Volume Expansion Protocol    The Director of Respiratory Care Services oversees the Patient Care Assessment Program. The Respiratory Educator is responsible for protocol development and training. The Supervisor is responsible for implementation and quality assurance activities.     Each patient with an order for respiratory treatments will receive an evaluation. Respiratory Care Practitioners (RCP's) will perform the evaluations. The same evaluation tool will be utilized for initial and follow-up assessments.    If the patient does not meet criteria for ordered therapy, the therapy will be discontinued.    If the patient demonstrates an adverse response to initially ordered therapy, the therapy will be discontinued and the physician will be contacted.    Specific physician's orders that deviate from protocols and are deemed "inappropriate" or "unsafe" will be addressed with ordering physician and/or medical director as required.         Respiratory Patient Care Assessment Protocols    I.  Policy:  In an effort to provide quality patient care and effective utilization of services, physicians who order respiratory therapy will have their patients treated via the protocols established (see attached) Respiratory Care Practitioners (RCP's) will complete the initial assessment which will indicate patient needs,  the care plan developed and will performed within 24 hours of admission. Frequency of the therapy will be set according to the results of the respiratory therapy evaluation and frequency guidelines policy. Reassessment will be continued every 48 hours and more  frequently as needed for the individual patient.     II. Purpose:     To provide a process that will allow for ongoing assessment and care plan modification for patients receiving respiratory services based on both objective and or subjective patient responses to interventions. This process of protocol utilization will assist in patient care progression while eliminating the need for the physician to continually update respiratory therapy orders.   To assure continuity of respiratory care that meets AARC Clinical Practice Guidelines.    III. Initiation:  Implement Respiratory Care Protocols for patients who are ordered by physician          to receive respiratory therapy procedures or for ventilator management.     IV. Protocol:  Upon receiving an order for therapy the RCP will review the patient's EMR (electronic medical record) for all pertinent information including:  Physician's order for therapy  Patient history  and physical examination  Physician progress notes  Diagnostic. X-rays, PFT's, arterial blood gases etc.  The RCP will perform a respiratory assessment in the following manner:  General observations: color, pattern and effort of breathing, chest expansion, (symmetrical and bilateral), level of consciousness and the ability to ambulate.  The RCP will assess patient's cough ability and determine if bronchial hygiene is needed. If patient is unable to produce sputum, at that time, the RCP should question the patient with regard to their sputum: production, color consistency, frequency and amount.  Auscultation: Using a stethoscope, the RCP will listen and note quality of breath sounds and presence or absence of adventitious breath sounds in all lung fields, both anteriorly and posteriorly.  Upon completing the EMR review and physical assessment, the RCP will document findings in the "RT Assessment section" of the EMR. The score level will be provided and will be used to determine the frequency of  therapy.    V. Indications:   A.  Bronchial Hygiene Protocol indications:   Potential for or presence of atelectasis.   Need for hydration and removal of retained secretions.  Need for improvement of cough effectiveness.  Presence of conditions associated with disorder of pulmonary clearance:  Cystic fibrosis  Bronchiectasis  Neuromuscular disease  Obstructive lung diseases  Restrictive lung diseases   Aerosolized Medication(s) Protocol indications:Treatment of bronchospasm/wheezing  Improvement of mucociliary clearance  Treatment of stridor  History of Bronchiectasis, Asthma or COPD  Oxygen Therapy Protocol indications:   Documented hypoxemia  Severe trauma  Acute myocardial infarction  Short-term therapy (e.g. post anesthesia recovery)  CVRU Ventilator Weaning Protocol indications:  1. All mechanically ventilated surgical patients unless they have a no wean order.    E. Asthma Treatment Protocol ER indications:  1. Patients 47 years of age and older that have been triaged or diagnosed with   asthma exacerbation shall be indicated for the ER Asthma Treatment Protocol. A physician order will be required to initiate the protocol.   F. Pediatric Asthma protocol in the ER indications:  1. Patients less than 8 years old that have been triaged or diagnosed with asthma exacerbation shall be indicated for the Pediatric Asthma protocol.  A physician order will be required to initiate the protocol.   G. Alpha-1 Antitrypsin Deficiency Testing protocol indications:         1. Patients admitted and diagnosed with COPD.   H. Prone Positioning Protocol indications:         1. Acute lung injury         2. Acute respiratory distress syndrome (ARDS)   I.  Respiratory Care COPD Protocol indications:         1. History of COPD in patient's records         2. Smoking history   J.  Home Oxygen Assessment Protocol indications:         1. Chronic lung disease         2. Cor pulmonale         3. Unable to wean to room air 48 hours prior  to anticipated discharge.   K.  Ventilator Weaning Protocol indications:         1. Patient's mechanically ventilated          2. Managed by intensivist  L. Lung Volume Expansion Protocol indications:        1. Any patient at risk for pulmonary complications.    VI. Maintenance:    Timely patient assessment is an  integral part of this protocol therefore the following will be applied:  All non- critical care patients will be evaluated upon receiving initial respiratory care orders within 24 hours and re-evaluated within 48 hours (or more as needed).  Orders requesting a Respiratory Consult will be responded to in the following manner:  In patient emergency situations, the RCP assigned to the floor will respond immediately to the patient, provide an initial respiratory assessment, and contact the patient's physician as necessary for appropriate orders.  In non-emergent situations, the RCP assigned to the floor will respond to the patient within 90 minutes and provide an initial respiratory assessment and contact patient's physician as necessary for appropriate orders.  An RCP will provide a comprehensive assessment as soon as possible.  Upon completion of an evaluation, the RCP will complete documentation in the patient's EMR in the RT Assessment section.  The RCP who completes the assessment will document orders for therapy in the "orders section" of the patient's EMR selecting "new order". Next, "per protocol" should be selected indicating it is a protocol order and "sign orders" should be selected to complete the process. The applicable protocol must be added to the progress note per Joint Commission guidelines.  The Pharmacy and Therapeutics (P&T) Committee has mandated that the medication Xopenex may be changed to unit dose albuterol without an order, except for those patients receiving Xopenex due to cardiac arrhythmias.   The dosage for these patients should be 0.63 mg. and may be changed from 1.25 mg. to 0.63  mg per P & T Committee by the RCP completing the assessment.  Patients who are not experiencing cardiac arrhythmias, and are ordered Xopenex and Atrovent may be changed to Duoneb.    VII. Safety:        The following safety issues shall be monitored:  The RCP will perform hand hygiene per hospital policy utilizing Standard Precautions for all patients and following transmission-based isolation as indicated per hospital policy.  The RCP must exercise professional judgment in classifying the patient for frequency of therapy.  Appropriate classification of the patient will require an evaluation utilizing the Therapy Assessment Protocol Guidelines.  The RCP will confer with the physician concerning the care of the patient at any time questions or problems arise.  If during therapy, the patient exhibits no improvement, or deterioration in clinical status the RCP will notify the physician and the patient's nurse.      VIII. Interventions:   The patient's nurse is responsible concerning all items related to his/her care. Ongoing communication with nursing is essential to successful protocol management.  The RCP recognizes the value of the team approach in meeting the patient's needs. Nursing input regarding the patient's pulmonary condition will be sought as needed.     IX. Reportable conditions:   The RCP will inform the physician if:  There are acute changes in patient's respiratory status.  The therapist is unable to determine appropriate care plan upon assessment.  The patient fails to reach therapeutic objective.  A change or additional medication is needed.    X.  Patient Education:    Patient will receive instruction on the following:  The treatment modality, including objectives and proper technique of therapy  Respiratory medications  Documentation shall occur in the "patient education" section of the patient's EMR.    XI. Documentation: Record all findings as described above in the patient's EMR.    Related  Protocols: A. Aerosolized Medication Protocol  Bronchial Hygiene   Oxygen Protocol  CVRU Fast Track Weaning Protocol  Asthma Treatment protocol ER  Alpha-1 antitrypsin Deficiency Protocol  Prone Positioning Protocol  Respiratory Care COPD Protocol  Home Oxygen assessment Protocol  Ventilator Weaning Protocols   Volume Expansion protocol    Indications      Frequency          Level  A. Aerosol therapy   1.  q4h     Severe SOB, wheezing, unable to sleep 1   2.  qid, q4 wa or q6h   Moderate SOB, wheezing   2   3.  tid      Hx of asthma, or COPD mild wheezing,         or facilitate secretion removal              3   4.  bid      Asthma, or COPD, Intermittent wheezing 4   5.  PRN, i.e. tid PRN, qid PRN Asthma, or COPD, occasional wheezing 5       B. Bronchopulmonary Hygiene    1. q4h             Copious secretions, SOB, unable to sleep 1   2. qid & PRN            Moderate amounts of secretions   2   3. tid           Small amounts of secretions and poor cough,               history of secretions    3    4. PRN, i.e. tid PRN, qid PRN     Breathing exercises, encourage cough only 4      C. Oxygen Therapy     Follow hospital approved Oxygen Protocol      Note:  qid treatments are due 0800, 1200, 1600, and 2000.  tid treatments are due 0800, 1400, and 2000  Q6h treatments are due 0800, 1400, 2000, 0200  Q4 wa teatments are due 0800, 1200, 1600, and 2000.  Q4h treatments are due  0800, 1200, 1600, 2000, 0000, and 0400.        The Level 1-5 rating system is only to be used as criteria for determining appropriate frequency of therapy.     References:   N   Joint Commission [employer] Standard   L    Respiratory Care Department Policy, Procedure and Protocol Guideline Manual, 1995, J. Tietsort.   L  Therapist Driven Respiratory Care Protocols - A Practitioner's Guide for Criteria-Based Respiratory Care by Donato Heinz, M.D., and J. Tietsort, RN, RRT.   L  The rationale for therapist-driven protocols: an update. Respiratory Care  1998; [phone]   L Therapist Driven Respiratory Care Protocols - A Practitioner's Guide for Criteria-Based Respiratory Care by Donato Heinz, M.D., and J. Tietsort, RN, RRT.   L The rationale for therapist-driven protocols: an update. Respiratory Care 1998; [id number].   N   AARC Clinical Practice Guidelines.          The RCP will perform a respiratory assessment in the following manner:  Perform hand hygiene per hospital policy utilizing Standard Precautions for all patients and following transmission-based isolation as indicated per policy.  Identify patient via ID bracelet verifying patient name and birth date.  General observations: color, pattern and effort of breathing, chest expansion, (symmetrical and bilateral), level of consciousness and the ability to ambulate.  The RCP will assess patient's cough ability and determine if Nasotracheal suctioning is  needed. If patient is unable to produce sputum, at that time, the RCP should question the patient with regard to their sputum: production, color consistency, frequency and amount.  Auscultation: Using a stethoscope, the RCP will listen and note quality of breath sounds and presence or absence of adventitious breath sounds in all lung fields, both anteriorly and posteriorly.  Upon completing the EMR review and physical assessment, the RCP will document findings in the "RT Assessment section" of the EMR. The score level will be provided and will be used to determine the frequency of therapy.    V. Indications:   A.  Indications for Bronchial Hygiene Protocol will include:  Potential for or presence of atelectasis.   Need for hydration and removal of retained secretions.  Need for improvement of cough effectiveness.  Presence of conditions associated with disorder of pulmonary clearance:  Cystic fibrosis  Bronchiectasis   Indications for Aerosolized Medication(s) Protocol should include:  Treatment of bronchospasm/wheezing  Improvement of mucociliary  clearance  Treatment of stridor  History of Asthma or COPD             C.  Indications for Oxygen Therapy Protocol should include:  Documented hypoxemia  Severe trauma  Acute myocardial infarction  Short-term therapy (e.g. post anesthesia recovery)    VI. Maintenance:    Timely patient assessment is an integral part of this protocol therefore the following will be applied:  All non- critical care patients will be evaluated upon receiving initial respiratory care orders within 24 hours and re-evaluated within 48 hours (or more as needed).  Orders requesting a Respiratory Consult will be responded to in the following manner:  In patient emergency situations, the RCP assigned to the floor will respond immediately to the patient, provide an initial respiratory assessment, and contact the patient's physician as necessary for appropriate orders.  In non-emergent situations, the RCP assigned to the floor will respond to the patient within 90 minutes and provide an initial respiratory assessment and contact patient's physician as necessary for appropriate orders.  An RCP will provide a comprehensive assessment as soon as possible.  Upon completion of an evaluation, the RCP will complete documentation in the patient's EMR in the RT Assessment section.  The RCP who completes the assessment will document orders for therapy in the "orders section" of the patient's EMR selecting "new order". Next, "per protocol" should be selected indicating it is a protocol order and "sign orders" should be selected to complete the process.   The Pharmacy and Therapeutics (P&T) Committee has mandated that the medication Xopenex may be changed to unit dose albuterol without an order, except for those patients receiving Xopenex due to cardiac arrhythmias. The dosage for these patients should be 0.63 mg. and may be changed from 1.25 mg. to 0.63 mg per P & T Committee by the RCP completing the assessment.  Patients who are not experiencing cardiac  arrhythmias, and are ordered Xopenex and Atrovent may be changed to Duoneb.      VII. Safety:        The following safety issues shall be monitored:  The RCP will perform hand hygiene per hospital policy utilizing Standard Precautions for all patients and following transmission-based isolation as indicated per hospital policy.  The RCP must exercise professional judgment in classifying the patient for frequency of therapy.  Appropriate classification of the patient will require an evaluation utilizing the Therapy Assessment Protocol Guidelines.  The RCP will confer with the physician concerning the care of the  patient at any time questions or problems arise.  If during therapy, the patient exhibits no improvement or deterioration in clinical status the RCP will notify the physician and the patient's nurse.      VIII. Interventions:   The patient's nurse is responsible concerning all items related to his/her care. Ongoing communication with nursing is essential to successful protocol management.  The RCP recognizes the value of the team approach in meeting the patient's needs. Nursing input regarding the patient's pulmonary condition will be sought as needed.     IX. Reportable conditions:   The RCP will inform the physician if:  There are acute changes in patient's respiratory status.  The therapist is unable to determine appropriate care plan upon assessment.  The patient fails to reach therapeutic objective.  A change or additional medication is needed.    X.  Patient Education:    Patient will receive instruction on the following:  The treatment modality, including objectives and proper technique of therapy  Respiratory medications  Documentation shall occur in the "patient education" section of the patient's EMR.    XI. Documentation: Record all findings as described above in the patient's EMR.    Related Protocols: A. Aerosolized Medication Protocol  Bronchial Hygiene  Oxygen Protocol   Volume  Expansion/Secretion Clearance  Ventilator Weaning Protocols    References:  N   Joint Commission National Standard   L    Respiratory Care Department Policy, Procedure and Protocol Guideline Manual, 1995, J. Tietsort.   L  Therapist Driven Respiratory Care Protocols - A Practitioner's Guide for Criteria-Based Respiratory Care by Donato Heinz, M.D., and J. Tietsort, RN, RRT.   L  The rationale for therapist-driven protocols: an update. Respiratory Care 1998; [phone]   N   [facility] Clinical Practice Guidelines.                   Respiratory Care Services     Policy Number: [id number]    Title: Bronchial Hygiene Protocol    Effective Date: 05/1997    Revised Date: 04/2013, 03/2016, 11/2017    Reviewed Date: 10/2011, 09/2012, 07/2013, 07/2014, 10/2015, 08/2016, 03/2019     I.  Purpose: The Respiratory Care Practitioner (RCP) will utilize the following protocol to select and initiate bronchial hygiene therapy to open and maintain obstructed airways when indicated.    II. Patients: All patients who are ordered bronchial hygiene therapy.    III. Clinical Area: All general patient floors     IV. Protocol:  The following conditions or diseases are indications for bronchial hygiene                           therapy.  Oscillating PEP Therapy        Indications should include:   Atelectasis caused by mucus plugging or foreign body  Chronic mucociliary clearance disorders  Retained secretions which may be associated with the following conditions:  Bronchitis  Bronchiectasis  Pneumonia  PAP- Positive airway pressure therapy  Indications include:  Patients with post-operative atelectasis or to prevent post operative atelectasis.  Patients who cannot perform deep breathing exercises due to pain.  Patients requiring lung expansion therapy who cannot follow instructions.  Patients requiring lung expansion therapy with poor inspiratory capacity <10cc/kg.  Patients requiring aerosol therapy in conjunction with opening their  airways.    Vibratory / Acoustical Airway Clearance Therapy (ACT)- i.e. (Vibralung, Vest, or Percussor)  Indications should include  Patient conditions  that involve retained secretions, increased mucus production and defective mucociliary clearance such as:  Cystic fibrosis  Chronic bronchitis  Bronchiectasis  Pneumonia  Asthma  Muscular dystrophy  Post-operative atelectasis  Neuromuscular respiratory impairments  ACT may be considered in patients with COPD with  symptomatic secretion retention, guided by patient preference,  toleration, and effectiveness of therapy Dorothey Baseman, et al., 2013).  Nasotracheal suctioning indications should include:  Inability to cough effectively  Excessive secretions  Artificial airway      V. Equipment:   A. PEP therapy device   B. Vest therapy equipment   C. Vibralung   D. AccuPap   E. Percussor  F. NT suction equipment       VI. Guidelines:   Monitor patient's vital signs and evaluate patient's clinical status.  The need to                                change therapy modality may be indicated by:  Change in patient's sensorium (patient now confused or obtunded, and unable to follow directions).   A significant deterioration is evident on patient's chest radiograph or increased sputum production.  Increased thickening of secretions (e.g. mucolytic therapy may be indicated.)  Development of wheezing  Decrease in oxygen saturation  Development of chest pain.    VII.   Clinical Responsibility: The Therapy Assessment Protocol guidelines will be used to                re-evaluate all patients on bronchial hygiene therapy (See Therapy Assessment Protocol).  RCP's will perform changes in therapy according to protocol..  Bronchial hygiene therapy may be discontinued when goals of therapy are met, e.g., secretions easily expectorated for 48 hours, atelectasis is resolved, etc.  PAP Therapy may be utilized in place of IPPB therapy per discretion of the RCP, as approved by the Pulmonary  Medicine and Critical Care Committee.    VIII. Outcome Criteria:  Outcome criteria for bronchial hygiene therapy should include:  Decrease in sputum production  Improved breath sounds  Improved arterial oxygen tension and/or SaO2  Improved chest X-ray  Subjective response to therapy    IX. Documentation  Document assessment findings in the respiratory assessment section of the patient's EMR.  Document changes in therapy per protocol in the respiratory orders section and in the care plan section of the patient's EMR.  Document patient education in the patient education section of the patient's EMR.      X. Related Protocols:  Respiratory Patient Care Assessment Protocols  Aerosolized Medication Protocol  Oxygen Therapy Protocol        Reference:    L - Respiratory Care Department Policy, Procedure and Protocol Guideline Manual, 1995, J.        Tietsort.  L - Therapist Driven Respiratory Care Protocols - A Practitioner's Guide for Criteria-Based         Respiratory Care by Donato Heinz, M.D., and J. Tietsort, RN, RRT.  N - [facility] Clinical Practice Guidelines.  Amalia Hailey., Drescher, Yevonne Aline, C., O'Malley, C., volsko, T., . . . Hess, D. (2013, December). [facility] Clinical Practice Guideline: Effectiveness of Nonpharmacologic Airway Clearance Therapies in Hospitalized Patients. Respiratory Care, 58(12), [phone]. Retrieved November 17, 2017      Respiratory Care Services Policy Number: [id number]    Title: Aerosolized Medication Protocol    Effective Date: 02/1997    Revised Date: 06/13, 03/16, 11/17, 07/19  Reviewed Date: 05/14/ 03/15 , 06/17, 5/18, 03/2019   I.  Policy: The Aerosolized Medication Protocol shall by implemented by Respiratory Care Practitioners (RCP) for patients with orders to receive aerosol therapy with medication.     II. Purpose:  To open and maintain obstructed airways, the RCP, will utilize the following   protocol to select the indicated aerosolized medication(s) and determine  the most effective method of delivery to the patient.    III. Patient Type: All patients who are determined to meet aerosolized medication criteria as          outlined in this protocol.    IV. Responsibility: Director, Respiratory Care Services, registered Respiratory Care Practitioners (RCP's) with documented competency in the performance of                                     respiratory therapeutic techniques.    V. Equipment needed:  Stethoscope  Pulse oximeter  AeroEclipse nebulizer  Meter Dose Inhaler (MDI)    VI. Protocol:   The following conditions are accepted indications for aerosolized medication therapy.   Bronchospasm/wheezing  Impaired mucociliary clearance  Tracheobronchial mucosal congestion/and laryngeal stridor  Diseases which commonly require aerosolized medication therapy include, but are not limited to:  Asthma/reactive airway disease  Bronchitis/emphysema (COPD)  Cystic fibrosis  Severe laryngitis/tracheitis  Bronchiectasis  Smoke inhalation or chemical trauma to the lung or upper airway  Physical trauma to the upper airway  Laryngotracheobronchitis  Bronchiolitis  Non-specific wheezing              B. Indications for bronchodilator medications will include:  Bronchospasm/ wheezing  Asthma/reactive airway disease  Chronic obstructive pulmonary disease  Obstructive defect on pulmonary function testing  Administration of medications  If a bronchodilator or any other type of respiratory medication is needed, a physician order must be indicated in the medication section in the patient's EMR.   When the physician specifies the medication and dosage at the time of request, the ordered medication will be used as part of the care plan.  The following guidelines will be utilized in the evaluation and selection of the appropriate delivery device for indicated medication(s):  MDI is the preferred method of medication delivery via common canister.  Patient should be alert/cooperative  Able to perform 3  second breath hold.  Patient may be changed to self-administer as appropriate.   Patients in isolation will be provided an individual inhaler.  Unassisted aerosol (UA) is indicated if  Ventilation is inadequate  Patient is unable to perform MDI appropriately     VII. Guidelines:   Monitor patient's vital signs and evaluate patient's clinical status. The need to change medication and/or modality may be indicated by:  A pulse greater than 120 bpm, or if a pulse increase of 20 bpm occurs with bronchodilator medications.  Significant worsening of dyspnea or wheezing occurring during or within 30 minutes of discontinuing therapy.  Worsening of patient's sensorium (e.g. patient becomes confused or obtunded, and unable to follow directions).  Worsening of patient's chest x-ray.  Change in sputum (e.g. increased pulmonary infiltrate, which might indicate need for volume expansion therapy).  Patient has difficulty coughing up secretions, which might indicate need for acetylcysteine and/or bronchial hygiene therapy.  Call physician immediately if dyspnea worsens and is not responsive to modifications allowed by protocol.    VIII. Clinical Responsibility:  The therapy assessment guidelines will be used to  evaluate all patients receiving aerosolized medications with the exception of critical care areas.    RCP's will perform changes in therapy per protocol.   It will be the responsibility of RCP to provide instruction regarding respiratory medications, possible side effects, aerosol therapy and proper MDI technique, as well as, spacer usage to patients ordered MDI therapy.   Current therapy that is part of a patient's home regimen will not be discontinued.  Provide spacer and educate patient on proper inhaler technique if needed.    IX. Documentation  Document assessment findings in the respiratory assessment section of the patient's EMR.  Document changes in therapy per protocol in the respiratory orders section and in the  care plan section of the patient's EMR.  Document patient education in the patient education section of the patient's EMR.  X. Outcome Criteria:  Relief of wheezes and obstruction  Improved cough and sputum color and consistency  Improved chest x-ray  Improved arterial oxygen tension and or SaO2  Improved Peak Flow on asthmatic patients        XI. Related Policy/Protocols:  Respiratory Patient Care Protocols  Bronchial Hygiene Therapy  Oxygen Protocol  [facility] Administration of Metered Dose Inhalers via a Common Canister  [facility] Clinical Resources   [facility] Aerosol Generating Procedures  Reference:  L - Respiratory Care Department Policy, Procedure and Protocol Guideline Manual, 1995, J.Tietsort.  L -  Therapist Driven Respiratory Care Protocols - A Practitioner's Guide for Criteria-Based Respiratory Care by Donato Heinz, M.D., and J. Tietsort, RN, RRT.  L - The rationale for therapist-driven protocols: an update. Respiratory Care 1998; [id number].  N -[facility] Clinical Practice Guidelines.            Respiratory Care Services     Policy Number: [phone]    Title: Noninvasive Positive Pressure Ventilation, (NIPPV)    Effective Date: 10/2003, 10/2015    Revised Date: 01/2011, 11/2012, 11/2013, 05/2014, 09/2016, 08/2017    Reviewed: 01/2018, 01/2019   Description: Non Invasive Ventilation (NIV) is a ventilatory assist technique used as an alternative to endotracheal intubation. NIV is pressure ventilation delivered as BIPAP (Bi-level Positive Airway Pressure) which is a low pressure electronically driven device intended for use as a ventilatory support system for patients who have an intact respiratory drive.  The device provides non-invasive ventilatory assistance through the use of a nasal or full face mask. Bi-Level  (two levels of ventilatory support) known as inspired positive airway pressure (IPAP) and expired positive airway support (EPAP).  One level of support can also be delivered which is delivered as EPAP or CPAP  which is delivered throughout the respiratory cycle. The aim is to maintain adequate ventilation and minimize the effort of breathing. The BREAS Vivo 50, BIPAP Vision System and the Respironics V60 are the systems available for non-invasive ventilation. These devices are also capable of being used for invasive ventilation in the critical care units.      BIPAP Vision:  This device uses an electronic pressure control sensing monitor pressure differential in the patient circuit. This feedback allows for adjustment of the flow and pressure output  to assist in inhalation or exhalation through the administration at two distinct levels of positive pressure. During inspiration, the level is variably positive and is always higher than the expiratory level. During exhalation, pressure is variably positive or near ambient.  In addition, this device has the ability to compensate for leaks through automatic adjustment of the trigger threshold. This capability allows for the application of BIPAP  for mask-applied ventilation assistance.    Respironics V60  The Respironics V60 uses Auto-Trak technology to help ensure patient synchrony and therapy acceptance and is designed to address the specific challenges of NIV. By providing auto-adaptive leak compensation, inspiratory triggering, and expiratory cycling, Auto-Trak delivers optimal synchrony in the face of dynamic leak and changing patient demand. The Respironics V60 is designed to include pediatric use and is equipped with several modes, which allows the practitioner to meet the specific needs of the patients. See Appendix 1 for definitions of settings.      BREAS Vivo 50   The Vivo 50 ventilator (with or without the SpO2 and CO2 sensor) is intended to provide continuous or intermittent ventilatory support for the care of individuals who require mechanical ventilation. The Vivo 50 with the SpO2 sensor is intended to measure functional oxygen saturation of arterial  hemoglobin (%SpO2) and pulse rate. The Vivo 50 with the CO2 sensor is intended to measure CO2 in the inspiratory and expiratory gas. The device is intended to be used in home, institution, hospitals and portable applications such as wheelchairs and gurneys. It may be used for both invasive and non-invasive ventilation. Suitable for a wide range of pathologies and for patients with changing requirements over time. Multiple circuits: dual limb with exhaled volume measurements, single limb with exhalation valve or leakage port. Multiple modes - Pressure and Volume Modes with Target Volume and SIMV  Policy: The administration of non-invasive positive pressure ventilation (NPPV) will be the responsibility of the Respiratory Care Practitioner (RCP). Upon receipt of a physician order, proper patient instruction, set up and monitoring will be provided to ensure patient understanding, compliance and proper utilization of prescribed therapy.  A patient may be allowed to use their own NPPV machine after having their equipment checked and approved by [employer].   A consent and Release for Home Ventilator form must be signed by the patient and witnessed by a health care provider if the patient chooses to use his home ventilator.  A patient that is being treated for acute respiratory failure and placed on non-invasive ventilation must be placed in a critical care unit, per Medical Director.  D.  A patient who is being treated for sleep apnea may be treated on the floors.  E.   A patient has the option of using a hospital owned NPPV unit.   F.   A patient may use a hospital unit and their own mask if they choose.  G. Patients may be placed on full face mask with NPPV units on the hospital floors under the following conditions:  Patient has an end stage disease process.  Patient was placed on full face mask and NPPV unit in the Critical Care Units and it has been established as safe and effective therapy.  Patient is  ordered full face mask with NPPV unit for home use.  In the event patient is to be set up or transitioned to home on a NPPV unit, the Respironics V60 (in the AVAPS mode) shall be utilized while the patient is in the hospital. If a Respironics V60 is unavailable, a home NPPV unit may be provided by the DME provider for no more than 48 hours.                III. Responsibility: Director, Respiratory Care Services, and all Respiratory Care Practitioners with documented competency.    IV.  Indications for non-invasive ventilation:  Acute respiratory failure (Patient must be in Critical Care  Unit)  Chronic respiratory failure  Alveolar hypoventilation  Documented sleep apnea  V.   Contraindications:  Patients with severe respiratory failure without a spontaneous respiratory drive  Noninvasive ventilation may be contraindicated for patients with the following:  Inability to maintain a patent airway or adequately clear secretions  Acute sinusitis or otitis media  Risk for aspiration of gastric contents  Hypotension  Pre-existing pneumothorax or pneumomediastinum  Epistaxis  Recent facial, oral or skull surgery or trauma  history of allergy or sensitivity to mask materials where the risk from allergic reaction outweighs the benefit of ventilatory assistance  C.  Potential Complications:  Cardiovascular compromise  Skin breakdown and discomfort from mask  Gastric distention  Increased intracranial pressure  Pulmonary barotraumas    VI.  Mask fit  It is essential that the patient be correctly fitted with an appropriate size mask.  Choose mask according to sizing template on disposable setups.  The mask should fit comfortably on the patient's nose, not occluding the nares and the base of the mask should fit comfortably between the chin and bottom lip see picture on setup guide.   Headgear should fit comfortably not tight. The bottom edge of the headgear should sit at the base of the nape of the neck with side straps underneath  the earlobes.   The face masks are better for patients who are dyspneic and tachypneic as they tend to mouth breathe with a nasal mask and reduce the effectiveness of the ventilation.   Masks that are too tight are uncomfortable and may cause pressure areas. Masks that are too loose leak which reduce the efficiency of the system.   When using a nasal mask the patient must keep their mouth closed to obtain the desired effect of the ventilation.   May place an Allevyn Gentle Border dressing over bridge of nose to avoid skin breakdown.    VII.  Procedure for Non-invasive ventilation via Vivo 50:   Refer to manufacturer's recommendations.    VIII. Procedure for non-invasive ventilation using the V60 or BiPAP  Vision:            Refer to [employer] recommendations.    IX. Monitoring:  While in use, the RCP should check the patient and non-invasive unit no less than every four hours.   Failure of NIV should be suspected if:   The patient is unable to maintain adequate oxygenation, decreasing 02 saturations despite increases in 02.  There is a reduction in neurological or conscious state.  The patient has excessive secretions or increasing respiratory rate.   Failure of PaCO2 or PH to improve on ABG sample.  The patient has poorly compliant lungs.             X. Weaning               A. Weaning or cessation of non-invasive ventilation should occur under the following                        conditions:  PaC02 returns to normal and patient maintains O2 saturations with minimal                oxygen.  Respiratory rate is returned within normal limits.  Patient is unable to tolerate non-invasive ventilation.  Patient's dyspnea is reduced.  Patient exhibits normal overnight ventilation.  Patient is receiving palliative treatment.    XI. Documentation:  Documentation should include the following:  Ventilator settings comply with  physician order.  Ventilator is functioning properly as evidenced by a check of measured  volumes, rates, pressures and FIO2.  Alarms are set appropriately.  Measured inspired gas temperature (invasive mode only)  Vital signs (pulse, respiratory rate, oxygen saturation, breath sounds)  Patient tolerance to therapy.  Manual resuscitator and appropriate size mask at patient bedside      REFERENCES  Respironics V60 user Manual ..\.Marland Kitchen\Equipment\Respironics-V60-Users-Manual.pdf    VIVO 50 clinical Guide .Marland Kitchen\.Marland Kitchen\Equipment\VIVO 50 [email or web address]     [employer] [facility] [employer] Critical Care Therapy and Respiratory Care Section.    Fraser Din, R. 2001.  Introducing non-invasive positive pressure ventilation. Nursing Standard.15,26, 42-45.     Woodrow, P. 2003.  Using non-invasive ventilation in acute wards: part 2.Nursing Standard.18,1, 41-44.     Marshall A 1999.  Use of continuous positive airway pressure (CPAP) in the critically ill-physiological principles. Critical Care [phone]     Dayton Scrape, S.2002. Bi-level positive airway pressure (BiPAP) and acute cardiogenicpulmonary edema   ( ACPO) in the emergency department. Critical Care 15 (2):51-63        DEFINITIONS AND USUAL SETTINGS                                                            APPENDIX 1                      Settings   Settings in  Active   CPAP Settings in  Active   BiPAP Description Range Usual Setting  Used in NIV         CPAP Mode                     Continuous Positive Airway  Pressure   4-24 cmH20 8-14     ST or  BiPAP MODE                           Spontaneous Timed or BiLevel Positive Airway Pressure Ventilation. Two level system of alternating during non- invasive ventilation in sync with breathing-set IPAP and EPAP      __     __   AVAPS Mode    (only available  on V60)          The volume-targeted AVAPS (average volume-assured pressure support) mode combines the attributes of pressure-controlled and volume-targeted ventilation.      __     __       EPAP                                      Expired  Positive Airway Pressure. Recruits under ventilated alveoli to remain open during expiration by providing a constant pressure throughout resp. cycle. Must be less than or equal to IPAP    4-25 cmH20 5-14                 Settings Settings in Active CPAP Settings in Active BiPAP Description Range Usual Setting Used in NIV     IPAP                           Inspired  Positive Airway Pressure provides pressure throughout the inspiratory phase to support patient ventilation  4-40 cmH20 10-20               I-time                    Inspiratory time- time taken to inspire in seconds  0.3 - 3.0 sec 1:3   FIO2                   Oxygen delivered  21- 100% As ordered         Ramp time                                      The Ramp Time function helps your patient adapt to ventilation by gradually increasing inspiratory and expiratory pressure over a set interval (minutes). This time gradually delivers pressures so to reduce patient anxiety and increases comfort. Off  or  5-45 minutes 10 minutes                   Rate (RR)          Patient's respiratory rate 4- 60 BPM 4     Rise time          Speed at which the inspiratory pressure rises to the set pressure.  1-5  (1 is the fastest) Set at 3         Patient Data  Data Description Range Usual setting in NIV   Breath phase/trigger Indicator  Bar in left hand corner. Colored according to breath trigger.  n/a n/a   PIP Positive Inspiratory pressure  0-50 n/a   Patient total leak Est. or unintentional leak  0-200 n/a   Patient trigger Patient triggered breaths as a percentage  0-100% Should be 100%   Respiratory rate Respiratory rate 0-90 n/a   Ti/Ttot Inspiratory duty cycle or inspiratory time divided by total cycle time  0-91% n/a   Minute volume Est. minute ventilation. TV x rate=MV  0-99 LPM n/a   Tidal volume Est. expired tidal volume  0-3000 ml n/a     Alarms  Alarm Description Range Set  at   [facility] Rate High respiratory rate 5-90 10 breaths above patient's breath rate   Low Rate  Low respiratory rate alarm 1-89 BPM 5 breaths below patient's breath rate   Hi VT High tidal volume alarm [phone] ml 200 ml above patient's own   Low VT Low tidal volume alarm OFF - 1500 ml OFF   HIP High Inspiratory pressure alarm 5-50 cm H20 10 cm above IPAP   LIP Low inspiratory pressure alarm OFF, 1-40 cmH20 OFF   Lo VE Low minute vent alarm OFF, 0.1 to 99L/min OFF   LIP T Low inspiratory delay time 5-60 seconds 5 seconds       CPAP Settings BiPAP Settings     Set CPAP level as ordered Set breath rate as ordered     Set FIO2 as ordered Set I-time as 1.3     Set Ramp time as ordered Set EPAP as ordered     Set C-Flex at 3 Set IPAP as ordered      Set Rise time as ordered      Set FIO2 as ordered           [facility]  Consent and Release for Home Ventilator  Patient Name: _________________________________________ Room: ___________    SSN: _______________________________           Account Number:  __________________    Use and care of my personal home ventilator, (invasive or non-invasive) mechanical life support device while at [facility], The system has been discussed with me by my physician and I have been provided with an opportunity to ask questions which have been answered to my satisfaction. I also understand a respiratory care practitioner is not expected to remain in my room or general vicinity at all times.    It is understood that every precaution consistent with the best medical practice will be taken and I give [facility] permission to monitor my ventilator during my hospital stay.    I hereby release [employer] system, it's agents, employees and medical staff from liability arising from any and all claims, demands, losses and damages to person and/or property as a result of the above mentioned requests. I certify that I have read and understand this consent agreement and release and that I am legally qualified to grant this  authorization.    ___________________  Date    _____________________________________        ________________________  Signature of Patient or Parent (of minor patient,                  Relationship (if other than Patient)  Custodial parent) if applicable. Closest Relative,  Guardian or legal Representative    _____________________________________  Witness  Legal representative is defined as person named by the court as a guardian, Financial risk analyst, or having power of attorney specifically set forth to authorize this action.    [facility] [id number] (3/02, 7/14)   Consent and Release for Home Ventilator

## 2023-07-09 NOTE — Progress Notes (Signed)
Hospitalist Progress Note   Admit Date:  07/04/2023 11:46 AM   Name:  Paul Medina   Age:  89 y.o.  Sex:  male  DOB:  07-16-1930   MRN:  [id number]   Room:  357/01    Presenting/Chief Complaint: Shortness of Breath     Reason(s) for Admission: Severe sepsis (HCC) [A41.9, R65.20]  Pneumonia of right lung due to infectious organism, unspecified part of lung [J18.9]  Sepsis, due to unspecified organism, unspecified whether acute organ dysfunction present [facility]) [A41.9]     Hospital Course:   Paul Medina is a 89 y.o. male with chronic combined heart failure, PAF on Eliquis, HTN, CAD, history of TIA, left eye blindness, OSA on CPAP, and CKD stage III admitted on 2/11 due to severe sepsis and acute respiratory failure from bilateral pneumonia consistent with aspiration and hospital-acquired pneumonia.  He also had recently been diagnosed with COVID and treated.    Subjective & 24hr Events:   I saw him sitting on the side of the bed.  He states he feels weak today.  Still with continued cough.  Denies chest pain.  Denies shortness of breath.  Has not gotten up and moved nor worked with physical therapy.  Denies N/V.  Denies fevers.    Assessment & Plan:     Principal Problem:    Severe sepsis (HCC)    Bilateral pneumonia    Aspiration pneumonia (HCC)  Resolving  Oxygen needs improving  Change Zosyn to Augmentin 500 mg twice daily  Start azithromycin 250 mg daily x 5 days  No sputum culture gotten  Blood cultures NGTD  UA unremarkable  2/11 CT chest with worsening right sided infiltrates and improving left sided infiltrates  Lactic acid normalized    Active Problems:    Acute respiratory failure with hypoxia  Patient down to 2 L nasal cannula  Continue current pneumonia treatment  Continue saline nebulized twice daily  Continue Brovana 15 mcg twice daily  Continue DuoNeb 4 times daily  Continue Mucinex 600 mg twice daily  Wean oxygen as appropriate      HTN (hypertension)  Blood pressure stable  Continue Coreg  6.25 mg twice daily      Stage 3 chronic kidney disease (HCC)  Creatinine slowly rising  Continue to hold Lasix  Check BMP daily      COVID  Patient diagnosed on 2/5  Check CRP  If elevated, may benefit from Decadron      Chronic combined systolic and diastolic heart failure (HCC)  I do not suspect patient to be in acute exacerbation  Continue Coreg 6.25 mg twice daily  Continue to hold Lasix      Paroxysmal atrial fibrillation (HCC)  Continue Coreg 6.25 mg twice daily  Continue Eliquis 2.5 mg twice daily      Coronary atherosclerosis of native coronary artery  Denies chest pain  Continue Eliquis 2.5 mg twice daily      History of TIA (transient ischemic attack)      OSA (obstructive sleep apnea)  Continue CPAP at night      History of B-cell lymphoma      DNR (do not resuscitate)        Anticipated Discharge Arrangements:   Home Health    PT/OT evals ordered?  Therapy evals ordered  Diet:  ADULT ORAL NUTRITION SUPPLEMENT; Breakfast, Lunch, Dinner; Educational psychologist Protein Oral Supplement  ADULT DIET; Dysphagia - Minced and Moist; 1500 ml; No Drinking Straws  VTE prophylaxis:  Already on anticoagulation  Code status: DNR      Non-peripheral Lines and Tubes (if present):          Telemetry (if present):  Cardiac/Telemetry Monitor On: No        Hospital Problems:  Principal Problem:    Severe sepsis (HCC)  Active Problems:    Bilateral pneumonia    Acute respiratory failure with hypoxia    Aspiration pneumonia (HCC)    HTN (hypertension)    Stage 3 chronic kidney disease (HCC)    COVID    Chronic combined systolic and diastolic heart failure (HCC)    Atrial fibrillation (HCC)    HLD (hyperlipidemia)    Coronary atherosclerosis of native coronary artery    History of TIA (transient ischemic attack)    OSA (obstructive sleep apnea)    History of B-cell lymphoma    DNR (do not resuscitate)  Resolved Problems:    * No resolved hospital problems. *      Objective:   Patient Vitals for the past 24 hrs:   Temp Pulse  Resp BP SpO2   07/09/23 0757 97.7 F (36.5 C) (!) 101 17 125/77 93 %   07/09/23 0754 -- 100 -- -- 93 %   07/08/23 2117 -- 84 20 -- 95 %   07/08/23 2011 97.9 F (36.6 C) 75 17 123/65 97 %   07/08/23 1630 98.1 F (36.7 C) 78 -- (!) 106/58 97 %       Oxygen Therapy  SpO2: 93 %  Pulse via Oximetry: 83 beats per minute  Pulse Oximeter Device Mode: Intermittent  Pulse Oximeter Device Location: Finger  O2 Device: None (Room air)  O2 Flow Rate (L/min): 3 L/min    Estimated body mass index is 29.05 kg/m as calculated from the following:    Height as of this encounter: 1.676 m (5\' 6" ).    Weight as of this encounter: 81.6 kg (180 lb).    Intake/Output Summary (Last 24 hours) at 07/09/2023 1024  Last data filed at 07/09/2023 0601  Gross per 24 hour   Intake --   Output 1175 ml   Net -1175 ml         Physical Exam:   General:    Appears worn out and frail  Head:  Normocephalic, atraumatic  Eyes:  Sclerae appear normal.  Pupils equally round.  ENT:  Nares appear normal.  Moist oral mucosa  Neck:  No restricted ROM.  Trachea midline   CV:   RRR.  No m/r/g.  No jugular venous distension.  Lungs:   Bilateral scattered rhonchi with coarse wheezing.  Symmetric expansion.  Abdomen:   Soft, nontender, nondistended.  Extremities: No cyanosis or clubbing.  No edema  Skin:     No rashes.  Normal coloration.   Warm and dry.    Neuro:  CN II-XII grossly intact.    Psych:  Depressed affect    Imaging:    Chest X-Ray 2/11 personally reviewed and interpreted by me        I have personally reviewed labs and tests:  Recent Labs:  Recent Results (from the past 48 hour(s))   CBC with Auto Differential    Collection Time: 07/08/23  5:34 AM   Result Value Ref Range    WBC 14.1 (H) 4.3 - 11.1 K/uL    RBC 3.14 (L) 4.23 - 5.6 M/uL    Hemoglobin 9.2 (L) 13.6 - 17.2 g/dL    Hematocrit 16.1 (L) 41.1 -  50.3 %    MCV 85.4 82.0 - 102.0 FL    MCH 29.3 26.1 - 32.9 PG    MCHC 34.3 31.4 - 35.0 g/dL    RDW 16.1 (H) 09.6 - 14.6 %    Platelets 153 150 - 450 K/uL     MPV 13.0 (H) 9.4 - 12.3 FL    nRBC 0.00 0.0 - 0.2 K/uL    Neutrophils % 79.7 (H) 43.0 - 78.0 %    Lymphocytes % 3.9 (L) 13.0 - 44.0 %    Monocytes % 10.4 4.0 - 12.0 %    Eosinophils % 0.0 (L) 0.5 - 7.8 %    Basophils % 0.3 0.0 - 2.0 %    Immature Granulocytes % 5.7 (H) 0.0 - 5.0 %    Neutrophils Absolute 11.24 (H) 1.70 - 8.20 K/UL    Lymphocytes Absolute 0.55 0.50 - 4.60 K/UL    Monocytes Absolute 1.47 (H) 0.10 - 1.30 K/UL    Eosinophils Absolute 0.00 0.00 - 0.80 K/UL    Basophils Absolute 0.04 0.00 - 0.20 K/UL    Immature Granulocytes Absolute 0.80 (H) 0.0 - 0.5 K/UL    RBC Comment SLIGHT  SCHISTOCYTES        RBC Comment SLIGHT  POLYCHROMASIA        RBC Comment SLIGHT  OVALOCYTES        WBC Comment Result Confirmed By Smear      Platelet Comment LARGE FORMS PRESENT      Differential Type AUTOMATED     Basic Metabolic Panel w/ Reflex to MG    Collection Time: 07/08/23  5:34 AM   Result Value Ref Range    Sodium 139 136 - 145 mmol/L    Potassium 4.4 3.5 - 5.1 mmol/L    Chloride 107 98 - 107 mmol/L    CO2 21 20 - 29 mmol/L    Anion Gap 11 7 - 16 mmol/L    Glucose 158 (H) 70 - 99 mg/dL    BUN 52 (H) 8 - 23 MG/DL    Creatinine 0.45 (H) 0.80 - 1.30 MG/DL    Est, Glom Filt Rate 37 (L) >60 ml/min/1.87m2    Calcium 8.7 (L) 8.8 - 10.2 MG/DL   Brain Natriuretic Peptide    Collection Time: 07/08/23  5:34 AM   Result Value Ref Range    NT Pro-BNP 6,020 (H) 0 - 450 PG/ML   CBC with Auto Differential    Collection Time: 07/09/23  5:25 AM   Result Value Ref Range    WBC 14.4 (H) 4.3 - 11.1 K/uL    RBC 3.43 (L) 4.23 - 5.6 M/uL    Hemoglobin 9.9 (L) 13.6 - 17.2 g/dL    Hematocrit 40.9 (L) 41.1 - 50.3 %    MCV 86.3 82.0 - 102.0 FL    MCH 28.9 26.1 - 32.9 PG    MCHC 33.4 31.4 - 35.0 g/dL    RDW 81.1 (H) 91.4 - 14.6 %    Platelets 140 (L) 150 - 450 K/uL    MPV Unable to calculate. Recommend adding IPF. 9.4 - 12.3 FL    nRBC 0.00 0.0 - 0.2 K/uL    Neutrophils % 77.1 43.0 - 78.0 %    Lymphocytes % 4.4 (L) 13.0 - 44.0 %    Monocytes %  13.0 (H) 4.0 - 12.0 %    Eosinophils % 0.0 (L) 0.5 - 7.8 %    Basophils % 0.3 0.0 - 2.0 %  Immature Granulocytes % 5.2 (H) 0.0 - 5.0 %    Neutrophils Absolute 11.11 (H) 1.70 - 8.20 K/UL    Lymphocytes Absolute 0.63 0.50 - 4.60 K/UL    Monocytes Absolute 1.87 (H) 0.10 - 1.30 K/UL    Eosinophils Absolute 0.00 0.00 - 0.80 K/UL    Basophils Absolute 0.04 0.00 - 0.20 K/UL    Immature Granulocytes Absolute 0.75 (H) 0.0 - 0.5 K/UL    RBC Comment SLIGHT  OVALOCYTES        RBC Comment SLIGHT  POIKILOCYTOSIS        RBC Comment SLIGHT  ANISOCYTOSIS        RBC Comment SLIGHT  SCHISTOCYTES        WBC Comment SLIGHT      Platelet Comment ADEQUATE      Differential Type MANUAL     Basic Metabolic Panel w/ Reflex to MG    Collection Time: 07/09/23  5:25 AM   Result Value Ref Range    Sodium 140 136 - 145 mmol/L    Potassium 4.1 3.5 - 5.1 mmol/L    Chloride 103 98 - 107 mmol/L    CO2 22 20 - 29 mmol/L    Anion Gap 15 7 - 16 mmol/L    Glucose 125 (H) 70 - 99 mg/dL    BUN 57 (H) 8 - 23 MG/DL    Creatinine 1.61 (H) 0.80 - 1.30 MG/DL    Est, Glom Filt Rate 34 (L) >60 ml/min/1.75m2    Calcium 9.0 8.8 - 10.2 MG/DL       No results for input(s): "COVID19" in the last 72 hours.    Current Meds:  Current Facility-Administered Medications   Medication Dose Route Frequency    amoxicillin-clavulanate (AUGMENTIN) 875-125 MG per tablet 1 tablet  1 tablet Oral 2 times per day    azithromycin (ZITHROMAX) tablet 250 mg  250 mg Oral Daily    nystatin (MYCOSTATIN) 100000 UNIT/ML suspension 500,000 Units  5 mL Oral 4x Daily    ipratropium 0.5 mg-albuterol 2.5 mg (DUONEB) nebulizer solution 1 Dose  1 Dose Inhalation TID RT    apixaban (ELIQUIS) tablet 2.5 mg  2.5 mg Oral 2 times per day    rosuvastatin (CRESTOR) tablet 10 mg  10 mg Oral Nightly    sodium chloride (Inhalant) 3 % nebulizer solution 4 mL  4 mL Nebulization PRN    arformoterol tartrate (BROVANA) nebulizer solution 15 mcg  15 mcg Nebulization BID RT    sodium chloride (Inhalant) 3 %  nebulizer solution 4 mL  4 mL Nebulization BID RT    prednisoLONE acetate (PRED FORTE) 1 % ophthalmic suspension 1 drop  1 drop Left Eye 6 times per day    atropine 1 % ophthalmic solution 1 drop  1 drop Left Eye Nightly    brimonidine (ALPHAGAN) 0.2 % ophthalmic solution 1 drop  1 drop Left Eye TID    sodium chloride flush 0.9 % injection 5-40 mL  5-40 mL IntraVENous 2 times per day    sodium chloride flush 0.9 % injection 5-40 mL  5-40 mL IntraVENous PRN    0.9 % sodium chloride infusion   IntraVENous PRN    potassium chloride (KLOR-CON M) extended release tablet 40 mEq  40 mEq Oral PRN    Or    potassium bicarb-citric acid (EFFER-K) effervescent tablet 40 mEq  40 mEq Oral PRN    Or    potassium chloride 10 mEq/100 mL IVPB (Peripheral Line)  10  mEq IntraVENous PRN    magnesium sulfate 2000 mg in 50 mL IVPB premix  2,000 mg IntraVENous PRN    ondansetron (ZOFRAN-ODT) disintegrating tablet 4 mg  4 mg Oral Q8H PRN    Or    ondansetron (ZOFRAN) injection 4 mg  4 mg IntraVENous Q6H PRN    polyethylene glycol (GLYCOLAX) packet 17 g  17 g Oral Daily PRN    acetaminophen (TYLENOL) tablet 650 mg  650 mg Oral Q6H PRN    Or    acetaminophen (TYLENOL) suppository 650 mg  650 mg Rectal Q6H PRN    guaiFENesin (MUCINEX) extended release tablet 600 mg  600 mg Oral BID    carvedilol (COREG) tablet 6.25 mg  6.25 mg Oral BID WC    sodium bicarbonate tablet 650 mg  650 mg Oral 4x Daily    doxazosin (CARDURA) tablet 4 mg  4 mg Oral Daily    pantoprazole (PROTONIX) tablet 40 mg  40 mg Oral QAM AC    [Held by provider] furosemide (LASIX) tablet 20 mg  20 mg Oral Daily       Signed:  Cammy Copa, DO    Part of this note may have been written by using a voice dictation software.  The note has been proof read but may still contain some grammatical/other typographical errors.

## 2023-07-10 LAB — CBC
Hematocrit: 26.7 % — ABNORMAL LOW (ref 41.1–50.3)
Hemoglobin: 9.3 g/dL — ABNORMAL LOW (ref 13.6–17.2)
MCH: 29.4 pg (ref 26.1–32.9)
MCHC: 34.8 g/dL (ref 31.4–35.0)
MCV: 84.5 fL (ref 82.0–102.0)
MPV: 13 fL — ABNORMAL HIGH (ref 9.4–12.3)
Platelets: 151 10*3/uL (ref 150–450)
RBC: 3.16 M/uL — ABNORMAL LOW (ref 4.23–5.6)
RDW: 15.2 % — ABNORMAL HIGH (ref 11.9–14.6)
WBC: 16 10*3/uL — ABNORMAL HIGH (ref 4.3–11.1)
nRBC: 0 10*3/uL (ref 0.0–0.2)

## 2023-07-10 LAB — BASIC METABOLIC PANEL W/ REFLEX TO MG FOR LOW K
Anion Gap: 14 mmol/L (ref 7–16)
BUN: 49 mg/dL — ABNORMAL HIGH (ref 8–23)
CO2: 21 mmol/L (ref 20–29)
Calcium: 8.4 mg/dL — ABNORMAL LOW (ref 8.8–10.2)
Chloride: 106 mmol/L (ref 98–107)
Creatinine: 1.66 mg/dL — ABNORMAL HIGH (ref 0.80–1.30)
Est, Glom Filt Rate: 39 mL/min/{1.73_m2} — ABNORMAL LOW (ref >60)
Glucose: 106 mg/dL — ABNORMAL HIGH (ref 70–99)
Potassium: 3.9 mmol/L (ref 3.5–5.1)
Sodium: 141 mmol/L (ref 136–145)

## 2023-07-10 LAB — VITAMIN D 25 HYDROXY: Vit D, 25-Hydroxy: 65.1 ng/mL (ref 30.0–100.0)

## 2023-07-10 LAB — C-REACTIVE PROTEIN: CRP: 6.8 mg/dL — ABNORMAL HIGH (ref 0.0–0.4)

## 2023-07-10 MED FILL — DOXAZOSIN MESYLATE 4 MG PO TABS: 4 MG | ORAL | Qty: 1

## 2023-07-10 MED FILL — NYSTATIN 100000 UNIT/ML MT SUSP: 100000 UNIT/ML | OROMUCOSAL | Qty: 5

## 2023-07-10 MED FILL — GUAIFENESIN ER 600 MG PO TB12: 600 MG | ORAL | Qty: 1

## 2023-07-10 MED FILL — ARFORMOTEROL TARTRATE 15 MCG/2ML IN NEBU: 152 MCG/2ML | RESPIRATORY_TRACT | Qty: 2

## 2023-07-10 MED FILL — ROSUVASTATIN CALCIUM 5 MG PO TABS: 5 MG | ORAL | Qty: 2

## 2023-07-10 MED FILL — AMOXICILLIN-POT CLAVULANATE 500-125 MG PO TABS: 500-125 MG | ORAL | Qty: 1

## 2023-07-10 MED FILL — PANTOPRAZOLE SODIUM 40 MG PO TBEC: 40 MG | ORAL | Qty: 1

## 2023-07-10 MED FILL — CARVEDILOL 6.25 MG PO TABS: 6.25 MG | ORAL | Qty: 1

## 2023-07-10 MED FILL — IPRATROPIUM-ALBUTEROL 0.5-2.5 (3) MG/3ML IN SOLN: 0.5-2.533 (3) MG/3ML | RESPIRATORY_TRACT | Qty: 3

## 2023-07-10 MED FILL — SODIUM CHLORIDE 3 % IN NEBU: 3 % | RESPIRATORY_TRACT | Qty: 4

## 2023-07-10 MED FILL — SODIUM BICARBONATE 650 MG PO TABS: 650 MG | ORAL | Qty: 1

## 2023-07-10 MED FILL — FUROSEMIDE 20 MG PO TABS: 20 MG | ORAL | Qty: 1

## 2023-07-10 MED FILL — ELIQUIS 2.5 MG PO TABS: 2.5 MG | ORAL | Qty: 1

## 2023-07-10 MED FILL — AZITHROMYCIN 250 MG PO TABS: 250 MG | ORAL | Qty: 1

## 2023-07-10 NOTE — Progress Notes (Signed)
ACUTE OCCUPATIONAL THERAPY GOALS:   (Developed with and agreed upon by patient and/or caregiver.)  1. Patient will perform grooming with SPV and adaptive equipment as needed.  2. Patient will perform upper body dressing with SPV and adaptive equipment as needed.  3. Patient will perform lower body dressing with SPV and adaptive equipment as needed.  4. Patient will perform bathing with SPV and adaptive equipment as needed.  5. Patient will perform toileting and toilet transfer with SPV and adaptive equipment as needed.  6. Patient will perform ADL functional mobility and tranfers in room with SPV and adaptive equipment as needed.  7. Patient/family to demonstrate knowledge of home safety and DME recommendations.    Timeframe: 7 visits     OCCUPATIONAL THERAPY Daily Note and AM       OT Visit Days: 3  Acknowledge Orders  Time  OT Charge Capture  Rehab Caseload Tracker      Paul Medina is a 89 y.o. male   PRIMARY DIAGNOSIS: Severe sepsis (HCC)  Severe sepsis (HCC) [A41.9, R65.20]  Pneumonia of right lung due to infectious organism, unspecified part of lung [J18.9]  Sepsis, due to unspecified organism, unspecified whether acute organ dysfunction present (HCC) [A41.9]       Reason for Referral: Generalized Muscle Weakness (M62.81)  Other lack of cordination (R27.8)  Difficulty in walking, Not elsewhere classified (R26.2)  Dizziness and Giddiness (R42)  Inpatient: Payor: [employer] / Plan: [employer] / Product Type: *No Product type* /     ASSESSMENT:     REHAB RECOMMENDATIONS:   Recommendation to date pending progress:  Setting:  Short-term Rehab  Continued occupational therapy recommended at discharge    Equipment:    None     ASSESSMENT:  Paul Medina is admitted for the above diagnoses and presents with overall deficits in strength, functional mobility and ADL performance.  He lives alone in a senior living apartment community with level entry. At baseline (prior to recent hospitalizations), he reports  independence with ADLs and mobility (sometimes uses rollator). Today, he was able to perform bed mobility, household mobility, and grooming tasks seated in recliner with setup. Patient has been using urinal for toileting--donned new brief at EOB. He remains on 3L NC during session with episodes of wet/productive sounding cough. SpO2 97%. Presents with decreased activity tolerance impacting his ability to safely and independently perform ADLs. He was left in recliner chair with all needs met and in reach. Xray tech at bedside. Pt is functioning below their baseline and will benefit from skilled OT during hospital stay. Anticipate pt will benefit from continued therapy upon discharge.     07/06/2023: Patient upon arrival reclined in bed, on 3L of O2 reports shortness of breath, spO2 95%. Patient completed supine to sit with stand by assist. Patient stood with rolling walker with stand by assist and performed functional mobility with rolling walker stand by assist to sit in bedside chair. Patient needed extended time to complete all tasks today. RT notified. Lengthy discussion with patient about discharge planning, patient is confused, he did not want to have this conversation at this time. Patient will continue to benefit from skilled OT services to maximize independence and safety with ADL tasks.     07/10/23 900am Patient supine in bed on 3 LPM nc. MD present in room to encourage oob activity. Patient agreeable O2 sat 91-93%. He rolled right and left with contact guard assist and was assisted with rear peri care and brief change.  He sat on the eob with min assist and sat was 85% but rebounded quickly with breathing through his nose to low 90's. He drank with mod cues for small sips and chin tuck. He stood and took steps to recliner with min assist with [employer]. He was set up in recliner and was educated on OT poc and recommendations.  He was agreeable to SHORT TERM REHAB as he is not at his baseline and lives  alone. Tab alert in place. HR  varied during the session and nursing advised. Will follow.       [employer] AM-PACT "6 Clicks" Daily Activity Inpatient Short Form:    AM-PAC Daily Activity - Inpatient   How much help is needed for putting on and taking off regular lower body clothing?: A Lot  How much help is needed for bathing (which includes washing, rinsing, drying)?: A Lot  How much help is needed for toileting (which includes using toilet, bedpan, or urinal)?: A Little  How much help is needed for putting on and taking off regular upper body clothing?: None  How much help is needed for taking care of personal grooming?: None  How much help for eating meals?: None  AM-PAC Inpatient Daily Activity Raw Score: 19  AM-PAC Inpatient ADL T-Scale Score : 40.22  ADL Inpatient CMS 0-100% Score: 42.8  ADL Inpatient CMS G-Code Modifier : CK           SUBJECTIVE:     Paul Medina stated he was fatigued    Social/Functional Lives With: Alone  Type of Home: Senior housing apartment  Home Layout: One level  Home Access: Level entry, Occupational psychologist Shower/Tub: Pension scheme manager: Midwife: Grab bars in shower, Grab bars around toilet  Bathroom Accessibility: Accessible  Home Equipment: Rollator  Receives Help From: Family  Prior Level of Assist for ADLs: Independent  Prior Level of Assist for Homemaking: Independent  Prior Level of Assist for Transfers: Independent  Occupation: Retired    OBJECTIVE:     LINES / DRAINS / AIRWAY: None    RESTRICTIONS/PRECAUTIONS:  Restrictions/Precautions: Isolation, Fall Risk, Bed Alarm    PAIN: VITALS / O2:   Pre Treatment:       none    Post Treatment: none       Vitals          Oxygen   3L NC    85 % with sitting up and transfers but rebounded quickly.  High 90' s at rest in recliner after tranfer.      GROSS EVALUATION: INTACT IMPAIRED   (See Comments)   UE AROM [x]  []    UE PROM [x]  []    Strength []   Generalized weakness     Posture / Balance []    Fair - using RW for functional mobility and transfers   Sensation [x]      Coordination [x]        Tone [x]        Edema []     Activity Tolerance []   Decreased from baseline     Hand Dominance R []  L []       COGNITION/  PERCEPTION: INTACT IMPAIRED   (See Comments)   Orientation [x]      Vision [x]      Hearing []   Hearing aides B    Cognition  [x]   Follows simple directions   Perception []   impaired     MOBILITY: I Mod I S SBA CGA Min Mod Max Total  NT x2 Comments:   Bed Mobility    Rolling []  []  []  []  [x]  []  []  []  []  []  []     Supine to Sit []  []  []  []  []  [x]  []  []  []  []  []     Scooting []  []  []  []  []  []  []  []  []  []  []     Sit to Supine []  []  []  []  []  []  []  []  []  [x]  []     Transfers    Sit to Stand []  []  []  []  []  [x]  []  []  []  []  []     Bed to Chair []  []  []  []  []  [x]  []  []  []  []  []  RW   Stand to Sit []  []  []  []  []  [x]  []  []  []  []  []     Tub/Shower []  []  []  []  []  []  []  []  []  []  []      Toilet []  []  []  []  []  []  []  []  []  []  []       []  []  []  []  []  []  []  []  []  []  []     I=Independent, Mod I=Modified Independent, S=Supervision/Setup, SBA=Standby Assistance, CGA=Contact Guard Assistance, Min=Minimal Assistance, Mod=Moderate Assistance, Max=Maximal Assistance, Total=Total Assistance, NT=Not Tested    ACTIVITIES OF DAILY LIVING: I Mod I S SBA CGA Min Mod Max Total NT Comments   BASIC ADLs:              Upper Body Bathing  []  []  []  []  []  []  []  []  []  []      Lower Body Bathing []  []  []  []  []  []  []  []  []  []      Toileting []  []  []  []  []  []  []  []  []  []     Upper Body Dressing []  []  []  []  []  []  []  []  []  []     Lower Body Dressing []  []  []  []  []  []  []  []  []  []     Feeding []  []  []  []  []  []  []  []  []  []     Grooming []  []  []  []  []  []  []  []  []  []     Personal Device Care []  []  []  []  []  []  []  []  []  []     Functional Mobility []  []  []  [x]  [x]  []  []  []  []  []  Mobility in room, additional time needed, RW   I=Independent, Mod I=Modified Independent, S=Supervision/Setup, SBA=Standby Assistance, CGA=Contact Guard Assistance, Min=Minimal Assistance,  Mod=Moderate Assistance, Max=Maximal Assistance, Total=Total Assistance, NT=Not Tested    PLAN:   FREQUENCY/DURATION     for duration of hospital stay or until stated goals are met, whichever comes first.    PROBLEM LIST:   (Skilled intervention is medically necessary to address:)  Decreased ADL/Functional Activities  Decreased Activity Tolerance  Decreased Balance  Decreased Gait Ability  Decreased Safety Awareness  Decreased Strength  Decreased Transfer Abilities  Increased Pain   INTERVENTIONS PLANNED:  (Benefits and precautions of occupational therapy have been discussed with the patient.)  Self Care Training  Therapeutic Activity  Therapeutic Exercise/HEP  Neuromuscular Re-education  Manual Therapy  Education         TREATMENT:     TREATMENT:   SELF CARE: (30 minutes):   Procedure(s) (per grid) utilized to improve and/or restore self-care/home management as related to dressing, toileting, grooming, self feeding, and functional mobility  . Required mod visual, verbal, manual, and tactile cueing to facilitate activities of daily living skills, compensatory activities, and OT poc, discharge plan, breathing techniques and feeding safety .      TREATMENT GRID:  N/A    AFTER TREATMENT PRECAUTIONS: Alarm Activated, Bed/Chair Locked, Call light within reach, Chair, Needs  within reach, and RN notified    INTERDISCIPLINARY COLLABORATION:  MD/ PA/ NP , RN/ PCT, PT/ PTA, and OT/ COTA    EDUCATION:  Education Given To: Patient  Education Provided: Role of Therapy;Plan of Care;Transfer Training;ADL Adaptive Strategies;Energy Conservation;IADL Safety;Mobility Training;Fall Prevention Strategies;Equipment  Education Method: Demonstration;Verbal  Barriers to Learning: Hearing  Education Outcome: Verbalized understanding;Demonstrated understanding;Continued education needed    TOTAL TREATMENT DURATION AND TIME:  Time In: 0900  Time Out: 0930  Minutes: 30    Hilario Quarry, OT

## 2023-07-10 NOTE — Progress Notes (Addendum)
Hospitalist Progress Note   Admit Date:  07/04/2023 11:46 AM   Name:  Paul Medina   Age:  89 y.o.  Sex:  male  DOB:  [birth year]   MRN:  [id number]   Room:  357/01    Presenting/Chief Complaint: Shortness of Breath     Reason(s) for Admission: Severe sepsis (HCC) [A41.9, R65.20]  Pneumonia of right lung due to infectious organism, unspecified part of lung [J18.9]  Sepsis, due to unspecified organism, unspecified whether acute organ dysfunction present [facility]) [A41.9]     Hospital Course:   Paul Medina is a 89 y.o. male with chronic combined heart failure, PAF on Eliquis, HTN, CAD, history of TIA, left eye blindness, OSA on CPAP, and CKD stage III admitted on 2/11 due to severe sepsis and acute respiratory failure from bilateral pneumonia consistent with aspiration and hospital-acquired pneumonia.  He also had recently been diagnosed with COVID and treated.    Subjective & 24hr Events:   I saw him lying in bed.  States that he continues to feel weak, but is ready to work with therapy today.  States he wants to get to the chair.  Denies chest pain.  Shortness of breath improved.  Cough improved.  Denies fevers.  Denies N/V.    Assessment & Plan:     Principal Problem:    Severe sepsis (HCC)    Bilateral pneumonia    Aspiration pneumonia (HCC)  Resolving  2/17 oxygen needs the same as yesterday  Changed Zosyn to Augmentin 500 mg twice daily on 2/16-EOT 2/20  Continue azithromycin 250 mg daily x 5 days-EOT 2/19  No sputum culture gotten  Blood cultures NGTD  UA unremarkable  2/11 CT chest with worsening right sided infiltrates and improving left sided infiltrates  Lactic acid normalized    Active Problems:    Acute respiratory failure with hypoxia  Patient down to 3 L nasal cannula  Continue current pneumonia treatment  Continue saline nebulized twice daily  Continue Brovana 15 mcg twice daily  Continue DuoNeb 4 times daily  Continue Mucinex 600 mg twice daily  Wean oxygen as appropriate      HTN  (hypertension)  Blood pressure stable  Continue Coreg 6.25 mg twice daily      Stage 3 chronic kidney disease (HCC)  Creatinine slowly rising  Continue to hold Lasix  Check BMP daily      COVID  Patient diagnosed on 2/5  2/16 CRP 6.8 => not a candidate for baricitinib  Would not start Decadron since patient is improving      Chronic combined systolic and diastolic heart failure (HCC)  I do not suspect patient to be in acute exacerbation  Continue Coreg 6.25 mg twice daily  Continue to hold Lasix      Paroxysmal atrial fibrillation (HCC)  Continue Coreg 6.25 mg twice daily  Continue Eliquis 2.5 mg twice daily      Coronary atherosclerosis of native coronary artery  Denies chest pain  Continue Eliquis 2.5 mg twice daily      History of TIA (transient ischemic attack)      OSA (obstructive sleep apnea)  Continue CPAP at night      History of B-cell lymphoma      DNR (do not resuscitate)        Anticipated Discharge Arrangements:   Home Health    PT/OT evals ordered?  Therapy evals ordered  Diet:  ADULT ORAL NUTRITION SUPPLEMENT; Breakfast, Lunch, Dinner; Educational psychologist Protein Oral  Supplement  ADULT DIET; Dysphagia - Minced and Moist; 1500 ml; No Drinking Straws  VTE prophylaxis: Already on anticoagulation  Code status: DNR      Non-peripheral Lines and Tubes (if present):          Telemetry (if present):  Cardiac/Telemetry Monitor On: No        Hospital Problems:  Principal Problem:    Severe sepsis (HCC)  Active Problems:    Bilateral pneumonia    Acute respiratory failure with hypoxia    Aspiration pneumonia (HCC)    HTN (hypertension)    Stage 3 chronic kidney disease (HCC)    COVID    Chronic combined systolic and diastolic heart failure (HCC)    Atrial fibrillation (HCC)    HLD (hyperlipidemia)    Coronary atherosclerosis of native coronary artery    History of TIA (transient ischemic attack)    OSA (obstructive sleep apnea)    History of B-cell lymphoma    DNR (do not resuscitate)  Resolved  Problems:    * No resolved hospital problems. *      Objective:   Patient Vitals for the past 24 hrs:   Temp Pulse Resp BP SpO2   07/10/23 0753 97.9 F (36.6 C) 96 18 105/64 93 %   07/10/23 0750 -- 84 18 -- 96 %   07/09/23 1956 98.2 F (36.8 C) 90 16 104/60 93 %   07/09/23 1739 -- 94 18 -- 95 %   07/09/23 1610 -- 94 -- 106/66 --       Oxygen Therapy  SpO2: 93 %  Pulse via Oximetry: 83 beats per minute  Pulse Oximeter Device Mode: Intermittent  Pulse Oximeter Device Location: Finger  O2 Device: Nasal cannula  Skin Assessment: Clean, dry, & intact  FiO2 : 32 %  O2 Flow Rate (L/min): 3 L/min    Estimated body mass index is 29.05 kg/m as calculated from the following:    Height as of this encounter: 1.676 m (5\' 6" ).    Weight as of this encounter: 81.6 kg (180 lb).    Intake/Output Summary (Last 24 hours) at 07/10/2023 0852  Last data filed at 07/10/2023 1610  Gross per 24 hour   Intake --   Output 675 ml   Net -675 ml         Physical Exam:   General:    Appears worn out and frail  Head:  Normocephalic, atraumatic  Eyes:  Sclerae appear normal.  Pupils equally round.  ENT:  Nares appear normal.  Moist oral mucosa  Neck:  No restricted ROM.  Trachea midline   CV:   RRR.  No m/r/g.  No jugular venous distension.  Lungs:   Bilateral scattered rhonchi with coarse wheezing.  Symmetric expansion.  Abdomen:   Soft, nontender, nondistended.  Extremities: No cyanosis or clubbing.  No edema  Skin:     No rashes.  Normal coloration.   Warm and dry.    Neuro:  CN II-XII grossly intact.    Psych:  Depressed affect    Imaging:    Chest X-Ray 2/11 personally reviewed and interpreted by me        I have personally reviewed labs and tests:  Recent Labs:  Recent Results (from the past 48 hour(s))   CBC with Auto Differential    Collection Time: 07/09/23  5:25 AM   Result Value Ref Range    WBC 14.4 (H) 4.3 - 11.1 K/uL    RBC 3.43 (  L) 4.23 - 5.6 M/uL    Hemoglobin 9.9 (L) 13.6 - 17.2 g/dL    Hematocrit 16.1 (L) 41.1 - 50.3 %    MCV 86.3  82.0 - 102.0 FL    MCH 28.9 26.1 - 32.9 PG    MCHC 33.4 31.4 - 35.0 g/dL    RDW 09.6 (H) 04.5 - 14.6 %    Platelets 140 (L) 150 - 450 K/uL    MPV Unable to calculate. Recommend adding IPF. 9.4 - 12.3 FL    nRBC 0.00 0.0 - 0.2 K/uL    Neutrophils % 77.1 43.0 - 78.0 %    Lymphocytes % 4.4 (L) 13.0 - 44.0 %    Monocytes % 13.0 (H) 4.0 - 12.0 %    Eosinophils % 0.0 (L) 0.5 - 7.8 %    Basophils % 0.3 0.0 - 2.0 %    Immature Granulocytes % 5.2 (H) 0.0 - 5.0 %    Neutrophils Absolute 11.11 (H) 1.70 - 8.20 K/UL    Lymphocytes Absolute 0.63 0.50 - 4.60 K/UL    Monocytes Absolute 1.87 (H) 0.10 - 1.30 K/UL    Eosinophils Absolute 0.00 0.00 - 0.80 K/UL    Basophils Absolute 0.04 0.00 - 0.20 K/UL    Immature Granulocytes Absolute 0.75 (H) 0.0 - 0.5 K/UL    RBC Comment SLIGHT  OVALOCYTES        RBC Comment SLIGHT  POIKILOCYTOSIS        RBC Comment SLIGHT  ANISOCYTOSIS        RBC Comment SLIGHT  SCHISTOCYTES        WBC Comment SLIGHT      Platelet Comment ADEQUATE      Differential Type MANUAL     Basic Metabolic Panel w/ Reflex to MG    Collection Time: 07/09/23  5:25 AM   Result Value Ref Range    Sodium 140 136 - 145 mmol/L    Potassium 4.1 3.5 - 5.1 mmol/L    Chloride 103 98 - 107 mmol/L    CO2 22 20 - 29 mmol/L    Anion Gap 15 7 - 16 mmol/L    Glucose 125 (H) 70 - 99 mg/dL    BUN 57 (H) 8 - 23 MG/DL    Creatinine 4.09 (H) 0.80 - 1.30 MG/DL    Est, Glom Filt Rate 34 (L) >60 ml/min/1.12m2    Calcium 9.0 8.8 - 10.2 MG/DL   C-Reactive Protein    Collection Time: 07/09/23 11:41 AM   Result Value Ref Range    CRP 6.8 (H) 0.0 - 0.4 mg/dL   Magnesium    Collection Time: 07/09/23 11:41 AM   Result Value Ref Range    Magnesium 2.1 1.8 - 2.4 mg/dL   Vitamin D 25 Hydroxy    Collection Time: 07/09/23 11:41 AM   Result Value Ref Range    Vit D, 25-Hydroxy 65.1 30.0 - 100.0 ng/mL   CBC    Collection Time: 07/10/23  6:09 AM   Result Value Ref Range    WBC 16.0 (H) 4.3 - 11.1 K/uL    RBC 3.16 (L) 4.23 - 5.6 M/uL    Hemoglobin 9.3 (L) 13.6 -  17.2 g/dL    Hematocrit 81.1 (L) 41.1 - 50.3 %    MCV 84.5 82.0 - 102.0 FL    MCH 29.4 26.1 - 32.9 PG    MCHC 34.8 31.4 - 35.0 g/dL    RDW 91.4 (H) 78.2 - 14.6 %  Platelets 151 150 - 450 K/uL    MPV 13.0 (H) 9.4 - 12.3 FL    nRBC 0.00 0.0 - 0.2 K/uL   Basic Metabolic Panel w/ Reflex to MG    Collection Time: 07/10/23  6:09 AM   Result Value Ref Range    Sodium 141 136 - 145 mmol/L    Potassium 3.9 3.5 - 5.1 mmol/L    Chloride 106 98 - 107 mmol/L    CO2 21 20 - 29 mmol/L    Anion Gap 14 7 - 16 mmol/L    Glucose 106 (H) 70 - 99 mg/dL    BUN 49 (H) 8 - 23 MG/DL    Creatinine 0.96 (H) 0.80 - 1.30 MG/DL    Est, Glom Filt Rate 39 (L) >60 ml/min/1.61m2    Calcium 8.4 (L) 8.8 - 10.2 MG/DL       No results for input(s): "COVID19" in the last 72 hours.    Current Meds:  Current Facility-Administered Medications   Medication Dose Route Frequency    amoxicillin-clavulanate (AUGMENTIN) 500-125 MG per tablet 1 tablet  1 tablet Oral 2 times per day    azithromycin (ZITHROMAX) tablet 250 mg  250 mg Oral Daily    ipratropium 0.5 mg-albuterol 2.5 mg (DUONEB) nebulizer solution 1 Dose  1 Dose Inhalation BID RT    nystatin (MYCOSTATIN) 100000 UNIT/ML suspension 500,000 Units  5 mL Oral 4x Daily    apixaban (ELIQUIS) tablet 2.5 mg  2.5 mg Oral 2 times per day    rosuvastatin (CRESTOR) tablet 10 mg  10 mg Oral Nightly    sodium chloride (Inhalant) 3 % nebulizer solution 4 mL  4 mL Nebulization PRN    arformoterol tartrate (BROVANA) nebulizer solution 15 mcg  15 mcg Nebulization BID RT    sodium chloride (Inhalant) 3 % nebulizer solution 4 mL  4 mL Nebulization BID RT    prednisoLONE acetate (PRED FORTE) 1 % ophthalmic suspension 1 drop  1 drop Left Eye 6 times per day    atropine 1 % ophthalmic solution 1 drop  1 drop Left Eye Nightly    brimonidine (ALPHAGAN) 0.2 % ophthalmic solution 1 drop  1 drop Left Eye TID    sodium chloride flush 0.9 % injection 5-40 mL  5-40 mL IntraVENous 2 times per day    sodium chloride flush 0.9 %  injection 5-40 mL  5-40 mL IntraVENous PRN    0.9 % sodium chloride infusion   IntraVENous PRN    potassium chloride (KLOR-CON M) extended release tablet 40 mEq  40 mEq Oral PRN    Or    potassium bicarb-citric acid (EFFER-K) effervescent tablet 40 mEq  40 mEq Oral PRN    Or    potassium chloride 10 mEq/100 mL IVPB (Peripheral Line)  10 mEq IntraVENous PRN    magnesium sulfate 2000 mg in 50 mL IVPB premix  2,000 mg IntraVENous PRN    ondansetron (ZOFRAN-ODT) disintegrating tablet 4 mg  4 mg Oral Q8H PRN    Or    ondansetron (ZOFRAN) injection 4 mg  4 mg IntraVENous Q6H PRN    polyethylene glycol (GLYCOLAX) packet 17 g  17 g Oral Daily PRN    acetaminophen (TYLENOL) tablet 650 mg  650 mg Oral Q6H PRN    Or    acetaminophen (TYLENOL) suppository 650 mg  650 mg Rectal Q6H PRN    guaiFENesin (MUCINEX) extended release tablet 600 mg  600 mg Oral BID    carvedilol (  COREG) tablet 6.25 mg  6.25 mg Oral BID WC    sodium bicarbonate tablet 650 mg  650 mg Oral 4x Daily    doxazosin (CARDURA) tablet 4 mg  4 mg Oral Daily    pantoprazole (PROTONIX) tablet 40 mg  40 mg Oral QAM AC    furosemide (LASIX) tablet 20 mg  20 mg Oral Daily       Signed:  Cammy Copa, DO    Part of this note may have been written by using a voice dictation software.  The note has been proof read but may still contain some grammatical/other typographical errors.

## 2023-07-10 NOTE — Progress Notes (Addendum)
ACUTE PHYSICAL THERAPY GOALS:   (Developed with and agreed upon by patient and/or caregiver.)      LTG:  (1.)Paul Medina will move from supine to sit and sit to supine  in bed with INDEPENDENT within 7 treatment day(s).    (2.)Paul Medina will transfer from bed to chair and chair to bed with SUPERVISION using the least restrictive device within 7 treatment day(s).    (3.)Paul Medina will ambulate with SUPERVISION for 200 feet with the least restrictive device within 7 treatment day(s).  ________________________________________________________________________________________________      PHYSICAL THERAPY Daily Note and AM  (Link to Caseload Tracking: PT Visit Days : 4  Acknowledge Orders  Time In/Out  PT Charge Capture  Rehab Caseload Tracker    Paul Medina is a 89 y.o. male   PRIMARY DIAGNOSIS: Severe sepsis (HCC)  Severe sepsis (HCC) [A41.9, R65.20]  Pneumonia of right lung due to infectious organism, unspecified part of lung [J18.9]  Sepsis, due to unspecified organism, unspecified whether acute organ dysfunction present (HCC) [A41.9]       Reason for Referral: Generalized Muscle Weakness (M62.81)  Difficulty in walking, Not elsewhere classified (R26.2)  Inpatient: Payor: [employer] / Plan: [employer] / Product Type: *No Product type* /     ASSESSMENT:     REHAB RECOMMENDATIONS:   Recommendation to date pending progress:  Setting:  Short-term Rehab    Equipment:    None     ASSESSMENT:  Paul Medina admitted with above diagnoses.  He presented to the ER with complaints of cough and shortness of breath.  CT showed worsening R-sided pneumonia and worsening mucus plugging.  Patient was hospitalized at [facility] 2/5-2/10 with a-fib and pneumonia.  Short term rehab was recommended during last admission but patient declined and d/c'd home with HH orders.  Patient reports he was told he would have to be in a facility for 2 weeks for rehab and he did not want that.  Explained to patient that there was no  way for [place] to know how long he would need to stay for rehab but he reports he still plans to return home with [facility].  Patient currently demonstrates decreased strength, balance, mobility, and tolerance for activity.  He would benefit from therapy to address these deficits.  He is on 3L O2 at this time but did not qualify for O2 2 days ago when he was d/c'd from [facility].  Patient educated on need for mobility/time out of bed to improve strength, mobility, and ability to safely return home.       2/14 : Today pt needed much encouragement to participate with therapy. So  prolonged sitting with wt shifting & standing with wt shifting was targeted to build endurance & core stability since pt stated that he was too weak to walk. This pt needs to be out of be for all meals &  should more initiative to move his limbs more on his own along with keeping his trunk more elevated for better respiration & to clear his lungs. PT will follow & work toward goals noted.    07/10/23: Patient was supine upon contact on 3L O2. MD in with patient and encouraged him to participate with therapy. Patient was agreeable. Worked on rolling and bed mobility for hygiene and brief change. Transferred from supine to sit with min A. SpO2 dropped to 85%. Worked on breathing techniques and SpO2 recovered to low 90s. Performed sit to stand at [place] with min A. He was  able to take some steps over to the recliner today with RW and min A. Discussed D/C planning with patient. He recognizes that he is very weak and is now agreeable to STR. Will notify case management. Hope to progress gait next visit.      [employer] AM-PACT "6 Clicks" Basic Mobility Inpatient Short Form  AM-PAC Basic Mobility - Inpatient   How much help is needed turning from your back to your side while in a flat bed without using bedrails?: A Little  How much help is needed moving from lying on your back to sitting on the side of a flat bed without using bedrails?: A Little  How much help  is needed moving to and from a bed to a chair?: A Little  How much help is needed standing up from a chair using your arms?: A Little  How much help is needed walking in hospital room?: A Little  How much help is needed climbing 3-5 steps with a railing?: A Little  AM-PAC Inpatient Mobility Raw Score : 18  AM-PAC Inpatient T-Scale Score : 43.63  Mobility Inpatient CMS 0-100% Score: 46.58  Mobility Inpatient CMS G-Code Modifier : CK    SUBJECTIVE:   Paul Medina states, " I am very tired but I will try.  "    Social/Functional Lives With: Alone  Type of Home: Senior housing apartment  Home Layout: One level  Home Access: Level entry, Occupational psychologist Shower/Tub: Pension scheme manager: Midwife: Grab bars in shower, Grab bars around toilet  Bathroom Accessibility: Accessible  Home Equipment: Rollator  Receives Help From: Family  Prior Level of Assist for ADLs: Independent  Prior Level of Assist for [employer]: Independent  Prior Level of Assist for Transfers: Independent  Occupation: Retired    OBJECTIVE:     PAIN: VITALS / O2: PRECAUTION / LINES / DRAINS:   Pre Treatment:   Pain Assessment: None - Denies Pain      Post Treatment: 0/10 Vitals       Oxygen  O2 Device: Nasal cannula  O2 Flow Rate (L/min): 3 L/min   None    RESTRICTIONS/PRECAUTIONS:  Restrictions/Precautions: Isolation, Fall Risk, Bed Alarm                 GROSS EVALUATION:  Intact Impaired (Comments):   AROM []   Generally decreased,  but functional    PROM []     Strength []   Generally decreased, but functional    Balance []      Posture []  Forward Head  Rounded Shoulders  Thoracic Kyphosis   Sensation [x]   grossly   Coordination []    Generally decreased, but functional   Tone []   Hypotonus throughout   Edema []     Activity Tolerance []   poor    []       COGNITION/  PERCEPTION: Intact Impaired (Comments):   Orientation [x]      Vision [x]      Hearing []   B hearing aids   Cognition  [x]        MOBILITY: I Mod I S SBA CGA Min  Mod Max Total  NT x2 Comments:   Bed Mobility    Rolling []  []  []  []  [x]  []  []  []  []  []  []     Supine to Sit []  []  []  []  []  [x]  []  []  []  []  []     Scooting []  []  []  []  []  []  []  []  []  []  []     Sit to Supine []  []  []  []  []  []  []  []  []  [  x] []     Transfers    Sit to Stand []  []  []  []  []  [x]  []  []  []  []  []  With walker   Bed to Chair []  []  []  []  []  [x]  []  []  []  []  []     Stand to Sit []  []  []  []  [x]  []  []  []  []  []  []      []  []  []  []  []  []  []  []  []  []  []     I=Independent, Mod I=Modified Independent, S=Supervision, SBA=Standby Assistance, CGA=Contact Guard Assistance,   Min=Minimal Assistance, Mod=Moderate Assistance, Max=Maximal Assistance, Total=Total Assistance, NT=Not Tested    GAIT: I Mod I S SBA CGA Min Mod Max Total  NT x2 Comments:   Level of Assistance []  []  []  []  []  [x]  []  []  []  []  []     Distance 3 N/A    DME Rolling Walker    Gait Quality Decreased cadence , Decreased step clearance, and Decreased step length    Weightbearing Status     Stairs      I=Independent, Mod I=Modified Independent, S=Supervision, SBA=Standby Assistance, CGA=Contact Guard Assistance,   Min=Minimal Assistance, Mod=Moderate Assistance, Max=Maximal Assistance, Total=Total Assistance, NT=Not Tested    PLAN:   FREQUENCY AND DURATION: 5 times/week, Daily for duration of hospital stay or until stated goals are met, whichever comes first.    THERAPY PROGNOSIS: Good    PROBLEM LIST:   (Skilled intervention is medically necessary to address:)  Decreased ADL/Functional Activities  Decreased Activity Tolerance  Decreased AROM/PROM  Decreased Balance  Decreased Gait Ability  Decreased Strength  Decreased Transfer Abilities INTERVENTIONS PLANNED:   (Benefits and precautions of physical therapy have been discussed with the patient.)  Therapeutic Activity  Therapeutic Exercise/HEP  Gait Training  Education       TREATMENT:       TREATMENT:   Therapeutic Activity (25 Minutes): Therapeutic activity included Supine to Sit, Scooting, Transfer Training,  Ambulation on level ground, Sitting balance , and Standing balance to improve functional Activity tolerance, Balance, Coordination, Mobility, Strength, and ROM.    TREATMENT GRID:  N/A    AFTER TREATMENT PRECAUTIONS: Alarm Activated, Bed/Chair Locked, Call light within reach, Chair, Needs within reach, and RN notified    INTERDISCIPLINARY COLLABORATION:  RN/ PCT and OT/ COTA    EDUCATION:    Educated patient and/or family/caregiver on the following: Assistive device indications/utilization/safety, Fall prevention strategies, and Home Safety    TIME IN/OUT:  Time In: 0900  Time Out: 0930  Minutes: 30    Neyda Durango K Katiana Ruland, PT

## 2023-07-10 NOTE — Plan of Care (Signed)
Problem: Skin/Tissue Integrity  Goal: Skin integrity remains intact  Description: 1.  Monitor for areas of redness and/or skin breakdown  2.  Assess vascular access sites hourly  3.  Every 4-6 hours minimum:  Change oxygen saturation probe site  4.  Every 4-6 hours:  If on nasal continuous positive airway pressure, respiratory therapy assess nares and determine need for appliance change or resting period  Outcome: Progressing     Problem: Respiratory - Adult  Goal: Achieves optimal ventilation and oxygenation  Outcome: Progressing     Problem: ABCDS Injury Assessment  Goal: Absence of physical injury  Outcome: Progressing     Problem: Safety - Adult  Goal: Free from fall injury  Outcome: Progressing     Problem: Discharge Planning  Goal: Discharge to home or other facility with appropriate resources  Outcome: Progressing

## 2023-07-10 NOTE — Progress Notes (Signed)
Spiritual Health History and Assessment/Progress Note  [facility]    Loneliness/Social Isolation,  ,  ,      Name: Paul Medina MRN: [id number]    Age: 89 y.o.     Sex: male   Language: English   Religion: Catholic   Severe sepsis (HCC)     Date: 07/10/2023            Total Time Calculated: 10 min              Spiritual Assessment began in [facility] 4M            Encounter Overview/Reason: Loneliness/Social Isolation  Service Provided For: Patient    Faith, Belief, Meaning:   Patient is connected with a faith tradition or spiritual practice  Family/Friends No family/friends present      Importance and Influence:  Patient has spiritual/personal beliefs that influence decisions regarding their health  Family/Friends No family/friends present    Community:  Patient is connected with a spiritual community  Family/Friends No family/friends present    Assessment and Plan of Care:     Patient Interventions include: Explored spiritual coping/struggle/distress  Family/Friends Interventions include: No family/friends present    Patient Plan of Care: No spiritual needs identified for follow-up  Family/Friends Plan of Care: No spiritual needs identified for follow-up    Electronically signed by Milana Kidney, BCC on 07/10/2023 at 11:47 AM

## 2023-07-10 NOTE — Progress Notes (Signed)
Nutrition Assessment  Assessment Type: Reassess  Reason for visit:  Best Practice Alert: Malnutrition Screening Tool   Malnutrition Screening Tool Score: 2    Nutrition Intervention:   Food and/or Nutrient Delivery:   Meals and Snacks:  Diet: Continue current diet per SLP evaluation  Medical Food Supplements:   Medical food supplement therapy:  Initiate Ensure Plus High Protein (high calorie high protein) 350 calories, 20 grams protein per 8 ounce serving . Specify strawberry or chocolate.  Informed staff of need for assistance with opening Ensure. Order entered to encourage intake of ONS.     Malnutrition Assessment:  Academy/A.S.P.E.N Clinical Malnutrition Criteria  Malnutrition Status: At risk for malnutrition (Decreased intake d/t dyspnea, recent admission with use of ONS)    Nutrition Focused Physical Exam: Unremarkable  (2/6 RD encounter)    Nutrition Assessment:  Food/Nutrition Related History: Per prior RD encounter 2/6: Pt provides the following nutrition hx: Notes that for the past 10 days, his appetite has been limited. Eating a small breakfast of oatmeal or cereal in the morning. Drinking fluids throughout the day. Occasionally taking soups in the afternoon. Did not take ONS at home. Does endorse needing soft foods at baseline due to esophageal dysphagia. Notes his esophagus is "bent". Per chart review; patient has received several dilations at the [place].  Last known EGD: 06/28/19- Spastic esophagus; Presbyesophagus; Nodular erythema at gastric cardia  2/13: Intake average was 50-75% for 3 recorded meals for last 4 days of prior admission. Pt c/o being too "winded" to talk at length.       Weight History: Limited OV weights available in EMR: 10/24/22: 170# (urology), 02/28/23: 179# (Onc), 05/11/23:184# (pulm)  CBW: 181# (06/29/23 bed scale)  2/6: Overall slight tend up in weight over the past year. Pt reports this is because he has is now at a senior living facility and the meals are included. Notes that he now  eats much more at baseline than he used to and has gained weight.   Nutrition Background:       PMH: asthma, HTN, OSA, TIA, GERD, blind in L eye, CKD3, TIA, B-cell lymphoma, CAD, CHF.  Recent admission 2/5 to 2/10 with COVID PNA, new Afib  Presented back with cough and persistent dyspnea, not eating or drinking  Admitted with sepsis and PNA. Developed oral thrush  Nutrition Monitoring/Evaluation:  SLP eval: 2/7 -MM5, OK to upgrade to SB upon pt request, 2/12-regular, 2/13-MBS, MM5 with thin liquids, no straw  Pt seen sitting in chair. Pt says he is eating <50% and often <25% of meals because he isn't hungry. Encouraged Ensure intake. He says he needs help opening the bottles but does like it and drinks about 1 bottle a day.  Current Nutrition Therapies:  ADULT ORAL NUTRITION SUPPLEMENT; Breakfast, Lunch, Dinner; Educational psychologist Protein Oral Supplement  ADULT DIET; Dysphagia - Minced and Moist; 1500 ml; No Drinking Straws    Current Intake:   Average Meal Intake: 26-50% (For 4 recorded meals in past 4 days) Average Supplements Intake: Unable to assess (2 unopended bottles in room. Pt staes he drinks about 1 bottle a day.)      Anthropometric Measures:  Height: 167.6 cm (5\' 6" )  Current Body Wt: 81.6 kg (179 lb 14.3 oz) (2/11), Weight source: Stated  BMI: 29, Overweight (BMI 25.0-29.9)  Admission Body Weight: 81.6 kg (179 lb 14.3 oz) (stated)  Ideal Body Weight (Kg) (Calculated): 65 kg (142 lbs),    BMI Category Overweight (BMI 25.0-29.9)  Comparative Standards:  Energy (kcal/day): 1610-9604 (18-22 kcal/kg) (Kcal/kg Weight used: 81.6 kg Admission  Protein (g/day): 65-82 (0.8-1 g/kg) Weight Used: (Admission) 81.6 kg  Fluid (ml/day):   (1 ml/kcal)    Nutrition Diagnosis:   Inadequate oral intake related to decreased appetite as evidenced by patient reported barriers (intake as above)    Nutrition Goal(s):   Previous Goal Met: Progressing toward Goal(s)  Active Goal: PO intake 50% or greater, by next RD  assessment  Type of Goal: Continue current goal    Discharge Planning:    Continue Oral Nutrition Supplement    Filipe Greathouse, RD

## 2023-07-10 NOTE — Care Coordination-Inpatient (Signed)
Pt chart reviewed and discussed in AM IDR for continued stay. LOS 6 days. Pt admitted with severe sepsis and pneumonia. Per MD, pt willing to work with PT/OT; cough and SOB improved; 3L/Min 02 NC. Pt not medically stable for discharge.     PT/OT recommending STR. Pt with [facility] insurance coverage.     CM to provide pt with [facility] STR approved facilities.     DC/POC STR via [facility] when medically stable.    CM team will continue to follow for potential discharge needs that may arise.     Gardiner Fanti, MSW, LBSW  Social Work Case Software engineer. [place]

## 2023-07-10 NOTE — Progress Notes (Signed)
GOALS:  LTG: Patient will maintain adequate hydration/nutrition with optimum safety and efficiency of swallowing function with PO intake without overt signs or symptoms of aspiration for the highest appropriate diet level.  STG:   Patient will consume minced and moist textures and thin liquids (no straws) without overt signs or symptoms of airway compromise. Ongoing 07/10/2023  Patient will safely ingest diet trials during therapeutic feedings with SLP without overt signs or symptoms of respiratory compromise in efforts to advance diet. Ongoing 07/10/2023  Patient will complete a Modified Barium Swallow study to fully assess physiology and anatomy of the swallow and determine safest appropriate diet and/or rehabilitation strategies, as medically indicated. COMPLETED 07/06/23    SPEECH LANGUAGE PATHOLOGY: Dysphagia Daily Note #1    Acknowledge Order  I  Therapy Time  I   Charges     I  Rehab Caseload Tracker  NAME: Paul Medina  DOB: 05/19/1931  MRN: [id number]    ADMISSION DATE: 07/04/2023  PRIMARY DIAGNOSIS: Severe sepsis (HCC)    ICD-10: Treatment Diagnosis: R13.12 Dysphagia, Oropharyngeal Phase    RECOMMENDATIONS   Diet:    Minced and Moist  Thin Liquids- NO STRAWS    Medication: whole floated in puree or applesauce and crushed in puree or applesauce, as permitted by pharmacy   Compensatory Swallowing Strategies:   Upright for all PO  Set-up assistance  Swallow with intent  Slow rate of PO intake  Small bites and sips  Alternate liquids/solids  No straws  Liquid wash  Remain upright for 20-30 min after any PO   Therapeutic Intervention:   Patient/family education  Instrumental swallow assessment  Dysphagia treatment   Patient continues to require skilled intervention:  Yes. Recommend ongoing speech therapy services during this hospitalization.     Anticipated Discharge Needs: Ongoing speech therapy is recommended at next level of care.      ASSESSMENT    Dysphagia: Coughing noted with and without PO intake during  today's session. Patient agreeable to consume thin liquids, Ensure supplement, and purees only. While coughing observed, patient is being treated for Covid-19 and when questioned of status patient denies difficulty with PO intake. Recommend to continue minced and moist foods with thin liquids - no straws. Patient agreeable to continue current diet and speech therapy will follow for dysphagia during acute phase of care.   GENERAL    Subjective: Received in holding bay, agreeable to skilled assessment. Patient reporting he feels poorly today, but unable to specifically state pain.     Reason for Consult: concern for aspiration.     History of Present Injury/Illness: Paul Medina  has a past medical history of Arthritis, Asthma, Chronic bronchitis (HCC), Hypertension, and Lung cancer (HCC).. He also  has no past surgical history on file.  Precautions/Allergies: Ciprofloxacin, Doxycycline, Lisinopril, Other, Sulfa antibiotics, Sulfasalazine, and Sulfur     Observations:  Alertness: Alert  Drowsy  Voice: WFL  Speech: Intelligible  Expressive Language: Fluent and Able to communicate wants and needs  Receptive Language: Answers yes/no questions, Follows basic commands, and Appropriately responds to questions  Cognition: Appropriately attends to clinician    Prior Dysphagia History: Seen last week with recommendations for minced and moist solids, thin liquids due to patient report of esophageal dysphagia.   Prior Instrumental Assessment: none    Current Diet:  minced and moist  thin liquids     Respiratory Status: Nasal Cannula    Pain:  Patient does not c/o pain  Pain intervention: Repositioned  Pain response:  Patient satisfied    OBJECTIVE    Dysphagia: Patient agreeable to PO trials consuming ~2 oz of thin liquids via cup, ~2 oz of Ensure supplement via cup, and x's 4 presentations of purees. All additional PO trials deferred by the patient. Oral phase did require increased time for prep/transfer of puree  presentations. Pharyngeal phase of swallow was unremarkable. Coughing was observed during PO intake though cough not exclusive to PO trials - patient being treated for respiratory illness as well. Patient denies difficulty with PO intake and agreeable to continue current diet. Speech therapy will follow for dysphagia during acute phase of care.     Dysphagia Outcome and Severity Scale (DOSS)  Score: 4 Description   []  7 Normal in all situations   []  6 Within Functional Limits/ Modified independent   []  5 Mild Dysphagia: Distant Supervision. May need one diet consistency      restricted.   [x]  4 Mild-Moderate Dysphagia: Intermittent supervision/cuing. One-two diet    consistencies restricted.   []  3 Moderate Dysphagia: Total assistance, supervision, or strategies.       Two or more diet consistencies restricted.   []  2 Moderate-Severe Dysphagia: Maximum assistance or maximum use     of strategies with partial po intake   []  1 Severe dysphagia- NPO. Unable to tolerate any po safely       PLAN    Duration/Frequency: Continue to follow patient 3x/week for duration of hospitalization and/or until goals met    Rehabilitation Potential For Stated Goals: Good    Interdisciplinary Collaboration: MD/ PA/ NP  and RN/ PCT    Medical Necessity    Skilled intervention continues to be required due to patient still consuming a modified diet and medical complications.    Education:   Patient and/or family/caregiver educated on Results of evaluation, Role of speech therapy, Diet recommendations, SLP recommendations, and SLP plan  Education response: Verbalizes understanding and Needs reinforcement    Safety:   Call light within reach  In Bed  RN notified   Family/visitors at bedside    Therapy Time:  Time In: 1430  Time Out: 1446  Minutes: 16    Junie Engram, M.S., CCC-SLP  07/10/2023 2:48 PM

## 2023-07-11 LAB — CBC
Hematocrit: 27.4 % — ABNORMAL LOW (ref 41.1–50.3)
Hemoglobin: 9.2 g/dL — ABNORMAL LOW (ref 13.6–17.2)
MCH: 29.3 pg (ref 26.1–32.9)
MCHC: 33.6 g/dL (ref 31.4–35.0)
MCV: 87.3 fL (ref 82.0–102.0)
MPV: UNDETERMINED fL (ref 9.4–12.3)
Platelets: 115 10*3/uL — ABNORMAL LOW (ref 150–450)
RBC: 3.14 M/uL — ABNORMAL LOW (ref 4.23–5.6)
RDW: 15.3 % — ABNORMAL HIGH (ref 11.9–14.6)
WBC: 15.6 10*3/uL — ABNORMAL HIGH (ref 4.3–11.1)
nRBC: 0 10*3/uL (ref 0.0–0.2)

## 2023-07-11 LAB — BASIC METABOLIC PANEL W/ REFLEX TO MG FOR LOW K
Anion Gap: 16 mmol/L (ref 7–16)
BUN: 59 mg/dL — ABNORMAL HIGH (ref 8–23)
CO2: 22 mmol/L (ref 20–29)
Calcium: 8.7 mg/dL — ABNORMAL LOW (ref 8.8–10.2)
Chloride: 103 mmol/L (ref 98–107)
Creatinine: 2 mg/dL — ABNORMAL HIGH (ref 0.80–1.30)
Est, Glom Filt Rate: 31 mL/min/{1.73_m2} — ABNORMAL LOW (ref >60)
Glucose: 128 mg/dL — ABNORMAL HIGH (ref 70–99)
Potassium: 3.7 mmol/L (ref 3.5–5.1)
Sodium: 141 mmol/L (ref 136–145)

## 2023-07-11 MED ORDER — SODIUM CHLORIDE 0.9 % IV SOLN
0.9 | INTRAVENOUS | Status: AC
Start: 2023-07-11 — End: 2023-07-12
  Administered 2023-07-11 – 2023-07-12 (×2): via INTRAVENOUS

## 2023-07-11 MED ORDER — DEXAMETHASONE 4 MG PO TABS
4 MG | Freq: Every day | ORAL | Status: DC
Start: 2023-07-11 — End: 2023-07-18
  Administered 2023-07-11 – 2023-07-18 (×8): 6 mg via ORAL

## 2023-07-11 MED FILL — ROSUVASTATIN CALCIUM 5 MG PO TABS: 5 MG | ORAL | Qty: 2

## 2023-07-11 MED FILL — GUAIFENESIN ER 600 MG PO TB12: 600 MG | ORAL | Qty: 1

## 2023-07-11 MED FILL — SODIUM CHLORIDE 3 % IN NEBU: 3 % | RESPIRATORY_TRACT | Qty: 4

## 2023-07-11 MED FILL — IPRATROPIUM-ALBUTEROL 0.5-2.5 (3) MG/3ML IN SOLN: 0.5-2.533 (3) MG/3ML | RESPIRATORY_TRACT | Qty: 3

## 2023-07-11 MED FILL — SODIUM BICARBONATE 650 MG PO TABS: 650 MG | ORAL | Qty: 1

## 2023-07-11 MED FILL — AMOXICILLIN-POT CLAVULANATE 500-125 MG PO TABS: 500-125 MG | ORAL | Qty: 1

## 2023-07-11 MED FILL — ELIQUIS 2.5 MG PO TABS: 2.5 MG | ORAL | Qty: 1

## 2023-07-11 MED FILL — CARVEDILOL 6.25 MG PO TABS: 6.25 MG | ORAL | Qty: 1

## 2023-07-11 MED FILL — FUROSEMIDE 20 MG PO TABS: 20 MG | ORAL | Qty: 1

## 2023-07-11 MED FILL — DOXAZOSIN MESYLATE 4 MG PO TABS: 4 MG | ORAL | Qty: 1

## 2023-07-11 MED FILL — ARFORMOTEROL TARTRATE 15 MCG/2ML IN NEBU: 152 MCG/2ML | RESPIRATORY_TRACT | Qty: 2

## 2023-07-11 MED FILL — PANTOPRAZOLE SODIUM 40 MG PO TBEC: 40 MG | ORAL | Qty: 1

## 2023-07-11 MED FILL — NYSTATIN 100000 UNIT/ML MT SUSP: 100000 UNIT/ML | OROMUCOSAL | Qty: 5

## 2023-07-11 MED FILL — DEXAMETHASONE 4 MG PO TABS: 4 MG | ORAL | Qty: 2

## 2023-07-11 MED FILL — AZITHROMYCIN 250 MG PO TABS: 250 MG | ORAL | Qty: 1

## 2023-07-11 NOTE — Progress Notes (Signed)
ACUTE PHYSICAL THERAPY GOALS:   (Developed with and agreed upon by patient and/or caregiver.)      LTG:  (1.)Paul Medina will move from supine to sit and sit to supine  in bed with INDEPENDENT within 7 treatment day(s).    (2.)Paul Medina will transfer from bed to chair and chair to bed with SUPERVISION using the least restrictive device within 7 treatment day(s).    (3.)Paul Medina will ambulate with SUPERVISION for 200 feet with the least restrictive device within 7 treatment day(s).  ________________________________________________________________________________________________      PHYSICAL THERAPY Daily Note and PM  (Link to Caseload Tracking: PT Visit Days : 5  Acknowledge Orders  Time In/Out  PT Charge Capture  Rehab Caseload Tracker    Paul Medina is a 89 y.o. male   PRIMARY DIAGNOSIS: Severe sepsis (HCC)  Severe sepsis (HCC) [A41.9, R65.20]  Pneumonia of right lung due to infectious organism, unspecified part of lung [J18.9]  Sepsis, due to unspecified organism, unspecified whether acute organ dysfunction present (HCC) [A41.9]       Reason for Referral: Generalized Muscle Weakness (M62.81)  Difficulty in walking, Not elsewhere classified (R26.2)  Inpatient: Payor: [employer] / Plan: [employer] / Product Type: *No Product type* /     ASSESSMENT:     REHAB RECOMMENDATIONS:   Recommendation to date pending progress:  Setting:  Short-term Rehab    Equipment:    None     ASSESSMENT:  Paul Medina admitted with above diagnoses.  He presented to the ER with complaints of cough and shortness of breath.  CT showed worsening R-sided pneumonia and worsening mucus plugging.  Patient was hospitalized at [facility] 2/5-2/10 with a-fib and pneumonia.  Short term rehab was recommended during last admission but patient declined and d/c'd home with HH orders.  Patient reports he was told he would have to be in a facility for 2 weeks for rehab and he did not want that.  Explained to patient that there was no  way for [place] to know how long he would need to stay for rehab but he reports he still plans to return home with [facility].  Patient currently demonstrates decreased strength, balance, mobility, and tolerance for activity.  He would benefit from therapy to address these deficits.  He is on 3L O2 at this time but did not qualify for O2 2 days ago when he was d/c'd from [facility].  Patient educated on need for mobility/time out of bed to improve strength, mobility, and ability to safely return home.       2/18:  Patient has been up in the chair for about 5 hours.  Patient slow with all transitions and struggles to make decisions-whether he wants to stay in the chair, return to bed, walk to the bathroom, etc.  He attempted to urinate in the urinal in standing but then needed to sit.  Only able to urinate minimally but declined attempting to void in the bathroom or try again after mobilizing.  He did finally get up and walk in the room and sat on edge of bed.  He then decided that he was ready to lie back down.  Patient able to manage sit to supine without assist.  Patient doesn't require physical assist to ambulate but his activity tolerance is very limited.  He is not appropriate to d/c home alone and is appropriate to d/c to SHORT TERM REHAB.       [employer] AM-PACT "6 Clicks" Basic Mobility  Inpatient Short Form  AM-PAC Basic Mobility - Inpatient   How much help is needed turning from your back to your side while in a flat bed without using bedrails?: A Little  How much help is needed moving from lying on your back to sitting on the side of a flat bed without using bedrails?: A Little  How much help is needed moving to and from a bed to a chair?: A Little  How much help is needed standing up from a chair using your arms?: A Little  How much help is needed walking in hospital room?: A Little  How much help is needed climbing 3-5 steps with a railing?: A Little  AM-PAC Inpatient Mobility Raw Score : 18  AM-PAC Inpatient  T-Scale Score : 43.63  Mobility Inpatient CMS 0-100% Score: 46.58  Mobility Inpatient CMS G-Code Modifier : CK    SUBJECTIVE:   Paul Medina states, " I don't know".    Social/Functional Lives With: Alone  Type of Home: Senior housing apartment  Home Layout: One level  Home Access: Level entry, Occupational psychologist Shower/Tub: Pension scheme manager: Midwife: Grab bars in shower, Grab bars around toilet  Bathroom Accessibility: Accessible  Home Equipment: Engineer, production Help From: Family  Prior Level of Assist for ADLs: Independent  Prior Level of Assist for [employer]: Independent  Prior Level of Assist for Transfers: Independent  Occupation: Retired    OBJECTIVE:     PAIN: VITALS / O2: PRECAUTION / LINES / DRAINS:   Pre Treatment:  no complaints         Post Treatment: no complaints Vitals       Oxygen  O2 Device: Nasal cannula   IV    RESTRICTIONS/PRECAUTIONS:  Restrictions/Precautions: Isolation, Fall Risk, Bed Alarm                 GROSS EVALUATION:  Intact Impaired (Comments):   AROM []   Generally decreased,  but functional    PROM []     Strength []   Generally decreased, but functional    Balance []  Sitting - Static: Good  Sitting - Dynamic: Fair, +  Standing - Static: Fair, +  Standing - Dynamic: Fair   Posture []  Forward Head  Rounded Shoulders  Thoracic Kyphosis   Sensation [x]   grossly   Coordination []    Generally decreased, but functional   Tone []   Hypotonus throughout   Edema []     Activity Tolerance []   poor    []       COGNITION/  PERCEPTION: Intact Impaired (Comments):   Orientation [x]      Vision [x]      Hearing []   B hearing aids   Cognition  [x]        MOBILITY: I Mod I S SBA CGA Min Mod Max Total  NT x2 Comments:   Bed Mobility    Rolling []  []  []  []  []  []  []  []  []  []  []     Supine to Sit []  []  []  []  []  []  []  []  []  []  []     Scooting []  []  []  []  []  []  []  []  []  []  []     Sit to Supine []  []  []  [x]  []  []  []  []  []  []  []     Transfers    Sit to Stand []  []  []  []  [x]  [x]   []  []  []  []  []  With walker   Bed to Chair []  []  []  []  []  []  []  []  []  []  []   Stand to Sit []  []  []  [x]  []  []  []  []  []  []  []      []  []  []  []  []  []  []  []  []  []  []     I=Independent, Mod I=Modified Independent, S=Supervision, SBA=Standby Assistance, CGA=Contact Guard Assistance,   Min=Minimal Assistance, Mod=Moderate Assistance, Max=Maximal Assistance, Total=Total Assistance, NT=Not Tested    GAIT: I Mod I S SBA CGA Min Mod Max Total  NT x2 Comments:   Level of Assistance []  []  []  [x]  []  []  []  []  []  []  []     Distance 10     DME Rolling Walker    Gait Quality Decreased cadence , Decreased step clearance, and Decreased step length    Weightbearing Status     Stairs      I=Independent, Mod I=Modified Independent, S=Supervision, SBA=Standby Assistance, CGA=Contact Guard Assistance,   Min=Minimal Assistance, Mod=Moderate Assistance, Max=Maximal Assistance, Total=Total Assistance, NT=Not Tested    PLAN:   FREQUENCY AND DURATION: 5 times/week, Daily for duration of hospital stay or until stated goals are met, whichever comes first.    THERAPY PROGNOSIS: Good    PROBLEM LIST:   (Skilled intervention is medically necessary to address:)  Decreased ADL/Functional Activities  Decreased Activity Tolerance  Decreased AROM/PROM  Decreased Balance  Decreased Gait Ability  Decreased Strength  Decreased Transfer Abilities INTERVENTIONS PLANNED:   (Benefits and precautions of physical therapy have been discussed with the patient.)  Therapeutic Activity  Therapeutic Exercise/HEP  Gait Training  Education       TREATMENT:       TREATMENT:   Therapeutic Activity (40 Minutes): Therapeutic activity included Sit to Supine, Scooting, Transfer Training, Ambulation on level ground, Sitting balance , and Standing balance to improve functional Activity tolerance, Balance, Coordination, Mobility, Strength, and ROM.    TREATMENT GRID:  N/A    AFTER TREATMENT PRECAUTIONS: Bed, Bed/Chair Locked, Call light within reach, and Needs within  reach    INTERDISCIPLINARY COLLABORATION:  RN/ PCT    EDUCATION: Education Given To: Patient  Education Provided: Role of Therapy;Plan of Programmer, applications Method: Verbal  Education Outcome: Verbalized understanding  Educated patient and/or family/caregiver on the following: Assistive device indications/utilization/safety, Fall prevention strategies, and Home Safety    TIME IN/OUT:  Time In: 1330  Time Out: 1410  Minutes: 40    Georgeanna Harrison, PT

## 2023-07-11 NOTE — Plan of Care (Signed)
Problem: Discharge Planning  Goal: Discharge to home or other facility with appropriate resources  Outcome: Progressing     Problem: Safety - Adult  Goal: Free from fall injury  Outcome: Progressing     Problem: ABCDS Injury Assessment  Goal: Absence of physical injury  Outcome: Progressing     Problem: Respiratory - Adult  Goal: Achieves optimal ventilation and oxygenation  Outcome: Progressing     Problem: Skin/Tissue Integrity  Goal: Skin integrity remains intact  Description: 1.  Monitor for areas of redness and/or skin breakdown  2.  Assess vascular access sites hourly  3.  Every 4-6 hours minimum:  Change oxygen saturation probe site  4.  Every 4-6 hours:  If on nasal continuous positive airway pressure, respiratory therapy assess nares and determine need for appliance change or resting period  Outcome: Progressing

## 2023-07-11 NOTE — Progress Notes (Signed)
 ACUTE OCCUPATIONAL THERAPY GOALS:   (Developed with and agreed upon by patient and/or caregiver.)  1. Patient will perform grooming with SPV and adaptive equipment as needed. PROGRESSING 07/11/23   2. Patient will perform upper body dressing with SPV and adaptive equipment as needed.  3. Patient will perform lower body dressing with SPV and adaptive equipment as needed.  4. Patient will perform bathing with SPV and adaptive equipment as needed.  5. Patient will perform toileting and toilet transfer with SPV and adaptive equipment as needed.  6. Patient will perform ADL functional mobility and tranfers in room with SPV and adaptive equipment as needed. PROGRESSING 07/11/23   7. Patient/family to demonstrate knowledge of home safety and DME recommendations.    Timeframe: 7 visits     OCCUPATIONAL THERAPY Daily Note and AM       OT Visit Days: 4  Acknowledge Orders  Time  OT Charge Capture  Rehab Caseload Tracker      Paul Medina is a 89 y.o. male   PRIMARY DIAGNOSIS: Severe sepsis (HCC)  Severe sepsis (HCC) [A41.9, R65.20]  Pneumonia of right lung due to infectious organism, unspecified part of lung [J18.9]  Sepsis, due to unspecified organism, unspecified whether acute organ dysfunction present (HCC) [A41.9]       Reason for Referral: Generalized Muscle Weakness (M62.81)  Other lack of cordination (R27.8)  Difficulty in walking, Not elsewhere classified (R26.2)  Dizziness and Giddiness (R42)  Inpatient: Payor: [employer] / Plan: [employer] / Product Type: *No Product type* /     ASSESSMENT:     REHAB RECOMMENDATIONS:   Recommendation to date pending progress:  Setting:  Short-term Rehab  Continued occupational therapy recommended at discharge    Equipment:    None     ASSESSMENT:  Paul Medina is admitted for the above diagnoses and presents with overall deficits in strength, functional mobility and ADL performance.  He lives alone in a senior living apartment community with level entry. At baseline (prior  to recent hospitalizations), he reports independence with ADLs and mobility (sometimes uses rollator). Today, he was able to perform bed mobility, household mobility, and grooming tasks seated in recliner with setup. Patient has been using urinal for toileting--donned new brief at EOB. He remains on 3L NC during session with episodes of wet/productive sounding cough. SpO2 97%. Presents with decreased activity tolerance impacting his ability to safely and independently perform ADLs. He was left in recliner chair with all needs met and in reach. Xray tech at bedside. Pt is functioning below their baseline and will benefit from skilled OT during hospital stay. Anticipate pt will benefit from continued therapy upon discharge.     07/06/2023: Patient upon arrival reclined in bed, on 3L of O2 reports shortness of breath, spO2 95%. Patient completed supine to sit with stand by assist. Patient stood with rolling walker with stand by assist and performed functional mobility with rolling walker stand by assist to sit in bedside chair. Patient needed extended time to complete all tasks today. RT notified. Lengthy discussion with patient about discharge planning, patient is confused, he did not want to have this conversation at this time. Patient will continue to benefit from skilled OT services to maximize independence and safety with ADL tasks.     07/10/23 900am Patient supine in bed on 3 LPM nc. MD present in room to encourage oob activity. Patient agreeable O2 sat 91-93%. He rolled right and left with contact guard assist and was assisted with  rear peri care and brief change. He sat on the eob with min assist and sat was 85% but rebounded quickly with breathing through his nose to low 90's. He drank with mod cues for small sips and chin tuck. He stood and took steps to recliner with min assist with [employer]. He was set up in recliner and was educated on OT poc and recommendations.  He was agreeable to SHORT TERM REHAB  as he is not at his baseline and lives alone. Tab alert in place. HR  varied during the session and nursing advised. Will follow.    07/11/23 830am patient sitting up on eob completing teeth brushing  with nursing student. He  stood and took steps to recliner with contact guard assist with [employer]. He was set up in recliner and eating with set up. Resp therapy started breathing treatment. He appears stronger today and o2 sat staying 92%+. HR around 100 consistently He would continue to benefit from skilled OT services and SHORT TERM REHAB. Will follow.[employer] AM-PACT "6 Clicks" Daily Activity Inpatient Short Form:    AM-PAC Daily Activity - Inpatient   How much help is needed for putting on and taking off regular lower body clothing?: A Lot  How much help is needed for bathing (which includes washing, rinsing, drying)?: A Lot  How much help is needed for toileting (which includes using toilet, bedpan, or urinal)?: A Little  How much help is needed for putting on and taking off regular upper body clothing?: None  How much help is needed for taking care of personal grooming?: None  How much help for eating meals?: None  AM-PAC Inpatient Daily Activity Raw Score: 19  AM-PAC Inpatient ADL T-Scale Score : 40.22  ADL Inpatient CMS 0-100% Score: 42.8  ADL Inpatient CMS G-Code Modifier : CK           SUBJECTIVE:     Paul Medina stated he wanted to eat some mellon    Social/Functional Lives With: Alone  Type of Home: Senior housing apartment  Home Layout: One level  Home Access: Level entry, Occupational psychologist Shower/Tub: Pension scheme manager: Midwife: Grab bars in shower, Grab bars around toilet  Bathroom Accessibility: Accessible  Home Equipment: Rollator  Receives Help From: Family  Prior Level of Assist for ADLs: Independent  Prior Level of Assist for [employer]: Independent  Prior Level of Assist for Transfers: Independent  Occupation: Retired    OBJECTIVE:      LINES / DRAINS / AIRWAY: None    RESTRICTIONS/PRECAUTIONS:  Restrictions/Precautions: Isolation, Fall Risk, Bed Alarm    PAIN: VITALS / O2:   Pre Treatment:       none    Post Treatment: none       Vitals      Har [employer] consistantly    Oxygen   3L NC   92% + during transfer and seated tasks     GROSS EVALUATION: INTACT IMPAIRED   (See Comments)   UE AROM [x]  []    UE PROM [x]  []    Strength []   Generalized weakness     Posture / Balance []   Fair using RW for functional mobility and transfers   Sensation [x]      Coordination [x]        Tone [x]        Edema []     Activity Tolerance []   Decreased from baseline  Hand Dominance R []  L []       COGNITION/  PERCEPTION: INTACT IMPAIRED   (See Comments)   Orientation [x]      Vision [x]      Hearing []   Hearing aides B    Cognition  [x]   Follows simple directions   Perception []   impaired     MOBILITY: I Mod I S SBA CGA Min Mod Max Total  NT x2 Comments:   Bed Mobility    Rolling []  []  []  []  []  []  []  []  []  [x]  []     Supine to Sit []  []  []  []  []  []  []  []  []  [x]  []     Scooting []  []  []  [x]  []  []  []  []  []  [x]  []     Sit to Supine []  []  []  []  []  []  []  []  []  [x]  []     Transfers    Sit to Stand []  []  []  []  [x]  []  []  []  []  []  []     Bed to Chair []  []  []  []  [x]  []  []  []  []  []  []  RW   Stand to Sit []  []  []  []  [x]  []  []  []  []  []  []     Tub/Shower []  []  []  []  []  []  []  []  []  []  []      Toilet []  []  []  []  []  []  []  []  []  []  []       []  []  []  []  []  []  []  []  []  []  []     I=Independent, Mod I=Modified Independent, S=Supervision/Setup, SBA=Standby Assistance, CGA=Contact Guard Assistance, Min=Minimal Assistance, Mod=Moderate Assistance, Max=Maximal Assistance, Total=Total Assistance, NT=Not Tested    ACTIVITIES OF DAILY LIVING: I Mod I S SBA CGA Min Mod Max Total NT Comments   BASIC ADLs:              Upper Body Bathing  []  []  []  []  []  []  []  []  []  [x]      Lower Body Bathing []  []  []  []  []  []  []  []  []  [x]      Toileting []  []  []  []  []  []  []  []  []  [x]     Upper Body Dressing []  []  []  []  []  []  []  []   []  [x]     Lower Body Dressing []  []  []  []  []  []  []  []  []  [x]     Feeding []  []  [x]  []  []  []  []  []  []  []     Grooming []  []  []  [x]  []  []  []  []  []  []     Personal Device Care []  []  []  []  []  []  []  []  []  []     Functional Mobility []  []  []  [x]  [x]  []  []  []  []  []  Mobility in room, additional time needed, RW   I=Independent, Mod I=Modified Independent, S=Supervision/Setup, SBA=Standby Assistance, CGA=Contact Guard Assistance, Min=Minimal Assistance, Mod=Moderate Assistance, Max=Maximal Assistance, Total=Total Assistance, NT=Not Tested    PLAN:   FREQUENCY/DURATION     for duration of hospital stay or until stated goals are met, whichever comes first.    PROBLEM LIST:   (Skilled intervention is medically necessary to address:)  Decreased ADL/Functional Activities  Decreased Activity Tolerance  Decreased Balance  Decreased Gait Ability  Decreased Safety Awareness  Decreased Strength  Decreased Transfer Abilities  Increased Pain   INTERVENTIONS PLANNED:  (Benefits and precautions of occupational therapy have been discussed with the patient.)  Self Care Training  Therapeutic Activity  Therapeutic Exercise/HEP  Neuromuscular Re-education  Manual Therapy  Education         TREATMENT:     TREATMENT:   SELF CARE: (25 minutes):   Procedure(s) (per grid) utilized  to improve and/or restore self-care/home management as related to grooming, self feeding, and functional mobility  . Required mod visual, verbal, manual, and tactile cueing to facilitate activities of daily living skills, compensatory activities, and OT poc, discharge plan,  and feeding safety .      TREATMENT GRID:  N/A    AFTER TREATMENT PRECAUTIONS: Alarm Activated, Bed/Chair Locked, Call light within reach, Chair, Needs within reach, RN notified, and RT present    INTERDISCIPLINARY COLLABORATION:  RN/ PCT, OT/ COTA, and RT    EDUCATION:  Education Given To: Patient  Education Provided: Role of Therapy;Plan of Engineer, materials;Mobility Training;Fall  Prevention Strategies;Equipment  Education Method: Demonstration;Verbal  Barriers to Learning: Hearing  Education Outcome: Verbalized understanding;Demonstrated understanding;Continued education needed    TOTAL TREATMENT DURATION AND TIME:  Time In: 0830  Time Out: 0855  Minutes: [address], OT

## 2023-07-11 NOTE — Progress Notes (Signed)
Hospitalist Progress Note   Admit Date:  07/04/2023 11:46 AM   Name:  Paul Medina   Age:  89 y.o.  Sex:  male  DOB:  1930/11/25   MRN:  [id number]   Room:  357/01    Presenting/Chief Complaint: Shortness of Breath     Reason(s) for Admission: Severe sepsis (HCC) [A41.9, R65.20]  Pneumonia of right lung due to infectious organism, unspecified part of lung [J18.9]  Sepsis, due to unspecified organism, unspecified whether acute organ dysfunction present [facility]) [A41.9]     Hospital Course:   Paul Medina is a 89 y.o. male with chronic combined heart failure, PAF on Eliquis, HTN, CAD, history of TIA, left eye blindness, OSA on CPAP, and CKD stage III admitted on 2/11 due to severe sepsis and acute respiratory failure from bilateral pneumonia consistent with aspiration and hospital-acquired pneumonia.  He also had recently been diagnosed with COVID and treated.  Blood cultures NGTD  UA unremarkable  2/11 CT chest with worsening right sided infiltrates and improving left sided infiltrates  Subjective & 24hr Events:   He was sitting comfortably in the chair.  He is very hard of hearing.  He still feeling weak and fatigued..  Denies chest pain.  Shortness of breath improved.  Cough improved.  Denies fevers.  Denies N/V.    Assessment & Plan:       Severe sepsis (HCC)    Bilateral pneumonia    Aspiration pneumonia (HCC)  Afebrile, leukocytosis of 15.6 K  Currently patient is saturating well on 3 LNC and wean oxygen as tolerated  Continue Augmentin 500 mg twice daily -EOT 2/20  Continue azithromycin 250 mg daily x 5 days-EOT 2/19        Acute respiratory failure with hypoxia  Patient down to 3 L nasal cannula  Continue Brovana 15 mcg twice daily, DuoNeb 4 times daily, Mucinex 600 mg twice daily        HTN (hypertension)  Blood pressure stable  Continue Coreg 6.25 mg twice daily      Stage 3 chronic kidney disease (HCC)  Creatinine slowly rising, creatinine is 2 and baseline is 1.4  Continue to hold Lasix  Started  on IVF  Check BMP daily      COVID  Patient diagnosed on 2/5  2/16 CRP 6.8 => not a candidate for baricitinib  Started on  Decadron       Chronic combined systolic and diastolic heart failure (HCC)  Euvolemic  Continue to hold Lasix      Paroxysmal atrial fibrillation (HCC)  Continue Coreg 6.25 mg twice daily  Continue Eliquis 2.5 mg twice daily      Coronary atherosclerosis of native coronary artery      History of TIA (transient ischemic attack)      OSA (obstructive sleep apnea)  Continue CPAP at night      History of B-cell lymphoma      DNR (do not resuscitate)    Dispo anticipate hospital stay for 1 to 2 days.    Anticipated Discharge Arrangements:   Home Health    PT/OT evals ordered?  Therapy evals ordered  Diet:  ADULT ORAL NUTRITION SUPPLEMENT; Breakfast, Lunch, Dinner; Educational psychologist Protein Oral Supplement  ADULT DIET; Dysphagia - Minced and Moist; 1500 ml; No Drinking Straws  VTE prophylaxis: Already on anticoagulation  Code status: DNR      Non-peripheral Lines and Tubes (if present):          Telemetry (if present):  Cardiac/Telemetry Monitor On: No        Hospital Problems:  Principal Problem:    Severe sepsis (HCC)  Active Problems:    Chronic combined systolic and diastolic heart failure (HCC)    HTN (hypertension)    HLD (hyperlipidemia)    Coronary atherosclerosis of native coronary artery    History of TIA (transient ischemic attack)    OSA (obstructive sleep apnea)    Stage 3 chronic kidney disease (HCC)    COVID    Atrial fibrillation (HCC)    Bilateral pneumonia    Acute respiratory failure with hypoxia    Aspiration pneumonia (HCC)    History of B-cell lymphoma    DNR (do not resuscitate)  Resolved Problems:    * No resolved hospital problems. *      Objective:   Patient Vitals for the past 24 hrs:   Temp Pulse Resp BP SpO2   07/11/23 0854 -- 88 18 -- 94 %   07/11/23 0800 97.9 F (36.6 C) 98 19 92/62 92 %   07/10/23 2217 -- 85 16 -- 97 %   07/10/23 2039 98.2 F (36.8 C) 79 20  107/77 97 %       Oxygen Therapy  SpO2: 94 %  Pulse via Oximetry: 83 beats per minute  Pulse Oximeter Device Mode: Intermittent  Pulse Oximeter Device Location: Left, Finger  O2 Device: Nasal cannula  Skin Assessment: Clean, dry, & intact  FiO2 : 32 %  O2 Flow Rate (L/min): 3.5 L/min    Estimated body mass index is 29.05 kg/m as calculated from the following:    Height as of this encounter: 1.676 m (5\' 6" ).    Weight as of this encounter: 81.6 kg (180 lb).    Intake/Output Summary (Last 24 hours) at 07/11/2023 1522  Last data filed at 07/11/2023 1216  Gross per 24 hour   Intake --   Output 200 ml   Net -200 ml         Physical Exam:   General:    Appears worn out and frail  Head:  Normocephalic, atraumatic  Eyes:  Sclerae appear normal.  Pupils equally round.  ENT:  Nares appear normal.  Moist oral mucosa  Neck:  No restricted ROM.  Trachea midline   CV:   RRR.  No m/r/g.  No jugular venous distension.  Lungs:   Bilateral scattered rhonchi with coarse wheezing.  Symmetric expansion.  Abdomen:   Soft, nontender, nondistended.  Extremities: No cyanosis or clubbing.  No edema  Skin:     No rashes.  Normal coloration.   Warm and dry.    Neuro:  CN II-XII grossly intact.    Psych:  Depressed affect    Imaging:    Chest X-Ray 2/11 personally reviewed and interpreted by me        I have personally reviewed labs and tests:  Recent Labs:  Recent Results (from the past 48 hour(s))   CBC    Collection Time: 07/10/23  6:09 AM   Result Value Ref Range    WBC 16.0 (H) 4.3 - 11.1 K/uL    RBC 3.16 (L) 4.23 - 5.6 M/uL    Hemoglobin 9.3 (L) 13.6 - 17.2 g/dL    Hematocrit 16.1 (L) 41.1 - 50.3 %    MCV 84.5 82.0 - 102.0 FL    MCH 29.4 26.1 - 32.9 PG    MCHC 34.8 31.4 - 35.0 g/dL    RDW 09.6 (H) 04.5 -  14.6 %    Platelets 151 150 - 450 K/uL    MPV 13.0 (H) 9.4 - 12.3 FL    nRBC 0.00 0.0 - 0.2 K/uL   Basic Metabolic Panel w/ Reflex to MG    Collection Time: 07/10/23  6:09 AM   Result Value Ref Range    Sodium 141 136 - 145 mmol/L     Potassium 3.9 3.5 - 5.1 mmol/L    Chloride 106 98 - 107 mmol/L    CO2 21 20 - 29 mmol/L    Anion Gap 14 7 - 16 mmol/L    Glucose 106 (H) 70 - 99 mg/dL    BUN 49 (H) 8 - 23 MG/DL    Creatinine 1.61 (H) 0.80 - 1.30 MG/DL    Est, Glom Filt Rate 39 (L) >60 ml/min/1.26m2    Calcium 8.4 (L) 8.8 - 10.2 MG/DL   CBC    Collection Time: 07/11/23  6:04 AM   Result Value Ref Range    WBC 15.6 (H) 4.3 - 11.1 K/uL    RBC 3.14 (L) 4.23 - 5.6 M/uL    Hemoglobin 9.2 (L) 13.6 - 17.2 g/dL    Hematocrit 09.6 (L) 41.1 - 50.3 %    MCV 87.3 82.0 - 102.0 FL    MCH 29.3 26.1 - 32.9 PG    MCHC 33.6 31.4 - 35.0 g/dL    RDW 04.5 (H) 40.9 - 14.6 %    Platelets 115 (L) 150 - 450 K/uL    MPV Unable to calculate. Recommend adding IPF. 9.4 - 12.3 FL    nRBC 0.00 0.0 - 0.2 K/uL   Basic Metabolic Panel w/ Reflex to MG    Collection Time: 07/11/23  6:04 AM   Result Value Ref Range    Sodium 141 136 - 145 mmol/L    Potassium 3.7 3.5 - 5.1 mmol/L    Chloride 103 98 - 107 mmol/L    CO2 22 20 - 29 mmol/L    Anion Gap 16 7 - 16 mmol/L    Glucose 128 (H) 70 - 99 mg/dL    BUN 59 (H) 8 - 23 MG/DL    Creatinine 8.11 (H) 0.80 - 1.30 MG/DL    Est, Glom Filt Rate 31 (L) >60 ml/min/1.71m2    Calcium 8.7 (L) 8.8 - 10.2 MG/DL       No results for input(s): "COVID19" in the last 72 hours.    Current Meds:  Current Facility-Administered Medications   Medication Dose Route Frequency    0.9 % sodium chloride infusion   IntraVENous Continuous    amoxicillin-clavulanate (AUGMENTIN) 500-125 MG per tablet 1 tablet  1 tablet Oral 2 times per day    azithromycin (ZITHROMAX) tablet 250 mg  250 mg Oral Daily    ipratropium 0.5 mg-albuterol 2.5 mg (DUONEB) nebulizer solution 1 Dose  1 Dose Inhalation BID RT    nystatin (MYCOSTATIN) 100000 UNIT/ML suspension 500,000 Units  5 mL Oral 4x Daily    apixaban (ELIQUIS) tablet 2.5 mg  2.5 mg Oral 2 times per day    rosuvastatin (CRESTOR) tablet 10 mg  10 mg Oral Nightly    sodium chloride (Inhalant) 3 % nebulizer solution 4 mL  4 mL  Nebulization PRN    arformoterol tartrate (BROVANA) nebulizer solution 15 mcg  15 mcg Nebulization BID RT    sodium chloride (Inhalant) 3 % nebulizer solution 4 mL  4 mL Nebulization BID RT    prednisoLONE acetate (PRED FORTE) 1 %  ophthalmic suspension 1 drop  1 drop Left Eye 6 times per day    atropine 1 % ophthalmic solution 1 drop  1 drop Left Eye Nightly    brimonidine (ALPHAGAN) 0.2 % ophthalmic solution 1 drop  1 drop Left Eye TID    sodium chloride flush 0.9 % injection 5-40 mL  5-40 mL IntraVENous 2 times per day    sodium chloride flush 0.9 % injection 5-40 mL  5-40 mL IntraVENous PRN    0.9 % sodium chloride infusion   IntraVENous PRN    potassium chloride (KLOR-CON M) extended release tablet 40 mEq  40 mEq Oral PRN    Or    potassium bicarb-citric acid (EFFER-K) effervescent tablet 40 mEq  40 mEq Oral PRN    Or    potassium chloride 10 mEq/100 mL IVPB (Peripheral Line)  10 mEq IntraVENous PRN    magnesium sulfate 2000 mg in 50 mL IVPB premix  2,000 mg IntraVENous PRN    ondansetron (ZOFRAN-ODT) disintegrating tablet 4 mg  4 mg Oral Q8H PRN    Or    ondansetron (ZOFRAN) injection 4 mg  4 mg IntraVENous Q6H PRN    polyethylene glycol (GLYCOLAX) packet 17 g  17 g Oral Daily PRN    acetaminophen (TYLENOL) tablet 650 mg  650 mg Oral Q6H PRN    Or    acetaminophen (TYLENOL) suppository 650 mg  650 mg Rectal Q6H PRN    guaiFENesin (MUCINEX) extended release tablet 600 mg  600 mg Oral BID    carvedilol (COREG) tablet 6.25 mg  6.25 mg Oral BID WC    sodium bicarbonate tablet 650 mg  650 mg Oral 4x Daily    doxazosin (CARDURA) tablet 4 mg  4 mg Oral Daily    pantoprazole (PROTONIX) tablet 40 mg  40 mg Oral QAM AC    [Held by provider] furosemide (LASIX) tablet 20 mg  20 mg Oral Daily       Signed:  Verdis Prime, MD    Part of this note may have been written by using a voice dictation software.  The note has been proof read but may still contain some grammatical/other typographical errors.

## 2023-07-12 ENCOUNTER — Inpatient Hospital Stay: Admit: 2023-07-12 | Payer: PRIVATE HEALTH INSURANCE | Primary: Diagnostic Radiology

## 2023-07-12 LAB — BASIC METABOLIC PANEL W/ REFLEX TO MG FOR LOW K
Anion Gap: 14 mmol/L (ref 7–16)
BUN: 65 mg/dL — ABNORMAL HIGH (ref 8–23)
CO2: 21 mmol/L (ref 20–29)
Calcium: 8.5 mg/dL — ABNORMAL LOW (ref 8.8–10.2)
Chloride: 106 mmol/L (ref 98–107)
Creatinine: 1.86 mg/dL — ABNORMAL HIGH (ref 0.80–1.30)
Est, Glom Filt Rate: 34 mL/min/{1.73_m2} — ABNORMAL LOW (ref >60)
Glucose: 157 mg/dL — ABNORMAL HIGH (ref 70–99)
Potassium: 4.3 mmol/L (ref 3.5–5.1)
Sodium: 141 mmol/L (ref 136–145)

## 2023-07-12 LAB — CBC
Hematocrit: 25.3 % — ABNORMAL LOW (ref 41.1–50.3)
Hemoglobin: 8.3 g/dL — ABNORMAL LOW (ref 13.6–17.2)
MCH: 28.6 pg (ref 26.1–32.9)
MCHC: 32.8 g/dL (ref 31.4–35.0)
MCV: 87.2 fL (ref 82.0–102.0)
MPV: UNDETERMINED fL (ref 9.4–12.3)
Platelets: 92 10*3/uL — ABNORMAL LOW (ref 150–450)
RBC: 2.9 M/uL — ABNORMAL LOW (ref 4.23–5.6)
RDW: 15.4 % — ABNORMAL HIGH (ref 11.9–14.6)
WBC: 13.7 10*3/uL — ABNORMAL HIGH (ref 4.3–11.1)
nRBC: 0 10*3/uL (ref 0.0–0.2)

## 2023-07-12 LAB — MAGNESIUM: Magnesium: 2.4 mg/dL (ref 1.8–2.4)

## 2023-07-12 LAB — PHOSPHORUS: Phosphorus: 3.7 mg/dL (ref 2.5–4.5)

## 2023-07-12 MED ORDER — LIP-GUARD EX OINT
CUTANEOUS | Status: AC | PRN
Start: 2023-07-12 — End: 2023-07-21

## 2023-07-12 MED ORDER — ALBUTEROL SULFATE (2.5 MG/3ML) 0.083% IN NEBU
RESPIRATORY_TRACT | Status: AC | PRN
Start: 2023-07-12 — End: 2023-07-21
  Administered 2023-07-14 – 2023-07-19 (×4): 2.5 mg via RESPIRATORY_TRACT

## 2023-07-12 MED FILL — NYSTATIN 100000 UNIT/ML MT SUSP: 100000 UNIT/ML | OROMUCOSAL | Qty: 5

## 2023-07-12 MED FILL — IPRATROPIUM-ALBUTEROL 0.5-2.5 (3) MG/3ML IN SOLN: 0.5-2.533 (3) MG/3ML | RESPIRATORY_TRACT | Qty: 3

## 2023-07-12 MED FILL — CARVEDILOL 6.25 MG PO TABS: 6.25 MG | ORAL | Qty: 1

## 2023-07-12 MED FILL — AMOXICILLIN-POT CLAVULANATE 500-125 MG PO TABS: 500-125 MG | ORAL | Qty: 1

## 2023-07-12 MED FILL — ELIQUIS 2.5 MG PO TABS: 2.5 MG | ORAL | Qty: 1

## 2023-07-12 MED FILL — DEXAMETHASONE 4 MG PO TABS: 4 MG | ORAL | Qty: 2

## 2023-07-12 MED FILL — LIP-GUARD EX OINT: CUTANEOUS | Qty: 7

## 2023-07-12 MED FILL — PANTOPRAZOLE SODIUM 40 MG PO TBEC: 40 MG | ORAL | Qty: 1

## 2023-07-12 MED FILL — ROSUVASTATIN CALCIUM 5 MG PO TABS: 5 MG | ORAL | Qty: 2

## 2023-07-12 MED FILL — ARFORMOTEROL TARTRATE 15 MCG/2ML IN NEBU: 152 MCG/2ML | RESPIRATORY_TRACT | Qty: 2

## 2023-07-12 MED FILL — AZITHROMYCIN 250 MG PO TABS: 250 MG | ORAL | Qty: 1

## 2023-07-12 MED FILL — SODIUM CHLORIDE 3 % IN NEBU: 3 % | RESPIRATORY_TRACT | Qty: 4

## 2023-07-12 MED FILL — SODIUM BICARBONATE 650 MG PO TABS: 650 MG | ORAL | Qty: 1

## 2023-07-12 MED FILL — GUAIFENESIN ER 600 MG PO TB12: 600 MG | ORAL | Qty: 1

## 2023-07-12 MED FILL — DOXAZOSIN MESYLATE 4 MG PO TABS: 4 MG | ORAL | Qty: 1

## 2023-07-12 NOTE — Care Coordination-Inpatient (Addendum)
Pt chart reviewed and discussed in AM IDR for continued stay. LOS 8 days. Pt admitted with severe sepsis and pneumonia of right lung due to infectious organism. Per MD, STR recommended, need to contact next-of-kin to discuss. Pt with VA insurance and will require VA authorization.    Pt current with [facility] PTA. Resumption of care order in for PT/OT/RN with SLP added if pt chooses to return home at discharge.     Pt currently on 2L/Min 02 NC and does not utilize 02 at baseline. Pt will require walk-test prior to discharge.     CM to follow 02 stats.     Addendum:   SW CM met with pt at bedside to discuss discharge planning. Pt requested CM to contact Jomarie Longs or Eve to discuss. Pt provided CM contact for daughter, Clarene Critchley, [phone].     SW CM attempted to contact emergency contact Jomarie Longs and was notified CM with wrong number.     CM contacted Eve to discuss discharge planning and obtained correct number for son-in-law Jomarie Longs. CM updated pt chart to reflect correct emergency contact information.    Daughter Eve discussed potential plan for pt to return to her home at discharge with home health services vs STR. CM encouraged Eve to discuss with pt.     DC/POC home with daughter and HH vs STR when medically stable.    CM team will continue to follow for potential discharge needs that may arise.     Gardiner Fanti, MSW, LBSW  Social Work Case Software engineer. [place]

## 2023-07-12 NOTE — Progress Notes (Signed)
ACUTE PHYSICAL THERAPY GOALS:   (Developed with and agreed upon by patient and/or caregiver.)      LTG:  (1.)Paul Medina will move from supine to sit and sit to supine  in bed with INDEPENDENT within 7 treatment day(s).    (2.)Paul Medina will transfer from bed to chair and chair to bed with SUPERVISION using the least restrictive device within 7 treatment day(s).    (3.)Paul Medina will ambulate with SUPERVISION for 200 feet with the least restrictive device within 7 treatment day(s).  ________________________________________________________________________________________________      PHYSICAL THERAPY Daily Note, Re-evaluation, and AM  (Link to Caseload Tracking: PT Visit Days : 1  Acknowledge Orders  Time In/Out  PT Charge Capture  Rehab Caseload Tracker    Paul Medina is a 89 y.o. male   PRIMARY DIAGNOSIS: Severe sepsis (HCC)  Severe sepsis (HCC) [A41.9, R65.20]  Pneumonia of right lung due to infectious organism, unspecified part of lung [J18.9]  Sepsis, due to unspecified organism, unspecified whether acute organ dysfunction present (HCC) [A41.9]       Reason for Referral: Generalized Muscle Weakness (M62.81)  Difficulty in walking, Not elsewhere classified (R26.2)  Inpatient: Payor: [employer] / Plan: [employer] / Product Type: *No Product type* /     ASSESSMENT:     REHAB RECOMMENDATIONS:   Recommendation to date pending progress:  Setting:  Short-term Rehab    Equipment:    None     ASSESSMENT:  Paul Medina admitted with above diagnoses.  He presented to the ER with complaints of cough and shortness of breath.  CT showed worsening R-sided pneumonia and worsening mucus plugging.  Patient was hospitalized at [facility] 2/5-2/10 with a-fib and pneumonia.  Short term rehab was recommended during last admission but patient declined and d/c'd home with HH orders.  Patient reports he was told he would have to be in a facility for 2 weeks for rehab and he did not want that.  Explained to patient that  there was no way for [place] to know how long he would need to stay for rehab but he reports he still plans to return home with [employer].  Patient currently demonstrates decreased strength, balance, mobility, and tolerance for activity.  He would benefit from therapy to address these deficits.  He is on 3L O2 at this time but did not qualify for O2 2 days ago when he was d/c'd from [facility].  Patient educated on need for mobility/time out of bed to improve strength, mobility, and ability to safely return home.       2/19:  Patient appears better today.  O2 down to 2L.  STANDBY ASSIST for bed mobility, sit <> stand, and ambulation.  Able to ambulate into bathroom.  Stood for good bit of time for hygiene, brief management, and rinsing mouth out at sink before walking back into room to sit up in chair.  Mood improved as well.  Goals set at initial assessment remain appropriate.  Continue to recommend SHORT TERM REHAB at d/c as patient lives alone and has been in the hospital for almost 2 weeks between [facility] and [facility].       [employer] AM-PACT "6 Clicks" Basic Mobility Inpatient Short Form  AM-PAC Basic Mobility - Inpatient   How much help is needed turning from your back to your side while in a flat bed without using bedrails?: A Little  How much help is needed moving from lying on your back to sitting on the  side of a flat bed without using bedrails?: A Little  How much help is needed moving to and from a bed to a chair?: A Little  How much help is needed standing up from a chair using your arms?: A Little  How much help is needed walking in hospital room?: A Little  How much help is needed climbing 3-5 steps with a railing?: A Little  AM-PAC Inpatient Mobility Raw Score : 18  AM-PAC Inpatient T-Scale Score : 43.63  Mobility Inpatient CMS 0-100% Score: 46.58  Mobility Inpatient CMS G-Code Modifier : CK    SUBJECTIVE:   Paul Medina agreeable.  Needing to get cleaned up from BM.    Social/Functional Lives With: Alone  Type of  Home: Senior housing apartment  Home Layout: One level  Home Access: Level entry, Occupational psychologist Shower/Tub: Pension scheme manager: Midwife: Grab bars in shower, Grab bars around toilet  Bathroom Accessibility: Accessible  Home Equipment: Engineer, production Help From: Family  Prior Level of Assist for ADLs: Independent  Prior Level of Assist for [employer]: Independent  Prior Level of Assist for Transfers: Independent  Occupation: Retired    OBJECTIVE:     PAIN: VITALS / O2: PRECAUTION / LINES / DRAINS:   Pre Treatment:  generally sore         Post Treatment: generally sore Vitals       Oxygen  O2 Device: Nasal cannula  O2 Flow Rate (L/min): 2 L/min   None    RESTRICTIONS/PRECAUTIONS:  Restrictions/Precautions: Isolation, Fall Risk, Bed Alarm                 GROSS EVALUATION:  Intact Impaired (Comments):   AROM []   Generally decreased,  but functional    PROM []     Strength []   Generally decreased, but functional    Balance []  Sitting - Static: Good  Sitting - Dynamic: Fair, +  Standing - Static: Fair, +  Standing - Dynamic: Fair   Posture []  Forward Head  Rounded Shoulders  Thoracic Kyphosis   Sensation [x]   grossly   Coordination []    Generally decreased, but functional   Tone []   Hypotonus throughout   Edema []     Activity Tolerance []   limited    []       COGNITION/  PERCEPTION: Intact Impaired (Comments):   Orientation [x]      Vision [x]      Hearing []   B hearing aids   Cognition  [x]        MOBILITY: I Mod I S SBA CGA Min Mod Max Total  NT x2 Comments:   Bed Mobility    Rolling []  []  []  []  []  []  []  []  []  []  []     Supine to Sit []  []  []  [x]  []  []  []  []  []  []  []     Scooting []  []  []  [x]  []  []  []  []  []  []  []     Sit to Supine []  []  []  []  []  []  []  []  []  []  []     Transfers    Sit to Stand []  []  []  [x]  []  []  []  []  []  []  []  With walker   Bed to Chair []  []  []  [x]  []  []  []  []  []  []  []     Stand to Sit []  []  []  [x]  []  []  []  []  []  []  []     Toilet  []  []  []  [x]  []  []  []  []  []  []  []      I=Independent, Mod I=Modified  Independent, S=Supervision, SBA=Standby Assistance, CGA=Contact Guard Assistance,   Min=Minimal Assistance, Mod=Moderate Assistance, Max=Maximal Assistance, Total=Total Assistance, NT=Not Tested    GAIT: I Mod I S SBA CGA Min Mod Max Total  NT x2 Comments:   Level of Assistance []  []  []  [x]  []  []  []  []  []  []  []     Distance 15 (x 2)     DME Rolling Walker    Gait Quality Decreased cadence , Decreased step clearance, and Decreased step length    Weightbearing Status     Stairs      I=Independent, Mod I=Modified Independent, S=Supervision, SBA=Standby Assistance, CGA=Contact Guard Assistance,   Min=Minimal Assistance, Mod=Moderate Assistance, Max=Maximal Assistance, Total=Total Assistance, NT=Not Tested    PLAN:   FREQUENCY AND DURATION: 5 times/week, Daily for duration of hospital stay or until stated goals are met, whichever comes first.    THERAPY PROGNOSIS: Good    PROBLEM LIST:   (Skilled intervention is medically necessary to address:)  Decreased ADL/Functional Activities  Decreased Activity Tolerance  Decreased AROM/PROM  Decreased Balance  Decreased Gait Ability  Decreased Strength  Decreased Transfer Abilities INTERVENTIONS PLANNED:   (Benefits and precautions of physical therapy have been discussed with the patient.)  Therapeutic Activity  Therapeutic Exercise/HEP  Gait Training  Education       TREATMENT:       TREATMENT:   Therapeutic Activity (41 Minutes): Therapeutic activity included Supine to Sit, Scooting, Transfer Training, Ambulation on level ground, Sitting balance , Standing balance, and toileting and hygiene in standing to improve functional Activity tolerance, Balance, Coordination, Mobility, Strength, and ROM.    TREATMENT GRID:  N/A    AFTER TREATMENT PRECAUTIONS: Alarm Activated, Bed/Chair Locked, Call light within reach, Chair, and Needs within reach    INTERDISCIPLINARY COLLABORATION:  RN/ PCT    EDUCATION: Education Given To: Patient  Education Provided: Role  of Therapy;Plan of Care  Education Method: Verbal  Education Outcome: Verbalized understanding  Educated patient and/or family/caregiver on the following: Assistive device indications/utilization/safety, Fall prevention strategies, and Home Safety    TIME IN/OUT:  Time In: 1111  Time Out: 1152  Minutes: 41    Georgeanna Harrison, PT

## 2023-07-12 NOTE — ACP (Advance Care Planning) (Signed)
Advance Care Planning   General Advance Care Planning (ACP) Conversation    Date of Conversation: 07/04/2023  Conducted with: Patient with Decision Making Capacity and Legal next of kin  Other persons present: None    Healthcare Decision Maker:    Primary Decision Maker: Marolyn Hammock - Child [phone]    Today we documented Decision Maker(s) consistent with Legal Next of Kin hierarchy.  Content/Action Overview:  No LW HCPOA on file. Daughter Eve legal NOK unless documentation provided.   Reviewed DNR/DNI and patient confirms current DNR status - completed forms on file (place new order if needed)    Length of Voluntary ACP Conversation in minutes:  <16 minutes (Non-Billable)    Gardiner Fanti, MSW

## 2023-07-12 NOTE — Progress Notes (Signed)
Hospitalist Progress Note   Admit Date:  07/04/2023 11:46 AM   Name:  Paul Medina   Age:  89 y.o.  Sex:  male  DOB:  02-24-1931   MRN:  [id number]   Room:  357/01    Presenting/Chief Complaint: Shortness of Breath     Reason(s) for Admission: Severe sepsis (HCC) [A41.9, R65.20]  Pneumonia of right lung due to infectious organism, unspecified part of lung [J18.9]  Sepsis, due to unspecified organism, unspecified whether acute organ dysfunction present [facility]) [A41.9]     Hospital Course:   Paul Medina is a 89 y.o. male with chronic combined heart failure, PAF on Eliquis, HTN, CAD, history of TIA, left eye blindness, OSA on CPAP, and CKD stage III admitted on 2/11 due to severe sepsis and acute respiratory failure from bilateral pneumonia consistent with aspiration and hospital-acquired pneumonia.  He also had recently been diagnosed with COVID and treated.  Blood cultures NGTD  UA unremarkable  2/11 CT chest with worsening right sided infiltrates and improving left sided infiltrates.      Subjective & 24hr Events:   He was laying  comfortably on the bed.  He is very hard of hearing.  He is still feeling weak and fatigued.Marland Kitchen  He saturating well on 2 LNC.  Denies chest pain.    Denies fevers.  Denies N/V.    Assessment & Plan:       Severe sepsis (HCC)    Bilateral pneumonia    Aspiration pneumonia (HCC)  Afebrile, leukocytosis improving  Currently patient is saturating well on 2 LNC and wean oxygen as tolerated  Continue Augmentin 500 mg twice daily -EOT 2/20  Continue azithromycin 250 mg daily x 5 days-EOT 2/19        Acute respiratory failure with hypoxia  Patient down to 2 L nasal cannula  Continue Brovana 15 mcg twice daily, DuoNeb 4 times daily, Mucinex 600 mg twice daily  Follow-up with chest x-ray      HTN (hypertension)  Blood pressure stable  Continue Coreg 6.25 mg twice daily      Stage 3 chronic kidney disease (HCC)  Creatinine slowly improving and baseline is 1.4  Continue to hold Lasix  Status  post IVF  Check BMP daily      COVID  Patient diagnosed on 2/5  2/16 CRP 6.8 => not a candidate for baricitinib  Continue Decadron       Chronic combined systolic and diastolic heart failure (HCC)  Euvolemic  Continue to hold Lasix      Paroxysmal atrial fibrillation (HCC)  Continue Coreg 6.25 mg twice daily  Continue Eliquis 2.5 mg twice daily      Coronary atherosclerosis of native coronary artery      History of TIA (transient ischemic attack)      OSA (obstructive sleep apnea)  Continue CPAP at night      History of B-cell lymphoma      DNR (do not resuscitate)    Dispo anticipate hospital stay for 1 to 2 days.    Communication discussed with the family at the bedside    Anticipated Discharge Arrangements:   Home Health    PT/OT evals ordered?  Therapy evals ordered  Diet:  ADULT ORAL NUTRITION SUPPLEMENT; Breakfast, Lunch, Dinner; Educational psychologist Protein Oral Supplement  ADULT DIET; Dysphagia - Minced and Moist; 1500 ml; No Drinking Straws  VTE prophylaxis: Already on anticoagulation  Code status: DNR      Non-peripheral Lines and Tubes (  if present):          Telemetry (if present):  Cardiac/Telemetry Monitor On: No        Hospital Problems:  Principal Problem:    Severe sepsis (HCC)  Active Problems:    Chronic combined systolic and diastolic heart failure (HCC)    HTN (hypertension)    HLD (hyperlipidemia)    Coronary atherosclerosis of native coronary artery    History of TIA (transient ischemic attack)    OSA (obstructive sleep apnea)    Stage 3 chronic kidney disease (HCC)    COVID    Atrial fibrillation (HCC)    Bilateral pneumonia    Acute respiratory failure with hypoxia (HCC)    Aspiration pneumonia (HCC)    History of B-cell lymphoma    DNR (do not resuscitate)  Resolved Problems:    * No resolved hospital problems. *      Objective:   Patient Vitals for the past 24 hrs:   Temp Pulse Resp BP SpO2   07/12/23 0820 98.6 F (37 C) 100 18 (!) 119/54 95 %   07/12/23 0745 -- 99 18 -- 99 %    07/11/23 2117 -- 73 -- -- 97 %   07/11/23 1955 97.7 F (36.5 C) 61 18 101/75 97 %       Oxygen Therapy  SpO2: 95 %  Pulse via Oximetry: 83 beats per minute  Pulse Oximeter Device Mode: Intermittent  Pulse Oximeter Device Location: Left, Finger  O2 Device: Nasal cannula  Skin Assessment: Clean, dry, & intact  FiO2 : 32 %  O2 Flow Rate (L/min): 2 L/min    Estimated body mass index is 29.05 kg/m as calculated from the following:    Height as of this encounter: 1.676 m (5\' 6" ).    Weight as of this encounter: 81.6 kg (180 lb).    Intake/Output Summary (Last 24 hours) at 07/12/2023 1334  Last data filed at 07/12/2023 0820  Gross per 24 hour   Intake 300 ml   Output 1060 ml   Net -760 ml         Physical Exam:   General:    Appears worn out and frail  Head:  Normocephalic, atraumatic  Eyes:  Sclerae appear normal.  Pupils equally round.  ENT:  Nares appear normal.  Moist oral mucosa  Neck:  No restricted ROM.  Trachea midline   CV:   RRR.  No m/r/g.  No jugular venous distension.  Lungs:   Bilateral scattered rhonchi with coarse wheezing.  Symmetric expansion.Abdomen:   Soft, nontender, nondistended.  Extremities: No cyanosis or clubbing.  No edema  Skin:     No rashes.  Normal coloration.   Warm and dry.    Neuro:  CN II-XII grossly intact.    Psych:  Depressed affect    Imaging:    Chest X-Ray 2/11 personally reviewed and interpreted by me        I have personally reviewed labs and tests:  Recent Labs:  Recent Results (from the past 48 hour(s))   CBC    Collection Time: 07/11/23  6:04 AM   Result Value Ref Range    WBC 15.6 (H) 4.3 - 11.1 K/uL    RBC 3.14 (L) 4.23 - 5.6 M/uL    Hemoglobin 9.2 (L) 13.6 - 17.2 g/dL    Hematocrit 29.5 (L) 41.1 - 50.3 %    MCV 87.3 82.0 - 102.0 FL    MCH 29.3 26.1 - 32.9 PG  MCHC 33.6 31.4 - 35.0 g/dL    RDW 29.5 (H) 62.1 - 14.6 %    Platelets 115 (L) 150 - 450 K/uL    MPV Unable to calculate. Recommend adding IPF. 9.4 - 12.3 FL    nRBC 0.00 0.0 - 0.2 K/uL   Basic Metabolic Panel w/  Reflex to MG    Collection Time: 07/11/23  6:04 AM   Result Value Ref Range    Sodium 141 136 - 145 mmol/L    Potassium 3.7 3.5 - 5.1 mmol/L    Chloride 103 98 - 107 mmol/L    CO2 22 20 - 29 mmol/L    Anion Gap 16 7 - 16 mmol/L    Glucose 128 (H) 70 - 99 mg/dL    BUN 59 (H) 8 - 23 MG/DL    Creatinine 3.08 (H) 0.80 - 1.30 MG/DL    Est, Glom Filt Rate 31 (L) >60 ml/min/1.16m2    Calcium 8.7 (L) 8.8 - 10.2 MG/DL   CBC    Collection Time: 07/12/23  6:32 AM   Result Value Ref Range    WBC 13.7 (H) 4.3 - 11.1 K/uL    RBC 2.90 (L) 4.23 - 5.6 M/uL    Hemoglobin 8.3 (L) 13.6 - 17.2 g/dL    Hematocrit 65.7 (L) 41.1 - 50.3 %    MCV 87.2 82.0 - 102.0 FL    MCH 28.6 26.1 - 32.9 PG    MCHC 32.8 31.4 - 35.0 g/dL    RDW 84.6 (H) 96.2 - 14.6 %    Platelets 92 (L) 150 - 450 K/uL    MPV Unable to calculate. Recommend adding IPF. 9.4 - 12.3 FL    nRBC 0.00 0.0 - 0.2 K/uL   Basic Metabolic Panel w/ Reflex to MG    Collection Time: 07/12/23  6:32 AM   Result Value Ref Range    Sodium 141 136 - 145 mmol/L    Potassium 4.3 3.5 - 5.1 mmol/L    Chloride 106 98 - 107 mmol/L    CO2 21 20 - 29 mmol/L    Anion Gap 14 7 - 16 mmol/L    Glucose 157 (H) 70 - 99 mg/dL    BUN 65 (H) 8 - 23 MG/DL    Creatinine 9.52 (H) 0.80 - 1.30 MG/DL    Est, Glom Filt Rate 34 (L) >60 ml/min/1.23m2    Calcium 8.5 (L) 8.8 - 10.2 MG/DL   Magnesium    Collection Time: 07/12/23  6:32 AM   Result Value Ref Range    Magnesium 2.4 1.8 - 2.4 mg/dL   Phosphorus    Collection Time: 07/12/23  6:32 AM   Result Value Ref Range    Phosphorus 3.7 2.5 - 4.5 MG/DL       No results for input(s): "COVID19" in the last 72 hours.    Current Meds:  Current Facility-Administered Medications   Medication Dose Route Frequency    medicated lip ointment (BLISTEX)   Topical PRN    dexAMETHasone (DECADRON) tablet 6 mg  6 mg Oral Daily    amoxicillin-clavulanate (AUGMENTIN) 500-125 MG per tablet 1 tablet  1 tablet Oral 2 times per day    ipratropium 0.5 mg-albuterol 2.5 mg (DUONEB) nebulizer  solution 1 Dose  1 Dose Inhalation BID RT    nystatin (MYCOSTATIN) 100000 UNIT/ML suspension 500,000 Units  5 mL Oral 4x Daily    apixaban (ELIQUIS) tablet 2.5 mg  2.5 mg Oral 2 times per day  rosuvastatin (CRESTOR) tablet 10 mg  10 mg Oral Nightly    sodium chloride (Inhalant) 3 % nebulizer solution 4 mL  4 mL Nebulization PRN    arformoterol tartrate (BROVANA) nebulizer solution 15 mcg  15 mcg Nebulization BID RT    prednisoLONE acetate (PRED FORTE) 1 % ophthalmic suspension 1 drop  1 drop Left Eye 6 times per day    atropine 1 % ophthalmic solution 1 drop  1 drop Left Eye Nightly    brimonidine (ALPHAGAN) 0.2 % ophthalmic solution 1 drop  1 drop Left Eye TID    sodium chloride flush 0.9 % injection 5-40 mL  5-40 mL IntraVENous 2 times per day    sodium chloride flush 0.9 % injection 5-40 mL  5-40 mL IntraVENous PRN    0.9 % sodium chloride infusion   IntraVENous PRN    potassium chloride (KLOR-CON M) extended release tablet 40 mEq  40 mEq Oral PRN    Or    potassium bicarb-citric acid (EFFER-K) effervescent tablet 40 mEq  40 mEq Oral PRN    Or    potassium chloride 10 mEq/100 mL IVPB (Peripheral Line)  10 mEq IntraVENous PRN    magnesium sulfate 2000 mg in 50 mL IVPB premix  2,000 mg IntraVENous PRN    ondansetron (ZOFRAN-ODT) disintegrating tablet 4 mg  4 mg Oral Q8H PRN    Or    ondansetron (ZOFRAN) injection 4 mg  4 mg IntraVENous Q6H PRN    polyethylene glycol (GLYCOLAX) packet 17 g  17 g Oral Daily PRN    acetaminophen (TYLENOL) tablet 650 mg  650 mg Oral Q6H PRN    Or    acetaminophen (TYLENOL) suppository 650 mg  650 mg Rectal Q6H PRN    guaiFENesin (MUCINEX) extended release tablet 600 mg  600 mg Oral BID    carvedilol (COREG) tablet 6.25 mg  6.25 mg Oral BID WC    sodium bicarbonate tablet 650 mg  650 mg Oral 4x Daily    doxazosin (CARDURA) tablet 4 mg  4 mg Oral Daily    pantoprazole (PROTONIX) tablet 40 mg  40 mg Oral QAM AC    [Held by provider] furosemide (LASIX) tablet 20 mg  20 mg Oral Daily        Signed:  Verdis Prime, MD    Part of this note may have been written by using a voice dictation software.  The note has been proof read but may still contain some grammatical/other typographical errors.

## 2023-07-12 NOTE — Plan of Care (Signed)
Problem: Discharge Planning  Goal: Discharge to home or other facility with appropriate resources  Outcome: Progressing     Problem: Safety - Adult  Goal: Free from fall injury  Outcome: Progressing     Problem: ABCDS Injury Assessment  Goal: Absence of physical injury  Outcome: Progressing     Problem: Respiratory - Adult  Goal: Achieves optimal ventilation and oxygenation  Outcome: Progressing     Problem: Skin/Tissue Integrity  Goal: Skin integrity remains intact  Description: 1.  Monitor for areas of redness and/or skin breakdown  2.  Assess vascular access sites hourly  3.  Every 4-6 hours minimum:  Change oxygen saturation probe site  4.  Every 4-6 hours:  If on nasal continuous positive airway pressure, respiratory therapy assess nares and determine need for appliance change or resting period  Outcome: Progressing

## 2023-07-12 NOTE — Progress Notes (Signed)
GOALS:  LTG: Patient will maintain adequate hydration/nutrition with optimum safety and efficiency of swallowing function with PO intake without overt signs or symptoms of aspiration for the highest appropriate diet level.  STG:   Patient will consume minced and moist textures and mildly thick liquid (nectar) (no straws) without overt signs or symptoms of airway compromise. Liquids downgraded 07/12/2023  Patient will safely ingest diet trials during therapeutic feedings with SLP without overt signs or symptoms of respiratory compromise in efforts to advance diet. Ongoing 07/12/2023  Patient will complete a Modified Barium Swallow study to fully assess physiology and anatomy of the swallow and determine safest appropriate diet and/or rehabilitation strategies, as medically indicated. COMPLETED 07/06/23    SPEECH LANGUAGE PATHOLOGY: Dysphagia Daily Note #2    Acknowledge Order  I  Therapy Time  I   Charges     I  Rehab Caseload Tracker  NAME: Paul Medina  DOB: 09-May-1931  MRN: [id number]    ADMISSION DATE: 07/04/2023  PRIMARY DIAGNOSIS: Severe sepsis (HCC)    ICD-10: Treatment Diagnosis: R13.12 Dysphagia, Oropharyngeal Phase    RECOMMENDATIONS   Diet:    Minced and Moist  Mildly Thick Liquids (Nectar)- NO STRAWS    Medication: whole floated in puree or applesauce and crushed in puree or applesauce, as permitted by pharmacy   Compensatory Swallowing Strategies:   Upright for all PO  Set-up assistance  Swallow with intent  Slow rate of PO intake  Small bites and sips  Alternate liquids/solids  No straws  Liquid wash  Remain upright for 20-30 min after any PO   Therapeutic Intervention:   Patient/family education  Instrumental swallow assessment  Dysphagia treatment   Patient continues to require skilled intervention:  Yes. Recommend ongoing speech therapy services during this hospitalization.     Anticipated Discharge Needs: Ongoing speech therapy is recommended at next level of care.      ASSESSMENT    Intermittent  coughing noted with and without PO intake during today's session. Patient agreeable to trial mildly thick liquid (nectar) liquids, reporting improved tolerance and control with thickened liquids and requesting this consistency for liquids. Requested repeat CXR due to suspected aspiration, as once patient began consuming thickened liquids, cough ceased.     Recommend to continue minced and moist foods, downgrade to mildly thick liquid (nectar) - no straws. Patient agreeable to diet modification and continued speech therapy will follow for dysphagia during acute phase of care.     GENERAL    Subjective: Patient sitting upright in chair ready for lunch meal.      Reason for Consult: concern for aspiration.     History of Present Injury/Illness: Mr. Finigan  has a past medical history of Arthritis, Asthma, Chronic bronchitis (HCC), Hypertension, and Lung cancer (HCC).. He also  has no past surgical history on file.  Precautions/Allergies: Ciprofloxacin, Doxycycline, Lisinopril, Other, Sulfa antibiotics, Sulfasalazine, and Sulfur     Observations:  Alertness: Alert  Drowsy  Voice: WFL  Speech: Intelligible  Expressive Language: Fluent and Able to communicate wants and needs  Receptive Language: Answers yes/no questions, Follows basic commands, and Appropriately responds to questions  Cognition: Appropriately attends to clinician    Prior Dysphagia History: Seen last week with recommendations for minced and moist solids, thin liquids due to patient report of esophageal dysphagia.   Prior Instrumental Assessment: none    Current Diet:  minced and moist  thin liquids     Respiratory Status: Nasal Cannula    Pain:  Patient does not c/o pain  Pain intervention: Repositioned  Pain response: Patient satisfied    OBJECTIVE    Dysphagia: Patient agreeable to PO intake from lunch meal consuming ~4 oz of thin liquids via cup,  4oz of purees from meal tray, and 4oz of mildly thick liquid (nectar) made following meal. All  additional solid trials deferred by the patient.   Oral phase was unremarkable with adequate mastication and clearance of bolus. Pharyngeal phase of swallow was unremarkable, though intermittent coughing was observed during PO intake and was not exclusive to PO trials. Patient was provided with 4oz trials of mildly thick liquid (nectar) with patient report of improved tolerance and control of bolus, though unable to provide more specifics. Patient agreeable to transition to mildly thick liquid (nectar) due to cessation of cough response following trials of mildly thick liquid (nectar).      Speech therapy will follow for dysphagia during acute phase of care. Recommending post acute speech therapy.     Dysphagia Outcome and Severity Scale (DOSS)  Score: 4 Description   []  7 Normal in all situations   []  6 Within Functional Limits/ Modified independent   []  5 Mild Dysphagia: Distant Supervision. May need one diet consistency      restricted.   [x]  4 Mild-Moderate Dysphagia: Intermittent supervision/cuing. One-two diet    consistencies restricted.   []  3 Moderate Dysphagia: Total assistance, supervision, or strategies.       Two or more diet consistencies restricted.   []  2 Moderate-Severe Dysphagia: Maximum assistance or maximum use     of strategies with partial po intake   []  1 Severe dysphagia- NPO. Unable to tolerate any po safely       PLAN    Duration/Frequency: Continue to follow patient 3x/week for duration of hospitalization and/or until goals met    Rehabilitation Potential For Stated Goals: Good    Interdisciplinary Collaboration: MD/ PA/ NP  and RN/ PCT    Medical Necessity    Skilled intervention continues to be required due to patient still consuming a modified diet and medical complications.    Education:   Patient and/or family/caregiver educated on Results of evaluation, Role of speech therapy, Diet recommendations, SLP recommendations, and SLP plan  Education response: Verbalizes understanding and  Needs reinforcement    Safety:   Call light within reach  In Bed  RN notified   Family/visitors at bedside    Therapy Time:  Time In: 1210  Time Out: 1245  Minutes: 35    Denver Faster, M.S., CCC-SLP   07/12/2023 1:50 PM

## 2023-07-13 LAB — URINALYSIS
BACTERIA, URINE: 0 /[HPF]
Bilirubin, Urine: NEGATIVE
Blood, Urine: NEGATIVE
Glucose, Ur: NEGATIVE mg/dL
Ketones, Urine: NEGATIVE mg/dL
Nitrite, Urine: NEGATIVE
Specific Gravity, UA: 1.017 (ref 1.001–1.023)
Urobilinogen, Urine: 0.2 U/dL (ref 0.2–1.0)
pH, Urine: 5 (ref 5.0–9.0)

## 2023-07-13 LAB — CBC WITH AUTO DIFFERENTIAL
Basophils %: 0.6 % (ref 0.0–2.0)
Basophils Absolute: 0.06 10*3/uL (ref 0.00–0.20)
Eosinophils %: 0 % — ABNORMAL LOW (ref 0.5–7.8)
Eosinophils Absolute: 0 10*3/uL (ref 0.00–0.80)
Hematocrit: 25.6 % — ABNORMAL LOW (ref 41.1–50.3)
Hemoglobin: 8.6 g/dL — ABNORMAL LOW (ref 13.6–17.2)
Immature Granulocytes %: 2.6 % (ref 0.0–5.0)
Immature Granulocytes Absolute: 0.28 10*3/uL (ref 0.0–0.5)
Lymphocytes %: 3.4 % — ABNORMAL LOW (ref 13.0–44.0)
Lymphocytes Absolute: 0.36 10*3/uL — ABNORMAL LOW (ref 0.50–4.60)
MCH: 28.8 pg (ref 26.1–32.9)
MCHC: 33.6 g/dL (ref 31.4–35.0)
MCV: 85.6 fL (ref 82.0–102.0)
MPV: UNDETERMINED fL (ref 9.4–12.3)
Monocytes %: 3.1 % — ABNORMAL LOW (ref 4.0–12.0)
Monocytes Absolute: 0.33 10*3/uL (ref 0.10–1.30)
Neutrophils %: 90.3 % — ABNORMAL HIGH (ref 43.0–78.0)
Neutrophils Absolute: 9.71 10*3/uL — ABNORMAL HIGH (ref 1.70–8.20)
Platelets: 91 10*3/uL — ABNORMAL LOW (ref 150–450)
RBC: 2.99 M/uL — ABNORMAL LOW (ref 4.23–5.6)
RDW: 15.2 % — ABNORMAL HIGH (ref 11.9–14.6)
WBC: 10.7 10*3/uL (ref 4.3–11.1)
nRBC: 0 10*3/uL (ref 0.0–0.2)

## 2023-07-13 LAB — BASIC METABOLIC PANEL W/ REFLEX TO MG FOR LOW K
Anion Gap: 14 mmol/L (ref 7–16)
BUN: 56 mg/dL — ABNORMAL HIGH (ref 8–23)
CO2: 22 mmol/L (ref 20–29)
Calcium: 8.8 mg/dL (ref 8.8–10.2)
Chloride: 101 mmol/L (ref 98–107)
Creatinine: 1.55 mg/dL — ABNORMAL HIGH (ref 0.80–1.30)
Est, Glom Filt Rate: 42 mL/min/{1.73_m2} — ABNORMAL LOW (ref >60)
Glucose: 154 mg/dL — ABNORMAL HIGH (ref 70–99)
Potassium: 4.4 mmol/L (ref 3.5–5.1)
Sodium: 137 mmol/L (ref 136–145)

## 2023-07-13 LAB — PHOSPHORUS: Phosphorus: 3.1 mg/dL (ref 2.5–4.5)

## 2023-07-13 LAB — MAGNESIUM: Magnesium: 2.4 mg/dL (ref 1.8–2.4)

## 2023-07-13 LAB — OCCULT BLOOD, FECAL: POC Occult Blood, Fecal: NEGATIVE

## 2023-07-13 MED ORDER — CULTURELLE PO CAPS
Freq: Every day | ORAL | Status: DC
Start: 2023-07-13 — End: 2023-07-21
  Administered 2023-07-14 – 2023-07-21 (×8): 1 via ORAL

## 2023-07-13 MED FILL — ELIQUIS 2.5 MG PO TABS: 2.5 MG | ORAL | Qty: 1

## 2023-07-13 MED FILL — ARFORMOTEROL TARTRATE 15 MCG/2ML IN NEBU: 152 MCG/2ML | RESPIRATORY_TRACT | Qty: 2

## 2023-07-13 MED FILL — PANTOPRAZOLE SODIUM 40 MG PO TBEC: 40 MG | ORAL | Qty: 1

## 2023-07-13 MED FILL — AMOXICILLIN-POT CLAVULANATE 500-125 MG PO TABS: 500-125 MG | ORAL | Qty: 1

## 2023-07-13 MED FILL — CARVEDILOL 6.25 MG PO TABS: 6.25 MG | ORAL | Qty: 1

## 2023-07-13 MED FILL — DEXAMETHASONE 4 MG PO TABS: 4 MG | ORAL | Qty: 2

## 2023-07-13 MED FILL — ROSUVASTATIN CALCIUM 5 MG PO TABS: 5 MG | ORAL | Qty: 2

## 2023-07-13 MED FILL — NYSTATIN 100000 UNIT/ML MT SUSP: 100000 UNIT/ML | OROMUCOSAL | Qty: 5

## 2023-07-13 MED FILL — GUAIFENESIN ER 600 MG PO TB12: 600 MG | ORAL | Qty: 1

## 2023-07-13 MED FILL — IPRATROPIUM-ALBUTEROL 0.5-2.5 (3) MG/3ML IN SOLN: 0.5-2.533 (3) MG/3ML | RESPIRATORY_TRACT | Qty: 3

## 2023-07-13 MED FILL — DOXAZOSIN MESYLATE 4 MG PO TABS: 4 MG | ORAL | Qty: 1

## 2023-07-13 NOTE — Progress Notes (Signed)
GOALS:  LTG: Patient will maintain adequate hydration/nutrition with optimum safety and efficiency of swallowing function with PO intake without overt signs or symptoms of aspiration for the highest appropriate diet level.  STG:   Patient will consume minced and moist textures and mildly thick liquid (nectar) (no straws) without overt signs or symptoms of airway compromise. Liquids downgraded 07/12/2023, met 07/13/23  Patient will consume thin liquids in isolation without overt signs or symptoms of respiratory compromise when using compensatory strategies. Added 07/13/23  Patient will safely ingest diet trials during therapeutic feedings with SLP without overt signs or symptoms of respiratory compromise in efforts to advance diet. Ongoing 07/13/2023  Patient will complete a Modified Barium Swallow study to fully assess physiology and anatomy of the swallow and determine safest appropriate diet and/or rehabilitation strategies, as medically indicated. COMPLETED 07/06/23    SPEECH LANGUAGE PATHOLOGY: Dysphagia Daily Note #3    Acknowledge Order  I  Therapy Time  I   Charges     I  Rehab Caseload Tracker  NAME: Paul Medina  DOB: [birth year]  MRN: [id number]    ADMISSION DATE: 07/04/2023  PRIMARY DIAGNOSIS: Severe sepsis (HCC)    ICD-10: Treatment Diagnosis: R13.12 Dysphagia, Oropharyngeal Phase    RECOMMENDATIONS   Diet:    Minced and Moist  Mildly Thick Liquids (Nectar)- NO STRAWS  Patient may have thin water by cup (NO STRAWS) in isolation between meals when using compensatory strategies outlined below    Medication: whole floated in puree or applesauce and crushed in puree or applesauce, as permitted by pharmacy   Compensatory Swallowing Strategies:   Upright for all PO  Set-up assistance  Swallow with intent  Slow rate of PO intake  Small bites and sips  Alternate liquids/solids  No straws  Liquid wash  Remain upright for 20-30 min after any PO   Therapeutic Intervention:   Patient/family education  Instrumental  swallow assessment  Dysphagia treatment   Patient continues to require skilled intervention:  Yes. Recommend ongoing speech therapy services during this hospitalization.     Anticipated Discharge Needs: Ongoing speech therapy is recommended at next level of care.      ASSESSMENT    Patient tolerating minced and moist diet and mildly thick liquids by cup. He also consumed thin liquids by cup with no overt signs or symptoms of respiratory compromise, but patient reported he felt "less burning" when liquids were thickened. Mild cough noted with thin liquids when presented by straw, which is consistent with prior MBS.    Recommend to continue minced and moist foods, and mildly thick liquid (nectar) - no straws. Patient may have thin water without straws in between meals after oral care.    GENERAL    Subjective: Patient sitting upright in chair ready for lunch meal.      Reason for Consult: concern for aspiration.     History of Present Injury/Illness: Mr. Deroos  has a past medical history of Arthritis, Asthma, Chronic bronchitis (HCC), Hypertension, and Lung cancer (HCC).. He also  has no past surgical history on file.  Precautions/Allergies: Ciprofloxacin, Doxycycline, Lisinopril, Other, Sulfa antibiotics, Sulfasalazine, and Sulfur     Observations:  Alertness: Alert  Drowsy  Voice: WFL  Speech: Intelligible  Expressive Language: Fluent and Able to communicate wants and needs  Receptive Language: Answers yes/no questions, Follows basic commands, and Appropriately responds to questions  Cognition: Appropriately attends to clinician    Prior Dysphagia History: Seen last week with recommendations for minced and moist  solids, thin liquids due to patient report of esophageal dysphagia.   Prior Instrumental Assessment: none    Current Diet:  minced and moist  thin liquids     Respiratory Status: Room air    Pain:  Patient does not c/o pain  Pain intervention: None- No pain observed  Pain response: Patient  satisfied    OBJECTIVE    Dysphagia: Patient eating from lunch meal consuming 1/2 minced and moist textures from tray and 8 oz mildly thick liquids by cup. Initial trial of thin liquids with straw sips prior to meal resulted in weak throat clear. Subsequent trials of thin liquids presented by cup sip prior to and after solids was swallowed without overt signs or symptoms of respiratory compromise.    Speech therapy will follow for dysphagia during acute phase of care. Recommending post acute speech therapy.     Dysphagia Outcome and Severity Scale (DOSS)  Score: 4 Description   []  7 Normal in all situations   []  6 Within Functional Limits/ Modified independent   []  5 Mild Dysphagia: Distant Supervision. May need one diet consistency      restricted.   [x]  4 Mild-Moderate Dysphagia: Intermittent supervision/cuing. One-two diet    consistencies restricted.   []  3 Moderate Dysphagia: Total assistance, supervision, or strategies.       Two or more diet consistencies restricted.   []  2 Moderate-Severe Dysphagia: Maximum assistance or maximum use     of strategies with partial po intake   []  1 Severe dysphagia- NPO. Unable to tolerate any po safely       PLAN    Duration/Frequency: Continue to follow patient 3x/week for duration of hospitalization and/or until goals met    Rehabilitation Potential For Stated Goals: Good    Interdisciplinary Collaboration: RN/ PCT    Medical Necessity    Skilled intervention continues to be required due to patient still consuming a modified diet and medical complications.    Education:   Patient and/or family/caregiver educated on Diet recommendations, SLP recommendations, and SLP plan  Education response: Verbalizes understanding    Safety:   Call light within reach  In chair  RN notified     Therapy Time:  Time In: 1211  Time Out: 1240  Minutes: [address], [place], CCC-SLP   07/13/2023 1:08 PM

## 2023-07-13 NOTE — Progress Notes (Signed)
ACUTE OCCUPATIONAL THERAPY GOALS:   (Developed with and agreed upon by patient and/or caregiver.)  1. Patient will perform grooming with SPV and adaptive equipment as needed. PROGRESSING 07/11/23   2. Patient will perform upper body dressing with SPV and adaptive equipment as needed.  3. Patient will perform lower body dressing with SPV and adaptive equipment as needed.  4. Patient will perform bathing with SPV and adaptive equipment as needed.  5. Patient will perform toileting and toilet transfer with SPV and adaptive equipment as needed.  6. Patient will perform ADL functional mobility and tranfers in room with SPV and adaptive equipment as needed.   PROGRESSING 07/13/23   7. Patient/family to demonstrate knowledge of home safety and DME recommendations.    Timeframe: 7 visits     OCCUPATIONAL THERAPY Daily Note and AM       OT Visit Days: 5  Acknowledge Orders  Time  OT Charge Capture  Rehab Caseload Tracker      Hanford Lust is a 89 y.o. male   PRIMARY DIAGNOSIS: Severe sepsis (HCC)  Severe sepsis (HCC) [A41.9, R65.20]  Pneumonia of right lung due to infectious organism, unspecified part of lung [J18.9]  Sepsis, due to unspecified organism, unspecified whether acute organ dysfunction present (HCC) [A41.9]       Reason for Referral: Generalized Muscle Weakness (M62.81)  Other lack of cordination (R27.8)  Difficulty in walking, Not elsewhere classified (R26.2)  Dizziness and Giddiness (R42)  Inpatient: Payor: [employer] / Plan: [employer] / Product Type: *No Product type* /     ASSESSMENT:     REHAB RECOMMENDATIONS:   Recommendation to date pending progress:  Setting:  Short-term Rehab  Continued occupational therapy recommended at discharge    Equipment:    None     ASSESSMENT:  Mr. Rumple is admitted for the above diagnoses and presents with overall deficits in strength, functional mobility and ADL performance.  He lives alone in a senior living apartment community with level entry. At baseline  (prior to recent hospitalizations), he reports independence with ADLs and mobility (sometimes uses rollator). Today, he was able to perform bed mobility, household mobility, and grooming tasks seated in recliner with setup. Patient has been using urinal for toileting--donned new brief at EOB. He remains on 3L NC during session with episodes of wet/productive sounding cough. SpO2 97%. Presents with decreased activity tolerance impacting his ability to safely and independently perform ADLs. He was left in recliner chair with all needs met and in reach. Xray tech at bedside. Pt is functioning below their baseline and will benefit from skilled OT during hospital stay. Anticipate pt will benefit from continued therapy upon discharge.     07/06/2023: Patient upon arrival reclined in bed, on 3L of O2 reports shortness of breath, spO2 95%. Patient completed supine to sit with stand by assist. Patient stood with rolling walker with stand by assist and performed functional mobility with rolling walker stand by assist to sit in bedside chair. Patient needed extended time to complete all tasks today. RT notified. Lengthy discussion with patient about discharge planning, patient is confused, he did not want to have this conversation at this time. Patient will continue to benefit from skilled OT services to maximize independence and safety with ADL tasks.     07/10/23 900am Patient supine in bed on 3 LPM nc. MD present in room to encourage oob activity. Patient agreeable O2 sat 91-93%. He rolled right and left with contact guard assist and was  assisted with rear peri care and brief change. He sat on the eob with min assist and sat was 85% but rebounded quickly with breathing through his nose to low 90's. He drank with mod cues for small sips and chin tuck. He stood and took steps to recliner with min assist with [employer]. He was set up in recliner and was educated on OT poc and recommendations.  He was agreeable to SHORT TERM  REHAB as he is not at his baseline and lives alone. Tab alert in place. HR  varied during the session and nursing advised. Will follow.    07/11/23 830am patient sitting up on eob completing teeth brushing  with nursing student. He  stood and took steps to recliner with contact guard assist with [employer]. He was set up in recliner and eating with set up. Resp therapy started breathing treatment. He appears stronger today and o2 sat staying 92%+. HR around 100 consistently He would continue to benefit from skilled OT services and SHORT TERM REHAB. Will follow..      07/13/23 1120 patient asleep in recliner. He awoke with cues. He is on room air and o2 sat 92%. He performed breathing exercises with his flutter valve and o2 increased to 95%. He ambulated in the room with contact guard assist with [facility]. He returned to his chair. He reported he did not sleep all night. CNA entered and reported she has been walking him to the restroom.  He returned to his recliner and was set up with his needs and tab alert in place. He is making progress with mobility. Recommend SHORT TERM REHAB.      [employer] AM-PACT "6 Clicks" Daily Activity Inpatient Short Form:    AM-PAC Daily Activity - Inpatient   How much help is needed for putting on and taking off regular lower body clothing?: A Lot  How much help is needed for bathing (which includes washing, rinsing, drying)?: A Lot  How much help is needed for toileting (which includes using toilet, bedpan, or urinal)?: A Little  How much help is needed for putting on and taking off regular upper body clothing?: None  How much help is needed for taking care of personal grooming?: None  How much help for eating meals?: None  AM-PAC Inpatient Daily Activity Raw Score: 19  AM-PAC Inpatient ADL T-Scale Score : 40.22  ADL Inpatient CMS 0-100% Score: 42.8  ADL Inpatient CMS G-Code Modifier : CK           SUBJECTIVE:     Mr. Dozier stated he did not sleep all  night    Social/Functional Lives With: Alone  Type of Home: Senior housing apartment  Home Layout: One level  Home Access: Level entry, Occupational psychologist Shower/Tub: Pension scheme manager: Midwife: Grab bars in shower, Grab bars around toilet  Bathroom Accessibility: Accessible  Home Equipment: Rollator  Receives Help From: Family  Prior Level of Assist for ADLs: Independent  Prior Level of Assist for Homemaking: Independent  Prior Level of Assist for Transfers: Independent  Occupation: Retired    OBJECTIVE:     LINES / DRAINS / AIRWAY: None    RESTRICTIONS/PRECAUTIONS:  Restrictions/Precautions: Isolation, Fall Risk, Bed Alarm  contact  PAIN: VITALS / O2:   Pre Treatment:       none    Post Treatment: none       Vitals      80s during session  Oxygen   RA   92%  Post flutter valve and mobilty o2 95% ra     GROSS EVALUATION: INTACT IMPAIRED   (See Comments)   UE AROM [x]  []    UE PROM [x]  []    Strength []   Generalized weakness     Posture / Balance []   Fair using RW for functional mobility and transfers   Sensation [x]      Coordination [x]        Tone [x]        Edema []     Activity Tolerance []   Decreased from baseline     Hand Dominance R []  L []       COGNITION/  PERCEPTION: INTACT IMPAIRED   (See Comments)   Orientation [x]      Vision [x]      Hearing []   Hearing aides B    Cognition  [x]   Follows simple directions   Perception []   impaired     MOBILITY: I Mod I S SBA CGA Min Mod Max Total  NT x2 Comments:   Bed Mobility    Rolling []  []  []  []  []  []  []  []  []  [x]  []     Supine to Sit []  []  []  []  []  []  []  []  []  [x]  []     Scooting []  []  []  []  []  []  []  []  []  [x]  []     Sit to Supine []  []  []  []  []  []  []  []  []  [x]  []     Transfers    Sit to Stand []  []  []  []  [x]  []  []  []  []  []  []     Bed to Chair []  []  []  []  []  []  []  []  []  []  []  RW   Stand to Sit []  []  []  []  [x]  []  []  []  []  []  []     Tub/Shower []  []  []  []  []  []  []  []  []  []  []      Toilet []  []  []  []  []  []  []  []  []  []  []       []  []  []  []  []  []   []  []  []  []  []     I=Independent, Mod I=Modified Independent, S=Supervision/Setup, SBA=Standby Assistance, CGA=Contact Guard Assistance, Min=Minimal Assistance, Mod=Moderate Assistance, Max=Maximal Assistance, Total=Total Assistance, NT=Not Tested    ACTIVITIES OF DAILY LIVING: I Mod I S SBA CGA Min Mod Max Total NT Comments   BASIC ADLs:              Upper Body Bathing  []  []  []  []  []  []  []  []  []  [x]      Lower Body Bathing []  []  []  []  []  []  []  []  []  [x]      Toileting []  []  []  []  []  []  []  []  []  [x]     Upper Body Dressing []  []  []  []  []  []  []  []  []  [x]     Lower Body Dressing []  []  []  []  []  []  []  []  []  [x]     Feeding []  []  []  [x]  []  []  []  []  []  []     Grooming []  []  []  [x]  []  []  []  []  []  []     Personal Device Care []  []  []  []  []  []  []  []  []  []     Functional Mobility []  []  []  []  [x]  []  []  []  []  []   With ROLLING WALKER    I=Independent, Mod I=Modified Independent, S=Supervision/Setup, SBA=Standby Assistance, CGA=Contact Guard Assistance, Min=Minimal Assistance, Mod=Moderate Assistance, Max=Maximal Assistance, Total=Total Assistance, NT=Not Tested    PLAN:   FREQUENCY/DURATION     for duration of hospital stay or until stated goals are met, whichever comes first.  PROBLEM LIST:   (Skilled intervention is medically necessary to address:)  Decreased ADL/Functional Activities  Decreased Activity Tolerance  Decreased Balance  Decreased Gait Ability  Decreased Safety Awareness  Decreased Strength  Decreased Transfer Abilities  Increased Pain   INTERVENTIONS PLANNED:  (Benefits and precautions of occupational therapy have been discussed with the patient.)  Self Care Training  Therapeutic Activity  Therapeutic Exercise/HEP  Neuromuscular Re-education  Manual Therapy  Education         TREATMENT:     TREATMENT:   SELF CARE: (20 minutes):   Procedure(s) (per grid) utilized to improve and/or restore self-care/home management as related to  functional mobility  . Required mod visual, verbal, manual, and tactile cueing to facilitate  activities of daily living skills, compensatory activities, and OT poc, discharge plan,  and feeding safety .      TREATMENT GRID:  N/A    AFTER TREATMENT PRECAUTIONS: Alarm Activated, Bed/Chair Locked, Call light within reach, Chair, Needs within reach, and RN notified    INTERDISCIPLINARY COLLABORATION:  RN/ PCT and OT/ COTA    EDUCATION:  Education Given To: Patient  Education Provided: Role of Therapy;Plan of Art therapist;Fall Prevention Strategies;Equipment  Education Method: Verbal;Demonstration  Barriers to Learning: Hearing  Education Outcome: Verbalized understanding;Demonstrated understanding;Continued education needed    TOTAL TREATMENT DURATION AND TIME:  Time In: 1120  Time Out: 1140  Minutes: 20    Hilario Quarry, OT

## 2023-07-13 NOTE — Progress Notes (Addendum)
Hospitalist Progress Note   Admit Date:  07/04/2023 11:46 AM   Name:  Paul Medina   Age:  89 y.o.  Sex:  male  DOB:  [birth year]   MRN:  [id number]   Room:  357/01    Presenting/Chief Complaint: Shortness of Breath     Reason(s) for Admission: Severe sepsis (HCC) [A41.9, R65.20]  Pneumonia of right lung due to infectious organism, unspecified part of lung [J18.9]  Sepsis, due to unspecified organism, unspecified whether acute organ dysfunction present [facility]) [A41.9]     Hospital Course:   Paul Medina is a 89 y.o. male with chronic combined heart failure, PAF on Eliquis, HTN, CAD, history of TIA, left eye blindness, OSA on CPAP, and CKD stage III admitted on 2/11 due to severe sepsis and acute respiratory failure from bilateral pneumonia consistent with aspiration and hospital-acquired pneumonia.  He also had recently been diagnosed with COVID and treated.  Blood cultures NGTD  UA unremarkable. 2/11 CT chest with worsening right sided infiltrates and improving left sided infiltrates.      Subjective & 24hr Events:   He was laying comfortably on the bed.  He is very hard of hearing.  Marland KitchenHe was having diarrhea.  He saturating well on room air.  Denies chest pain.    Denies fevers.  Denies N/V.    Assessment & Plan:       Severe sepsis (HCC)    Bilateral pneumonia    Aspiration pneumonia (HCC)  Afebrile, leukocytosis normalized  Currently patient is saturating well on room air   Continue Augmentin 500 mg twice daily -EOT 2/20  Continue azithromycin 250 mg daily x 5 days-EOT 2/19        Acute respiratory failure with hypoxia  Continue Brovana 15 mcg twice daily, DuoNeb 4 times daily, Mucinex 600 mg twice daily  Repeat chest x-ray showed pneumonia      HTN (hypertension)  Blood pressure stable  Continue Coreg 6.25 mg twice daily      Stage 3 chronic kidney disease (HCC)  Creatinine slowly improving and baseline is 1.4  Continue to hold Lasix  Check BMP daily      COVID  Patient diagnosed on 2/5  2/16 CRP 6.8 =>  not a candidate for baricitinib  Continue Decadron       Chronic combined systolic and diastolic heart failure (HCC)  Euvolemic  Continue to hold Lasix      Paroxysmal atrial fibrillation (HCC)  Continue Coreg 6.25 mg twice daily  Continue Eliquis 2.5 mg twice daily      Coronary atherosclerosis of native coronary artery      History of TIA (transient ischemic attack)      OSA (obstructive sleep apnea)  Continue CPAP at night      History of B-cell lymphoma      DNR (do not resuscitate)    Diarrhea  Follow-up with stool studies    Dispo anticipate hospital stay for 1 to 2 days.      Anticipated Discharge Arrangements:   Home Health    PT/OT evals ordered?  Therapy evals ordered  Diet:  ADULT ORAL NUTRITION SUPPLEMENT; Breakfast, Lunch, Dinner; Educational psychologist Protein Oral Supplement  ADULT DIET; Dysphagia - Minced and Moist; Mildly Thick (Nectar); 1500 ml; No Drinking Straws  VTE prophylaxis: Already on anticoagulation  Code status: DNR      Non-peripheral Lines and Tubes (if present):          Telemetry (if present):  Cardiac/Telemetry Monitor  On: No        Hospital Problems:  Principal Problem:    Severe sepsis (HCC)  Active Problems:    Chronic combined systolic and diastolic heart failure (HCC)    HTN (hypertension)    HLD (hyperlipidemia)    Coronary atherosclerosis of native coronary artery    History of TIA (transient ischemic attack)    OSA (obstructive sleep apnea)    Stage 3 chronic kidney disease (HCC)    COVID    Atrial fibrillation (HCC)    Bilateral pneumonia    Acute respiratory failure with hypoxia (HCC)    Aspiration pneumonia (HCC)    History of B-cell lymphoma    DNR (do not resuscitate)  Resolved Problems:    * No resolved hospital problems. *      Objective:   Patient Vitals for the past 24 hrs:   Temp Pulse Resp BP SpO2   07/13/23 0726 -- 67 16 -- 97 %   07/13/23 0700 97.7 F (36.5 C) 66 18 (!) 145/75 96 %   07/12/23 1922 97.5 F (36.4 C) 74 20 116/63 94 %       Oxygen  Therapy  SpO2: 97 %  Pulse via Oximetry: 83 beats per minute  Pulse Oximeter Device Mode: Intermittent  Pulse Oximeter Device Location: Finger  O2 Device: None (Room air)  Skin Assessment: Clean, dry, & intact  FiO2 : 32 %  O2 Flow Rate (L/min): 0 L/min    Estimated body mass index is 29.05 kg/m as calculated from the following:    Height as of this encounter: 1.676 m (5\' 6" ).    Weight as of this encounter: 81.6 kg (180 lb).    Intake/Output Summary (Last 24 hours) at 07/13/2023 1147  Last data filed at 07/13/2023 1610  Gross per 24 hour   Intake 600 ml   Output 675 ml   Net -75 ml         Physical Exam:   General:    Appears worn out and frail  Head:  Normocephalic, atraumatic  Eyes:  Sclerae appear normal.  Pupils equally round.  ENT:  Nares appear normal.  Moist oral mucosa  Neck:  No restricted ROM.  Trachea midline   CV:   RRR.  No m/r/g.  No jugular venous distension.  Lungs:   Bilateral scattered rhonchi with coarse wheezing.  Symmetric expansion.Abdomen:   Soft, nontender, nondistended.  Extremities: No cyanosis or clubbing.  No edema  Skin:     No rashes.  Normal coloration.   Warm and dry.    Neuro:  CN II-XII grossly intact.    Psych:  Depressed affect    Imaging:    Chest X-Ray 2/11 personally reviewed and interpreted by me        I have personally reviewed labs and tests:  Recent Labs:  Recent Results (from the past 48 hour(s))   CBC    Collection Time: 07/12/23  6:32 AM   Result Value Ref Range    WBC 13.7 (H) 4.3 - 11.1 K/uL    RBC 2.90 (L) 4.23 - 5.6 M/uL    Hemoglobin 8.3 (L) 13.6 - 17.2 g/dL    Hematocrit 96.0 (L) 41.1 - 50.3 %    MCV 87.2 82.0 - 102.0 FL    MCH 28.6 26.1 - 32.9 PG    MCHC 32.8 31.4 - 35.0 g/dL    RDW 45.4 (H) 09.8 - 14.6 %    Platelets 92 (L) 150 -  450 K/uL    MPV Unable to calculate. Recommend adding IPF. 9.4 - 12.3 FL    nRBC 0.00 0.0 - 0.2 K/uL   Basic Metabolic Panel w/ Reflex to MG    Collection Time: 07/12/23  6:32 AM   Result Value Ref Range    Sodium 141 136 - 145 mmol/L     Potassium 4.3 3.5 - 5.1 mmol/L    Chloride 106 98 - 107 mmol/L    CO2 21 20 - 29 mmol/L    Anion Gap 14 7 - 16 mmol/L    Glucose 157 (H) 70 - 99 mg/dL    BUN 65 (H) 8 - 23 MG/DL    Creatinine 1.30 (H) 0.80 - 1.30 MG/DL    Est, Glom Filt Rate 34 (L) >60 ml/min/1.54m2    Calcium 8.5 (L) 8.8 - 10.2 MG/DL   Magnesium    Collection Time: 07/12/23  6:32 AM   Result Value Ref Range    Magnesium 2.4 1.8 - 2.4 mg/dL   Phosphorus    Collection Time: 07/12/23  6:32 AM   Result Value Ref Range    Phosphorus 3.7 2.5 - 4.5 MG/DL   Magnesium    Collection Time: 07/13/23  8:48 AM   Result Value Ref Range    Magnesium 2.4 1.8 - 2.4 mg/dL   Phosphorus    Collection Time: 07/13/23  8:48 AM   Result Value Ref Range    Phosphorus 3.1 2.5 - 4.5 MG/DL   CBC with Auto Differential    Collection Time: 07/13/23  8:48 AM   Result Value Ref Range    WBC 10.7 4.3 - 11.1 K/uL    RBC 2.99 (L) 4.23 - 5.6 M/uL    Hemoglobin 8.6 (L) 13.6 - 17.2 g/dL    Hematocrit 86.5 (L) 41.1 - 50.3 %    MCV 85.6 82.0 - 102.0 FL    MCH 28.8 26.1 - 32.9 PG    MCHC 33.6 31.4 - 35.0 g/dL    RDW 78.4 (H) 69.6 - 14.6 %    Platelets 91 (L) 150 - 450 K/uL    MPV Unable to calculate. Recommend adding IPF. 9.4 - 12.3 FL    nRBC 0.00 0.0 - 0.2 K/uL    Differential Type AUTOMATED      Neutrophils % 90.3 (H) 43.0 - 78.0 %    Lymphocytes % 3.4 (L) 13.0 - 44.0 %    Monocytes % 3.1 (L) 4.0 - 12.0 %    Eosinophils % 0.0 (L) 0.5 - 7.8 %    Basophils % 0.6 0.0 - 2.0 %    Immature Granulocytes % 2.6 0.0 - 5.0 %    Neutrophils Absolute 9.71 (H) 1.70 - 8.20 K/UL    Lymphocytes Absolute 0.36 (L) 0.50 - 4.60 K/UL    Monocytes Absolute 0.33 0.10 - 1.30 K/UL    Eosinophils Absolute 0.00 0.00 - 0.80 K/UL    Basophils Absolute 0.06 0.00 - 0.20 K/UL    Immature Granulocytes Absolute 0.28 0.0 - 0.5 K/UL   Basic Metabolic Panel w/ Reflex to MG    Collection Time: 07/13/23  8:48 AM   Result Value Ref Range    Sodium 137 136 - 145 mmol/L    Potassium 4.4 3.5 - 5.1 mmol/L    Chloride 101 98 -  107 mmol/L    CO2 22 20 - 29 mmol/L    Anion Gap 14 7 - 16 mmol/L    Glucose 154 (H) 70 -  99 mg/dL    BUN 56 (H) 8 - 23 MG/DL    Creatinine 1.61 (H) 0.80 - 1.30 MG/DL    Est, Glom Filt Rate 42 (L) >60 ml/min/1.56m2    Calcium 8.8 8.8 - 10.2 MG/DL       No results for input(s): "COVID19" in the last 72 hours.    Current Meds:  Current Facility-Administered Medications   Medication Dose Route Frequency    [START ON 07/14/2023] lactobacillus (CULTURELLE) capsule 1 capsule  1 capsule Oral Daily with breakfast    medicated lip ointment (BLISTEX)   Topical PRN    albuterol (PROVENTIL) (2.5 MG/3ML) 0.083% nebulizer solution 2.5 mg  2.5 mg Nebulization Q4H PRN    dexAMETHasone (DECADRON) tablet 6 mg  6 mg Oral Daily    amoxicillin-clavulanate (AUGMENTIN) 500-125 MG per tablet 1 tablet  1 tablet Oral 2 times per day    ipratropium 0.5 mg-albuterol 2.5 mg (DUONEB) nebulizer solution 1 Dose  1 Dose Inhalation BID RT    nystatin (MYCOSTATIN) 100000 UNIT/ML suspension 500,000 Units  5 mL Oral 4x Daily    apixaban (ELIQUIS) tablet 2.5 mg  2.5 mg Oral 2 times per day    rosuvastatin (CRESTOR) tablet 10 mg  10 mg Oral Nightly    sodium chloride (Inhalant) 3 % nebulizer solution 4 mL  4 mL Nebulization PRN    arformoterol tartrate (BROVANA) nebulizer solution 15 mcg  15 mcg Nebulization BID RT    prednisoLONE acetate (PRED FORTE) 1 % ophthalmic suspension 1 drop  1 drop Left Eye 6 times per day    atropine 1 % ophthalmic solution 1 drop  1 drop Left Eye Nightly    brimonidine (ALPHAGAN) 0.2 % ophthalmic solution 1 drop  1 drop Left Eye TID    sodium chloride flush 0.9 % injection 5-40 mL  5-40 mL IntraVENous 2 times per day    sodium chloride flush 0.9 % injection 5-40 mL  5-40 mL IntraVENous PRN    0.9 % sodium chloride infusion   IntraVENous PRN    potassium chloride (KLOR-CON M) extended release tablet 40 mEq  40 mEq Oral PRN    Or    potassium bicarb-citric acid (EFFER-K) effervescent tablet 40 mEq  40 mEq Oral PRN    Or     potassium chloride 10 mEq/100 mL IVPB (Peripheral Line)  10 mEq IntraVENous PRN    magnesium sulfate 2000 mg in 50 mL IVPB premix  2,000 mg IntraVENous PRN    ondansetron (ZOFRAN-ODT) disintegrating tablet 4 mg  4 mg Oral Q8H PRN    Or    ondansetron (ZOFRAN) injection 4 mg  4 mg IntraVENous Q6H PRN    polyethylene glycol (GLYCOLAX) packet 17 g  17 g Oral Daily PRN    acetaminophen (TYLENOL) tablet 650 mg  650 mg Oral Q6H PRN    Or    acetaminophen (TYLENOL) suppository 650 mg  650 mg Rectal Q6H PRN    guaiFENesin (MUCINEX) extended release tablet 600 mg  600 mg Oral BID    carvedilol (COREG) tablet 6.25 mg  6.25 mg Oral BID WC    sodium bicarbonate tablet 650 mg  650 mg Oral 4x Daily    doxazosin (CARDURA) tablet 4 mg  4 mg Oral Daily    pantoprazole (PROTONIX) tablet 40 mg  40 mg Oral QAM AC    [Held by provider] furosemide (LASIX) tablet 20 mg  20 mg Oral Daily       Signed:  Verdis Prime, MD    Part of this note may have been written by using a voice dictation software.  The note has been proof read but may still contain some grammatical/other typographical errors.

## 2023-07-13 NOTE — Plan of Care (Signed)
Problem: Respiratory - Adult  Goal: Achieves optimal ventilation and oxygenation  07/13/2023 2137 by Cecile Hearing, RCP  Outcome: Progressing  Flowsheets (Taken 07/13/2023 2137)  Achieves optimal ventilation and oxygenation:   Assess for changes in respiratory status   Position to facilitate oxygenation and minimize respiratory effort   Initiate smoking cessation protocol as indicated   Assess the need for suctioning and aspirate as needed   Respiratory therapy support as indicated   Assess for changes in mentation and behavior   Oxygen supplementation based on oxygen saturation or arterial blood gases   Encourage broncho-pulmonary hygiene including cough, deep breathe, incentive spirometry   Assess and instruct to report shortness of breath or any respiratory difficulty  07/13/2023 0752 by Elmon Kirschner, RCP  Outcome: Progressing

## 2023-07-13 NOTE — Plan of Care (Signed)
Problem: Discharge Planning  Goal: Discharge to home or other facility with appropriate resources  Outcome: Progressing     Problem: Safety - Adult  Goal: Free from fall injury  Outcome: Progressing     Problem: ABCDS Injury Assessment  Goal: Absence of physical injury  Outcome: Progressing     Problem: Respiratory - Adult  Goal: Achieves optimal ventilation and oxygenation  07/13/2023 2212 by Donell Sievert I, RN  Outcome: Progressing  07/13/2023 2137 by Cecile Hearing, RCP  Outcome: Progressing  Flowsheets (Taken 07/13/2023 2137)  Achieves optimal ventilation and oxygenation:   Assess for changes in respiratory status   Position to facilitate oxygenation and minimize respiratory effort   Initiate smoking cessation protocol as indicated   Assess the need for suctioning and aspirate as needed   Respiratory therapy support as indicated   Assess for changes in mentation and behavior   Oxygen supplementation based on oxygen saturation or arterial blood gases   Encourage broncho-pulmonary hygiene including cough, deep breathe, incentive spirometry   Assess and instruct to report shortness of breath or any respiratory difficulty     Problem: Skin/Tissue Integrity  Goal: Skin integrity remains intact  Description: 1.  Monitor for areas of redness and/or skin breakdown  2.  Assess vascular access sites hourly  3.  Every 4-6 hours minimum:  Change oxygen saturation probe site  4.  Every 4-6 hours:  If on nasal continuous positive airway pressure, respiratory therapy assess nares and determine need for appliance change or resting period  Outcome: Progressing

## 2023-07-14 LAB — CBC WITH AUTO DIFFERENTIAL
Basophils %: 0.9 % (ref 0.0–2.0)
Basophils Absolute: 0.09 10*3/uL (ref 0.00–0.20)
Eosinophils %: 0 % — ABNORMAL LOW (ref 0.5–7.8)
Eosinophils Absolute: 0 10*3/uL (ref 0.00–0.80)
Hematocrit: 26.3 % — ABNORMAL LOW (ref 41.1–50.3)
Hemoglobin: 8.9 g/dL — ABNORMAL LOW (ref 13.6–17.2)
Immature Granulocytes %: 5.1 % — ABNORMAL HIGH (ref 0.0–5.0)
Immature Granulocytes Absolute: 0.49 10*3/uL (ref 0.0–0.5)
Lymphocytes %: 4 % — ABNORMAL LOW (ref 13.0–44.0)
Lymphocytes Absolute: 0.38 10*3/uL — ABNORMAL LOW (ref 0.50–4.60)
MCH: 28.9 pg (ref 26.1–32.9)
MCHC: 33.8 g/dL (ref 31.4–35.0)
MCV: 85.4 fL (ref 82.0–102.0)
MPV: UNDETERMINED fL (ref 9.4–12.3)
Monocytes %: 5.5 % (ref 4.0–12.0)
Monocytes Absolute: 0.53 10*3/uL (ref 0.10–1.30)
Neutrophils %: 84.5 % — ABNORMAL HIGH (ref 43.0–78.0)
Neutrophils Absolute: 8.1 10*3/uL (ref 1.70–8.20)
Platelets: 139 10*3/uL — ABNORMAL LOW (ref 150–450)
RBC: 3.08 M/uL — ABNORMAL LOW (ref 4.23–5.6)
RDW: 15.2 % — ABNORMAL HIGH (ref 11.9–14.6)
WBC: 9.6 10*3/uL (ref 4.3–11.1)
nRBC: 0 10*3/uL (ref 0.0–0.2)

## 2023-07-14 LAB — BASIC METABOLIC PANEL
Anion Gap: 14 mmol/L (ref 7–16)
BUN: 50 mg/dL — ABNORMAL HIGH (ref 8–23)
CO2: 21 mmol/L (ref 20–29)
Calcium: 8.9 mg/dL (ref 8.8–10.2)
Chloride: 100 mmol/L (ref 98–107)
Creatinine: 1.41 mg/dL — ABNORMAL HIGH (ref 0.80–1.30)
Est, Glom Filt Rate: 47 mL/min/{1.73_m2} — ABNORMAL LOW (ref >60)
Glucose: 118 mg/dL — ABNORMAL HIGH (ref 70–99)
Potassium: 4.7 mmol/L (ref 3.5–5.1)
Sodium: 135 mmol/L — ABNORMAL LOW (ref 136–145)

## 2023-07-14 LAB — MAGNESIUM: Magnesium: 2.3 mg/dL (ref 1.8–2.4)

## 2023-07-14 LAB — PHOSPHORUS: Phosphorus: 3.4 mg/dL (ref 2.5–4.5)

## 2023-07-14 MED ORDER — PREDNISOLONE ACETATE 1 % OP SUSP
1 % | Freq: Every day | OPHTHALMIC | Status: DC
Start: 2023-07-14 — End: 2023-07-21
  Administered 2023-07-14 – 2023-07-21 (×8): 1 [drp] via OPHTHALMIC

## 2023-07-14 MED ORDER — DEXAMETHASONE 6 MG PO TABS
6 | ORAL_TABLET | Freq: Every day | ORAL | 0 refills | Status: AC
Start: 2023-07-14 — End: 2023-07-20

## 2023-07-14 MED ORDER — APIXABAN 5 MG PO TABS
5 MG | Freq: Two times a day (BID) | ORAL | Status: AC
Start: 2023-07-14 — End: 2023-07-21
  Administered 2023-07-14 – 2023-07-21 (×15): 5 mg via ORAL

## 2023-07-14 MED FILL — NYSTATIN 100000 UNIT/ML MT SUSP: 100000 UNIT/ML | OROMUCOSAL | Qty: 5

## 2023-07-14 MED FILL — PANTOPRAZOLE SODIUM 40 MG PO TBEC: 40 MG | ORAL | Qty: 1

## 2023-07-14 MED FILL — DEXAMETHASONE 4 MG PO TABS: 4 MG | ORAL | Qty: 2

## 2023-07-14 MED FILL — CARVEDILOL 6.25 MG PO TABS: 6.25 MG | ORAL | Qty: 1

## 2023-07-14 MED FILL — AMOXICILLIN-POT CLAVULANATE 500-125 MG PO TABS: 500-125 MG | ORAL | Qty: 1

## 2023-07-14 MED FILL — GUAIFENESIN ER 600 MG PO TB12: 600 MG | ORAL | Qty: 1

## 2023-07-14 MED FILL — CULTURELLE PO CAPS: ORAL | Qty: 1

## 2023-07-14 MED FILL — ELIQUIS 5 MG PO TABS: 5 MG | ORAL | Qty: 1

## 2023-07-14 MED FILL — IPRATROPIUM-ALBUTEROL 0.5-2.5 (3) MG/3ML IN SOLN: 0.5-2.533 (3) MG/3ML | RESPIRATORY_TRACT | Qty: 3

## 2023-07-14 MED FILL — ROSUVASTATIN CALCIUM 5 MG PO TABS: 5 MG | ORAL | Qty: 2

## 2023-07-14 MED FILL — DOXAZOSIN MESYLATE 4 MG PO TABS: 4 MG | ORAL | Qty: 1

## 2023-07-14 MED FILL — ARFORMOTEROL TARTRATE 15 MCG/2ML IN NEBU: 152 MCG/2ML | RESPIRATORY_TRACT | Qty: 2

## 2023-07-14 MED FILL — ELIQUIS 2.5 MG PO TABS: 2.5 MG | ORAL | Qty: 1

## 2023-07-14 MED FILL — ALBUTEROL SULFATE (2.5 MG/3ML) 0.083% IN NEBU: RESPIRATORY_TRACT | Qty: 3

## 2023-07-14 NOTE — Plan of Care (Signed)
 Problem: Discharge Planning  Goal: Discharge to home or other facility with appropriate resources  07/14/2023 2258 by Waynetta Pean, RN  Outcome: Progressing  Flowsheets (Taken 07/14/2023 2149)  Discharge to home or other facility with appropriate resources: Identify discharge learning needs (meds, wound care, etc)  07/14/2023 0923 by Carola Frost, RN  Outcome: Progressing     Problem: Safety - Adult  Goal: Free from fall injury  07/14/2023 2258 by Waynetta Pean, RN  Outcome: Progressing  07/14/2023 0923 by Carola Frost, RN  Outcome: Progressing     Problem: ABCDS Injury Assessment  Goal: Absence of physical injury  07/14/2023 2258 by Waynetta Pean, RN  Outcome: Progressing  07/14/2023 0923 by Carola Frost, RN  Outcome: Progressing     Problem: Respiratory - Adult  Goal: Achieves optimal ventilation and oxygenation  07/14/2023 2258 by Waynetta Pean, RN  Outcome: Progressing  07/14/2023 0923 by Carola Frost, RN  Outcome: Progressing     Problem: Skin/Tissue Integrity  Goal: Skin integrity remains intact  Description: 1.  Monitor for areas of redness and/or skin breakdown  2.  Assess vascular access sites hourly  3.  Every 4-6 hours minimum:  Change oxygen saturation probe site  4.  Every 4-6 hours:  If on nasal continuous positive airway pressure, respiratory therapy assess nares and determine need for appliance change or resting period  07/14/2023 2258 by Waynetta Pean, RN  Outcome: Progressing  07/14/2023 0923 by Carola Frost, RN  Outcome: Progressing

## 2023-07-14 NOTE — Discharge Summary (Addendum)
 Hospitalist Discharge Summary   Admit Date:  07/04/2023 11:46 AM   DC Note date: 07/14/2023  Name:  Paul Medina   Age:  89 y.o.  Sex:  male  DOB:  11-30-1930   MRN:  [id number]   Room:  357/01  PCP:  Unknown, Provider, ANP    Presenting Complaint: Shortness of Breath     Initial Admission Diagnosis: Severe sepsis (HCC) [A41.9, R65.20]  Pneumonia of right lung due to infectious organism, unspecified part of lung [J18.9]  Sepsis, due to unspecified organism, unspecified whether acute organ dysfunction present (HCC) [A41.9]     Problem List for this Hospitalization (present on admission):    Principal Problem:    Severe sepsis (HCC)  Active Problems:    Chronic combined systolic and diastolic heart failure (HCC)    HTN (hypertension)    HLD (hyperlipidemia)    Coronary atherosclerosis of native coronary artery    History of TIA (transient ischemic attack)    OSA (obstructive sleep apnea)    Stage 3 chronic kidney disease (HCC)    COVID    Atrial fibrillation (HCC)    Bilateral pneumonia    Acute respiratory failure with hypoxia (HCC)    Aspiration pneumonia (HCC)    History of B-cell lymphoma    DNR (do not resuscitate)  Resolved Problems:    * No resolved hospital problems. *      Hospital Course:  Paul Medina is a 89 y.o. male with chronic combined heart failure, PAF on Eliquis, HTN, CAD, history of TIA, left eye blindness, OSA on CPAP, and CKD stage III admitted on 2/11 due to severe sepsis and acute respiratory failure from bilateral pneumonia consistent with aspiration and hospital-acquired pneumonia.  He also had recently been diagnosed with COVID and treated.  Blood cultures NGTD. UA unremarkable.  His kidney function was worsening and Lasix, Losartan was on hold.  His creatinine improved.  2/11 CT chest with worsening right sided infiltrates and improving left sided infiltrates. He was Afebrile, Leukocytosis normalized. He completed antibiotics.  He was treated with Decadron.  Patient is saturating well  on 2 LNC. Repeat chest x-ray showed pneumonia.  RT was consulted.  Patient is hemodynamically stable for discharge.       Disposition: Home with Home Health  Diet: ADULT ORAL NUTRITION SUPPLEMENT; Breakfast, Lunch, Dinner; Educational psychologist Protein Oral Supplement  ADULT DIET; Dysphagia - Minced and Moist; Mildly Thick (Nectar); 1500 ml; No Drinking Straws  Code Status: DNR    Follow Ups:  Follow-up Information    None         Future Appointments         Provider Specialty Dept Phone    11/02/2023 2:45 PM (Arrive by 2:30 PM) Paul Lick, APRN - CNP Pulmonology [phone]              Follow up labs/diagnostics (ultimately defer to outpatient provider):  Follow-up with PCP in 3 to 5 days    Plan was discussed with Patient.  All questions answered.  Patient was stable at time of discharge.  Instructions given to call a physician or return if any concerns.    Pending Labs       Order Current Status    C. difficile toxin Molecular Collected (07/13/23 1200)    Ova and Parasite Examination In process    Culture, Stool Preliminary result            Current Discharge Medication List  CONTINUE these medications which have NOT CHANGED    Details   apixaban (ELIQUIS) 2.5 MG TABS tablet Take 1 tablet by mouth 2 times daily  Qty: 60 tablet, Refills: 0      carvedilol (COREG) 6.25 MG tablet Take 1 tablet by mouth 2 times daily (with meals)  Qty: 60 tablet, Refills: 0      lidocaine (LIDODERM) 5 % Place 1 patch onto the skin daily      losartan (COZAAR) 25 MG tablet 1 tablet daily      Multiple Vitamin (MULTIVITAMIN) tablet Take 1 tablet by mouth daily      pantoprazole (PROTONIX) 40 MG tablet Take 1 tablet by mouth daily      prednisoLONE acetate (PRED FORTE) 1 % ophthalmic suspension 1 drop 2 times daily      tiotropium-olodaterol (STIOLTO) 2.5-2.5 MCG/ACT AERS Inhale 2 puffs into the lungs daily  Qty: 1 each, Refills: 11      albuterol sulfate HFA (PROVENTIL;VENTOLIN;PROAIR) 108 (90 Base) MCG/ACT inhaler  Inhale 2 puffs into the lungs every 4 hours as needed for Shortness of Breath  Qty: 1 each, Refills: 11      spironolactone (ALDACTONE) 25 MG tablet Take 1 tablet by mouth daily  Qty: 30 tablet, Refills: 3      empagliflozin (JARDIANCE) 10 MG tablet Take 1 tablet by mouth daily  Qty: 30 tablet, Refills: 0      furosemide (LASIX) 20 MG tablet Take 1 tablet by mouth daily  Qty: 60 tablet, Refills: 3      ascorbic acid (VITAMIN C) 500 MG tablet Take 1 tablet by mouth daily      lactobacillus (CULTURELLE) CAPS capsule Take 1 capsule by mouth daily      loratadine (CLARITIN) 10 MG tablet Take 1 tablet by mouth daily      rosuvastatin (CRESTOR) 10 MG tablet 3 tablets daily      tamsulosin (FLOMAX) 0.4 MG capsule Take 1 capsule by mouth daily      sildenafil (VIAGRA) 100 MG tablet Take 1 tablet by mouth as needed      terazosin (HYTRIN) 5 MG capsule 2 capsules daily           STOP taking these medications       predniSONE (DELTASONE) 20 MG tablet Comments:   Reason for Stopping:         sodium bicarbonate 650 MG tablet Comments:   Reason for Stopping:         cefpodoxime (VANTIN) 200 MG tablet Comments:   Reason for Stopping:               Some of the medications may be marked as "stop taking" by the system; but in reality patient or family reported already being off these meds; defer to outpatient/prescribing providers.      Procedures done this admission:  * No surgery found *    Consults this admission:  IP CONSULT TO RESPIRATORY CARE  IP CONSULT HOME HEALTH    Consultants will arrange their own follow ups, if applicable.    Echocardiogram results:  06/28/23    ECHO (TTE) COMPLETE (PRN CONTRAST/BUBBLE/STRAIN/3D) 06/29/2023  3:43 PM (Final)    Interpretation Summary    Left Ventricle: Moderately reduced left ventricular systolic function with a visually estimated EF of 30 - 35%. Left ventricle is moderately dilated. Normal wall thickness. Grade II diastolic dysfunction with increased LAP.    Aortic Valve: Mild  regurgitation. Mild stenosis of the aortic valve (  DI ~0.44).    Mitral Valve: Mild to moderate regurgitation with a posterior directed jet.    Left Atrium: Left atrium is mildly dilated.    Image quality is adequate. Contrast used: Lumason.    Signed by: Lenna Gilford, MD on 06/29/2023  3:43 PM      Diagnostic Imaging/Tests:   XR CHEST PORTABLE    Result Date: 07/12/2023  There are patchy bilateral airspace opacities most prominent in the lower lobes, with somewhat more consolidative appearance in the left lower lobe. *  Heart is normal size *  Pulmonary vascularity is normal *  Mediastinum and bony thorax unremarkable age *  There is no pneumothorax Electronically signed by Charyl Dancer    FL MODIFIED BARIUM SWALLOW W VIDEO    Result Date: 07/06/2023  Please refer to the final report from the speech pathologist. Study began with thin consistency, which the patient tolerated well. During the study a prominent cricopharyngeus was noted. With nectar consistency, prominent vallecular residue was seen, which cleared with swallowing thin consistency. With nectar consistency flash penetration was also seen, with question of aspiration. Patient tolerated the procedure well without immediate complication Electronically signed by Charyl Dancer    XR FOOT LEFT (2 VIEWS)    Result Date: 07/05/2023     No fractures. Hammertoe deformities Electronically signed by Charyl Dancer    CT CHEST PULMONARY EMBOLISM W CONTRAST    Result Date: 07/04/2023  1.  No pulmonary embolus identified. No thoracic aortic aneurysm/dissection. 2.  Compared to 5 days ago, overall similar appearing basilar predominant pneumonia. Pneumonia has slightly worsened in the right lower lobe and right upper lobe. Pneumonia slightly improved in the left lower lobe. Worsening mucous plugging in the lower lobes raising possibility of aspiration pneumonia/pneumonitis. Resolved right pleural effusion and unchanged trace left pleural effusion. Electronically signed by  Elsie Stain    XR CHEST 1 VIEW    Result Date: 07/04/2023      Silhouetting of the left hemidiaphragm which may represent a left lower lobe pneumonia : On the prior chest CT., The patient had a left basilar airspace process, which was not well appreciated on today's study Electronically signed by Charyl Dancer    CT CHEST PULMONARY EMBOLISM W CONTRAST    Result Date: 06/29/2023  1.  No pulmonary embolism. No thoracic aortic aneurysm/dissection. 2.  Bibasilar pneumonia, greater on the left with trace parapneumonic effusions. 3.  No definite thoracic metastatic disease. 4.  Stable left adrenal nodule. Electronically signed by Elsie Stain    XR CHEST PORTABLE    Result Date: 06/28/2023  Findings/impression: Questionable ill-defined opacity of the retrocardiac region without other acute cardiopulmonary finding. Dedicated PA/lateral chest would be useful for further evaluation. Electronically signed by Gifford Shave    CT Head W/O Contrast    Result Date: 06/20/2023  No CT evidence of acute intracranial abnormality. Electronically signed by Lala Lund    XR CHEST PORTABLE    Result Date: 06/20/2023  No acute findings in the chest Electronically signed by Sandi Mealy       Labs: Results:       BMP, Mg, Phos Recent Labs     07/12/23  0632 07/13/23  0848 07/14/23  0629   NA 141 137 135*   K 4.3 4.4 4.7   CL 106 101 100   CO2 21 22 21    ANIONGAP 14 14 14    BUN 65* 56* 50*   CREATININE 1.86*  1.55* 1.41*   LABGLOM 34* 42* 47*   CALCIUM 8.5* 8.8 8.9   GLUCOSE 157* 154* 118*   MG 2.4 2.4 2.3   PHOS 3.7 3.1 3.4      CBC Recent Labs     07/12/23  0632 07/13/23  0848 07/14/23  0629   WBC 13.7* 10.7 9.6   RBC 2.90* 2.99* 3.08*   HGB 8.3* 8.6* 8.9*   HCT 25.3* 25.6* 26.3*   MCV 87.2 85.6 85.4   MCH 28.6 28.8 28.9   MCHC 32.8 33.6 33.8   RDW 15.4* 15.2* 15.2*   PLT 92* 91* 139*   MPV Unable to calculate. Recommend adding IPF. Unable to calculate. Recommend adding IPF. Unable to calculate. Recommend adding IPF.   NRBC 0.00 0.00 0.00    LYMPHOPCT  --  3.4* 4.0*   MONOPCT  --  3.1* 5.5   BASOPCT  --  0.6 0.9   IMMGRAN  --  2.6 5.1*   LYMPHSABS  --  0.36* 0.38*   EOSABS  --  0.00 0.00   MONOSABS  --  0.33 0.53   BASOSABS  --  0.06 0.09   ABSIMMGRAN  --  0.28 0.49      LFT No results for input(s): "BILITOT", "BILIDIR", "ALKPHOS", "AST", "ALT", "PROTEIN", "ALBUMIN", "GLOB" in the last 72 hours.   Cardiac  No results found for: "NTPROBNP", "TROPHS"   Coags Lab Results   Component Value Date/Time    PROTIME 15.1 06/29/2023 10:15 AM    INR 1.2 06/29/2023 10:15 AM    APTT 33.9 06/29/2023 10:15 AM      A1c No results found for: "LABA1C", "EAG"   Lipids No results found for: "CHOL", "HDL", "CHOLHDLRATIO", "TRIG"   Thyroid  No results found for: "TSHELE", "TSH3GEN"     Most Recent UA Lab Results   Component Value Date/Time    COLORU YELLOW/STRAW 07/13/2023 01:22 PM    APPEARANCE CLEAR 07/13/2023 01:22 PM    PROTEINU TRACE 07/13/2023 01:22 PM    GLUCOSEU Negative 07/13/2023 01:22 PM    KETUA Negative 07/13/2023 01:22 PM    BILIRUBINUR Negative 07/13/2023 01:22 PM    BLOODU Negative 07/13/2023 01:22 PM    UROBILINOGEN 0.2 07/13/2023 01:22 PM    NITRU Negative 07/13/2023 01:22 PM    LEUKOCYTESUR TRACE 07/13/2023 01:22 PM    WBCUA 0-3 07/13/2023 01:22 PM    RBCUA 0-3 07/13/2023 01:22 PM    BACTERIA 0 07/13/2023 01:22 PM    LABCAST HYALINE 07/13/2023 01:22 PM    MUCUS 0 06/29/2023 04:50 AM        Microbiology:  Results       Procedure Component Value Units Date/Time    C. difficile toxin Molecular [[id number]] Collected: 07/13/23 1200    Order Status: No result     C. difficile toxin Molecular [[id number]] Collected: 07/13/23 1145    Order Status: Canceled Specimen: Stool     Culture, Stool [[id number]] Collected: 07/13/23 1145    Order Status: Completed Specimen: Stool Updated: 07/14/23 0934     Special Requests NO SPECIAL REQUESTS        Culture       NO GROWTH OF SALMONELLA OR SHIGELLA IS EVIDENT ON FIRST READING, FINAL REPORT TO FOLLOW AFTER FURTHER  INCUBATION OF CULTURE          Ova and Parasite Examination [[id number]] Collected: 07/13/23 1145    Order Status: Sent Specimen: Stool Updated: 07/13/23 1151    C. difficile toxin Molecular [  [id number]] Collected: 07/09/23 1359    Order Status: Canceled Specimen: Stool     MRSA/Staph Aureus DNA [[id number]] Collected: 07/04/23 2316    Order Status: Completed Specimen: Nasal Swab Updated: 07/05/23 1321     MRSA by PCR Not detected        Comment: A positive test result does not necessarily indicate the presence of a viable organism. A positive result is indicative of the presence of SA or MRSA DNA.  The BD Max StaphSR assay is intended to aid in the prevention and control of MRSA and SA infections in healthcare settings.  It is not intended to diagnose MRSA or SA infections nor guide or monitor treatment for MRSA/SA infections. A negative result does not preclude nasal colonization.          SA by PCR Not detected       Culture, Blood 1 [[id number]] Collected: 07/04/23 1309    Order Status: Completed Specimen: Blood Updated: 07/09/23 0755     Special Requests --        RIGHT  Antecubital       Culture NO GROWTH 5 DAYS       Culture, Blood 2 [[id number]] Collected: 07/04/23 1309    Order Status: Completed Specimen: Blood Updated: 07/09/23 0755     Special Requests --        LEFT  FOREARM       Culture NO GROWTH 5 DAYS               All Labs from Last 24 Hrs:  Recent Results (from the past 24 hour(s))   Culture, Stool    Collection Time: 07/13/23 11:45 AM    Specimen: Stool   Result Value Ref Range    Special Requests NO SPECIAL REQUESTS      Culture        NO GROWTH OF SALMONELLA OR SHIGELLA IS EVIDENT ON FIRST READING, FINAL REPORT TO FOLLOW AFTER FURTHER INCUBATION OF CULTURE   Occult Blood, Fecal    Collection Time: 07/13/23 11:45 AM   Result Value Ref Range    POC Occult Blood, Fecal Negative NEG     Urinalysis w rflx microscopic    Collection Time: 07/13/23  1:22 PM   Result Value Ref Range    Color, UA  YELLOW/STRAW      Appearance CLEAR      Specific Gravity, UA 1.017 1.001 - 1.023      pH, Urine 5.0 5.0 - 9.0      Protein, UA TRACE (A) NEG mg/dL    Glucose, Ur Negative NEG mg/dL    Ketones, Urine Negative NEG mg/dL    Bilirubin, Urine Negative NEG      Blood, Urine Negative NEG      Urobilinogen, Urine 0.2 0.2 - 1.0 EU/dL    Nitrite, Urine Negative NEG      Leukocyte Esterase, Urine TRACE (A) NEG      WBC, UA 0-3 0 /hpf    RBC, UA 0-3 0 /hpf    Epithelial Cells, UA 0-3 0 /hpf    BACTERIA, URINE 0 0 /hpf    Casts HYALINE 0 /lpf    Crystals URIC ACID CRYSTALS 0 /LPF   Magnesium    Collection Time: 07/14/23  6:29 AM   Result Value Ref Range    Magnesium 2.3 1.8 - 2.4 mg/dL   Phosphorus    Collection Time: 07/14/23  6:29 AM   Result Value Ref Range    Phosphorus  3.4 2.5 - 4.5 MG/DL   CBC with Auto Differential    Collection Time: 07/14/23  6:29 AM   Result Value Ref Range    WBC 9.6 4.3 - 11.1 K/uL    RBC 3.08 (L) 4.23 - 5.6 M/uL    Hemoglobin 8.9 (L) 13.6 - 17.2 g/dL    Hematocrit 44.0 (L) 41.1 - 50.3 %    MCV 85.4 82.0 - 102.0 FL    MCH 28.9 26.1 - 32.9 PG    MCHC 33.8 31.4 - 35.0 g/dL    RDW 34.7 (H) 42.5 - 14.6 %    Platelets 139 (L) 150 - 450 K/uL    MPV Unable to calculate. Recommend adding IPF. 9.4 - 12.3 FL    nRBC 0.00 0.0 - 0.2 K/uL    Differential Type AUTOMATED      Neutrophils % 84.5 (H) 43.0 - 78.0 %    Lymphocytes % 4.0 (L) 13.0 - 44.0 %    Monocytes % 5.5 4.0 - 12.0 %    Eosinophils % 0.0 (L) 0.5 - 7.8 %    Basophils % 0.9 0.0 - 2.0 %    Immature Granulocytes % 5.1 (H) 0.0 - 5.0 %    Neutrophils Absolute 8.10 1.70 - 8.20 K/UL    Lymphocytes Absolute 0.38 (L) 0.50 - 4.60 K/UL    Monocytes Absolute 0.53 0.10 - 1.30 K/UL    Eosinophils Absolute 0.00 0.00 - 0.80 K/UL    Basophils Absolute 0.09 0.00 - 0.20 K/UL    Immature Granulocytes Absolute 0.49 0.0 - 0.5 K/UL   Basic Metabolic Panel    Collection Time: 07/14/23  6:29 AM   Result Value Ref Range    Sodium 135 (L) 136 - 145 mmol/L    Potassium 4.7 3.5 -  5.1 mmol/L    Chloride 100 98 - 107 mmol/L    CO2 21 20 - 29 mmol/L    Anion Gap 14 7 - 16 mmol/L    Glucose 118 (H) 70 - 99 mg/dL    BUN 50 (H) 8 - 23 MG/DL    Creatinine 9.56 (H) 0.80 - 1.30 MG/DL    Est, Glom Filt Rate 47 (L) >60 ml/min/1.2m2    Calcium 8.9 8.8 - 10.2 MG/DL       No results for input(s): "COVID19" in the last 72 hours.    Recent Vital Data:  Patient Vitals for the past 24 hrs:   Temp Pulse Resp BP SpO2   07/14/23 0905 -- 65 -- 129/73 --   07/14/23 0747 -- 65 18 -- 96 %   07/14/23 0725 97.7 F (36.5 C) 63 18 129/73 98 %   07/13/23 2131 -- 65 20 -- 96 %   07/13/23 2020 97.2 F (36.2 C) 68 18 120/81 95 %   07/13/23 1601 -- 91 -- 126/68 --       Oxygen Therapy  SpO2: 96 %  Pulse via Oximetry: 83 beats per minute  Pulse Oximeter Device Mode: Intermittent  Pulse Oximeter Device Location: Finger  O2 Device: Nasal cannula  Skin Assessment: Clean, dry, & intact  FiO2 : 32 %  O2 Flow Rate (L/min): 2 L/min    Estimated body mass index is 29.05 kg/m as calculated from the following:    Height as of this encounter: 1.676 m (5\' 6" ).    Weight as of this encounter: 81.6 kg (180 lb).    Intake/Output Summary (Last 24 hours) at 07/14/2023 1035  Last data filed at  07/13/2023 2319  Gross per 24 hour   Intake 320 ml   Output 150 ml   Net 170 ml         Physical Exam:    General:    Well nourished.  No overt distress  Head:  Normocephalic, atraumatic  Eyes:  Sclerae appear normal.  Pupils equally round.    HENT:  Nares appear normal, no drainage.  Moist mucous membranes  Neck:  No restricted ROM.  Trachea midline  CV:   RRR.  No m/r/g.  No JVD  Lungs:   CTAB.  No wheezing, rhonchi, or rales.  Respirations even, unlabored  Abdomen:   Soft, nontender, nondistended.    Extremities: Warm and dry.   No edema.    Skin:     No rashes.  Normal coloration  Neuro:  CN II-XII grossly intact.  Psych:  Normal mood and affect.        Time spent in patient discharge and coordination 39 minutes.      Signed:  Verdis Prime,  MD    Part of this note may have been written by using a voice dictation software.  The note has been proof read but may still contain some grammatical/other typographical errors.

## 2023-07-14 NOTE — Plan of Care (Signed)
 Problem: Discharge Planning  Goal: Discharge to home or other facility with appropriate resources  07/14/2023 0923 by Carola Frost, RN  Outcome: Progressing  07/13/2023 2212 by Donell Sievert I, RN  Outcome: Progressing     Problem: Safety - Adult  Goal: Free from fall injury  07/14/2023 0923 by Carola Frost, RN  Outcome: Progressing  07/13/2023 2212 by Donell Sievert I, RN  Outcome: Progressing     Problem: ABCDS Injury Assessment  Goal: Absence of physical injury  07/14/2023 2725 by Carola Frost, RN  Outcome: Progressing  07/13/2023 2212 by Donell Sievert I, RN  Outcome: Progressing     Problem: Respiratory - Adult  Goal: Achieves optimal ventilation and oxygenation  07/14/2023 0923 by Carola Frost, RN  Outcome: Progressing  07/13/2023 2212 by Donell Sievert I, RN  Outcome: Progressing  07/13/2023 2137 by Cecile Hearing, RCP  Outcome: Progressing  Flowsheets (Taken 07/13/2023 2137)  Achieves optimal ventilation and oxygenation:   Assess for changes in respiratory status   Position to facilitate oxygenation and minimize respiratory effort   Initiate smoking cessation protocol as indicated   Assess the need for suctioning and aspirate as needed   Respiratory therapy support as indicated   Assess for changes in mentation and behavior   Oxygen supplementation based on oxygen saturation or arterial blood gases   Encourage broncho-pulmonary hygiene including cough, deep breathe, incentive spirometry   Assess and instruct to report shortness of breath or any respiratory difficulty     Problem: Skin/Tissue Integrity  Goal: Skin integrity remains intact  Description: 1.  Monitor for areas of redness and/or skin breakdown  2.  Assess vascular access sites hourly  3.  Every 4-6 hours minimum:  Change oxygen saturation probe site  4.  Every 4-6 hours:  If on nasal continuous positive airway pressure, respiratory therapy assess nares and determine need for appliance change or  resting period  07/14/2023 3664 by Carola Frost, RN  Outcome: Progressing  07/13/2023 2212 by Donell Sievert I, RN  Outcome: Progressing

## 2023-07-14 NOTE — Progress Notes (Signed)
 Hospitalist Progress Note   Admit Date:  07/04/2023 11:46 AM   Name:  Paul Medina   Age:  89 y.o.  Sex:  male  DOB:  11-Jun-1930   MRN:  [id number]   Room:  357/01    Presenting/Chief Complaint: Shortness of Breath     Reason(s) for Admission: Severe sepsis (HCC) [A41.9, R65.20]  Pneumonia of right lung due to infectious organism, unspecified part of lung [J18.9]  Sepsis, due to unspecified organism, unspecified whether acute organ dysfunction present [facility]) [A41.9]     Hospital Course:   Paul Medina is a 89 y.o. male with chronic combined heart failure, PAF on Eliquis, HTN, CAD, history of TIA, left eye blindness, OSA on CPAP, and CKD stage III admitted on 2/11 due to severe sepsis and acute respiratory failure from bilateral pneumonia consistent with aspiration and hospital-acquired pneumonia.  He also had recently been diagnosed with COVID and treated.  Blood cultures NGTD  UA unremarkable. 2/11 CT chest with worsening right sided infiltrates and improving left sided infiltrates.      Subjective & 24hr Events:   He was laying comfortably on the bed.  He is very hard of hearing.  Marland KitchenHis diarrhea improved. He was tolerating diet well.   Denies chest pain.    Denies fevers.  Denies N/V.    Assessment & Plan:   Severe sepsis (HCC)    Bilateral pneumonia    Aspiration pneumonia (HCC)  Afebrile, leukocytosis normalized  Currently patient is saturating well on room air   Continue Augmentin 500 mg twice daily -EOT 2/20  Continue azithromycin 250 mg daily x 5 days-EOT 2/19        Acute respiratory failure with hypoxia  Continue Brovana 15 mcg twice daily, DuoNeb 4 times daily, Mucinex 600 mg twice daily  Repeat chest x-ray showed pneumonia      HTN (hypertension)  Blood pressure stable  Continue Coreg 6.25 mg twice daily      Stage 3 chronic kidney disease (HCC)  Creatinine slowly improving and baseline is 1.4  Continue to hold Lasix  Check BMP daily      COVID  Patient diagnosed on 2/5  2/16 CRP 6.8 => not a  candidate for baricitinib  Continue Decadron       Chronic combined systolic and diastolic heart failure (HCC)  Euvolemic  Continue to hold Lasix      Paroxysmal atrial fibrillation (HCC)  Continue Coreg 6.25 mg twice daily  Continue Eliquis 2.5 mg twice daily      Coronary atherosclerosis of native coronary artery      History of TIA (transient ischemic attack)      OSA (obstructive sleep apnea)  Continue CPAP at night      History of B-cell lymphoma      DNR (do not resuscitate)    Diarrhea  Follow-up with stool studies    Dispo anticipate hospital stay for 1 to 2 days. Family is not in town and will return on Saturday. They will take care of the patient on Sunday.       Anticipated Discharge Arrangements:   Home Health    PT/OT evals ordered?  Therapy evals ordered  Diet:  ADULT ORAL NUTRITION SUPPLEMENT; Breakfast, Lunch, Dinner; Educational psychologist Protein Oral Supplement  ADULT DIET; Dysphagia - Minced and Moist; Mildly Thick (Nectar); 1500 ml; No Drinking Straws  VTE prophylaxis: Already on anticoagulation  Code status: DNR      Non-peripheral Lines and Tubes (if present):  Telemetry (if present):  Cardiac/Telemetry Monitor On: No        Hospital Problems:  Principal Problem:    Severe sepsis (HCC)  Active Problems:    Chronic combined systolic and diastolic heart failure (HCC)    HTN (hypertension)    HLD (hyperlipidemia)    Coronary atherosclerosis of native coronary artery    History of TIA (transient ischemic attack)    OSA (obstructive sleep apnea)    Stage 3 chronic kidney disease (HCC)    COVID    Atrial fibrillation (HCC)    Bilateral pneumonia    Acute respiratory failure with hypoxia (HCC)    Aspiration pneumonia (HCC)    History of B-cell lymphoma    DNR (do not resuscitate)  Resolved Problems:    * No resolved hospital problems. *      Objective:   Patient Vitals for the past 24 hrs:   Temp Pulse Resp BP SpO2   07/14/23 0905 -- 65 -- 129/73 --   07/14/23 0747 -- 65 18 -- 96 %    07/14/23 0725 97.7 F (36.5 C) 63 18 129/73 98 %   07/13/23 2131 -- 65 20 -- 96 %   07/13/23 2020 97.2 F (36.2 C) 68 18 120/81 95 %   07/13/23 1601 -- 91 -- 126/68 --       Oxygen Therapy  SpO2: 96 %  Pulse via Oximetry: 83 beats per minute  Pulse Oximeter Device Mode: Intermittent  Pulse Oximeter Device Location: Finger  O2 Device: Nasal cannula  Skin Assessment: Clean, dry, & intact  FiO2 : 32 %  O2 Flow Rate (L/min): 2 L/min    Estimated body mass index is 29.05 kg/m as calculated from the following:    Height as of this encounter: 1.676 m (5\' 6" ).    Weight as of this encounter: 81.6 kg (180 lb).    Intake/Output Summary (Last 24 hours) at 07/14/2023 1433  Last data filed at 07/14/2023 1251  Gross per 24 hour   Intake 400 ml   Output 525 ml   Net -125 ml         Physical Exam:   General:    Appears worn out and frail  Head:  Normocephalic, atraumatic  Eyes:  Sclerae appear normal.  Pupils equally round.  ENT:  Nares appear normal.  Moist oral mucosa  Neck:  No restricted ROM.  Trachea midline   CV:   RRR.  No m/r/g.  No jugular venous distension.  Lungs:   Bilateral scattered rhonchi with coarse wheezing.  Symmetric expansion.Abdomen:   Soft, nontender, nondistended.  Extremities: No cyanosis or clubbing.  No edema  Skin:     No rashes.  Normal coloration.   Warm and dry.    Neuro:  CN II-XII grossly intact.    Psych:  Depressed affect    Imaging:    Chest X-Ray 2/11 personally reviewed and interpreted by me        I have personally reviewed labs and tests:  Recent Labs:  Recent Results (from the past 48 hour(s))   Magnesium    Collection Time: 07/13/23  8:48 AM   Result Value Ref Range    Magnesium 2.4 1.8 - 2.4 mg/dL   Phosphorus    Collection Time: 07/13/23  8:48 AM   Result Value Ref Range    Phosphorus 3.1 2.5 - 4.5 MG/DL   CBC with Auto Differential    Collection Time: 07/13/23  8:48 AM  Result Value Ref Range    WBC 10.7 4.3 - 11.1 K/uL    RBC 2.99 (L) 4.23 - 5.6 M/uL    Hemoglobin 8.6 (L) 13.6 -  17.2 g/dL    Hematocrit 16.1 (L) 41.1 - 50.3 %    MCV 85.6 82.0 - 102.0 FL    MCH 28.8 26.1 - 32.9 PG    MCHC 33.6 31.4 - 35.0 g/dL    RDW 09.6 (H) 04.5 - 14.6 %    Platelets 91 (L) 150 - 450 K/uL    MPV Unable to calculate. Recommend adding IPF. 9.4 - 12.3 FL    nRBC 0.00 0.0 - 0.2 K/uL    Differential Type AUTOMATED      Neutrophils % 90.3 (H) 43.0 - 78.0 %    Lymphocytes % 3.4 (L) 13.0 - 44.0 %    Monocytes % 3.1 (L) 4.0 - 12.0 %    Eosinophils % 0.0 (L) 0.5 - 7.8 %    Basophils % 0.6 0.0 - 2.0 %    Immature Granulocytes % 2.6 0.0 - 5.0 %    Neutrophils Absolute 9.71 (H) 1.70 - 8.20 K/UL    Lymphocytes Absolute 0.36 (L) 0.50 - 4.60 K/UL    Monocytes Absolute 0.33 0.10 - 1.30 K/UL    Eosinophils Absolute 0.00 0.00 - 0.80 K/UL    Basophils Absolute 0.06 0.00 - 0.20 K/UL    Immature Granulocytes Absolute 0.28 0.0 - 0.5 K/UL   Basic Metabolic Panel w/ Reflex to MG    Collection Time: 07/13/23  8:48 AM   Result Value Ref Range    Sodium 137 136 - 145 mmol/L    Potassium 4.4 3.5 - 5.1 mmol/L    Chloride 101 98 - 107 mmol/L    CO2 22 20 - 29 mmol/L    Anion Gap 14 7 - 16 mmol/L    Glucose 154 (H) 70 - 99 mg/dL    BUN 56 (H) 8 - 23 MG/DL    Creatinine 4.09 (H) 0.80 - 1.30 MG/DL    Est, Glom Filt Rate 42 (L) >60 ml/min/1.6m2    Calcium 8.8 8.8 - 10.2 MG/DL   Culture, Stool    Collection Time: 07/13/23 11:45 AM    Specimen: Stool   Result Value Ref Range    Special Requests NO SPECIAL REQUESTS      Culture        NO GROWTH OF SALMONELLA OR SHIGELLA IS EVIDENT ON FIRST READING, FINAL REPORT TO FOLLOW AFTER FURTHER INCUBATION OF CULTURE   Occult Blood, Fecal    Collection Time: 07/13/23 11:45 AM   Result Value Ref Range    POC Occult Blood, Fecal Negative NEG     Urinalysis w rflx microscopic    Collection Time: 07/13/23  1:22 PM   Result Value Ref Range    Color, UA YELLOW/STRAW      Appearance CLEAR      Specific Gravity, UA 1.017 1.001 - 1.023      pH, Urine 5.0 5.0 - 9.0      Protein, UA TRACE (A) NEG mg/dL    Glucose,  Ur Negative NEG mg/dL    Ketones, Urine Negative NEG mg/dL    Bilirubin, Urine Negative NEG      Blood, Urine Negative NEG      Urobilinogen, Urine 0.2 0.2 - 1.0 EU/dL    Nitrite, Urine Negative NEG      Leukocyte Esterase, Urine TRACE (A) NEG  WBC, UA 0-3 0 /hpf    RBC, UA 0-3 0 /hpf    Epithelial Cells, UA 0-3 0 /hpf    BACTERIA, URINE 0 0 /hpf    Casts HYALINE 0 /lpf    Crystals URIC ACID CRYSTALS 0 /LPF   Magnesium    Collection Time: 07/14/23  6:29 AM   Result Value Ref Range    Magnesium 2.3 1.8 - 2.4 mg/dL   Phosphorus    Collection Time: 07/14/23  6:29 AM   Result Value Ref Range    Phosphorus 3.4 2.5 - 4.5 MG/DL   CBC with Auto Differential    Collection Time: 07/14/23  6:29 AM   Result Value Ref Range    WBC 9.6 4.3 - 11.1 K/uL    RBC 3.08 (L) 4.23 - 5.6 M/uL    Hemoglobin 8.9 (L) 13.6 - 17.2 g/dL    Hematocrit 47.4 (L) 41.1 - 50.3 %    MCV 85.4 82.0 - 102.0 FL    MCH 28.9 26.1 - 32.9 PG    MCHC 33.8 31.4 - 35.0 g/dL    RDW 25.9 (H) 56.3 - 14.6 %    Platelets 139 (L) 150 - 450 K/uL    MPV Unable to calculate. Recommend adding IPF. 9.4 - 12.3 FL    nRBC 0.00 0.0 - 0.2 K/uL    Differential Type AUTOMATED      Neutrophils % 84.5 (H) 43.0 - 78.0 %    Lymphocytes % 4.0 (L) 13.0 - 44.0 %    Monocytes % 5.5 4.0 - 12.0 %    Eosinophils % 0.0 (L) 0.5 - 7.8 %    Basophils % 0.9 0.0 - 2.0 %    Immature Granulocytes % 5.1 (H) 0.0 - 5.0 %    Neutrophils Absolute 8.10 1.70 - 8.20 K/UL    Lymphocytes Absolute 0.38 (L) 0.50 - 4.60 K/UL    Monocytes Absolute 0.53 0.10 - 1.30 K/UL    Eosinophils Absolute 0.00 0.00 - 0.80 K/UL    Basophils Absolute 0.09 0.00 - 0.20 K/UL    Immature Granulocytes Absolute 0.49 0.0 - 0.5 K/UL   Basic Metabolic Panel    Collection Time: 07/14/23  6:29 AM   Result Value Ref Range    Sodium 135 (L) 136 - 145 mmol/L    Potassium 4.7 3.5 - 5.1 mmol/L    Chloride 100 98 - 107 mmol/L    CO2 21 20 - 29 mmol/L    Anion Gap 14 7 - 16 mmol/L    Glucose 118 (H) 70 - 99 mg/dL    BUN 50 (H) 8 - 23 MG/DL     Creatinine 8.75 (H) 0.80 - 1.30 MG/DL    Est, Glom Filt Rate 47 (L) >60 ml/min/1.77m2    Calcium 8.9 8.8 - 10.2 MG/DL       No results for input(s): "COVID19" in the last 72 hours.    Current Meds:  Current Facility-Administered Medications   Medication Dose Route Frequency    apixaban (ELIQUIS) tablet 5 mg  5 mg Oral 2 times per day    lactobacillus (CULTURELLE) capsule 1 capsule  1 capsule Oral Daily with breakfast    prednisoLONE acetate (PRED FORTE) 1 % ophthalmic suspension 1 drop  1 drop Left Eye Daily    medicated lip ointment (BLISTEX)   Topical PRN    albuterol (PROVENTIL) (2.5 MG/3ML) 0.083% nebulizer solution 2.5 mg  2.5 mg Nebulization Q4H PRN    dexAMETHasone (DECADRON) tablet 6  mg  6 mg Oral Daily    ipratropium 0.5 mg-albuterol 2.5 mg (DUONEB) nebulizer solution 1 Dose  1 Dose Inhalation BID RT    nystatin (MYCOSTATIN) 100000 UNIT/ML suspension 500,000 Units  5 mL Oral 4x Daily    rosuvastatin (CRESTOR) tablet 10 mg  10 mg Oral Nightly    sodium chloride (Inhalant) 3 % nebulizer solution 4 mL  4 mL Nebulization PRN    arformoterol tartrate (BROVANA) nebulizer solution 15 mcg  15 mcg Nebulization BID RT    atropine 1 % ophthalmic solution 1 drop  1 drop Left Eye Nightly    brimonidine (ALPHAGAN) 0.2 % ophthalmic solution 1 drop  1 drop Left Eye TID    sodium chloride flush 0.9 % injection 5-40 mL  5-40 mL IntraVENous 2 times per day    sodium chloride flush 0.9 % injection 5-40 mL  5-40 mL IntraVENous PRN    0.9 % sodium chloride infusion   IntraVENous PRN    potassium chloride (KLOR-CON M) extended release tablet 40 mEq  40 mEq Oral PRN    Or    potassium bicarb-citric acid (EFFER-K) effervescent tablet 40 mEq  40 mEq Oral PRN    Or    potassium chloride 10 mEq/100 mL IVPB (Peripheral Line)  10 mEq IntraVENous PRN    magnesium sulfate 2000 mg in 50 mL IVPB premix  2,000 mg IntraVENous PRN    ondansetron (ZOFRAN-ODT) disintegrating tablet 4 mg  4 mg Oral Q8H PRN    Or    ondansetron (ZOFRAN)  injection 4 mg  4 mg IntraVENous Q6H PRN    polyethylene glycol (GLYCOLAX) packet 17 g  17 g Oral Daily PRN    acetaminophen (TYLENOL) tablet 650 mg  650 mg Oral Q6H PRN    Or    acetaminophen (TYLENOL) suppository 650 mg  650 mg Rectal Q6H PRN    guaiFENesin (MUCINEX) extended release tablet 600 mg  600 mg Oral BID    carvedilol (COREG) tablet 6.25 mg  6.25 mg Oral BID WC    doxazosin (CARDURA) tablet 4 mg  4 mg Oral Daily    pantoprazole (PROTONIX) tablet 40 mg  40 mg Oral QAM AC    [Held by provider] furosemide (LASIX) tablet 20 mg  20 mg Oral Daily       Signed:  Verdis Prime, MD    Part of this note may have been written by using a voice dictation software.  The note has been proof read but may still contain some grammatical/other typographical errors.

## 2023-07-14 NOTE — Other (Signed)
 Patient sleeping soundly. CHF handbook given to he staff to provide to the patient and family.

## 2023-07-14 NOTE — Care Coordination-Inpatient (Signed)
 Pt chart reviewed and discussed in AM IDR for continued stay. LOS 10 days. Pt admitted with severe sepsis and pneumonia of right lung due to infectious organism. Per MD, pt medically stable to discharge pending placement vs home with daughter.    SW CM contacted pt's daughter Clarene Critchley to discuss discharge planning. Eve prefers pt to return to her home with resumption of [facility] services at discharge. Eve discussed plan to pick-up patient Sunday as they will be out of town until late Saturday. CM obtained address pt will be discharging to and sent update to [facility].     Daughter Eve requested aide to be added to [facility] services.     Pt may need a RW at discharge as walker broke from fall prior to hospitalization. CM encouraged daughter Clarene Critchley to notify CM team if RW needed at discharge.    6-minute walk-test ordered for tomorrow 07/15/23 at 830 for home oxygen qualifier.     DC/POC to return to daughters home with resumption of [facility] PT/OT/RN/SLP/aide services Sunday.    CM team will continue to follow for potential discharge needs that may arise.     Gardiner Fanti, MSW, LBSW  Social Work Case Software engineer. [place]

## 2023-07-14 NOTE — Progress Notes (Signed)
 Physical Therapy Note:    Attempted to see patient this AM for physical therapy treatment  session. Patient said he was tired and wanted to sleep  therapist tried to encourage pt to do exercises and get up in the chair.  Pt still said no.   Will follow and re-attempt as schedule permits/patient available. Thank you,    Werner Lean, PTA     Rehab Caseload Tracker

## 2023-07-15 LAB — BASIC METABOLIC PANEL
Anion Gap: 12 mmol/L (ref 7–16)
BUN: 44 mg/dL — ABNORMAL HIGH (ref 8–23)
CO2: 22 mmol/L (ref 20–29)
Calcium: 8.7 mg/dL — ABNORMAL LOW (ref 8.8–10.2)
Chloride: 101 mmol/L (ref 98–107)
Creatinine: 1.31 mg/dL — ABNORMAL HIGH (ref 0.80–1.30)
Est, Glom Filt Rate: 51 mL/min/{1.73_m2} — ABNORMAL LOW (ref >60)
Glucose: 97 mg/dL (ref 70–99)
Potassium: 4.6 mmol/L (ref 3.5–5.1)
Sodium: 135 mmol/L — ABNORMAL LOW (ref 136–145)

## 2023-07-15 LAB — CBC WITH AUTO DIFFERENTIAL
Basophils %: 1.1 % (ref 0.0–2.0)
Basophils Absolute: 0.09 10*3/uL (ref 0.00–0.20)
Eosinophils %: 0 % — ABNORMAL LOW (ref 0.5–7.8)
Eosinophils Absolute: 0 10*3/uL (ref 0.00–0.80)
Hematocrit: 23.8 % — ABNORMAL LOW (ref 41.1–50.3)
Hemoglobin: 8 g/dL — ABNORMAL LOW (ref 13.6–17.2)
Immature Granulocytes %: 6.5 % — ABNORMAL HIGH (ref 0.0–5.0)
Immature Granulocytes Absolute: 0.55 10*3/uL — ABNORMAL HIGH (ref 0.0–0.5)
Lymphocytes %: 4.8 % — ABNORMAL LOW (ref 13.0–44.0)
Lymphocytes Absolute: 0.41 10*3/uL — ABNORMAL LOW (ref 0.50–4.60)
MCH: 29 pg (ref 26.1–32.9)
MCHC: 33.6 g/dL (ref 31.4–35.0)
MCV: 86.2 fL (ref 82.0–102.0)
MPV: 12.7 fL — ABNORMAL HIGH (ref 9.4–12.3)
Monocytes %: 6.5 % (ref 4.0–12.0)
Monocytes Absolute: 0.55 10*3/uL (ref 0.10–1.30)
Neutrophils %: 81.1 % — ABNORMAL HIGH (ref 43.0–78.0)
Neutrophils Absolute: 6.9 10*3/uL (ref 1.70–8.20)
Platelets: 122 10*3/uL — ABNORMAL LOW (ref 150–450)
RBC: 2.76 M/uL — ABNORMAL LOW (ref 4.23–5.6)
RDW: 15.2 % — ABNORMAL HIGH (ref 11.9–14.6)
WBC: 8.5 10*3/uL (ref 4.3–11.1)
nRBC: 0 10*3/uL (ref 0.0–0.2)

## 2023-07-15 LAB — CULTURE, STOOL: Culture: NORMAL

## 2023-07-15 MED FILL — DEXAMETHASONE 4 MG PO TABS: 4 MG | ORAL | Qty: 2

## 2023-07-15 MED FILL — CULTURELLE PO CAPS: ORAL | Qty: 1

## 2023-07-15 MED FILL — ARFORMOTEROL TARTRATE 15 MCG/2ML IN NEBU: 152 MCG/2ML | RESPIRATORY_TRACT | Qty: 2

## 2023-07-15 MED FILL — ROSUVASTATIN CALCIUM 5 MG PO TABS: 5 MG | ORAL | Qty: 2

## 2023-07-15 MED FILL — DOXAZOSIN MESYLATE 4 MG PO TABS: 4 MG | ORAL | Qty: 1

## 2023-07-15 MED FILL — GUAIFENESIN ER 600 MG PO TB12: 600 MG | ORAL | Qty: 1

## 2023-07-15 MED FILL — ELIQUIS 5 MG PO TABS: 5 MG | ORAL | Qty: 1

## 2023-07-15 MED FILL — NYSTATIN 100000 UNIT/ML MT SUSP: 100000 UNIT/ML | OROMUCOSAL | Qty: 5

## 2023-07-15 MED FILL — CARVEDILOL 6.25 MG PO TABS: 6.25 MG | ORAL | Qty: 1

## 2023-07-15 MED FILL — IPRATROPIUM-ALBUTEROL 0.5-2.5 (3) MG/3ML IN SOLN: 0.5-2.533 (3) MG/3ML | RESPIRATORY_TRACT | Qty: 3

## 2023-07-15 MED FILL — PANTOPRAZOLE SODIUM 40 MG PO TBEC: 40 MG | ORAL | Qty: 1

## 2023-07-15 NOTE — Progress Notes (Addendum)
 Hospitalist Progress Note   Admit Date:  07/04/2023 11:46 AM   Name:  Paul Medina   Age:  89 y.o.  Sex:  male  DOB:  06/26/1930   MRN:  [id number]   Room:  357/01    Presenting/Chief Complaint: Shortness of Breath     Reason(s) for Admission: Severe sepsis (HCC) [A41.9, R65.20]  Pneumonia of right lung due to infectious organism, unspecified part of lung [J18.9]  Sepsis, due to unspecified organism, unspecified whether acute organ dysfunction present [facility]) [A41.9]     Hospital Course:   Paul Medina is a 89 y.o. male with chronic combined heart failure, PAF on Eliquis, HTN, CAD, history of TIA, left eye blindness, OSA on CPAP, and CKD stage III admitted on 2/11 due to severe sepsis and acute respiratory failure from bilateral pneumonia consistent with aspiration and hospital-acquired pneumonia.  He also had recently been diagnosed with COVID and treated.  Blood cultures NGTD  UA unremarkable. 2/11 CT chest with worsening right sided infiltrates and improving left sided infiltrates.      Subjective & 24hr Events:   He was laying comfortably on the bed.  He is very hard of hearing.Marland Kitchen He was tolerating diet well.   Denies chest pain.    Denies fevers.  Denies N/V.    Assessment & Plan:   Severe sepsis (HCC)    Bilateral pneumonia    Aspiration pneumonia (HCC)  Afebrile, leukocytosis normalized  Currently patient is saturating well on [facility]  Continue Augmentin 500 mg twice daily -EOT 2/20  Continue azithromycin 250 mg daily x 5 days-EOT 2/19        Acute respiratory failure with hypoxia  Continue Brovana 15 mcg twice daily, DuoNeb 4 times daily, Mucinex 600 mg twice daily      HTN (hypertension)  Continue Coreg 6.25 mg twice daily      Stage 3 chronic kidney disease (HCC)  Creatinine is back to baseline  1.4  Continue to hold Lasix  Check BMP daily      COVID  Patient diagnosed on 2/5  2/16 CRP 6.8 => not a candidate for baricitinib  Continue Decadron       Chronic combined systolic and diastolic heart  failure (HCC)  Euvolemic  Continue to hold Lasix      Paroxysmal atrial fibrillation (HCC)  Continue Coreg 6.25 mg twice daily, Eliquis 2.5 mg twice daily      Coronary atherosclerosis of native coronary artery      History of TIA (transient ischemic attack)      OSA (obstructive sleep apnea)  Continue CPAP at night      History of B-cell lymphoma      DNR (do not resuscitate)    Diarrhea      Dispo anticipate hospital stay for 1 to 2 days. Family is not in town and will return on Saturday. They will take care of the patient on Sunday.       Anticipated Discharge Arrangements:   Home Health    PT/OT evals ordered?  Therapy evals ordered  Diet:  ADULT ORAL NUTRITION SUPPLEMENT; Breakfast, Lunch, Dinner; Educational psychologist Protein Oral Supplement  ADULT DIET; Dysphagia - Minced and Moist; Mildly Thick (Nectar); 1500 ml; No Drinking Straws  VTE prophylaxis: Already on anticoagulation  Code status: DNR      Non-peripheral Lines and Tubes (if present):          Telemetry (if present):  Cardiac/Telemetry Monitor On: No  Hospital Problems:  Principal Problem:    Severe sepsis (HCC)  Active Problems:    Chronic combined systolic and diastolic heart failure (HCC)    HTN (hypertension)    HLD (hyperlipidemia)    Coronary atherosclerosis of native coronary artery    History of TIA (transient ischemic attack)    OSA (obstructive sleep apnea)    Stage 3 chronic kidney disease (HCC)    COVID    Atrial fibrillation (HCC)    Bilateral pneumonia    Acute respiratory failure with hypoxia (HCC)    Aspiration pneumonia (HCC)    History of B-cell lymphoma    DNR (do not resuscitate)  Resolved Problems:    * No resolved hospital problems. *      Objective:   Patient Vitals for the past 24 hrs:   Temp Pulse Resp BP SpO2   07/15/23 0838 -- 74 18 -- 96 %   07/15/23 0811 97.6 F (36.4 C) 65 18 128/68 97 %   07/14/23 2227 -- 74 -- -- 96 %   07/14/23 2200 -- 71 -- 138/73 97 %   07/14/23 1915 97.7 F (36.5 C) 73 16 (!) 109/57 95  %   07/14/23 1714 -- 78 -- 123/62 --   07/14/23 1446 -- -- -- -- 97 %       Oxygen Therapy  SpO2: 96 %  Pulse via Oximetry: 83 beats per minute  Pulse Oximeter Device Mode: Intermittent  Pulse Oximeter Device Location: Finger  O2 Device: Nasal cannula  Skin Assessment: Clean, dry, & intact  FiO2 : 32 %  O2 Flow Rate (L/min): 2 L/min    Estimated body mass index is 29.05 kg/m as calculated from the following:    Height as of this encounter: 1.676 m (5\' 6" ).    Weight as of this encounter: 81.6 kg (180 lb).    Intake/Output Summary (Last 24 hours) at 07/15/2023 1131  Last data filed at 07/15/2023 1052  Gross per 24 hour   Intake 800 ml   Output 1095 ml   Net -295 ml         Physical Exam:   General:    Appears worn out and frail  Head:  Normocephalic, atraumatic  Eyes:  Sclerae appear normal.  Pupils equally round.  ENT:  Nares appear normal.  Moist oral mucosa  Neck:  No restricted ROM.  Trachea midline   CV:   RRR.  No m/r/g.  No jugular venous distension.  Lungs:   Bilateral scattered rhonchi with coarse wheezing.  Symmetric expansion.Abdomen:   Soft, nontender, nondistended.  Extremities: No cyanosis or clubbing.  No edema  Skin:     No rashes.  Normal coloration.   Warm and dry.    Neuro:  CN II-XII grossly intact.    Psych:  Depressed affect    Imaging:    Chest X-Ray 2/11 personally reviewed and interpreted by me        I have personally reviewed labs and tests:  Recent Labs:  Recent Results (from the past 48 hour(s))   Culture, Stool    Collection Time: 07/13/23 11:45 AM    Specimen: Stool   Result Value Ref Range    Special Requests NO SPECIAL REQUESTS      Culture No Salmonella, Shigella, or Ecoli 0157 isolated.      Culture No normal enteric gram negative rods isolated.     Occult Blood, Fecal    Collection Time: 07/13/23 11:45 AM  Result Value Ref Range    POC Occult Blood, Fecal Negative NEG     Urinalysis w rflx microscopic    Collection Time: 07/13/23  1:22 PM   Result Value Ref Range    Color, UA  YELLOW/STRAW      Appearance CLEAR      Specific Gravity, UA 1.017 1.001 - 1.023      pH, Urine 5.0 5.0 - 9.0      Protein, UA TRACE (A) NEG mg/dL    Glucose, Ur Negative NEG mg/dL    Ketones, Urine Negative NEG mg/dL    Bilirubin, Urine Negative NEG      Blood, Urine Negative NEG      Urobilinogen, Urine 0.2 0.2 - 1.0 EU/dL    Nitrite, Urine Negative NEG      Leukocyte Esterase, Urine TRACE (A) NEG      WBC, UA 0-3 0 /hpf    RBC, UA 0-3 0 /hpf    Epithelial Cells, UA 0-3 0 /hpf    BACTERIA, URINE 0 0 /hpf    Casts HYALINE 0 /lpf    Crystals URIC ACID CRYSTALS 0 /LPF   Magnesium    Collection Time: 07/14/23  6:29 AM   Result Value Ref Range    Magnesium 2.3 1.8 - 2.4 mg/dL   Phosphorus    Collection Time: 07/14/23  6:29 AM   Result Value Ref Range    Phosphorus 3.4 2.5 - 4.5 MG/DL   CBC with Auto Differential    Collection Time: 07/14/23  6:29 AM   Result Value Ref Range    WBC 9.6 4.3 - 11.1 K/uL    RBC 3.08 (L) 4.23 - 5.6 M/uL    Hemoglobin 8.9 (L) 13.6 - 17.2 g/dL    Hematocrit 60.6 (L) 41.1 - 50.3 %    MCV 85.4 82.0 - 102.0 FL    MCH 28.9 26.1 - 32.9 PG    MCHC 33.8 31.4 - 35.0 g/dL    RDW 30.1 (H) 60.1 - 14.6 %    Platelets 139 (L) 150 - 450 K/uL    MPV Unable to calculate. Recommend adding IPF. 9.4 - 12.3 FL    nRBC 0.00 0.0 - 0.2 K/uL    Differential Type AUTOMATED      Neutrophils % 84.5 (H) 43.0 - 78.0 %    Lymphocytes % 4.0 (L) 13.0 - 44.0 %    Monocytes % 5.5 4.0 - 12.0 %    Eosinophils % 0.0 (L) 0.5 - 7.8 %    Basophils % 0.9 0.0 - 2.0 %    Immature Granulocytes % 5.1 (H) 0.0 - 5.0 %    Neutrophils Absolute 8.10 1.70 - 8.20 K/UL    Lymphocytes Absolute 0.38 (L) 0.50 - 4.60 K/UL    Monocytes Absolute 0.53 0.10 - 1.30 K/UL    Eosinophils Absolute 0.00 0.00 - 0.80 K/UL    Basophils Absolute 0.09 0.00 - 0.20 K/UL    Immature Granulocytes Absolute 0.49 0.0 - 0.5 K/UL   Basic Metabolic Panel    Collection Time: 07/14/23  6:29 AM   Result Value Ref Range    Sodium 135 (L) 136 - 145 mmol/L    Potassium 4.7 3.5 -  5.1 mmol/L    Chloride 100 98 - 107 mmol/L    CO2 21 20 - 29 mmol/L    Anion Gap 14 7 - 16 mmol/L    Glucose 118 (H) 70 - 99 mg/dL    BUN 50 (H)  8 - 23 MG/DL    Creatinine 9.60 (H) 0.80 - 1.30 MG/DL    Est, Glom Filt Rate 47 (L) >60 ml/min/1.74m2    Calcium 8.9 8.8 - 10.2 MG/DL   CBC with Auto Differential    Collection Time: 07/15/23  6:33 AM   Result Value Ref Range    WBC 8.5 4.3 - 11.1 K/uL    RBC 2.76 (L) 4.23 - 5.6 M/uL    Hemoglobin 8.0 (L) 13.6 - 17.2 g/dL    Hematocrit 45.4 (L) 41.1 - 50.3 %    MCV 86.2 82.0 - 102.0 FL    MCH 29.0 26.1 - 32.9 PG    MCHC 33.6 31.4 - 35.0 g/dL    RDW 09.8 (H) 11.9 - 14.6 %    Platelets 122 (L) 150 - 450 K/uL    MPV 12.7 (H) 9.4 - 12.3 FL    nRBC 0.00 0.0 - 0.2 K/uL    Neutrophils % 81.1 (H) 43.0 - 78.0 %    Lymphocytes % 4.8 (L) 13.0 - 44.0 %    Monocytes % 6.5 4.0 - 12.0 %    Eosinophils % 0.0 (L) 0.5 - 7.8 %    Basophils % 1.1 0.0 - 2.0 %    Immature Granulocytes % 6.5 (H) 0.0 - 5.0 %    Neutrophils Absolute 6.90 1.70 - 8.20 K/UL    Lymphocytes Absolute 0.41 (L) 0.50 - 4.60 K/UL    Monocytes Absolute 0.55 0.10 - 1.30 K/UL    Eosinophils Absolute 0.00 0.00 - 0.80 K/UL    Basophils Absolute 0.09 0.00 - 0.20 K/UL    Immature Granulocytes Absolute 0.55 (H) 0.0 - 0.5 K/UL    RBC Comment MODERATE  ANISOCYTOSIS + POIKILOCYTOSIS        RBC Comment SLIGHT  OVALOCYTES        RBC Comment SLIGHT  SCHISTOCYTES        RBC Comment SLIGHT  TEARDROP CELLS        WBC Comment Result Confirmed By Smear      Platelet Comment SLIGHT      Differential Type AUTOMATED     Basic Metabolic Panel    Collection Time: 07/15/23  6:33 AM   Result Value Ref Range    Sodium 135 (L) 136 - 145 mmol/L    Potassium 4.6 3.5 - 5.1 mmol/L    Chloride 101 98 - 107 mmol/L    CO2 22 20 - 29 mmol/L    Anion Gap 12 7 - 16 mmol/L    Glucose 97 70 - 99 mg/dL    BUN 44 (H) 8 - 23 MG/DL    Creatinine 1.47 (H) 0.80 - 1.30 MG/DL    Est, Glom Filt Rate 51 (L) >60 ml/min/1.11m2    Calcium 8.7 (L) 8.8 - 10.2 MG/DL       No  results for input(s): "COVID19" in the last 72 hours.    Current Meds:  Current Facility-Administered Medications   Medication Dose Route Frequency    apixaban (ELIQUIS) tablet 5 mg  5 mg Oral 2 times per day    lactobacillus (CULTURELLE) capsule 1 capsule  1 capsule Oral Daily with breakfast    prednisoLONE acetate (PRED FORTE) 1 % ophthalmic suspension 1 drop  1 drop Left Eye Daily    medicated lip ointment (BLISTEX)   Topical PRN    albuterol (PROVENTIL) (2.5 MG/3ML) 0.083% nebulizer solution 2.5 mg  2.5 mg Nebulization Q4H PRN    dexAMETHasone (DECADRON) tablet  6 mg  6 mg Oral Daily    ipratropium 0.5 mg-albuterol 2.5 mg (DUONEB) nebulizer solution 1 Dose  1 Dose Inhalation BID RT    nystatin (MYCOSTATIN) 100000 UNIT/ML suspension 500,000 Units  5 mL Oral 4x Daily    rosuvastatin (CRESTOR) tablet 10 mg  10 mg Oral Nightly    sodium chloride (Inhalant) 3 % nebulizer solution 4 mL  4 mL Nebulization PRN    arformoterol tartrate (BROVANA) nebulizer solution 15 mcg  15 mcg Nebulization BID RT    atropine 1 % ophthalmic solution 1 drop  1 drop Left Eye Nightly    brimonidine (ALPHAGAN) 0.2 % ophthalmic solution 1 drop  1 drop Left Eye TID    sodium chloride flush 0.9 % injection 5-40 mL  5-40 mL IntraVENous 2 times per day    sodium chloride flush 0.9 % injection 5-40 mL  5-40 mL IntraVENous PRN    0.9 % sodium chloride infusion   IntraVENous PRN    potassium chloride (KLOR-CON M) extended release tablet 40 mEq  40 mEq Oral PRN    Or    potassium bicarb-citric acid (EFFER-K) effervescent tablet 40 mEq  40 mEq Oral PRN    Or    potassium chloride 10 mEq/100 mL IVPB (Peripheral Line)  10 mEq IntraVENous PRN    magnesium sulfate 2000 mg in 50 mL IVPB premix  2,000 mg IntraVENous PRN    ondansetron (ZOFRAN-ODT) disintegrating tablet 4 mg  4 mg Oral Q8H PRN    Or    ondansetron (ZOFRAN) injection 4 mg  4 mg IntraVENous Q6H PRN    polyethylene glycol (GLYCOLAX) packet 17 g  17 g Oral Daily PRN    acetaminophen (TYLENOL)  tablet 650 mg  650 mg Oral Q6H PRN    Or    acetaminophen (TYLENOL) suppository 650 mg  650 mg Rectal Q6H PRN    guaiFENesin (MUCINEX) extended release tablet 600 mg  600 mg Oral BID    carvedilol (COREG) tablet 6.25 mg  6.25 mg Oral BID WC    doxazosin (CARDURA) tablet 4 mg  4 mg Oral Daily    pantoprazole (PROTONIX) tablet 40 mg  40 mg Oral QAM AC    [Held by provider] furosemide (LASIX) tablet 20 mg  20 mg Oral Daily       Signed:  Verdis Prime, MD    Part of this note may have been written by using a voice dictation software.  The note has been proof read but may still contain some grammatical/other typographical errors.

## 2023-07-15 NOTE — Progress Notes (Signed)
 ACUTE PHYSICAL THERAPY GOALS:   (Developed with and agreed upon by patient and/or caregiver.)      LTG:  (1.)Paul Medina will move from supine to sit and sit to supine  in bed with INDEPENDENT within 7 treatment day(s).    (2.)Paul Medina will transfer from bed to chair and chair to bed with SUPERVISION using the least restrictive device within 7 treatment day(s).    (3.)Paul Medina will ambulate with SUPERVISION for 200 feet with the least restrictive device within 7 treatment day(s).  ________________________________________________________________________________________________      PHYSICAL THERAPY Daily Note and PM  (Link to Caseload Tracking: PT Visit Days : 1  Acknowledge Orders  Time In/Out  PT Charge Capture  Rehab Caseload Tracker    Paul Medina is a 89 y.o. male   PRIMARY DIAGNOSIS: Severe sepsis (HCC)  Severe sepsis (HCC) [A41.9, R65.20]  Pneumonia of right lung due to infectious organism, unspecified part of lung [J18.9]  Sepsis, due to unspecified organism, unspecified whether acute organ dysfunction present (HCC) [A41.9]       Reason for Referral: Generalized Muscle Weakness (M62.81)  Difficulty in walking, Not elsewhere classified (R26.2)  Inpatient: Payor: [employer] / Plan: [employer] / Product Type: *No Product type* /     ASSESSMENT:     REHAB RECOMMENDATIONS:   Recommendation to date pending progress:  Setting:  Short-term Rehab    Equipment:    None     ASSESSMENT:  Paul Medina admitted with above diagnoses.  He presented to the ER with complaints of cough and shortness of breath.  CT showed worsening R-sided pneumonia and worsening mucus plugging.  Patient was hospitalized at [facility] 2/5-2/10 with a-fib and pneumonia.  Short term rehab was recommended during last admission but patient declined and d/c'd home with HH orders.  Patient reports he was told he would have to be in a facility for 2 weeks for rehab and he did not want that.  Explained to patient that there was no  way for [place] to know how long he would need to stay for rehab but he reports he still plans to return home with [facility].  Patient currently demonstrates decreased strength, balance, mobility, and tolerance for activity.  He would benefit from therapy to address these deficits.  He is on 3L O2 at this time but did not qualify for O2 2 days ago when he was d/c'd from [facility].  Patient educated on need for mobility/time out of bed to improve strength, mobility, and ability to safely return home.       2/22:  Patient initially declining  out of bed - "not today".  Reminded patient that he refused yesterday and that he is supposed to be up for all eating/drinking.  Patient stated "I was not aware of that" but he was told that same thing by this therapist multiple times and he always responds the same (not aware of that).  Patient did finally agree and stated he needed to use the bathroom-wanted to walk into the bathroom.  Ambulatory with ROLLING WALKER in room with STANDBY ASSIST.  Needing assist for hygiene after BM and for brief management.  Also needing assist to stand from low toilet.  He did walk to the chair and was set up with lunch tray.  Patient always looks and sounds better when in the chair vs the bed and always remind him of that.  SHORT TERM REHAB has been recommended but appears daughter is taking him home.  Patient concerned about not having his adjustable bed at daughter's house and not sure if she is aware that he is needing assist for hygiene/management of bowel and bladder.       [employer] AM-PACT "6 Clicks" Basic Mobility Inpatient Short Form  AM-PAC Basic Mobility - Inpatient   How much help is needed turning from your back to your side while in a flat bed without using bedrails?: A Little  How much help is needed moving from lying on your back to sitting on the side of a flat bed without using bedrails?: A Little  How much help is needed moving to and from a bed to a chair?: A Little  How much help is  needed standing up from a chair using your arms?: A Little  How much help is needed walking in hospital room?: A Little  How much help is needed climbing 3-5 steps with a railing?: A Little  AM-PAC Inpatient Mobility Raw Score : 18  AM-PAC Inpatient T-Scale Score : 43.63  Mobility Inpatient CMS 0-100% Score: 46.58  Mobility Inpatient CMS G-Code Modifier : CK    SUBJECTIVE:   Paul Medina agreeable after encouragement.      Social/Functional Lives With: Alone  Type of Home: Senior housing apartment  Home Layout: One level  Home Access: Level entry, Occupational psychologist Shower/Tub: Pension scheme manager: Midwife: Grab bars in shower, Grab bars around toilet  Bathroom Accessibility: Accessible  Home Equipment: Engineer, production Help From: Family  Prior Level of Assist for ADLs: Independent  Prior Level of Assist for [employer]: Independent  Prior Level of Assist for Transfers: Independent  Occupation: Retired    OBJECTIVE:     PAIN: VITALS / O2: PRECAUTION / LINES / DRAINS:   Pre Treatment:  no complaints         Post Treatment: no complaints Vitals       Oxygen  O2 Device: Nasal cannula  O2 Flow Rate (L/min): 2 L/min   None    RESTRICTIONS/PRECAUTIONS:  Restrictions/Precautions: Isolation, Fall Risk, Bed Alarm                 GROSS EVALUATION:  Intact Impaired (Comments):   AROM []   Generally decreased,  but functional    PROM []     Strength []   Generally decreased, but functional    Balance []  Sitting - Static: Good  Sitting - Dynamic: Fair, +  Standing - Static: Fair, +  Standing - Dynamic: Fair   Posture []  Forward Head  Rounded Shoulders  Thoracic Kyphosis   Sensation [x]   grossly   Coordination []    Generally decreased, but functional   Tone []   Hypotonus throughout   Edema []     Activity Tolerance []   limited    []       COGNITION/  PERCEPTION: Intact Impaired (Comments):   Orientation [x]      Vision [x]      Hearing []   B hearing aids   Cognition  [x]        MOBILITY: I Mod I S SBA  CGA Min Mod Max Total  NT x2 Comments:   Bed Mobility    Rolling []  []  []  []  []  []  []  []  []  []  []     Supine to Sit []  []  []  [x]  []  []  []  []  []  []  []     Scooting []  []  []  [x]  []  []  []  []  []  []  []     Sit to Supine []  []  []  []  []  []  []  []  []  []  []   Transfers    Sit to Stand []  []  []  []  [x]  [x]  []  []  []  []  []  Min A from low toilet   Bed to Chair []  []  []  [x]  []  []  []  []  []  []  []     Stand to Sit []  []  []  [x]  []  []  []  []  []  []  []     Toilet  []  []  []  [x]  []  []  []  []  []  []  []     I=Independent, Mod I=Modified Independent, S=Supervision, SBA=Standby Assistance, CGA=Contact Guard Assistance,   Min=Minimal Assistance, Mod=Moderate Assistance, Max=Maximal Assistance, Total=Total Assistance, NT=Not Tested    GAIT: I Mod I S SBA CGA Min Mod Max Total  NT x2 Comments:   Level of Assistance []  []  []  [x]  []  []  []  []  []  []  []     Distance 12 (x 2)     DME Rolling Walker    Gait Quality Decreased cadence , Decreased step clearance, Decreased step length, and Trunk flexion    Weightbearing Status     Stairs      I=Independent, Mod I=Modified Independent, S=Supervision, SBA=Standby Assistance, CGA=Contact Guard Assistance,   Min=Minimal Assistance, Mod=Moderate Assistance, Max=Maximal Assistance, Total=Total Assistance, NT=Not Tested    PLAN:   FREQUENCY AND DURATION: Daily for duration of hospital stay or until stated goals are met, whichever comes first.    THERAPY PROGNOSIS: Good    PROBLEM LIST:   (Skilled intervention is medically necessary to address:)  Decreased ADL/Functional Activities  Decreased Activity Tolerance  Decreased AROM/PROM  Decreased Balance  Decreased Gait Ability  Decreased Strength  Decreased Transfer Abilities INTERVENTIONS PLANNED:   (Benefits and precautions of physical therapy have been discussed with the patient.)  Therapeutic Activity  Therapeutic Exercise/HEP  Gait Training  Education       TREATMENT:       TREATMENT:   Therapeutic Activity (30 Minutes): Therapeutic activity included Supine to Sit,  Scooting, Transfer Training, Ambulation on level ground, Sitting balance , Standing balance, and toileting and hygiene in standing to improve functional Activity tolerance, Balance, Coordination, Mobility, Strength, and ROM.    TREATMENT GRID:  N/A    AFTER TREATMENT PRECAUTIONS: Alarm Activated, Bed/Chair Locked, Call light within reach, Chair, and Needs within reach    INTERDISCIPLINARY COLLABORATION:  RN/ PCT    EDUCATION: Education Given To: Patient  Education Provided: Role of Therapy;Plan of Programmer, applications Method: Verbal  Education Outcome: Verbalized understanding;Continued education needed  Educated patient and/or family/caregiver on the following: Assistive device indications/utilization/safety, Fall prevention strategies, and Home Safety    TIME IN/OUT:  Time In: 1230  Time Out: 1300  Minutes: 30    Georgeanna Harrison, PT

## 2023-07-15 NOTE — Plan of Care (Signed)
 Problem: Discharge Planning  Goal: Discharge to home or other facility with appropriate resources  07/15/2023 2302 by Waynetta Pean, RN  Outcome: Progressing  Flowsheets (Taken 07/15/2023 2001)  Discharge to home or other facility with appropriate resources: Identify discharge learning needs (meds, wound care, etc)  07/15/2023 1822 by Deland Pretty, RN  Outcome: Progressing     Problem: Safety - Adult  Goal: Free from fall injury  07/15/2023 2302 by Waynetta Pean, RN  Outcome: Progressing  07/15/2023 1822 by Deland Pretty, RN  Outcome: Adequate for Discharge     Problem: ABCDS Injury Assessment  Goal: Absence of physical injury  07/15/2023 2302 by Waynetta Pean, RN  Outcome: Progressing  07/15/2023 1822 by Deland Pretty, RN  Outcome: Adequate for Discharge     Problem: Respiratory - Adult  Goal: Achieves optimal ventilation and oxygenation  07/15/2023 2302 by Waynetta Pean, RN  Outcome: Progressing  07/15/2023 1822 by Deland Pretty, RN  Outcome: Progressing     Problem: Skin/Tissue Integrity  Goal: Skin integrity remains intact  Description: 1.  Monitor for areas of redness and/or skin breakdown  2.  Assess vascular access sites hourly  3.  Every 4-6 hours minimum:  Change oxygen saturation probe site  4.  Every 4-6 hours:  If on nasal continuous positive airway pressure, respiratory therapy assess nares and determine need for appliance change or resting period  07/15/2023 2302 by Waynetta Pean, RN  Outcome: Progressing  07/15/2023 1822 by Deland Pretty, RN  Outcome: Progressing

## 2023-07-15 NOTE — Plan of Care (Signed)
 Problem: Discharge Planning  Goal: Discharge to home or other facility with appropriate resources  Outcome: Progressing     Problem: Respiratory - Adult  Goal: Achieves optimal ventilation and oxygenation  Outcome: Progressing     Problem: Skin/Tissue Integrity  Goal: Skin integrity remains intact  Description: 1.  Monitor for areas of redness and/or skin breakdown  2.  Assess vascular access sites hourly  3.  Every 4-6 hours minimum:  Change oxygen saturation probe site  4.  Every 4-6 hours:  If on nasal continuous positive airway pressure, respiratory therapy assess nares and determine need for appliance change or resting period  Outcome: Progressing     Problem: Safety - Adult  Goal: Free from fall injury  Outcome: Adequate for Discharge     Problem: ABCDS Injury Assessment  Goal: Absence of physical injury  Outcome: Adequate for Discharge

## 2023-07-16 LAB — CBC WITH AUTO DIFFERENTIAL
Basophils %: 1 % (ref 0.0–2.0)
Basophils Absolute: 0.09 10*3/uL (ref 0.00–0.20)
Eosinophils %: 0 % — ABNORMAL LOW (ref 0.5–7.8)
Eosinophils Absolute: 0 10*3/uL (ref 0.00–0.80)
Hematocrit: 23.8 % — ABNORMAL LOW (ref 41.1–50.3)
Hemoglobin: 8.1 g/dL — ABNORMAL LOW (ref 13.6–17.2)
Immature Granulocytes %: 4.4 % (ref 0.0–5.0)
Immature Granulocytes Absolute: 0.39 10*3/uL (ref 0.0–0.5)
Lymphocytes %: 4.6 % — ABNORMAL LOW (ref 13.0–44.0)
Lymphocytes Absolute: 0.41 10*3/uL — ABNORMAL LOW (ref 0.50–4.60)
MCH: 29.7 pg (ref 26.1–32.9)
MCHC: 34 g/dL (ref 31.4–35.0)
MCV: 87.2 fL (ref 82.0–102.0)
MPV: UNDETERMINED fL (ref 9.4–12.3)
Monocytes %: 5.7 % (ref 4.0–12.0)
Monocytes Absolute: 0.51 10*3/uL (ref 0.10–1.30)
Neutrophils %: 84.3 % — ABNORMAL HIGH (ref 43.0–78.0)
Neutrophils Absolute: 7.5 10*3/uL (ref 1.70–8.20)
Platelets: 115 10*3/uL — ABNORMAL LOW (ref 150–450)
RBC: 2.73 M/uL — ABNORMAL LOW (ref 4.23–5.6)
RDW: 15.4 % — ABNORMAL HIGH (ref 11.9–14.6)
WBC: 8.9 10*3/uL (ref 4.3–11.1)
nRBC: 0 10*3/uL (ref 0.0–0.2)

## 2023-07-16 LAB — BASIC METABOLIC PANEL
Anion Gap: 8 mmol/L (ref 7–16)
BUN: 40 mg/dL — ABNORMAL HIGH (ref 8–23)
CO2: 21 mmol/L (ref 20–29)
Calcium: 8.6 mg/dL — ABNORMAL LOW (ref 8.8–10.2)
Chloride: 104 mmol/L (ref 98–107)
Creatinine: 1.39 mg/dL — ABNORMAL HIGH (ref 0.80–1.30)
Est, Glom Filt Rate: 48 mL/min/{1.73_m2} — ABNORMAL LOW (ref >60)
Glucose: 111 mg/dL — ABNORMAL HIGH (ref 70–99)
Potassium: 5 mmol/L (ref 3.5–5.1)
Sodium: 133 mmol/L — ABNORMAL LOW (ref 136–145)

## 2023-07-16 LAB — MAGNESIUM: Magnesium: 2.3 mg/dL (ref 1.8–2.4)

## 2023-07-16 LAB — PHOSPHORUS: Phosphorus: 3.8 mg/dL (ref 2.5–4.5)

## 2023-07-16 MED FILL — ELIQUIS 5 MG PO TABS: 5 MG | ORAL | Qty: 1

## 2023-07-16 MED FILL — CARVEDILOL 6.25 MG PO TABS: 6.25 MG | ORAL | Qty: 1

## 2023-07-16 MED FILL — PANTOPRAZOLE SODIUM 40 MG PO TBEC: 40 MG | ORAL | Qty: 1

## 2023-07-16 MED FILL — ROSUVASTATIN CALCIUM 5 MG PO TABS: 5 MG | ORAL | Qty: 2

## 2023-07-16 MED FILL — NYSTATIN 100000 UNIT/ML MT SUSP: 100000 UNIT/ML | OROMUCOSAL | Qty: 5

## 2023-07-16 MED FILL — IPRATROPIUM-ALBUTEROL 0.5-2.5 (3) MG/3ML IN SOLN: 0.5-2.533 (3) MG/3ML | RESPIRATORY_TRACT | Qty: 3

## 2023-07-16 MED FILL — GUAIFENESIN ER 600 MG PO TB12: 600 MG | ORAL | Qty: 1

## 2023-07-16 MED FILL — ARFORMOTEROL TARTRATE 15 MCG/2ML IN NEBU: 152 MCG/2ML | RESPIRATORY_TRACT | Qty: 2

## 2023-07-16 MED FILL — DOXAZOSIN MESYLATE 4 MG PO TABS: 4 MG | ORAL | Qty: 1

## 2023-07-16 MED FILL — DEXAMETHASONE 4 MG PO TABS: 4 MG | ORAL | Qty: 2

## 2023-07-16 MED FILL — CULTURELLE PO CAPS: ORAL | Qty: 1

## 2023-07-16 MED FILL — ALBUTEROL SULFATE (2.5 MG/3ML) 0.083% IN NEBU: RESPIRATORY_TRACT | Qty: 3

## 2023-07-16 NOTE — Plan of Care (Signed)
 Problem: Discharge Planning  Goal: Discharge to home or other facility with appropriate resources  07/16/2023 1147 by Boykin Nearing, RN  Outcome: Progressing  07/15/2023 2302 by Waynetta Pean, RN  Outcome: Progressing  Flowsheets (Taken 07/15/2023 2001)  Discharge to home or other facility with appropriate resources: Identify discharge learning needs (meds, wound care, etc)     Problem: Safety - Adult  Goal: Free from fall injury  07/16/2023 1147 by Boykin Nearing, RN  Outcome: Progressing  07/15/2023 2302 by Waynetta Pean, RN  Outcome: Progressing     Problem: ABCDS Injury Assessment  Goal: Absence of physical injury  07/16/2023 1147 by Boykin Nearing, RN  Outcome: Progressing  07/15/2023 2302 by Waynetta Pean, RN  Outcome: Progressing     Problem: Respiratory - Adult  Goal: Achieves optimal ventilation and oxygenation  07/16/2023 1147 by Boykin Nearing, RN  Outcome: Progressing  07/15/2023 2302 by Waynetta Pean, RN  Outcome: Progressing     Problem: Skin/Tissue Integrity  Goal: Skin integrity remains intact  Description: 1.  Monitor for areas of redness and/or skin breakdown  2.  Assess vascular access sites hourly  3.  Every 4-6 hours minimum:  Change oxygen saturation probe site  4.  Every 4-6 hours:  If on nasal continuous positive airway pressure, respiratory therapy assess nares and determine need for appliance change or resting period  07/16/2023 1147 by Boykin Nearing, RN  Outcome: Progressing  07/15/2023 2302 by Waynetta Pean, RN  Outcome: Progressing

## 2023-07-16 NOTE — Progress Notes (Addendum)
 Hospitalist Progress Note   Admit Date:  07/04/2023 11:46 AM   Name:  Paul Medina   Age:  89 y.o.  Sex:  male  DOB:  December 05, 1930   MRN:  [id number]   Room:  357/01    Presenting/Chief Complaint: Shortness of Breath     Reason(s) for Admission: Severe sepsis (HCC) [A41.9, R65.20]  Pneumonia of right lung due to infectious organism, unspecified part of lung [J18.9]  Sepsis, due to unspecified organism, unspecified whether acute organ dysfunction present [facility]) [A41.9]     Hospital Course:   Paul Medina is a 89 y.o. male with chronic combined heart failure, PAF on Eliquis, HTN, CAD, history of TIA, left eye blindness, OSA on CPAP, and CKD stage III admitted on 2/11 due to severe sepsis and acute respiratory failure from bilateral pneumonia consistent with aspiration and hospital-acquired pneumonia.  He also had recently been diagnosed with COVID and treated.  Blood cultures NGTD  UA unremarkable. 2/11 CT chest with worsening right sided infiltrates and improving left sided infiltrates.    Subjective & 24hr Events:   He was laying comfortably on the bed.  He is very hard of hearing.Marland Kitchen He was tolerating diet well.   Denies chest pain.    Denies fevers.  Denies N/V.    Assessment & Plan:   Severe sepsis (HCC)    Bilateral pneumonia    Aspiration pneumonia (HCC)  Afebrile, leukocytosis normalized  Currently patient is saturating well on [facility]  Continue Augmentin 500 mg twice daily -EOT 2/20  Continue azithromycin 250 mg daily x 5 days-EOT 2/19        Acute respiratory failure with hypoxia  Continue Brovana 15 mcg twice daily, DuoNeb 4 times daily, Mucinex 600 mg twice daily      HTN (hypertension)  Continue Coreg 6.25 mg twice daily      Stage 3 chronic kidney disease (HCC)  Creatinine is back to baseline  1.4  Continue to hold Lasix  Check BMP daily      COVID  Patient diagnosed on 2/5  2/16 CRP 6.8 => not a candidate for baricitinib  Continue Decadron       Chronic combined systolic and diastolic heart failure  (HCC)  Euvolemic  Continue to hold Lasix      Paroxysmal atrial fibrillation (HCC)  Continue Coreg 6.25 mg twice daily, Eliquis 2.5 mg twice daily      Coronary atherosclerosis of native coronary artery      History of TIA (transient ischemic attack)      OSA (obstructive sleep apnea)  Continue CPAP at night      History of B-cell lymphoma      DNR (do not resuscitate)    Diarrhea      Dispo anticipate hospital stay for 24hrs  Anticipated Discharge Arrangements:   Home Health    PT/OT evals ordered?  Therapy evals ordered  Diet:  ADULT ORAL NUTRITION SUPPLEMENT; Breakfast, Lunch, Dinner; Educational psychologist Protein Oral Supplement  ADULT DIET; Dysphagia - Minced and Moist; Mildly Thick (Nectar); 1500 ml; No Drinking Straws  VTE prophylaxis: Already on anticoagulation  Code status: DNR      Non-peripheral Lines and Tubes (if present):          Telemetry (if present):  Cardiac/Telemetry Monitor On: No        Hospital Problems:  Principal Problem:    Severe sepsis (HCC)  Active Problems:    Chronic combined systolic and diastolic heart failure (HCC)  HTN (hypertension)    HLD (hyperlipidemia)    Coronary atherosclerosis of native coronary artery    History of TIA (transient ischemic attack)    OSA (obstructive sleep apnea)    Stage 3 chronic kidney disease (HCC)    COVID    Atrial fibrillation (HCC)    Bilateral pneumonia    Acute respiratory failure with hypoxia (HCC)    Aspiration pneumonia (HCC)    History of B-cell lymphoma    DNR (do not resuscitate)  Resolved Problems:    * No resolved hospital problems. *      Objective:   Patient Vitals for the past 24 hrs:   Temp Pulse Resp BP SpO2   07/16/23 0906 -- 68 -- (!) 148/74 --   07/16/23 0714 -- 69 20 -- 96 %   07/16/23 0710 97.3 F (36.3 C) 68 24 (!) 148/74 95 %   07/15/23 2044 -- 64 20 -- 98 %   07/15/23 1939 98.6 F (37 C) 70 20 108/63 94 %       Oxygen Therapy  SpO2: 96 %  Pulse via Oximetry: 83 beats per minute  Pulse Oximeter Device Mode:  Intermittent  Pulse Oximeter Device Location: Left, Finger  O2 Device: Nasal cannula  Skin Assessment: Clean, dry, & intact  FiO2 : 32 %  O2 Flow Rate (L/min): 2 L/min    Estimated body mass index is 29.05 kg/m as calculated from the following:    Height as of this encounter: 1.676 m (5\' 6" ).    Weight as of this encounter: 81.6 kg (180 lb).    Intake/Output Summary (Last 24 hours) at 07/16/2023 1236  Last data filed at 07/16/2023 0710  Gross per 24 hour   Intake --   Output 750 ml   Net -750 ml         Physical Exam:   General:    Appears worn out and frail  Head:  Normocephalic, atraumatic  Eyes:  Sclerae appear normal.  Pupils equally round.  ENT:  Nares appear normal.  Moist oral mucosa, Stridor is present  Neck:  No restricted ROM.  Trachea midline   CV:   RRR.  No m/r/g.  No jugular venous distension.  Lungs:   Bilateral scattered rhonchi with coarse wheezing.  Symmetric expansion.  Abdomen:   Soft, nontender, nondistended.  Extremities: No cyanosis or clubbing.  No edema  Skin:     No rashes.  Normal coloration.   Warm and dry.    Neuro:  CN II-XII grossly intact.    Psych:  Depressed affect    Imaging:    Chest X-Ray 2/11 personally reviewed and interpreted by me        I have personally reviewed labs and tests:  Recent Labs:  Recent Results (from the past 48 hour(s))   CBC with Auto Differential    Collection Time: 07/15/23  6:33 AM   Result Value Ref Range    WBC 8.5 4.3 - 11.1 K/uL    RBC 2.76 (L) 4.23 - 5.6 M/uL    Hemoglobin 8.0 (L) 13.6 - 17.2 g/dL    Hematocrit 16.1 (L) 41.1 - 50.3 %    MCV 86.2 82.0 - 102.0 FL    MCH 29.0 26.1 - 32.9 PG    MCHC 33.6 31.4 - 35.0 g/dL    RDW 09.6 (H) 04.5 - 14.6 %    Platelets 122 (L) 150 - 450 K/uL    MPV 12.7 (H) 9.4 - 12.3  FL    nRBC 0.00 0.0 - 0.2 K/uL    Neutrophils % 81.1 (H) 43.0 - 78.0 %    Lymphocytes % 4.8 (L) 13.0 - 44.0 %    Monocytes % 6.5 4.0 - 12.0 %    Eosinophils % 0.0 (L) 0.5 - 7.8 %    Basophils % 1.1 0.0 - 2.0 %    Immature Granulocytes % 6.5 (H) 0.0  - 5.0 %    Neutrophils Absolute 6.90 1.70 - 8.20 K/UL    Lymphocytes Absolute 0.41 (L) 0.50 - 4.60 K/UL    Monocytes Absolute 0.55 0.10 - 1.30 K/UL    Eosinophils Absolute 0.00 0.00 - 0.80 K/UL    Basophils Absolute 0.09 0.00 - 0.20 K/UL    Immature Granulocytes Absolute 0.55 (H) 0.0 - 0.5 K/UL    RBC Comment MODERATE  ANISOCYTOSIS + POIKILOCYTOSIS        RBC Comment SLIGHT  OVALOCYTES        RBC Comment SLIGHT  SCHISTOCYTES        RBC Comment SLIGHT  TEARDROP CELLS        WBC Comment Result Confirmed By Smear      Platelet Comment SLIGHT      Differential Type AUTOMATED     Basic Metabolic Panel    Collection Time: 07/15/23  6:33 AM   Result Value Ref Range    Sodium 135 (L) 136 - 145 mmol/L    Potassium 4.6 3.5 - 5.1 mmol/L    Chloride 101 98 - 107 mmol/L    CO2 22 20 - 29 mmol/L    Anion Gap 12 7 - 16 mmol/L    Glucose 97 70 - 99 mg/dL    BUN 44 (H) 8 - 23 MG/DL    Creatinine 0.27 (H) 0.80 - 1.30 MG/DL    Est, Glom Filt Rate 51 (L) >60 ml/min/1.27m2    Calcium 8.7 (L) 8.8 - 10.2 MG/DL   CBC with Auto Differential    Collection Time: 07/16/23  5:09 AM   Result Value Ref Range    WBC 8.9 4.3 - 11.1 K/uL    RBC 2.73 (L) 4.23 - 5.6 M/uL    Hemoglobin 8.1 (L) 13.6 - 17.2 g/dL    Hematocrit 25.3 (L) 41.1 - 50.3 %    MCV 87.2 82.0 - 102.0 FL    MCH 29.7 26.1 - 32.9 PG    MCHC 34.0 31.4 - 35.0 g/dL    RDW 66.4 (H) 40.3 - 14.6 %    Platelets 115 (L) 150 - 450 K/uL    MPV Unable to calculate. Recommend adding IPF. 9.4 - 12.3 FL    nRBC 0.00 0.0 - 0.2 K/uL    Neutrophils % 84.3 (H) 43.0 - 78.0 %    Lymphocytes % 4.6 (L) 13.0 - 44.0 %    Monocytes % 5.7 4.0 - 12.0 %    Eosinophils % 0.0 (L) 0.5 - 7.8 %    Basophils % 1.0 0.0 - 2.0 %    Immature Granulocytes % 4.4 0.0 - 5.0 %    Neutrophils Absolute 7.50 1.70 - 8.20 K/UL    Lymphocytes Absolute 0.41 (L) 0.50 - 4.60 K/UL    Monocytes Absolute 0.51 0.10 - 1.30 K/UL    Eosinophils Absolute 0.00 0.00 - 0.80 K/UL    Basophils Absolute 0.09 0.00 - 0.20 K/UL    Immature Granulocytes  Absolute 0.39 0.0 - 0.5 K/UL    Platelet Comment LARGE FORMS PRESENT  Differential Type AUTOMATED     Basic Metabolic Panel    Collection Time: 07/16/23  5:09 AM   Result Value Ref Range    Sodium 133 (L) 136 - 145 mmol/L    Potassium 5.0 3.5 - 5.1 mmol/L    Chloride 104 98 - 107 mmol/L    CO2 21 20 - 29 mmol/L    Anion Gap 8 7 - 16 mmol/L    Glucose 111 (H) 70 - 99 mg/dL    BUN 40 (H) 8 - 23 MG/DL    Creatinine 9.56 (H) 0.80 - 1.30 MG/DL    Est, Glom Filt Rate 48 (L) >60 ml/min/1.51m2    Calcium 8.6 (L) 8.8 - 10.2 MG/DL   Magnesium    Collection Time: 07/16/23  5:09 AM   Result Value Ref Range    Magnesium 2.3 1.8 - 2.4 mg/dL   Phosphorus    Collection Time: 07/16/23  5:09 AM   Result Value Ref Range    Phosphorus 3.8 2.5 - 4.5 MG/DL       No results for input(s): "COVID19" in the last 72 hours.    Current Meds:  Current Facility-Administered Medications   Medication Dose Route Frequency    apixaban (ELIQUIS) tablet 5 mg  5 mg Oral 2 times per day    lactobacillus (CULTURELLE) capsule 1 capsule  1 capsule Oral Daily with breakfast    prednisoLONE acetate (PRED FORTE) 1 % ophthalmic suspension 1 drop  1 drop Left Eye Daily    medicated lip ointment (BLISTEX)   Topical PRN    albuterol (PROVENTIL) (2.5 MG/3ML) 0.083% nebulizer solution 2.5 mg  2.5 mg Nebulization Q4H PRN    dexAMETHasone (DECADRON) tablet 6 mg  6 mg Oral Daily    ipratropium 0.5 mg-albuterol 2.5 mg (DUONEB) nebulizer solution 1 Dose  1 Dose Inhalation BID RT    nystatin (MYCOSTATIN) 100000 UNIT/ML suspension 500,000 Units  5 mL Oral 4x Daily    rosuvastatin (CRESTOR) tablet 10 mg  10 mg Oral Nightly    sodium chloride (Inhalant) 3 % nebulizer solution 4 mL  4 mL Nebulization PRN    arformoterol tartrate (BROVANA) nebulizer solution 15 mcg  15 mcg Nebulization BID RT    atropine 1 % ophthalmic solution 1 drop  1 drop Left Eye Nightly    brimonidine (ALPHAGAN) 0.2 % ophthalmic solution 1 drop  1 drop Left Eye TID    sodium chloride flush 0.9 %  injection 5-40 mL  5-40 mL IntraVENous 2 times per day    sodium chloride flush 0.9 % injection 5-40 mL  5-40 mL IntraVENous PRN    0.9 % sodium chloride infusion   IntraVENous PRN    potassium chloride (KLOR-CON M) extended release tablet 40 mEq  40 mEq Oral PRN    Or    potassium bicarb-citric acid (EFFER-K) effervescent tablet 40 mEq  40 mEq Oral PRN    Or    potassium chloride 10 mEq/100 mL IVPB (Peripheral Line)  10 mEq IntraVENous PRN    magnesium sulfate 2000 mg in 50 mL IVPB premix  2,000 mg IntraVENous PRN    ondansetron (ZOFRAN-ODT) disintegrating tablet 4 mg  4 mg Oral Q8H PRN    Or    ondansetron (ZOFRAN) injection 4 mg  4 mg IntraVENous Q6H PRN    polyethylene glycol (GLYCOLAX) packet 17 g  17 g Oral Daily PRN    acetaminophen (TYLENOL) tablet 650 mg  650 mg Oral Q6H PRN  Or    acetaminophen (TYLENOL) suppository 650 mg  650 mg Rectal Q6H PRN    guaiFENesin (MUCINEX) extended release tablet 600 mg  600 mg Oral BID    carvedilol (COREG) tablet 6.25 mg  6.25 mg Oral BID WC    doxazosin (CARDURA) tablet 4 mg  4 mg Oral Daily    pantoprazole (PROTONIX) tablet 40 mg  40 mg Oral QAM AC    [Held by provider] furosemide (LASIX) tablet 20 mg  20 mg Oral Daily       Signed:  Verdis Prime, MD    Part of this note may have been written by using a voice dictation software.  The note has been proof read but may still contain some grammatical/other typographical errors.

## 2023-07-16 NOTE — Progress Notes (Signed)
 ACUTE PHYSICAL THERAPY GOALS:   (Developed with and agreed upon by patient and/or caregiver.)      LTG:  (1.)Mr. Farnan will move from supine to sit and sit to supine  in bed with INDEPENDENT within 7 treatment day(s).    (2.)Mr. Lentz will transfer from bed to chair and chair to bed with SUPERVISION using the least restrictive device within 7 treatment day(s).    (3.)Mr. Kil will ambulate with SUPERVISION for 200 feet with the least restrictive device within 7 treatment day(s).  ________________________________________________________________________________________________      PHYSICAL THERAPY Daily Note and PM  (Link to Caseload Tracking: PT Visit Days : 1  Acknowledge Orders  Time In/Out  PT Charge Capture  Rehab Caseload Tracker    Nysir Fergusson is a 89 y.o. male   PRIMARY DIAGNOSIS: Severe sepsis (HCC)  Severe sepsis (HCC) [A41.9, R65.20]  Pneumonia of right lung due to infectious organism, unspecified part of lung [J18.9]  Sepsis, due to unspecified organism, unspecified whether acute organ dysfunction present (HCC) [A41.9]       Reason for Referral: Generalized Muscle Weakness (M62.81)  Difficulty in walking, Not elsewhere classified (R26.2)  Inpatient: Payor: [employer] / Plan: [employer] / Product Type: *No Product type* /     ASSESSMENT:     REHAB RECOMMENDATIONS:   Recommendation to date pending progress:  Setting:  Short-term Rehab    Equipment:    None     ASSESSMENT:  Mr. Jacome admitted with above diagnoses.  He presented to the ER with complaints of cough and shortness of breath.  CT showed worsening R-sided pneumonia and worsening mucus plugging.  Patient was hospitalized at [facility] 2/5-2/10 with a-fib and pneumonia.  Short term rehab was recommended during last admission but patient declined and d/c'd home with HH orders.  Patient reports he was told he would have to be in a facility for 2 weeks for rehab and he did not want that.  Explained to patient that there was no  way for [place] to know how long he would need to stay for rehab but he reports he still plans to return home with [facility].  Patient currently demonstrates decreased strength, balance, mobility, and tolerance for activity.  He would benefit from therapy to address these deficits.  He is on 3L O2 at this time but did not qualify for O2 2 days ago when he was d/c'd from [facility].  Patient educated on need for mobility/time out of bed to improve strength, mobility, and ability to safely return home.     2/23 ; Today pt was cooperative with all therapy activity noted. Pt worked on bed mobility, sitting balance, transfers & gait. Pt performed at a level that would be manageable for a caregiver in the home. Pt's daughter was present for session & she was advised on home safety & equipment needs. It appears that pt's spirits were lifted when he became aware of possibly returning home tomorrow. Need to practice progressive gait, likely a walking sat test with RT & single step stair ambulation. PT to continue POC.     [employer] AM-PACT "6 Clicks" Basic Mobility Inpatient Short Form  AM-PAC Basic Mobility - Inpatient   How much help is needed turning from your back to your side while in a flat bed without using bedrails?: A Little  How much help is needed moving from lying on your back to sitting on the side of a flat bed without using bedrails?: A Little  How much help is needed moving to and from a bed to a chair?: A Little  How much help is needed standing up from a chair using your arms?: A Little  How much help is needed walking in hospital room?: A Little  How much help is needed climbing 3-5 steps with a railing?: A Little  AM-PAC Inpatient Mobility Raw Score : 18  AM-PAC Inpatient T-Scale Score : 43.63  Mobility Inpatient CMS 0-100% Score: 46.58  Mobility Inpatient CMS G-Code Modifier : CK    SUBJECTIVE:   Mr. Neuman very agreeable to work with PT today      Social/Functional Lives With: Alone  Type of Home: Senior  housing apartment  Home Layout: One level  Home Access: Level entry, Occupational psychologist Shower/Tub: Pension scheme manager: Midwife: Grab bars in shower, Grab bars around toilet  Bathroom Accessibility: Accessible  Home Equipment: Engineer, production Help From: Family  Prior Level of Assist for ADLs: Independent  Prior Level of Assist for [employer]: Independent  Prior Level of Assist for Transfers: Independent  Occupation: Retired    OBJECTIVE:     PAIN: VITALS / O2: PRECAUTION / LINES / DRAINS:   Pre Treatment:  no complaints         Post Treatment: no complaints Vitals NT       Oxygen   2 liters NC   IV and 02    RESTRICTIONS/PRECAUTIONS:  Restrictions/Precautions: Isolation, Fall Risk, Bed Alarm                 GROSS EVALUATION:  Intact Impaired (Comments):   AROM []   Generally decreased,  but functional    PROM []     Strength []   Generally decreased, but functional    Balance []      Posture []  Forward Head  Rounded Shoulders  Thoracic Kyphosis   Sensation [x]   grossly   Coordination []    Generally decreased, but functional   Tone []   Hypotonus throughout   Edema []     Activity Tolerance []   limited    []       COGNITION/  PERCEPTION: Intact Impaired (Comments):   Orientation [x]      Vision [x]      Hearing []   B hearing aids   Cognition  [x]        MOBILITY: I Mod I S SBA CGA Min Mod Max Total  NT x2 Comments:   Bed Mobility    Rolling []  []  []  []  []  []  []  []  []  []  []     Supine to Sit []  []  []  [x]  []  []  []  []  []  []  []     Scooting []  []  []  [x]  []  []  []  []  []  []  []     Sit to Supine []  []  []  []  []  []  []  []  []  []  []     Transfers    Sit to Stand []  []  []  [x]  []  []  []  []  []  []  []     Bed to Chair []  []  []  [x]  []  []  []  []  []  []  []     Stand to Sit []  []  []  [x]  []  []  []  []  []  []  []     Toilet  []  []  []  []  []  [x]  []  []  []  []  []  Min A from low toilet   I=Independent, Mod I=Modified Independent, S=Supervision, SBA=Standby Assistance, CGA=Contact Guard Assistance,   Min=Minimal Assistance,  Mod=Moderate Assistance, Max=Maximal Assistance, Total=Total Assistance, NT=Not Tested    GAIT: I Mod I S SBA CGA Min Mod  Max Total  NT x2 Comments:   Level of Assistance []  []  []  [x]  []  []  []  []  []  []  []     Distance 20 (x 2) feet    DME Rolling Walker    Gait Quality Decreased cadence , Decreased step clearance, Decreased step length, and Trunk flexion    Weightbearing Status N/A    Stairs  NT    I=Independent, Mod I=Modified Independent, S=Supervision, SBA=Standby Assistance, CGA=Contact Guard Assistance,   Min=Minimal Assistance, Mod=Moderate Assistance, Max=Maximal Assistance, Total=Total Assistance, NT=Not Tested    PLAN:   FREQUENCY AND DURATION: Daily for duration of hospital stay or until stated goals are met, whichever comes first.    THERAPY PROGNOSIS: Good    PROBLEM LIST:   (Skilled intervention is medically necessary to address:)  Decreased ADL/Functional Activities  Decreased Activity Tolerance  Decreased AROM/PROM  Decreased Balance  Decreased Gait Ability  Decreased Strength  Decreased Transfer Abilities INTERVENTIONS PLANNED:   (Benefits and precautions of physical therapy have been discussed with the patient.)  Therapeutic Activity  Therapeutic Exercise/HEP  Gait Training  Education       TREATMENT:       TREATMENT:   Therapeutic Activity (25 Minutes): Therapeutic activity included review of expected activity for today, pt worked on bed mobility, sitting balance static & dynamic, tranfers, standing balance activity wt shifting with walker, progressive gait training x 2, bathroom transfer, prolonged standing balance for hygiene, cool down LE exercises a to improve functional Activity tolerance, Balance, Coordination, Mobility, Strength, and ROM.    TREATMENT GRID:  N/A    AFTER TREATMENT PRECAUTIONS: Alarm Activated, Bed/Chair Locked, Call light within reach, Chair, Needs within reach, RN notified, and Visitors at bedside    INTERDISCIPLINARY COLLABORATION:  RN/ PCT    EDUCATION:    Educated patient  and/or Geophysicist/field seismologist on the following: Assistive device indications/utilization/safety, Fall prevention strategies, Financial risk analyst, Home Exercise Program, and Precautions    TIME IN/OUT:  Time In: 1644  Time Out: 1709  Minutes: 25    Karesha Trzcinski M. Alanee Ting, PT

## 2023-07-16 NOTE — Progress Notes (Signed)
 Six minute walk has not been done.  Changes in Patient status have prevented him from being discharged.

## 2023-07-16 NOTE — Care Coordination-Inpatient (Signed)
 RNCM attempted to contact Eve [phone]), daughter of patient, via telephone. HIPAA compliant message left requesting call back to case management.

## 2023-07-17 ENCOUNTER — Inpatient Hospital Stay: Admit: 2023-07-17 | Payer: PRIVATE HEALTH INSURANCE | Primary: Diagnostic Radiology

## 2023-07-17 LAB — BASIC METABOLIC PANEL
Anion Gap: 12 mmol/L (ref 7–16)
BUN: 36 mg/dL — ABNORMAL HIGH (ref 8–23)
CO2: 21 mmol/L (ref 20–29)
Calcium: 9 mg/dL (ref 8.8–10.2)
Chloride: 103 mmol/L (ref 98–107)
Creatinine: 1.31 mg/dL — ABNORMAL HIGH (ref 0.80–1.30)
Est, Glom Filt Rate: 51 mL/min/{1.73_m2} — ABNORMAL LOW (ref >60)
Glucose: 99 mg/dL (ref 70–99)
Potassium: 5.2 mmol/L — ABNORMAL HIGH (ref 3.5–5.1)
Sodium: 136 mmol/L (ref 136–145)

## 2023-07-17 LAB — MAGNESIUM: Magnesium: 2.2 mg/dL (ref 1.8–2.4)

## 2023-07-17 LAB — PHOSPHORUS: Phosphorus: 3.7 mg/dL (ref 2.5–4.5)

## 2023-07-17 LAB — OVA AND PARASITE EXAMINATION

## 2023-07-17 LAB — OVA & PARASITES, STOOL, REFLEX

## 2023-07-17 MED ORDER — NYSTATIN 100000 UNIT/ML MT SUSP
100000 UNIT/ML | Freq: Four times a day (QID) | OROMUCOSAL | Status: AC
Start: 2023-07-17 — End: 2023-07-21
  Administered 2023-07-17 – 2023-07-21 (×17): 500000 mL via ORAL

## 2023-07-17 MED ORDER — MAGIC MOUTHWASH WITHOUT NYSTATIN SIMPLE
Freq: Four times a day (QID) | Status: AC | PRN
Start: 2023-07-17 — End: 2023-07-21

## 2023-07-17 MED ORDER — BIOTENE DRY MOUTH MOISTURIZING MT SOLN
Freq: Three times a day (TID) | OROMUCOSAL | Status: AC | PRN
Start: 2023-07-17 — End: 2023-07-21

## 2023-07-17 MED ORDER — FUROSEMIDE 10 MG/ML IJ SOLN
10 | Freq: Once | INTRAMUSCULAR | Status: AC
Start: 2023-07-17 — End: 2023-07-17
  Administered 2023-07-17: 19:00:00 20 mg via INTRAVENOUS

## 2023-07-17 MED FILL — NYSTATIN 100000 UNIT/ML MT SUSP: 100000 UNIT/ML | OROMUCOSAL | Qty: 5

## 2023-07-17 MED FILL — CARVEDILOL 6.25 MG PO TABS: 6.25 MG | ORAL | Qty: 1

## 2023-07-17 MED FILL — MOUTH KOTE MT SOLN: OROMUCOSAL | Qty: 59

## 2023-07-17 MED FILL — ARFORMOTEROL TARTRATE 15 MCG/2ML IN NEBU: 152 MCG/2ML | RESPIRATORY_TRACT | Qty: 2

## 2023-07-17 MED FILL — ELIQUIS 5 MG PO TABS: 5 MG | ORAL | Qty: 1

## 2023-07-17 MED FILL — GUAIFENESIN ER 600 MG PO TB12: 600 MG | ORAL | Qty: 1

## 2023-07-17 MED FILL — MAGIC MOUTHWASH WITHOUT NYSTATIN SIMPLE: Qty: 10

## 2023-07-17 MED FILL — DOXAZOSIN MESYLATE 4 MG PO TABS: 4 MG | ORAL | Qty: 1

## 2023-07-17 MED FILL — FUROSEMIDE 10 MG/ML IJ SOLN: 10 MG/ML | INTRAMUSCULAR | Qty: 2

## 2023-07-17 MED FILL — ROSUVASTATIN CALCIUM 5 MG PO TABS: 5 MG | ORAL | Qty: 2

## 2023-07-17 MED FILL — ALBUTEROL SULFATE (2.5 MG/3ML) 0.083% IN NEBU: RESPIRATORY_TRACT | Qty: 3

## 2023-07-17 MED FILL — IPRATROPIUM-ALBUTEROL 0.5-2.5 (3) MG/3ML IN SOLN: 0.5-2.533 (3) MG/3ML | RESPIRATORY_TRACT | Qty: 3

## 2023-07-17 MED FILL — CULTURELLE PO CAPS: ORAL | Qty: 1

## 2023-07-17 MED FILL — PANTOPRAZOLE SODIUM 40 MG PO TBEC: 40 MG | ORAL | Qty: 1

## 2023-07-17 MED FILL — DEXAMETHASONE 4 MG PO TABS: 4 MG | ORAL | Qty: 2

## 2023-07-17 NOTE — Progress Notes (Signed)
 Nutrition Assessment  Assessment Type: Reassess  Reason for visit:  Best Practice Alert: Malnutrition Screening Tool   Malnutrition Screening Tool Score: 2    Nutrition Intervention:   Food and/or Nutrient Delivery:   Meals and Snacks:  Diet: Continue current diet per SLP evaluation  Medical Food Supplements:   Medical food supplement therapy:  Initiate Magic Cup (frozen oral supplement) 290 calories, 9 grams protein per 4 ounce serving. Specify strawberry or chocolate.       Malnutrition Assessment:  Academy/A.S.P.E.N Clinical Malnutrition Criteria  Malnutrition Status: At risk for malnutrition (Decreased intake d/t dyspnea, recent admission with use of ONS)    Nutrition Focused Physical Exam: Unremarkable  (2/6 RD encounter)    Nutrition Assessment:  Food/Nutrition Related History: Per prior RD encounter 2/6: Pt provides the following nutrition hx: Notes that for the past 10 days, his appetite has been limited. Eating a small breakfast of oatmeal or cereal in the morning. Drinking fluids throughout the day. Occasionally taking soups in the afternoon. Did not take ONS at home. Does endorse needing soft foods at baseline due to esophageal dysphagia. Notes his esophagus is "bent". Per chart review; patient has received several dilations at the [place].  Last known EGD: 06/28/19- Spastic esophagus; Presbyesophagus; Nodular erythema at gastric cardia  2/13: Intake average was 50-75% for 3 recorded meals for last 4 days of prior admission. Pt c/o being too "winded" to talk at length.       Weight History: Limited OV weights available in EMR: 10/24/22: 170# (urology), 02/28/23: 179# (Onc), 05/11/23:184# (pulm)  CBW: 181# (06/29/23 bed scale)  2/6: Overall slight tend up in weight over the past year. Pt reports this is because he has is now at a senior living facility and the meals are included. Notes that he now eats much more at baseline than he used to and has gained weight.   Nutrition Background:       PMH: asthma, HTN, OSA,  TIA, GERD, blind in L eye, CKD3, TIA, B-cell lymphoma, CAD, CHF.  Recent admission 2/5 to 2/10 with COVID PNA, new Afib  Presented back with cough and persistent dyspnea, not eating or drinking  Admitted with sepsis and PNA. Developed oral thrush and diarrhea  Nutrition Monitoring/Evaluation:  SLP eval: 2/7 -MM5, OK to upgrade to SB upon pt request, 2/12-regular, 2/13-MBS, MM5 with thin liquids, no straw, 2/19:  changed to mildly thick, no straw, 2/20: May have thin water via cup between meals  GI resulted for dysphagia, pt declined EGD 2/24. Was changed to NPO status in anticipation of EGD and diet has since been reordered. Pt did not receive lunch tray d/t active NPO order at that time.  Pt seen sitting in bed with eyes closed. No lunch tray in room. Discussed with Octavio Graves, RN. She reports pt was supposed to be discharged yesterday but delayed d/t pt agreed to EGD as of yesterday. Expect pt to discharge today since EGD not to be done today.   Ensure PNS no longer appropriate with change to mildly thick liquids.  Current Nutrition Therapies:  ADULT DIET; Dysphagia - Minced and Moist; Mildly Thick (Nectar)    Current Intake:   Average Meal Intake: 51-75% (for 12 recorded meals in past 7 days) Average Supplements Intake: Unable to assess      Anthropometric Measures:  Height: 167.6 cm (5\' 6" )  Current Body Wt: 81.6 kg (179 lb 14.3 oz) (2/11), Weight source: Stated  BMI: 29, Overweight (BMI 25.0-29.9)  Admission Body Weight:  81.6 kg (179 lb 14.3 oz) (stated)  Ideal Body Weight (Kg) (Calculated): 65 kg (142 lbs),    BMI Category Overweight (BMI 25.0-29.9)    Comparative Standards:  Energy (kcal/day): 9147-8295 (18-22 kcal/kg) (Kcal/kg Weight used: 81.6 kg Admission  Protein (g/day): 65-82 (0.8-1 g/kg) Weight Used: (Admission) 81.6 kg  Fluid (ml/day):   (1 ml/kcal)    Nutrition Diagnosis:   Inadequate oral intake related to decreased appetite, swallowing difficulty as evidenced by  (modified diet per SLP, intake as  above)    Nutrition Goal(s):   Previous Goal Met: Goal(s) Achieved  Active Goal: Meet at least 75% of estimated needs, by next RD assessment  Type of Goal: Continue current goal    Discharge Planning:    Continue Oral Nutrition Supplement    Sie Formisano, RD

## 2023-07-17 NOTE — Progress Notes (Signed)
 SPEECH LANGUAGE PATHOLOGY: ATTEMPT     NAME: Paul Medina  DOB: [birth year]  MRN: [id number]    ADMISSION DATE: 07/04/2023  PRIMARY DIAGNOSIS: Severe sepsis (HCC)    Speech Therapy attempted. Discussed with [name], RN. Per GI note, patient declined EGD, but there is a question now about whether this will be reconsidered. RN requests to hold PO for now. Will follow up at a later date as schedule permits and patient appropriate to be seen.     [name], SLP  07/17/2023 12:20 PM

## 2023-07-17 NOTE — Progress Notes (Signed)
 GI NOTE    Spoke to the patient and was also informed that he is interested in moving forward with an EGD tomorrow with possible dilation for his dysphagia. Case planned for around 12pm based on schedule. Continue with dysphagia diet as tolerated, aspiration precautions, and NPO after MN.     Thank you for involving the GI service in the care of your patient. Please dont hesitate to call me directly on my cell below with any questions, updates, etc.      -Glennie Isle, MD  [phone]   Gastroenterology/Hepatology

## 2023-07-17 NOTE — Care Coordination-Inpatient (Signed)
 Pt chart reviewed and discussed in AM IDR for continued stay. LOS 13 days. Pt admitted with severe sepsis and pneumonia of right lung due to infectious organism. Per MD, GI consulted; pt refused EGD today; EGD scheduled for tomorrow; pulmonology consulted. Pt not medically stable for discharge.      DC/POC to return to daughter's home with resumption of [facility] PT/OT/RN/SLP/aide services.    CM team will continue to follow for potential discharge needs that may arise.     Gardiner Fanti, MSW, LBSW  Social Work Case Software engineer. [place]

## 2023-07-17 NOTE — Consults (Signed)
 Paul Medina is 89 y.o. y/o male     Patient is a 89 year old male, with a PMHx of CHF, HTN, TIA, CKD Stage 3, AFIB, B-cell lymphoma, who has been admitted since 2/11 (?) for sepsis, acute respiratory failure due to BL PNA.    We were consulted back on 2/7 for dysphagia during admission for AFIB RVR, CHF, AKI, COVID. At the time, patient was complaining more of a dry mouth, thus leading to dysphagia. Has had multiple EGD with dilation in the past with no relief; stating that he has a "Bend" in his esophagus. His last EGD was in 2021 that showed spastic esophagus, presbyesophagus, erythema of cardia and esophageal plaques (biopsies showing fungal esophagitis).    No plans for EGD on last evaluation; recommend SLP evaluate, moist diet, and mouth kotes/lemon/mint products to aid in saliva production. Seen by SLP and approved for mince/moist diet and mildly thickened liquids.     Today: Reconsulted for persistent dysphagia; patient states that he still has a dry mouth, but able to tolerate soft/moist diet if he takes sips of juices/liquids with it. No vomiting/regurgitation. Unclear if he received mouth kotes as it is no longer on his medication list.    **Offered repeat EGD, patient declined as he felt this won't help him given his history.       PE:   Vitals:    07/17/23 0747   BP: 111/62   Pulse: 74   Resp: 21   Temp: 97.8 F (36.6 C)   SpO2: 94%      General:  The patient appears well-nourished, and is in no acute distress.    HEENT:  Normocephalic, atraumatic. No sclerae icterus.   Neurologic:  Alert and oriented x3.  Psychiatric: Appropriate mood and affect.    Assessment and Plan:   #Dysphagia/Xerostomia: Consulted this admission for persistent dysphagia secondary to dry mouth; patient actually feels that is he able to tolerate his diet a little better if moist and when drinking liquids when eating.     Offered repeat EGD for evaluation, but he declined as he does not feel it will help him. Continue with  mince/moist diet. Will reorder mouth kotes as needed. Encourage use of lemon drops (if family is able to bring) to aid in saliva production.    No further recommendations from GI; will sign off at this time. Please reconsult as needed.     Electronically signed by Su Monks, PA-C.

## 2023-07-17 NOTE — Plan of Care (Signed)
 Problem: Discharge Planning  Goal: Discharge to home or other facility with appropriate resources  Outcome: Progressing     Problem: Safety - Adult  Goal: Free from fall injury  Outcome: Progressing     Problem: ABCDS Injury Assessment  Goal: Absence of physical injury  Outcome: Progressing     Problem: Respiratory - Adult  Goal: Achieves optimal ventilation and oxygenation  Outcome: Progressing     Problem: Skin/Tissue Integrity  Goal: Skin integrity remains intact  Description: 1.  Monitor for areas of redness and/or skin breakdown  2.  Assess vascular access sites hourly  3.  Every 4-6 hours minimum:  Change oxygen saturation probe site  4.  Every 4-6 hours:  If on nasal continuous positive airway pressure, respiratory therapy assess nares and determine need for appliance change or resting period  Outcome: Progressing

## 2023-07-17 NOTE — Progress Notes (Signed)
 ACUTE PHYSICAL THERAPY GOALS:   (Developed with and agreed upon by patient and/or caregiver.)      LTG:  (1.)Paul Medina will move from supine to sit and sit to supine  in bed with INDEPENDENT within 7 treatment day(s).    (2.)Paul Medina will transfer from bed to chair and chair to bed with SUPERVISION using the least restrictive device within 7 treatment day(s).    (3.)Paul Medina will ambulate with SUPERVISION for 200 feet with the least restrictive device within 7 treatment day(s).  ________________________________________________________________________________________________      PHYSICAL THERAPY Daily Note and PM  (Link to Caseload Tracking: PT Visit Days : 2  Acknowledge Orders  Time In/Out  PT Charge Capture  Rehab Caseload Tracker    Paul Medina is a 89 y.o. male   PRIMARY DIAGNOSIS: Severe sepsis (HCC)  Severe sepsis (HCC) [A41.9, R65.20]  Pneumonia of right lung due to infectious organism, unspecified part of lung [J18.9]  Sepsis, due to unspecified organism, unspecified whether acute organ dysfunction present (HCC) [A41.9]  Procedure(s) (LRB):  ESOPHAGOGASTRODUODENOSCOPY (N/A)     Reason for Referral: Generalized Muscle Weakness (M62.81)  Difficulty in walking, Not elsewhere classified (R26.2)  Inpatient: Payor: [employer] / Plan: [employer] / Product Type: *No Product type* /     ASSESSMENT:     REHAB RECOMMENDATIONS:   Recommendation to date pending progress:  Setting:  Short-term Rehab  Pt said he is going home with daughter    Equipment:    None     ASSESSMENT:  Paul Medina admitted with above diagnoses.  He presented to the ER with complaints of cough and shortness of breath.  CT showed worsening R-sided pneumonia and worsening mucus plugging.  Patient was hospitalized at [facility] 2/5-2/10 with a-fib and pneumonia.  Short term rehab was recommended during last admission but patient declined and d/c'd home with HH orders.  Patient reports he was told he would have to be in a  facility for 2 weeks for rehab and he did not want that.  Explained to patient that there was no way for [place] to know how long he would need to stay for rehab but he reports he still plans to return home with [facility].  Patient currently demonstrates decreased strength, balance, mobility, and tolerance for activity.  He would benefit from therapy to address these deficits.  He is on 3L O2 at this time but did not qualify for O2 2 days ago when he was d/c'd from [facility].  Patient educated on need for mobility/time out of bed to improve strength, mobility, and ability to safely return home.       2/24   sitting on eob upon arrival.  sat on eob few min, because he could not breath.  Work on Electronics engineer without support.  Pt feels like he as got his breath, enough to walk.  Sit>stand @ walker with CGA and stood few min.  Ambulated 30 ft using RW with CGA and verbal cues to slow down and stand tall.  Remain in the chair with needs in reach, alarm on and instructed to call for assist, before getting up. Pt said he  is going home with daughter,     Lupita Shutter AM-PACT "6 Clicks" Basic Mobility Inpatient Short Form  AM-PAC Basic Mobility - Inpatient   How much help is needed turning from your back to your side while in a flat bed without using bedrails?: A Little  How much help is needed moving  from lying on your back to sitting on the side of a flat bed without using bedrails?: A Little  How much help is needed moving to and from a bed to a chair?: A Little  How much help is needed standing up from a chair using your arms?: A Little  How much help is needed walking in hospital room?: A Little  How much help is needed climbing 3-5 steps with a railing?: A Little  AM-PAC Inpatient Mobility Raw Score : 18  AM-PAC Inpatient T-Scale Score : 43.63  Mobility Inpatient CMS 0-100% Score: 46.58  Mobility Inpatient CMS G-Code Modifier : CK    SUBJECTIVE:   Paul Medina agreeable    Social/Functional Lives With: Alone  Type of  Home: Senior housing apartment  Home Layout: One level  Home Access: Level entry, Occupational psychologist Shower/Tub: Pension scheme manager: Midwife: Grab bars in shower, Grab bars around toilet  Bathroom Accessibility: Accessible  Home Equipment: Engineer, production Help From: Family  Prior Level of Assist for ADLs: Independent  Prior Level of Assist for [employer]: Independent  Prior Level of Assist for Transfers: Independent  Occupation: Retired    OBJECTIVE:     PAIN: VITALS / O2: PRECAUTION / LINES / DRAINS:   Pre Treatment:  no complaints         Post Treatment: no complaints Vitals NT       Oxygen   2 liters NC   IV and 02    RESTRICTIONS/PRECAUTIONS:  Restrictions/Precautions: Isolation, Fall Risk, Bed Alarm                 GROSS EVALUATION:  Intact Impaired (Comments):   AROM []   Generally decreased,  but functional    PROM []     Strength []   Generally decreased, but functional    Balance []      Posture []  Forward Head  Rounded Shoulders  Thoracic Kyphosis   Sensation [x]   grossly   Coordination []    Generally decreased, but functional   Tone []   Hypotonus throughout   Edema []     Activity Tolerance []   limited    []       COGNITION/  PERCEPTION: Intact Impaired (Comments):   Orientation [x]      Vision [x]      Hearing []   B hearing aids   Cognition  [x]        MOBILITY: I Mod I S SBA CGA Min Mod Max Total  NT x2 Comments:   Bed Mobility    Rolling []  []  []  []  []  []  []  []  []  []  []     Supine to Sit []  []  []  [x]  []  []  []  []  []  []  []     Scooting []  []  []  [x]  []  []  []  []  []  []  []     Sit to Supine []  []  []  []  []  []  []  []  []  []  []     Transfers    Sit to Stand []  []  []  [x]  []  []  []  []  []  []  []     Bed to Chair []  []  []  [x]  []  []  []  []  []  []  []     Stand to Sit []  []  []  [x]  []  []  []  []  []  []  []     Toilet  []  []  []  []  []  [x]  []  []  []  []  []  Min A from low toilet   I=Independent, Mod I=Modified Independent, S=Supervision, SBA=Standby Assistance, CGA=Contact Guard Assistance,   Min=Minimal  Assistance, Mod=Moderate Assistance, Max=Maximal Assistance, Total=Total Assistance, NT=Not Tested  GAIT: I Mod I S SBA CGA Min Mod Max Total  NT x2 Comments:   Level of Assistance []  []  []  [x]  []  []  []  []  []  []  []     Distance 30 feet    DME Rolling Walker    Gait Quality Decreased cadence , Decreased step clearance, Decreased step length, and Trunk flexion    Weightbearing Status N/A    Stairs  NT    I=Independent, Mod I=Modified Independent, S=Supervision, SBA=Standby Assistance, CGA=Contact Guard Assistance,   Min=Minimal Assistance, Mod=Moderate Assistance, Max=Maximal Assistance, Total=Total Assistance, NT=Not Tested    PLAN:   FREQUENCY AND DURATION: Daily for duration of hospital stay or until stated goals are met, whichever comes first.    THERAPY PROGNOSIS: Good    PROBLEM LIST:   (Skilled intervention is medically necessary to address:)  Decreased ADL/Functional Activities  Decreased Activity Tolerance  Decreased AROM/PROM  Decreased Balance  Decreased Gait Ability  Decreased Strength  Decreased Transfer Abilities INTERVENTIONS PLANNED:   (Benefits and precautions of physical therapy have been discussed with the patient.)  Therapeutic Activity  Therapeutic Exercise/HEP  Gait Training  Education       TREATMENT:       TREATMENT:   Therapeutic Activity (25 Minutes): Therapeutic activity included Ambulation on level ground, Sitting balance , and Standing balance to improve functional Activity tolerance, Balance, Coordination, Mobility, and Strength.    TREATMENT GRID:  N/A    AFTER TREATMENT PRECAUTIONS: Alarm Activated, Bed/Chair Locked, Call light within reach, Chair, Needs within reach, RN notified, and Visitors at bedside    INTERDISCIPLINARY COLLABORATION:  RN/ PCT    EDUCATION:    Educated patient and/or Geophysicist/field seismologist on the following: Assistive device indications/utilization/safety, Fall prevention strategies, Financial risk analyst, Home Exercise Program, and Precautions    TIME IN/OUT:  Time In: 1400  Time  Out: 1425  Minutes: 25    Mickal Meno C Finnegan Gatta, PTA

## 2023-07-17 NOTE — Progress Notes (Signed)
 Occupational and Physical Therapy Note:    Attempted to see patient this AM for therapy treatment  session. Patient was in bed. He had just returned from ambulating to the restroom with CNA. He requested a rest break. O2 sat at 94% on supplemental O2. Will follow and re-attempt as schedule permits/patient available. Thank you,    Hilario Quarry, OT    Rehab Caseload Tracker

## 2023-07-17 NOTE — Plan of Care (Signed)
 Problem: Skin/Tissue Integrity  Goal: Skin integrity remains intact  Description: 1.  Monitor for areas of redness and/or skin breakdown  2.  Assess vascular access sites hourly  3.  Every 4-6 hours minimum:  Change oxygen saturation probe site  4.  Every 4-6 hours:  If on nasal continuous positive airway pressure, respiratory therapy assess nares and determine need for appliance change or resting period  Outcome: Progressing     Problem: Respiratory - Adult  Goal: Achieves optimal ventilation and oxygenation  Outcome: Progressing     Problem: ABCDS Injury Assessment  Goal: Absence of physical injury  Outcome: Progressing     Problem: Safety - Adult  Goal: Free from fall injury  Outcome: Progressing     Problem: Discharge Planning  Goal: Discharge to home or other facility with appropriate resources  Outcome: Progressing

## 2023-07-17 NOTE — Progress Notes (Addendum)
 Hospitalist Progress Note   Admit Date:  07/04/2023 11:46 AM   Name:  Paul Medina   Age:  89 y.o.  Sex:  male  DOB:  20-Mar-1931   MRN:  [id number]   Room:  357/01    Presenting/Chief Complaint: Shortness of Breath     Reason(s) for Admission: Severe sepsis (HCC) [A41.9, R65.20]  Pneumonia of right lung due to infectious organism, unspecified part of lung [J18.9]  Sepsis, due to unspecified organism, unspecified whether acute organ dysfunction present [facility]) [A41.9]     Hospital Course:   Paul Medina is a 89 y.o. male with chronic combined heart failure, PAF on Eliquis, HTN, CAD, history of TIA, left eye blindness, OSA on CPAP, and CKD stage III admitted on 2/11 due to severe sepsis and acute respiratory failure from bilateral pneumonia consistent with aspiration and hospital-acquired pneumonia.  He also had recently been diagnosed with COVID and treated.  Blood cultures NGTD  UA unremarkable. 2/11 CT chest with worsening right sided infiltrates and improving left sided infiltrates.    Subjective & 24hr Events:   He was laying comfortably on the bed.  He is very hard of hearing.Marland Kitchen He was tolerating diet well.  He was wheezing.   Denies chest pain.    Denies fevers.  Denies N/V.    Assessment & Plan:   Severe sepsis (HCC)    Bilateral pneumonia    Aspiration pneumonia (HCC)  Afebrile, leukocytosis normalized  Currently patient is saturating well on [facility]  Completed Augmentin 500 mg twice daily -and azithromycin 250 mg daily x 5 days-EOT 2/19    Dysphagia  Follow-up with SLP  Patient refused EGD  GI signed off, appreciate recommendations      Acute respiratory failure with hypoxia  Continue Brovana 15 mcg twice daily, DuoNeb 4 times daily, Mucinex 600 mg twice daily      HTN (hypertension)  Continue Coreg 6.25 mg twice daily      Stage 3 chronic kidney disease (HCC)  Creatinine is back to baseline  1.4  Check BMP daily      COVID  Patient diagnosed on 2/5  2/16 CRP 6.8 => not a candidate for  baricitinib  Continue Decadron       Chronic combined systolic and diastolic heart failure (HCC)  Euvolemic  Resumed Lasix      Paroxysmal atrial fibrillation (HCC)  Continue Coreg 6.25 mg twice daily, Eliquis 2.5 mg twice daily    Mucous plugging  Follow-up CT chest  Pulmonology consulted, appreciate recommendations      Coronary atherosclerosis of native coronary artery      History of TIA (transient ischemic attack)      OSA (obstructive sleep apnea)  Continue CPAP at night      History of B-cell lymphoma      DNR (do not resuscitate)    Diarrhea resolved    Mouth sores  Added nystatin and Magic wash      Dispo anticipate hospital stay for 24hrs  Anticipated Discharge Arrangements:   Home Health    PT/OT evals ordered?  Therapy evals ordered  Diet:  ADULT DIET; Dysphagia - Minced and Moist; Mildly Thick (Nectar)  ADULT ORAL NUTRITION SUPPLEMENT; Breakfast, Lunch, Dinner; Frozen Oral Supplement  VTE prophylaxis: Already on anticoagulation  Code status: DNR      Non-peripheral Lines and Tubes (if present):          Telemetry (if present):  Cardiac/Telemetry Monitor On: No  Hospital Problems:  Principal Problem:    Severe sepsis (HCC)  Active Problems:    Chronic combined systolic and diastolic heart failure (HCC)    HTN (hypertension)    HLD (hyperlipidemia)    Coronary atherosclerosis of native coronary artery    History of TIA (transient ischemic attack)    OSA (obstructive sleep apnea)    Stage 3 chronic kidney disease (HCC)    COVID    Atrial fibrillation (HCC)    Bilateral pneumonia    Acute respiratory failure with hypoxia (HCC)    Aspiration pneumonia (HCC)    History of B-cell lymphoma    DNR (do not resuscitate)    Oropharyngeal dysphagia  Resolved Problems:    * No resolved hospital problems. *      Objective:   Patient Vitals for the past 24 hrs:   Temp Pulse Resp BP SpO2   07/17/23 0923 -- -- -- 111/62 --   07/17/23 0904 -- 87 18 -- 95 %   07/17/23 0747 97.8 F (36.6 C) 74 21 111/62 94 %    07/17/23 0652 -- 74 22 -- 94 %   07/16/23 2128 -- 71 20 -- 98 %   07/16/23 1949 97.9 F (36.6 C) 70 18 107/62 99 %   07/16/23 1624 -- 74 -- 113/72 --       Oxygen Therapy  SpO2: 95 %  Pulse via Oximetry: 83 beats per minute  Pulse Oximeter Device Mode: Intermittent  Pulse Oximeter Device Location: Finger  O2 Device: Nasal cannula  Skin Assessment: Clean, dry, & intact  FiO2 : 32 %  O2 Flow Rate (L/min): 2 L/min    Estimated body mass index is 29.05 kg/m as calculated from the following:    Height as of this encounter: 1.676 m (5\' 6" ).    Weight as of this encounter: 81.6 kg (180 lb).    Intake/Output Summary (Last 24 hours) at 07/17/2023 1409  Last data filed at 07/17/2023 1118  Gross per 24 hour   Intake 120 ml   Output 1050 ml   Net -930 ml         Physical Exam:   General:    Appears worn out and frail  Head:  Normocephalic, atraumatic, sores in the mouth are present  Eyes:  Sclerae appear normal.  Pupils equally round.  ENT:  Nares appear normal.  Moist oral mucosa, Stridor is present  Neck:  No restricted ROM.  Trachea midline   CV:   RRR.  No m/r/g.  No jugular venous distension.  Lungs:   Bilateral scattered rhonchi with coarse wheezing.  Symmetric expansion.  Increased work of breathing and accessory muscle use.  Abdomen:   Soft, nontender, nondistended.  Extremities: No cyanosis or clubbing.  No edema  Skin:     No rashes.  Normal coloration.   Warm and dry.    Neuro:  CN II-XII grossly intact.    Psych:  Depressed affect    Imaging:    Chest X-Ray 2/11 personally reviewed and interpreted by me        I have personally reviewed labs and tests:  Recent Labs:  Recent Results (from the past 48 hour(s))   CBC with Auto Differential    Collection Time: 07/16/23  5:09 AM   Result Value Ref Range    WBC 8.9 4.3 - 11.1 K/uL    RBC 2.73 (L) 4.23 - 5.6 M/uL    Hemoglobin 8.1 (L) 13.6 - 17.2 g/dL  Hematocrit 23.8 (L) 41.1 - 50.3 %    MCV 87.2 82.0 - 102.0 FL    MCH 29.7 26.1 - 32.9 PG    MCHC 34.0 31.4 - 35.0 g/dL     RDW 16.1 (H) 09.6 - 14.6 %    Platelets 115 (L) 150 - 450 K/uL    MPV Unable to calculate. Recommend adding IPF. 9.4 - 12.3 FL    nRBC 0.00 0.0 - 0.2 K/uL    Neutrophils % 84.3 (H) 43.0 - 78.0 %    Lymphocytes % 4.6 (L) 13.0 - 44.0 %    Monocytes % 5.7 4.0 - 12.0 %    Eosinophils % 0.0 (L) 0.5 - 7.8 %    Basophils % 1.0 0.0 - 2.0 %    Immature Granulocytes % 4.4 0.0 - 5.0 %    Neutrophils Absolute 7.50 1.70 - 8.20 K/UL    Lymphocytes Absolute 0.41 (L) 0.50 - 4.60 K/UL    Monocytes Absolute 0.51 0.10 - 1.30 K/UL    Eosinophils Absolute 0.00 0.00 - 0.80 K/UL    Basophils Absolute 0.09 0.00 - 0.20 K/UL    Immature Granulocytes Absolute 0.39 0.0 - 0.5 K/UL    Platelet Comment LARGE FORMS PRESENT      Differential Type AUTOMATED     Basic Metabolic Panel    Collection Time: 07/16/23  5:09 AM   Result Value Ref Range    Sodium 133 (L) 136 - 145 mmol/L    Potassium 5.0 3.5 - 5.1 mmol/L    Chloride 104 98 - 107 mmol/L    CO2 21 20 - 29 mmol/L    Anion Gap 8 7 - 16 mmol/L    Glucose 111 (H) 70 - 99 mg/dL    BUN 40 (H) 8 - 23 MG/DL    Creatinine 0.45 (H) 0.80 - 1.30 MG/DL    Est, Glom Filt Rate 48 (L) >60 ml/min/1.4m2    Calcium 8.6 (L) 8.8 - 10.2 MG/DL   Magnesium    Collection Time: 07/16/23  5:09 AM   Result Value Ref Range    Magnesium 2.3 1.8 - 2.4 mg/dL   Phosphorus    Collection Time: 07/16/23  5:09 AM   Result Value Ref Range    Phosphorus 3.8 2.5 - 4.5 MG/DL   Magnesium    Collection Time: 07/17/23  6:42 AM   Result Value Ref Range    Magnesium 2.2 1.8 - 2.4 mg/dL   Phosphorus    Collection Time: 07/17/23  6:42 AM   Result Value Ref Range    Phosphorus 3.7 2.5 - 4.5 MG/DL   Basic Metabolic Panel    Collection Time: 07/17/23  6:42 AM   Result Value Ref Range    Sodium 136 136 - 145 mmol/L    Potassium 5.2 (H) 3.5 - 5.1 mmol/L    Chloride 103 98 - 107 mmol/L    CO2 21 20 - 29 mmol/L    Anion Gap 12 7 - 16 mmol/L    Glucose 99 70 - 99 mg/dL    BUN 36 (H) 8 - 23 MG/DL    Creatinine 4.09 (H) 0.80 - 1.30 MG/DL    Est,  Glom Filt Rate 51 (L) >60 ml/min/1.25m2    Calcium 9.0 8.8 - 10.2 MG/DL       No results for input(s): "COVID19" in the last 72 hours.    Current Meds:  Current Facility-Administered Medications   Medication Dose Route Frequency    saliva  substitute (BIOTENE/MOUTH KOTE) liquid   Oral TID PRN    magic (miracle) mouthwash  10 mL Swish & Spit 4x Daily PRN    nystatin (MYCOSTATIN) 100000 UNIT/ML suspension 500,000 Units  5 mL Oral 4x Daily    apixaban (ELIQUIS) tablet 5 mg  5 mg Oral 2 times per day    lactobacillus (CULTURELLE) capsule 1 capsule  1 capsule Oral Daily with breakfast    prednisoLONE acetate (PRED FORTE) 1 % ophthalmic suspension 1 drop  1 drop Left Eye Daily    medicated lip ointment (BLISTEX)   Topical PRN    albuterol (PROVENTIL) (2.5 MG/3ML) 0.083% nebulizer solution 2.5 mg  2.5 mg Nebulization Q4H PRN    dexAMETHasone (DECADRON) tablet 6 mg  6 mg Oral Daily    ipratropium 0.5 mg-albuterol 2.5 mg (DUONEB) nebulizer solution 1 Dose  1 Dose Inhalation BID RT    rosuvastatin (CRESTOR) tablet 10 mg  10 mg Oral Nightly    sodium chloride (Inhalant) 3 % nebulizer solution 4 mL  4 mL Nebulization PRN    arformoterol tartrate (BROVANA) nebulizer solution 15 mcg  15 mcg Nebulization BID RT    atropine 1 % ophthalmic solution 1 drop  1 drop Left Eye Nightly    brimonidine (ALPHAGAN) 0.2 % ophthalmic solution 1 drop  1 drop Left Eye TID    sodium chloride flush 0.9 % injection 5-40 mL  5-40 mL IntraVENous 2 times per day    sodium chloride flush 0.9 % injection 5-40 mL  5-40 mL IntraVENous PRN    0.9 % sodium chloride infusion   IntraVENous PRN    potassium chloride (KLOR-CON M) extended release tablet 40 mEq  40 mEq Oral PRN    Or    potassium bicarb-citric acid (EFFER-K) effervescent tablet 40 mEq  40 mEq Oral PRN    Or    potassium chloride 10 mEq/100 mL IVPB (Peripheral Line)  10 mEq IntraVENous PRN    magnesium sulfate 2000 mg in 50 mL IVPB premix  2,000 mg IntraVENous PRN    ondansetron (ZOFRAN-ODT)  disintegrating tablet 4 mg  4 mg Oral Q8H PRN    Or    ondansetron (ZOFRAN) injection 4 mg  4 mg IntraVENous Q6H PRN    polyethylene glycol (GLYCOLAX) packet 17 g  17 g Oral Daily PRN    acetaminophen (TYLENOL) tablet 650 mg  650 mg Oral Q6H PRN    Or    acetaminophen (TYLENOL) suppository 650 mg  650 mg Rectal Q6H PRN    guaiFENesin (MUCINEX) extended release tablet 600 mg  600 mg Oral BID    carvedilol (COREG) tablet 6.25 mg  6.25 mg Oral BID WC    doxazosin (CARDURA) tablet 4 mg  4 mg Oral Daily    pantoprazole (PROTONIX) tablet 40 mg  40 mg Oral QAM AC    furosemide (LASIX) tablet 20 mg  20 mg Oral Daily       Signed:  Verdis Prime, MD    Part of this note may have been written by using a voice dictation software.  The note has been proof read but may still contain some grammatical/other typographical errors.

## 2023-07-18 LAB — BASIC METABOLIC PANEL
Anion Gap: 10 mmol/L (ref 7–16)
BUN: 38 mg/dL — ABNORMAL HIGH (ref 8–23)
CO2: 23 mmol/L (ref 20–29)
Calcium: 8.9 mg/dL (ref 8.8–10.2)
Chloride: 104 mmol/L (ref 98–107)
Creatinine: 1.51 mg/dL — ABNORMAL HIGH (ref 0.80–1.30)
Est, Glom Filt Rate: 43 mL/min/{1.73_m2} — ABNORMAL LOW (ref >60)
Glucose: 111 mg/dL — ABNORMAL HIGH (ref 70–99)
Potassium: 4.7 mmol/L (ref 3.5–5.1)
Sodium: 137 mmol/L (ref 136–145)

## 2023-07-18 LAB — CBC WITH AUTO DIFFERENTIAL
Basophils %: 0.5 % (ref 0.0–2.0)
Basophils Absolute: 0.03 10*3/uL (ref 0.00–0.20)
Eosinophils %: 0.3 % — ABNORMAL LOW (ref 0.5–7.8)
Eosinophils Absolute: 0.02 10*3/uL (ref 0.00–0.80)
Hematocrit: 24.7 % — ABNORMAL LOW (ref 41.1–50.3)
Hemoglobin: 8.1 g/dL — ABNORMAL LOW (ref 13.6–17.2)
Immature Granulocytes %: 4.5 % (ref 0.0–5.0)
Immature Granulocytes Absolute: 0.29 10*3/uL (ref 0.0–0.5)
Lymphocytes %: 6.4 % — ABNORMAL LOW (ref 13.0–44.0)
Lymphocytes Absolute: 0.41 10*3/uL — ABNORMAL LOW (ref 0.50–4.60)
MCH: 28.4 pg (ref 26.1–32.9)
MCHC: 32.8 g/dL (ref 31.4–35.0)
MCV: 86.7 fL (ref 82.0–102.0)
MPV: 12.1 fL (ref 9.4–12.3)
Monocytes %: 9.8 % (ref 4.0–12.0)
Monocytes Absolute: 0.63 10*3/uL (ref 0.10–1.30)
Neutrophils %: 78.5 % — ABNORMAL HIGH (ref 43.0–78.0)
Neutrophils Absolute: 5.05 10*3/uL (ref 1.70–8.20)
Platelets: 154 10*3/uL (ref 150–450)
RBC: 2.85 M/uL — ABNORMAL LOW (ref 4.23–5.6)
RDW: 15.5 % — ABNORMAL HIGH (ref 11.9–14.6)
WBC: 6.4 10*3/uL (ref 4.3–11.1)
nRBC: 0 10*3/uL (ref 0.0–0.2)

## 2023-07-18 LAB — MAGNESIUM: Magnesium: 2.2 mg/dL (ref 1.8–2.4)

## 2023-07-18 LAB — PHOSPHORUS: Phosphorus: 3.8 mg/dL (ref 2.5–4.5)

## 2023-07-18 MED ORDER — HYDROMORPHONE HCL 1 MG/ML IJ SOLN
1 | INTRAMUSCULAR | Status: DC | PRN
Start: 2023-07-18 — End: 2023-07-18

## 2023-07-18 MED ORDER — DEXTROSE 10 % IV BOLUS
INTRAVENOUS | Status: DC | PRN
Start: 2023-07-18 — End: 2023-07-18

## 2023-07-18 MED ORDER — DEXTROSE 10 % IV SOLN
10 | INTRAVENOUS | Status: DC | PRN
Start: 2023-07-18 — End: 2023-07-18

## 2023-07-18 MED ORDER — ETOMIDATE 2 MG/ML IV SOLN
2 | Freq: Once | INTRAVENOUS | Status: DC | PRN
Start: 2023-07-18 — End: 2023-07-18
  Administered 2023-07-18: 17:00:00 2 via INTRAVENOUS
  Administered 2023-07-18: 17:00:00 6 via INTRAVENOUS

## 2023-07-18 MED ORDER — GLUCAGON EMERGENCY 1 MG IJ KIT
1 | INTRAMUSCULAR | Status: DC | PRN
Start: 2023-07-18 — End: 2023-07-18

## 2023-07-18 MED ORDER — NORMAL SALINE FLUSH 0.9 % IV SOLN
0.9 | INTRAVENOUS | Status: DC | PRN
Start: 2023-07-18 — End: 2023-07-18

## 2023-07-18 MED ORDER — NORMAL SALINE FLUSH 0.9 % IV SOLN
0.9 | Freq: Two times a day (BID) | INTRAVENOUS | Status: DC
Start: 2023-07-18 — End: 2023-07-18

## 2023-07-18 MED ORDER — ONDANSETRON HCL 4 MG/2ML IJ SOLN
42 | Freq: Once | INTRAMUSCULAR | Status: DC | PRN
Start: 2023-07-18 — End: 2023-07-18

## 2023-07-18 MED ORDER — ETOMIDATE 2 MG/ML IV SOLN
2 | INTRAVENOUS | Status: AC
Start: 2023-07-18 — End: ?

## 2023-07-18 MED ORDER — OXYCODONE HCL 5 MG PO TABS
5 | Freq: Once | ORAL | Status: DC | PRN
Start: 2023-07-18 — End: 2023-07-18

## 2023-07-18 MED ORDER — GLUCOSE 4 G PO CHEW
4 | ORAL | Status: DC | PRN
Start: 2023-07-18 — End: 2023-07-18

## 2023-07-18 MED ORDER — LIDOCAINE HCL (PF) 2 % IJ SOLN
2 | Freq: Once | INTRAMUSCULAR | Status: DC | PRN
Start: 2023-07-18 — End: 2023-07-18
  Administered 2023-07-18: 17:00:00 100 via INTRAVENOUS

## 2023-07-18 MED ORDER — LACTATED RINGERS IV SOLN
INTRAVENOUS | Status: DC | PRN
Start: 2023-07-18 — End: 2023-07-18
  Administered 2023-07-18: 17:00:00 via INTRAVENOUS

## 2023-07-18 MED ORDER — PROPOFOL 200 MG/20ML IV EMUL
20020 | INTRAVENOUS | Status: DC | PRN
Start: 2023-07-18 — End: 2023-07-18
  Administered 2023-07-18: 17:00:00 25 via INTRAVENOUS

## 2023-07-18 MED ORDER — LACTATED RINGERS IV SOLN
INTRAVENOUS | Status: DC
Start: 2023-07-18 — End: 2023-07-18

## 2023-07-18 MED ORDER — SODIUM CHLORIDE 0.9 % IV SOLN
0.9 | INTRAVENOUS | Status: DC | PRN
Start: 2023-07-18 — End: 2023-07-18

## 2023-07-18 MED ORDER — HALOPERIDOL LACTATE 5 MG/ML IJ SOLN
5 | Freq: Once | INTRAMUSCULAR | Status: DC | PRN
Start: 2023-07-18 — End: 2023-07-18

## 2023-07-18 MED ORDER — SODIUM CHLORIDE (PF) 0.9 % IJ SOLN
0.9 | INTRAMUSCULAR | Status: DC | PRN
Start: 2023-07-18 — End: 2023-07-18

## 2023-07-18 MED FILL — ARFORMOTEROL TARTRATE 15 MCG/2ML IN NEBU: 152 MCG/2ML | RESPIRATORY_TRACT | Qty: 2

## 2023-07-18 MED FILL — ELIQUIS 5 MG PO TABS: 5 MG | ORAL | Qty: 1

## 2023-07-18 MED FILL — CARVEDILOL 6.25 MG PO TABS: 6.25 MG | ORAL | Qty: 1

## 2023-07-18 MED FILL — AMIDATE 2 MG/ML IV SOLN: 2 MG/ML | INTRAVENOUS | Qty: 20

## 2023-07-18 MED FILL — NYSTATIN 100000 UNIT/ML MT SUSP: 100000 UNIT/ML | OROMUCOSAL | Qty: 5

## 2023-07-18 MED FILL — GUAIFENESIN ER 600 MG PO TB12: 600 MG | ORAL | Qty: 1

## 2023-07-18 MED FILL — MAGIC MOUTHWASH WITHOUT NYSTATIN SIMPLE: Qty: 10

## 2023-07-18 MED FILL — IPRATROPIUM-ALBUTEROL 0.5-2.5 (3) MG/3ML IN SOLN: 0.5-2.533 (3) MG/3ML | RESPIRATORY_TRACT | Qty: 3

## 2023-07-18 MED FILL — FUROSEMIDE 20 MG PO TABS: 20 MG | ORAL | Qty: 1

## 2023-07-18 MED FILL — DOXAZOSIN MESYLATE 4 MG PO TABS: 4 MG | ORAL | Qty: 1

## 2023-07-18 MED FILL — CULTURELLE PO CAPS: ORAL | Qty: 1

## 2023-07-18 MED FILL — ROSUVASTATIN CALCIUM 5 MG PO TABS: 5 MG | ORAL | Qty: 2

## 2023-07-18 MED FILL — DEXAMETHASONE 4 MG PO TABS: 4 MG | ORAL | Qty: 2

## 2023-07-18 MED FILL — PANTOPRAZOLE SODIUM 40 MG PO TBEC: 40 MG | ORAL | Qty: 1

## 2023-07-18 NOTE — Anesthesia Post-Procedure Evaluation (Signed)
 Department of Anesthesiology  Postprocedure Note    Patient: Paul Medina  MRN: [id number]  Birthdate: 1930-10-25  Date of evaluation: 07/18/2023    Procedure Summary       Date: 07/18/23 Room / Location: [facility] 01 / [facility]    Anesthesia Start: 1202 Anesthesia Stop: 1230    Procedure: ESOPHAGOGASTRODUODENOSCOPY BIOPSY/balloon dilation (Upper GI Region) Diagnosis:       Dysphagia      (Dysphagia [R13.10])    Surgeons: Theressa Millard, MD Responsible Provider: Bonnielee Haff, MD    Anesthesia Type: TIVA ASA Status: 3 - Emergent            Anesthesia Type: No value filed.    Aldrete Phase I: Aldrete Score: 9    Aldrete Phase II:      Anesthesia Post Evaluation    Patient location during evaluation: PACU  Patient participation: complete - patient participated  Level of consciousness: awake and alert  Pain scale: pain adequately controlled.  Airway patency: patent  Nausea & Vomiting: no nausea and no vomiting  Cardiovascular status: hemodynamically stable  Respiratory status: acceptable  Hydration status: euvolemic  Multimodal analgesia pain management approach  Pain management: adequate    No notable events documented.

## 2023-07-18 NOTE — Progress Notes (Addendum)
 GOALS:  LTG: Patient will maintain adequate hydration/nutrition with optimum safety and efficiency of swallowing function with PO intake without overt signs or symptoms of aspiration for the highest appropriate diet level.  STG: Goals Updated Post EGD on 07/18/2023  Patient will consume minced and moist diet with thin liquids without adverse pulmonary events to meet nutrition/hydration needs via PO modality.   Patient will utilize compensatory strategies to improve safety with PO intake without assistance.     SPEECH LANGUAGE PATHOLOGY: Dysphagia Daily Note #4    Acknowledge Order  I  Therapy Time  I   Charges     I  Rehab Caseload Tracker  NAME: Paul Medina  DOB: [birth year]  MRN: [id number]    ADMISSION DATE: 07/04/2023  PRIMARY DIAGNOSIS: Acute respiratory failure with hypoxia [facility])    ICD-10: Treatment Diagnosis: R13.12 Dysphagia, Oropharyngeal Phase    RECOMMENDATIONS   Diet:    Minced and Moist  Thin Liquids    Medication: whole floated in puree or applesauce and crushed in puree or applesauce, as permitted by pharmacy   Compensatory Swallowing Strategies:   Upright for all PO  Set-up assistance  Small bites and sips  May do better with cups but will defer to patient if he prefers straws with liquids (single aspiration event noted with straws during MBSS on 07/06/2023 though patient able to cough/clear contrast from trachea in response to aspiration events).    Therapeutic Intervention:   Patient/family education  Dysphagia treatment   Patient continues to require skilled intervention:  Yes. Recommend ongoing speech therapy services during this hospitalization.     Anticipated Discharge Needs: Family reports plan is to discharge to daughter's house and family in favor of home health speech therapy services given patient's swallowing difficulty during hospitalization.     Recommend home health speech therapy services post acute care.      ASSESSMENT    Dysphagia: Patient did exhibit cough during PO intake of  thin liquids, nectar thick liquids, purees, and solids. Of note, coughing was not exclusive to PO intake and patient when exhibiting cough, was typically productive in nature (able to clear thick secretions).     When questioned of status patient denies difficulty with swallowing aforementioned PO presentations. Discussed options for PO diet and patient in favor of restarting minced and moist diet along with thin liquids - recommend minced and moist diet with thin liquids.     Discussed with patient and family that if concern regarding swallowing status remains, speech therapy can repeat swallow assessment. MBSS on 07/06/2023 significant for aspiration events with thin liquids via straw though patient able to cough/clear contrast from trachea independently. Patient and family declined need for testing though patient's Son-In-Law requesting speech therapy revisit patient to discuss with patient's daughter who should arrive later today. Will update this note post conversation with family.     ADDENDUM (16:15): Discussed status with patient's daughter. Family agreeable to current recommendations of minced and moist diet with thin liquids. OK to advance diet as tolerated by family post discharge; Patient with known GI difficulty which is chronic. Informed patient he is OK to attempt foods past purees/minced and moist if he is able to tolerate. Will not pursue repeat instrumental assessment as patient reports swallowing function much improved with medical treatment and post dilation from GI services.     As patient has required diet modification and swallowing difficulty noted during hospitalization patient will benefit from home health speech therapy services for dysphagia. Family agreeable to  recommendations.     GENERAL    Subjective: Patient awake, alert, and agreeable to speech therapy services.     Reason for Consult: concern for aspiration.     History of Present Injury/Illness: Paul Medina  has a past medical  history of Arthritis, Asthma, Chronic bronchitis (HCC), Hypertension, and Lung cancer (HCC).. He also  has no past surgical history on file.  Precautions/Allergies: Ciprofloxacin, Doxycycline, Lisinopril, Other, Sulfa antibiotics, Sulfasalazine, and Sulfur     Observations:  Alertness: Alert  Drowsy  Voice: WFL  Speech: Intelligible  Expressive Language: Fluent and Able to communicate wants and needs  Receptive Language: Answers yes/no questions, Follows basic commands, and Appropriately responds to questions  Cognition: Appropriately attends to clinician    Prior Dysphagia History: Seen last week with recommendations for minced and moist solids, thin liquids due to patient report of esophageal dysphagia.   Prior Instrumental Assessment: From 07/06/2203 : Patient presents with mild to moderate oropharyngeal dysphagia characterized by diminished tongue base retraction and increased fatigue as study progressed resulting in increased vallecular residue.      Oral phase marked by prolonged prep and transfer with mixed consistency fruit cup and course crackers in the setting of fatigue. Pharyngeal phase marked by vallecular residue after all swallows requiring double swallow to facilitate clearance. Increased residue observed with mildly thick liquid (nectar) and course crackers, though partial clearance of vallecular residue was observed with use of liquid wash in single sips. Patient did experience single aspiration event after the swallow and single instance of deep penetration during the swallow when consuming thin liquids via straw, however reactive cough was elicited which cleared aspirated/penetrated bolus from trachea.      Recommend minced and moist solids per patient preference, thin liquids without straws, and medications whole in puree or crushed in puree. Patient with known history of esophageal dysphagia. CP bar was observed, however this does not obstruct flow of bolus.    EGD performed on 07/18/2023 with  dilation performed due to Schatzki's ring. Small hiatal hernia and possible mild esophagitis noted as well.     Current Diet:  minced and moist  thin liquids     Respiratory Status: Room air    Pain:  Patient does not c/o pain  Pain intervention: None- No pain observed  Pain response: Patient satisfied    OBJECTIVE    Dysphagia: Patient agreeable to PO trials consuming ~4 oz thin liquids via cup/straw, 4 oz of mildly thick/nectar thick liquids via cup/straw, purees x's 4, solids/graham crackers x's 2. Patient able to consume all PO trials without assistance. Oral phase of swallow was unremarkable. Of note, patient reports using small bites and taking additional time with mastication due to frequent globus sensation in chest with PO intake, otherwise patient denies difficulty. Pharyngeal phase of swallow was unremarkable as able to be assessed at bedside. Patient did exhibit intermittent cough which was not exclusive or more frequent with any PO presentation. Cough also noted without PO trials as well. When cough was observed, was productive with patient clearing thick secretions. When questions of status, patient denies difficulty with swallowing function with all PO presentations though patient stating preference for minced and moist foods based on prior diet order.     Dysphagia Outcome and Severity Scale (DOSS)  Score: 5 Description   []  7 Normal in all situations   []  6 Within Functional Limits/ Modified independent   [x]  5 Mild Dysphagia: Distant Supervision. May need one diet consistency  restricted.   []  4 Mild-Moderate Dysphagia: Intermittent supervision/cuing. One-two diet    consistencies restricted.   []  3 Moderate Dysphagia: Total assistance, supervision, or strategies.       Two or more diet consistencies restricted.   []  2 Moderate-Severe Dysphagia: Maximum assistance or maximum use     of strategies with partial po intake   []  1 Severe dysphagia- NPO. Unable to tolerate any po safely       PLAN     Duration/Frequency: Continue to follow patient 3x/week for duration of hospitalization and/or until goals met    Rehabilitation Potential For Stated Goals: Good    Interdisciplinary Collaboration: RN/ PCT    Medical Necessity    Skilled intervention continues to be required due to patient still consuming a modified diet and medical complications.    Education:   Patient and/or family/caregiver educated on Diet recommendations, SLP recommendations, and SLP plan  Education response: Verbalizes understanding    Safety:   Call light within reach  In chair  RN notified     Therapy Time:  Time In: 1350  Time Out: 1420  Minutes: 30    Sherene Plancarte, M.S.,CCC-SLP  07/18/2023 2:30 PM

## 2023-07-18 NOTE — Progress Notes (Signed)
 Hospitalist Progress Note   Admit Date:  07/04/2023 11:46 AM   Name:  Paul Medina   Age:  89 y.o.  Sex:  male  DOB:  [birth year]   MRN:  [id number]   Room:  ENDO/PL    Presenting/Chief Complaint: Shortness of Breath     Reason(s) for Admission: Severe sepsis (HCC) [A41.9, R65.20]  Pneumonia of right lung due to infectious organism, unspecified part of lung [J18.9]  Sepsis, due to unspecified organism, unspecified whether acute organ dysfunction present [facility]) [A41.9]     Hospital Day #14      Hospital Course:   Keontay Vora is a 89 y.o. male with chronic combined heart failure, PAF on Eliquis, HTN, CAD, history of TIA, left eye blindness, OSA on CPAP, and CKD stage III admitted on 2/11 due to severe sepsis and acute respiratory failure from bilateral pneumonia consistent with aspiration and hospital-acquired pneumonia.  He also had recently been diagnosed with COVID.  2/11 CT chest with worsening right sided infiltrates and improving left sided infiltrates.    Subjective & 24hr Events:   Patient seen immediately following return to room from endoscopy recovery.  He is awake, alert, and states he is feeling well.  Denies pain in his throat.  His son-in-law is at bedside.    Assessment & Plan:     Bilateral aspiration pneumonia/severe sepsis  Afebrile.  WBC normalized.  Completed Augmentin and azithromycin on 2/19.    Dysphagia  Status-post EGD today, showing small hiatal hernia and Schatzki's ring which was dilated.  Speech therapy to reevaluate and recommend post-intervention diet.    COVID-19 virus infection  Continue dexamethasone x 10 days.    Acute hypoxic respiratory failure  Secondary to aspiration and COVID-19 with.  Continue to wean supplemental O2 off as tolerated.    Chronic kidney disease stage III  Renal function baseline.    Essential hypertension  Continue carvedilol.    Chronic combined systolic and diastolic congestive heart failure  Continue Lasix daily.    Paroxysmal atrial  fibrillation  Remains on beta-blocker and Eliquis.      Anticipated Discharge Arrangements:   Skilled Nursing Facility, however family wishes to take him home with home health services, to which the patient is in agreement.    PT/OT evals ordered?  Therapy evals ordered  Diet:  Diet NPO  VTE prophylaxis: Already on anticoagulation  Code status: DNR      Non-peripheral Lines and Tubes (if present):          Telemetry (if present):  Cardiac/Telemetry Monitor On: No        Hospital Problems:  Principal Problem:    Severe sepsis (HCC)  Active Problems:    Chronic combined systolic and diastolic heart failure (HCC)    HTN (hypertension)    HLD (hyperlipidemia)    Coronary atherosclerosis of native coronary artery    History of TIA (transient ischemic attack)    OSA (obstructive sleep apnea)    Stage 3 chronic kidney disease (HCC)    COVID    Atrial fibrillation (HCC)    Pneumonia of right lung due to infectious organism    Acute respiratory failure with hypoxia (HCC)    Aspiration pneumonia (HCC)    History of B-cell lymphoma    DNR (do not resuscitate)    Oropharyngeal dysphagia  Resolved Problems:    * No resolved hospital problems. *      Objective:   Patient Vitals for the past 24 hrs:  Temp Pulse Resp BP SpO2   07/18/23 1230 98.1 F (36.7 C) 69 17 (!) 153/76 100 %   07/18/23 0950 -- 74 -- (!) 145/67 --   07/18/23 0759 -- 74 -- -- 97 %   07/18/23 0716 97.2 F (36.2 C) 72 17 (!) 145/67 97 %   07/17/23 2157 -- 87 16 -- 97 %   07/17/23 1953 97.9 F (36.6 C) 78 18 98/60 96 %   07/17/23 1724 -- 78 -- (!) 114/53 98 %       Oxygen Therapy  SpO2: 100 %  Pulse via Oximetry: 83 beats per minute  Pulse Oximeter Device Mode: Continuous  Pulse Oximeter Device Location: Left, Finger  O2 Device: Nasal cannula  Skin Assessment: Clean, dry, & intact  FiO2 : 32 %  O2 Flow Rate (L/min): 4 L/min    Estimated body mass index is 29.05 kg/m as calculated from the following:    Height as of this encounter: 1.676 m (5\' 6" ).    Weight as  of this encounter: 81.6 kg (180 lb).    Intake/Output Summary (Last 24 hours) at 07/18/2023 1239  Last data filed at 07/18/2023 1223  Gross per 24 hour   Intake 410 ml   Output 1675 ml   Net -1265 ml         Physical Exam:   General:    Well nourished elderly male reclining in bed, NAD  Head:  Normocephalic, atraumatic  Eyes:  Sclerae appear normal.  Pupils equally round.  ENT:  Nares appear normal.  Moist oral mucosa.  Neck:  No restricted ROM.  Trachea midline.   CV:   RRR.  No m/r/g.  No jugular venous distension.  No LE edema.  Lungs:   Pharyngeal secretions audible at bedside.  CTAB.  No wheezing, rhonchi, or rales.  Symmetric expansion.  4 L O2 BNC, SpO2 98%.  Abdomen:   Soft, nontender, nondistended.  Skin:     No rashes.  Normal coloration.   Warm and dry.    Neuro:  AAOx3.  CN II-XII grossly intact.    Psych:  Normal mood and affect.      I have personally reviewed labs and tests:  Recent Labs:  Recent Results (from the past 48 hour(s))   Magnesium    Collection Time: 07/17/23  6:42 AM   Result Value Ref Range    Magnesium 2.2 1.8 - 2.4 mg/dL   Phosphorus    Collection Time: 07/17/23  6:42 AM   Result Value Ref Range    Phosphorus 3.7 2.5 - 4.5 MG/DL   Basic Metabolic Panel    Collection Time: 07/17/23  6:42 AM   Result Value Ref Range    Sodium 136 136 - 145 mmol/L    Potassium 5.2 (H) 3.5 - 5.1 mmol/L    Chloride 103 98 - 107 mmol/L    CO2 21 20 - 29 mmol/L    Anion Gap 12 7 - 16 mmol/L    Glucose 99 70 - 99 mg/dL    BUN 36 (H) 8 - 23 MG/DL    Creatinine 8.29 (H) 0.80 - 1.30 MG/DL    Est, Glom Filt Rate 51 (L) >60 ml/min/1.53m2    Calcium 9.0 8.8 - 10.2 MG/DL   Magnesium    Collection Time: 07/18/23  6:16 AM   Result Value Ref Range    Magnesium 2.2 1.8 - 2.4 mg/dL   Phosphorus    Collection Time: 07/18/23  6:16 AM  Result Value Ref Range    Phosphorus 3.8 2.5 - 4.5 MG/DL   Basic Metabolic Panel    Collection Time: 07/18/23  6:16 AM   Result Value Ref Range    Sodium 137 136 - 145 mmol/L    Potassium 4.7  3.5 - 5.1 mmol/L    Chloride 104 98 - 107 mmol/L    CO2 23 20 - 29 mmol/L    Anion Gap 10 7 - 16 mmol/L    Glucose 111 (H) 70 - 99 mg/dL    BUN 38 (H) 8 - 23 MG/DL    Creatinine 0.98 (H) 0.80 - 1.30 MG/DL    Est, Glom Filt Rate 43 (L) >60 ml/min/1.58m2    Calcium 8.9 8.8 - 10.2 MG/DL   CBC with Auto Differential    Collection Time: 07/18/23  6:16 AM   Result Value Ref Range    WBC 6.4 4.3 - 11.1 K/uL    RBC 2.85 (L) 4.23 - 5.6 M/uL    Hemoglobin 8.1 (L) 13.6 - 17.2 g/dL    Hematocrit 11.9 (L) 41.1 - 50.3 %    MCV 86.7 82.0 - 102.0 FL    MCH 28.4 26.1 - 32.9 PG    MCHC 32.8 31.4 - 35.0 g/dL    RDW 14.7 (H) 82.9 - 14.6 %    Platelets 154 150 - 450 K/uL    MPV 12.1 9.4 - 12.3 FL    nRBC 0.00 0.0 - 0.2 K/uL    Differential Type AUTOMATED      Neutrophils % 78.5 (H) 43.0 - 78.0 %    Lymphocytes % 6.4 (L) 13.0 - 44.0 %    Monocytes % 9.8 4.0 - 12.0 %    Eosinophils % 0.3 (L) 0.5 - 7.8 %    Basophils % 0.5 0.0 - 2.0 %    Immature Granulocytes % 4.5 0.0 - 5.0 %    Neutrophils Absolute 5.05 1.70 - 8.20 K/UL    Lymphocytes Absolute 0.41 (L) 0.50 - 4.60 K/UL    Monocytes Absolute 0.63 0.10 - 1.30 K/UL    Eosinophils Absolute 0.02 0.00 - 0.80 K/UL    Basophils Absolute 0.03 0.00 - 0.20 K/UL    Immature Granulocytes Absolute 0.29 0.0 - 0.5 K/UL       Current Meds:  Current Facility-Administered Medications   Medication Dose Route Frequency    naloxone 0.4 mg in 10 mL sodium chloride syringe   IntraVENous PRN    lactated ringers infusion   IntraVENous Continuous    sodium chloride flush 0.9 % injection 5-40 mL  5-40 mL IntraVENous 2 times per day    sodium chloride flush 0.9 % injection 5-40 mL  5-40 mL IntraVENous PRN    0.9 % sodium chloride infusion   IntraVENous PRN    HYDROmorphone (DILAUDID) injection 0.25 mg  0.25 mg IntraVENous Q5 Min PRN    HYDROmorphone (DILAUDID) injection 0.25 mg  0.25 mg IntraVENous Q5 Min PRN    oxyCODONE (ROXICODONE) immediate release tablet 5 mg  5 mg Oral Once PRN    ondansetron (ZOFRAN)  injection 4 mg  4 mg IntraVENous Once PRN    haloperidol lactate (HALDOL) injection 1 mg  1 mg IntraVENous Once PRN    glucose chewable tablet 16 g  4 tablet Oral PRN    dextrose bolus 10% 125 mL  125 mL IntraVENous PRN    Or    dextrose bolus 10% 250 mL  250 mL IntraVENous PRN  Glucagon Emergency KIT 1 mg  1 mg SubCUTAneous PRN    dextrose 10 % infusion   IntraVENous Continuous PRN    saliva substitute (BIOTENE/MOUTH KOTE) liquid   Oral TID PRN    magic (miracle) mouthwash  10 mL Swish & Spit 4x Daily PRN    nystatin (MYCOSTATIN) 100000 UNIT/ML suspension 500,000 Units  5 mL Oral 4x Daily    apixaban (ELIQUIS) tablet 5 mg  5 mg Oral 2 times per day    lactobacillus (CULTURELLE) capsule 1 capsule  1 capsule Oral Daily with breakfast    prednisoLONE acetate (PRED FORTE) 1 % ophthalmic suspension 1 drop  1 drop Left Eye Daily    medicated lip ointment (BLISTEX)   Topical PRN    albuterol (PROVENTIL) (2.5 MG/3ML) 0.083% nebulizer solution 2.5 mg  2.5 mg Nebulization Q4H PRN    dexAMETHasone (DECADRON) tablet 6 mg  6 mg Oral Daily    ipratropium 0.5 mg-albuterol 2.5 mg (DUONEB) nebulizer solution 1 Dose  1 Dose Inhalation BID RT    rosuvastatin (CRESTOR) tablet 10 mg  10 mg Oral Nightly    sodium chloride (Inhalant) 3 % nebulizer solution 4 mL  4 mL Nebulization PRN    arformoterol tartrate (BROVANA) nebulizer solution 15 mcg  15 mcg Nebulization BID RT    atropine 1 % ophthalmic solution 1 drop  1 drop Left Eye Nightly    brimonidine (ALPHAGAN) 0.2 % ophthalmic solution 1 drop  1 drop Left Eye TID    sodium chloride flush 0.9 % injection 5-40 mL  5-40 mL IntraVENous 2 times per day    sodium chloride flush 0.9 % injection 5-40 mL  5-40 mL IntraVENous PRN    0.9 % sodium chloride infusion   IntraVENous PRN    potassium chloride (KLOR-CON M) extended release tablet 40 mEq  40 mEq Oral PRN    Or    potassium bicarb-citric acid (EFFER-K) effervescent tablet 40 mEq  40 mEq Oral PRN    Or    potassium chloride 10 mEq/100  mL IVPB (Peripheral Line)  10 mEq IntraVENous PRN    magnesium sulfate 2000 mg in 50 mL IVPB premix  2,000 mg IntraVENous PRN    ondansetron (ZOFRAN-ODT) disintegrating tablet 4 mg  4 mg Oral Q8H PRN    Or    ondansetron (ZOFRAN) injection 4 mg  4 mg IntraVENous Q6H PRN    polyethylene glycol (GLYCOLAX) packet 17 g  17 g Oral Daily PRN    acetaminophen (TYLENOL) tablet 650 mg  650 mg Oral Q6H PRN    Or    acetaminophen (TYLENOL) suppository 650 mg  650 mg Rectal Q6H PRN    guaiFENesin (MUCINEX) extended release tablet 600 mg  600 mg Oral BID    carvedilol (COREG) tablet 6.25 mg  6.25 mg Oral BID WC    doxazosin (CARDURA) tablet 4 mg  4 mg Oral Daily    pantoprazole (PROTONIX) tablet 40 mg  40 mg Oral QAM AC    furosemide (LASIX) tablet 20 mg  20 mg Oral Daily       Signed:  Norval Morton, M.D.  Hospitalist  07/18/23   12:39 PM

## 2023-07-18 NOTE — Plan of Care (Signed)
 Problem: Discharge Planning  Goal: Discharge to home or other facility with appropriate resources  Outcome: Progressing     Problem: Safety - Adult  Goal: Free from fall injury  Outcome: Progressing     Problem: ABCDS Injury Assessment  Goal: Absence of physical injury  Outcome: Progressing     Problem: Respiratory - Adult  Goal: Achieves optimal ventilation and oxygenation  Outcome: Progressing     Problem: Skin/Tissue Integrity  Goal: Skin integrity remains intact  Description: 1.  Monitor for areas of redness and/or skin breakdown  2.  Assess vascular access sites hourly  3.  Every 4-6 hours minimum:  Change oxygen saturation probe site  4.  Every 4-6 hours:  If on nasal continuous positive airway pressure, respiratory therapy assess nares and determine need for appliance change or resting period  Outcome: Progressing

## 2023-07-18 NOTE — Progress Notes (Signed)
 SPEECH LANGUAGE PATHOLOGY: ATTEMPT     NAME: Paul Medina  DOB: 09-Sep-1930  MRN: [id number]    ADMISSION DATE: 07/04/2023  PRIMARY DIAGNOSIS: Severe sepsis [facility])    Patient NPO for EGD. Speech therapy will follow up and treat, as patient is medically appropriate and available.     [address] [place], [place]  07/18/2023 11:43 AM

## 2023-07-18 NOTE — Progress Notes (Signed)
 TRANSFER - IN REPORT:    Verbal report received from Maggie on Paul Medina  being received from PACU for routine post-op      Report consisted of patient's Situation, Background, Assessment and   Recommendations(SBAR).     Information from the following report(s) Nurse Handoff Report and MAR was reviewed with the receiving nurse.    Opportunity for questions and clarification was provided.      Assessment completed upon patient's arrival to unit and care assumed.

## 2023-07-18 NOTE — H&P (Signed)
 HISTORY AND PHYSICAL  Paul Medina  07/18/2023   Date of Admission:  07/04/2023    The patient's chart is reviewed and the patient is discussed with the staff.    Subjective:     Patient is a 89 y.o. male presents with mutiple issues and consult from Dr. Mel Almond for Mucus plugging.     Pleasant 89 y.o. male with chronic combined heart failure, PAF on Eliquis, HTN, CAD, history of TIA, left eye blindness, OSA on CPAP, and CKD stage III admitted on 2/11 due to severe sepsis and acute respiratory failure from bilateral pneumonia consistent with aspiration and hospital-acquired pneumonia.  He also had recently been diagnosed with COVID and treated.Blood cultures NGTD  UA unremarkable. 2/11 CT chest with some right sided infiltrates and improving left sided infiltrates. See images below    Today is now s/p EGD and noted dr. Doree Fudge note:        Patient in room awake and alert. Lives in assisted living and spry. Coughing up secretions at this time. Feels likely he is better. On oxygen and reports doe not use at home. No wheezing.     He did get Zosyn from 2/11 till 2/16. Augmentin from 2/16 to 2/20. Azithro from 2/16- 2/19    Review of Systems:  Comprehensive ROS negative except in HPI    Current Outpatient Medications   Medication Instructions    albuterol sulfate HFA (PROVENTIL;VENTOLIN;PROAIR) 108 (90 Base) MCG/ACT inhaler 2 puffs, Inhalation, EVERY 4 HOURS PRN    apixaban (ELIQUIS) 2.5 mg, Oral, 2 TIMES DAILY    ascorbic acid (VITAMIN C) 500 mg, Oral, DAILY    carvedilol (COREG) 6.25 mg, Oral, 2 TIMES DAILY WITH MEALS    dexAMETHasone (DECADRON) 6 mg, Oral, DAILY    lactobacillus (CULTURELLE) CAPS capsule 1 capsule, Oral, DAILY    lidocaine (LIDODERM) 5 % 1 patch, TransDERmal, DAILY    Multiple Vitamin (MULTIVITAMIN) tablet 1 tablet, Oral, DAILY    pantoprazole (PROTONIX) 40 mg, Oral, DAILY    prednisoLONE acetate (PRED FORTE) 1 % ophthalmic suspension 1  drop, 2 TIMES DAILY    rosuvastatin (CRESTOR) 30 mg, DAILY    tiotropium-olodaterol (STIOLTO) 2.5-2.5 MCG/ACT AERS 2 puffs, Inhalation, DAILY      Past Medical History:   Diagnosis Date    Arthritis     Asthma     Chronic bronchitis (HCC)     Hypertension     Lung cancer (HCC)      No past surgical history on file.  Social History     Socioeconomic History    Marital status: Single     Spouse name: Not on file    Number of children: Not on file    Years of education: Not on file    Highest education level: Not on file   Occupational History    Not on file   Tobacco Use    Smoking status: Former     Current packs/day: 0.00     Average packs/day: 1 pack/day for 34.0 years (34.0 ttl pk-yrs)     Types: Cigarettes     Start date: 05/31/1954     Quit date: 1990     Years since quitting: 35.1    Smokeless tobacco: Never   Substance and Sexual Activity    Alcohol use: Never    Drug use: Never    Sexual activity: Not Currently     Partners: Female   Other Topics Concern    Not on file  Social History Narrative    Not on file     Social Determinants of Health     Financial Resource Strain: Low Risk  (10/21/2022)    Received from [facility]    Overall Financial Resource Strain (CARDIA)     Difficulty of Paying Living Expenses: Not very hard   Food Insecurity: No Food Insecurity (07/04/2023)    Hunger Vital Sign     Worried About Running Out of Food in the Last Year: Never true     Ran Out of Food in the Last Year: Never true   Transportation Needs: No Transportation Needs (07/04/2023)    PRAPARE - Therapist, art (Medical): No     Lack of Transportation (Non-Medical): No   Physical Activity: Unknown (10/21/2022)    Received from [facility]    Exercise Vital Sign     Days of Exercise per Week: 0 days     Minutes of Exercise per Session: Not on file   Stress: Stress Concern Present (10/21/2022)    Received from [facility] - Occupational Stress Questionnaire      Feeling of Stress : To some extent   Social Connections: Unknown (02/23/2023)    Received from [facility]    Social Connections     Frequency of Communication with Friends and Family: Not asked     Frequency of Social Gatherings with Friends and Family: Not asked   Intimate Partner Violence: Unknown (02/23/2023)    Received from [facility]    Intimate Partner Violence     Fear of Current or Ex-Partner: Not asked     Emotionally Abused: Not asked     Physically Abused: Not asked     Sexually Abused: Not asked   Housing Stability: Low Risk  (07/04/2023)    Housing Stability Vital Sign     Unable to Pay for Housing in the Last Year: No     Number of Times Moved in the Last Year: 1     Homeless in the Last Year: No     No family history on file.  Allergies   Allergen Reactions    Ciprofloxacin Other (See Comments)     Other Reaction(s): Joint swelling-Intolerance    Doxycycline Nausea And Vomiting and Nausea Only     Other Reaction(s): GI Intolerance    Lisinopril Cough and Other (See Comments)     Other Reaction(s): Cough, Cough-Intolerance, Other (See Comments)    cough    cough   cough   cough    Other Other (See Comments) and Nausea And Vomiting     Causes sneezing, itchy/runny nose    Other Reaction(s): GI Intolerance    unknown    unknown, unknown    unknown   unknown    Causes sneezing, itchy/runny nose   Causes sneezing, itchy/runny nose   Causes sneezing, itchy/runny nose    Sulfa Antibiotics Nausea And Vomiting, Other (See Comments) and Rash     Other Reaction(s): GI Intolerance, Other- (not listed) - Allergy, Unknown    Unknown    Sulfasalazine Rash    Sulfur Itching and Nausea Only     Objective:     Vitals:    07/17/23 2157 07/18/23 0716 07/18/23 0759 07/18/23 0950   BP:  (!) 145/67  (!) 145/67   Pulse: 87 72 74 74   Resp: 16 17     Temp:  97.2 F (  36.2 C)     TempSrc:  Tympanic     SpO2: 97% 97% 97%    Weight:       Height:         PHYSICAL EXAM   Constitutional:  the patient is well developed and  in no acute distress on 4 LPM  EENMT:  Sclera clear, pupils equal, oral mucosa moist  Respiratory: symmetric chest rise. Mild rhonchi but moist cough and expectorating yellow dark secretions  Cardiovascular:  RRR without M,G,R. There is no lower extremity edema.  Gastrointestinal: soft and non-tender; with positive bowel sounds.  Musculoskeletal: warm without cyanosis. Normal muscle tone.   Skin:  no jaundice or rashes, no visible wounds   Neurologic: symmetric strength, fluent speech  Psychiatric:  calm, appropriate, oriented x 4    Imaging: I performed an independent interpretation of the patient's images.     Results for orders placed during the hospital encounter of 07/04/23    XR CHEST 1 VIEW    Narrative  TEMPORARY    Unknown    INDICATION:    PRIORS: 6 days prior  FINDINGS:  The cardiac silhouette appears normal.  The pulmonary vascularity is  normal.  The mediastinum, great vessels, pleura, diaphragms, and bony  thorax are unremarkable for age.    Impression  Silhouetting of the left hemidiaphragm which may represent a left lower lobe  pneumonia    : On the prior chest CT., The patient had a left basilar airspace process, which  was not well appreciated on today's study    Electronically signed by Charyl Dancer     Results for orders placed during the hospital encounter of 07/04/23          CT from now vs 2 weeks vs 3 weeks. Clearing left base and some on right but diffuse infitrates/tree and bud in both lung.         CT CHEST PULMONARY EMBOLISM W CONTRAST    Narrative  EXAMINATION: CT CHEST PULMONARY EMBOLISM W CONTRAST 07/04/2023 2:35 PM    ACCESSION NUMBER: [id number]    COMPARISON: CTA chest June 29, 2023.    INDICATION: shortness of breath    TECHNIQUE: Multiple contiguous 2D axial CT images of the chest were obtained  from the lung apices to the lung bases after the intravenous administration of  Iso-vue 370 per pulmonary angiography protocol.  Coronal reconstructions  were  performed.    Radiation dose reduction techniques were used for this study. Our CT scanners  use one or all of the following: Automated exposure control, adjustment of the  mA and/or kV according to patient size, iterative reconstruction.    FINDINGS:  No pulmonary embolus. No thoracic aortic aneurysm or dissection. Mild aortic  plaque. Cardiomegaly. No pericardial effusion. Moderate coronary artery  calcified plaque. Diffuse bronchitis with multifocal pneumonia, mild/moderate in  the lower lobes and fairly minimal in the remaining lobes. The right lower lobe  pneumonia has slightly worsened in the left lower lobe pneumonia has slightly  improved from 5 days ago. Right upper lobe pneumonia has slightly worsened.  Trace left pleural effusion. Right pleural effusion has resolved. Worsening  bilateral lower lobe mucous plugging from most recent.    Normal thyroid size. No adenopathy. No adenopathy.    Imaging through the upper abdomen shows borderline spleen size. 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 Left adrenal  nodule has not significantly changed from January 2024 PET/CT. Aortic  plaque.    Impression  1.  No pulmonary embolus identified. No thoracic aortic aneurysm/dissection.  2.  Compared to 5 days ago, overall similar appearing basilar predominant  pneumonia. Pneumonia has slightly worsened in the right lower lobe and right  upper lobe. Pneumonia slightly improved in the left lower lobe. Worsening mucous  plugging in the lower lobes raising possibility of aspiration  pneumonia/pneumonitis. Resolved right pleural effusion and unchanged trace left  pleural effusion.      Electronically signed by Elsie Stain    LAB:  Recent Labs     07/16/23  0509 07/18/23  0616   WBC 8.9 6.4   HGB 8.1* 8.1*   HCT 23.8* 24.7*   PLT 115* 154     Recent Labs     07/16/23  0509 07/17/23  0642 07/18/23  0616   NA 133* 136 137   K 5.0 5.2* 4.7   CL 104 103 104   CO2 21 21 23    BUN 40* 36* 38*    CREATININE 1.39* 1.31* 1.51*   MG 2.3 2.2 2.2   PHOS 3.8 3.7 3.8     No results for input(s): "LACTATE", "PROCAL", "TROPHS", "NTPROBNP", "CRP", "ESR" in the last 72 hours.  No results for input(s): "PHAPOC", "PCO2APOC", "PO2APOC", "HCO3MIX", "BE" in the last 72 hours.  Microbiology:   No results for input(s): "CULTURE" in the last 72 hours.  No results for input(s): "COVID19" in the last 72 hours.  Assessment and Plan:  (Medical Decision Making)   Impression: 89 y/o with OSA on CPAP/CKD/CAD/HTN/PATF with PNA and aspiration. Now also s/p EGD with noted schatzki's ring s/p ballon. Awake and not in distress. On oxygen at 4 and does nto  use at home.     Principal Problem:    Severe sepsis (HCC)   Aspiration pneumonia (HCC)  Plan:   --completed abx recently and does not need anymore -  Zosyn from 2/11 till 2/16. Augmentin from 2/16 to 2/20. Azithro from 2/16- 2/19  --WBC is normal now  --expectorating well now and continue mucinex  --will add flutter valve to help him expectorate.   --CT noted overall is improving - see images above for the last few studies.   -     Active Problems:    Chronic combined systolic and diastolic heart failure (HCC)    HTN (hypertension)    Coronary atherosclerosis of native coronary artery  Plan:    --per PMD      History of TIA (transient ischemic attack)    Afib  Plan: per history   --on Eliquis  --on Coreg  --on lasix      OSA (obstructive sleep apnea)  Plan:   --does have OSA and  uses a CPAP at home  --here continue oxygen for now         History of COVID  Plan:   --noted on study from 2/5  --does not need steroids now and not wheezing     ASPIRATION   Oropharyngeal dysphagia   Schatzki's Ring  --s/p dilitation now and should help  --monitor if aspirating.    More than 50% of the time documented was spent in face-to-face contact with the patient and in the care of the patient on the floor/unit where the patient is located.    Wandy Bossler Mcarthur Rossetti, MD    Dictated using voice recognition  software.  Proof read but unrecognized errors may exist.

## 2023-07-18 NOTE — Plan of Care (Signed)
 Problem: Discharge Planning  Goal: Discharge to home or other facility with appropriate resources  07/18/2023 0753 by Boykin Nearing, RN  Outcome: Progressing  07/17/2023 2353 by Higinio Plan, RN  Outcome: Progressing     Problem: Safety - Adult  Goal: Free from fall injury  07/18/2023 0753 by Boykin Nearing, RN  Outcome: Progressing  07/17/2023 2353 by Higinio Plan, RN  Outcome: Progressing     Problem: ABCDS Injury Assessment  Goal: Absence of physical injury  07/18/2023 0753 by Boykin Nearing, RN  Outcome: Progressing  07/17/2023 2353 by Higinio Plan, RN  Outcome: Progressing     Problem: Respiratory - Adult  Goal: Achieves optimal ventilation and oxygenation  07/18/2023 0753 by Boykin Nearing, RN  Outcome: Progressing  07/17/2023 2353 by Higinio Plan, RN  Outcome: Progressing     Problem: Skin/Tissue Integrity  Goal: Skin integrity remains intact  Description: 1.  Monitor for areas of redness and/or skin breakdown  2.  Assess vascular access sites hourly  3.  Every 4-6 hours minimum:  Change oxygen saturation probe site  4.  Every 4-6 hours:  If on nasal continuous positive airway pressure, respiratory therapy assess nares and determine need for appliance change or resting period  07/18/2023 0753 by Boykin Nearing, RN  Outcome: Progressing  07/17/2023 2353 by Higinio Plan, RN  Outcome: Progressing

## 2023-07-18 NOTE — Other (Signed)
 TRANSFER - OUT REPORT:    Verbal report given to New Site, RN on [employer]  being transferred to 357 for routine post-op       Report consisted of patient's Situation, Background, Assessment and   Recommendations(SBAR).     Information from the following report(s) Nurse Handoff Report, Adult Overview, Surgery Report, Cardiac Rhythm NSR, Quality Measures, and Neuro Assessment was reviewed with the receiving nurse.    Lines:   Peripheral IV 06/28/23 Left;Anterior Forearm (Active)   Site Assessment Clean, dry & intact 07/18/23 1303   Line Status Flushed 07/18/23 1303   Line Care Connections checked and tightened 07/18/23 0735   Phlebitis Assessment No symptoms 07/18/23 1303   Infiltration Assessment 0 07/18/23 1303   Alcohol Cap Used Yes 07/18/23 0735   Dressing Status Clean, dry & intact 07/18/23 1303   Dressing Type Transparent 07/18/23 1303        Opportunity for questions and clarification was provided.      Patient transported with:   O2 @ 4 liters    VTE prophylaxis orders have been written for [employer].    Patient and family given floor number and nurses name.  Family updated re: pt status after security code verified.

## 2023-07-18 NOTE — Anesthesia Pre-Procedure Evaluation (Signed)
 Department of Anesthesiology  Preprocedure Note       Name:  Paul Medina   Age:  89 y.o.  DOB:  [birth year]                                          MRN:  [id number]         Date:  07/18/2023      Surgeon: Moishe Spice):  Theressa Millard, MD    Procedure: Procedure(s):  ESOPHAGOGASTRODUODENOSCOPY    Medications prior to admission:   Prior to Admission medications    Medication Sig Start Date End Date Taking? Authorizing Provider   dexAMETHasone (DECADRON) 6 MG tablet Take 1 tablet by mouth daily for 5 doses 07/15/23 07/20/23 Yes Verdis Prime, MD   apixaban (ELIQUIS) 2.5 MG TABS tablet Take 1 tablet by mouth 2 times daily 06/30/23 07/30/23 Yes Verdis Prime, MD   carvedilol (COREG) 6.25 MG tablet Take 1 tablet by mouth 2 times daily (with meals) 06/30/23  Yes Verdis Prime, MD   lidocaine (LIDODERM) 5 % Place 1 patch onto the skin daily   Yes [provider]   Multiple Vitamin (MULTIVITAMIN) tablet Take 1 tablet by mouth daily   Yes [provider]   pantoprazole (PROTONIX) 40 MG tablet Take 1 tablet by mouth daily   Yes [provider]   prednisoLONE acetate (PRED FORTE) 1 % ophthalmic suspension 1 drop 2 times daily   Yes [provider]   tiotropium-olodaterol (STIOLTO) 2.5-2.5 MCG/ACT AERS Inhale 2 puffs into the lungs daily 05/11/23  Yes El Eleva, [place], MD   albuterol sulfate HFA (PROVENTIL;VENTOLIN;PROAIR) 108 (90 Base) MCG/ACT inhaler Inhale 2 puffs into the lungs every 4 hours as needed for Shortness of Breath 05/11/23  Yes [place], [place], MD   ascorbic acid (VITAMIN C) 500 MG tablet Take 1 tablet by mouth daily    [provider]   lactobacillus (CULTURELLE) CAPS capsule Take 1 capsule by mouth daily    [provider]   rosuvastatin (CRESTOR) 10 MG tablet 3 tablets daily    [provider]       Current medications:    Current Facility-Administered Medications   Medication Dose Route Frequency Provider Last Rate Last Admin   . saliva substitute  (BIOTENE/MOUTH KOTE) liquid   Oral TID PRN Su Monks, PA       . magic (miracle) mouthwash  10 mL Swish & Spit 4x Daily PRN Verdis Prime, MD       . nystatin (MYCOSTATIN) 100000 UNIT/ML suspension 500,000 Units  5 mL Oral 4x Daily Verdis Prime, MD   500,000 Units at 07/17/23 2033   . apixaban (ELIQUIS) tablet 5 mg  5 mg Oral 2 times per day Verdis Prime, MD   5 mg at 07/17/23 2033   . lactobacillus (CULTURELLE) capsule 1 capsule  1 capsule Oral Daily with breakfast Verdis Prime, MD   1 capsule at 07/17/23 0924   . prednisoLONE acetate (PRED FORTE) 1 % ophthalmic suspension 1 drop  1 drop Left Eye Daily Cyril Mourning, MD   1 drop at 07/17/23 0930   . medicated lip ointment (BLISTEX)   Topical PRN Verdis Prime, MD       . albuterol (PROVENTIL) (2.5 MG/3ML) 0.083% nebulizer solution 2.5 mg  2.5 mg Nebulization Q4H PRN Azzie Roup, MD   2.5 mg at 07/17/23 0904   .  dexAMETHasone (DECADRON) tablet 6 mg  6 mg Oral Daily Verdis Prime, MD   6 mg at 07/17/23 1308   . ipratropium 0.5 mg-albuterol 2.5 mg (DUONEB) nebulizer solution 1 Dose  1 Dose Inhalation BID RT Crenshaw, Woodson E, DO   1 Dose at 07/17/23 2157   . rosuvastatin (CRESTOR) tablet 10 mg  10 mg Oral Nightly Rushie Goltz [place], DO   10 mg at 07/17/23 2033   . sodium chloride (Inhalant) 3 % nebulizer solution 4 mL  4 mL Nebulization PRN Montano, Lorne Skeens, DO       . arformoterol tartrate [facility]) nebulizer solution 15 mcg  15 mcg Nebulization BID RT Rushie Goltz [place], DO   15 mcg at 07/17/23 2157   . atropine 1 % ophthalmic solution 1 drop  1 drop Left Eye Nightly Guduru, Ramana, MD   1 drop at 07/17/23 2033   . brimonidine (ALPHAGAN) 0.2 % ophthalmic solution 1 drop  1 drop Left Eye TID Sherron Flemings, MD   1 drop at 07/17/23 2033   . sodium chloride flush 0.9 % injection 5-40 mL  5-40 mL IntraVENous 2 times per day Rushie Goltz [place], DO   10 mL at 07/17/23 2258   . sodium chloride flush 0.9 % injection 5-40 mL   5-40 mL IntraVENous PRN Rushie Goltz [place], DO   10 mL at 07/07/23 1703   . 0.9 % sodium chloride infusion   IntraVENous PRN Montano, Lorne Skeens, DO       . potassium chloride (KLOR-CON M) extended release tablet 40 mEq  40 mEq Oral PRN Alm Bustard, DO        Or   . potassium bicarb-citric acid (EFFER-K) effervescent tablet 40 mEq  40 mEq Oral PRN Alm Bustard, DO        Or   . potassium chloride 10 mEq/100 mL IVPB (Peripheral Line)  10 mEq IntraVENous PRN Alm Bustard, DO       . magnesium sulfate 2000 mg in 50 mL IVPB premix  2,000 mg IntraVENous PRN Alm Bustard, DO       . ondansetron (ZOFRAN-ODT) disintegrating tablet 4 mg  4 mg Oral Q8H PRN Montano, Lorne Skeens, DO        Or   . ondansetron (ZOFRAN) injection 4 mg  4 mg IntraVENous Q6H PRN Montano, Lorne Skeens, DO       . polyethylene glycol (GLYCOLAX) packet 17 g  17 g Oral Daily PRN Montano, Lorne Skeens, DO       . acetaminophen (TYLENOL) tablet 650 mg  650 mg Oral Q6H PRN Alm Bustard, DO        Or   . acetaminophen (TYLENOL) suppository 650 mg  650 mg Rectal Q6H PRN Montano, Lorne Skeens, DO       . guaiFENesin [facility]) extended release tablet 600 mg  600 mg Oral BID Rushie Goltz [place], DO   600 mg at 07/17/23 2033   . carvedilol (COREG) tablet 6.25 mg  6.25 mg Oral BID WC Montano, Andreas [place], DO   6.25 mg at 07/17/23 1724   . doxazosin (CARDURA) tablet 4 mg  4 mg Oral Daily Rushie Goltz [place], DO   4 mg at 07/17/23 6578   . pantoprazole (PROTONIX) tablet 40 mg  40 mg Oral QAM AC Montano, Andreas [place], DO   40 mg at 07/18/23 4696   . furosemide (LASIX) tablet 20 mg  20 mg Oral Daily Verdis Prime, MD  20 mg at 07/10/23 0945       Allergies:    Allergies   Allergen Reactions   . Ciprofloxacin Other (See Comments)     Other Reaction(s): Joint swelling-Intolerance   . Doxycycline Nausea And Vomiting and Nausea Only     Other Reaction(s): GI Intolerance   .  Lisinopril Cough and Other (See Comments)     Other Reaction(s): Cough, Cough-Intolerance, Other (See Comments)    cough    cough   cough   cough   . Other Other (See Comments) and Nausea And Vomiting     Causes sneezing, itchy/runny nose    Other Reaction(s): GI Intolerance    unknown    unknown, unknown    unknown   unknown    Causes sneezing, itchy/runny nose   Causes sneezing, itchy/runny nose   Causes sneezing, itchy/runny nose   . Sulfa Antibiotics Nausea And Vomiting, Other (See Comments) and Rash     Other Reaction(s): GI Intolerance, Other- (not listed) - Allergy, Unknown    Unknown   . Sulfasalazine Rash   . Sulfur Itching and Nausea Only       Problem List:    Patient Active Problem List   Diagnosis Code   . HTN (hypertension) I10   . HLD (hyperlipidemia) E78.5   . Coronary atherosclerosis of native coronary artery I25.10   . History of TIA (transient ischemic attack) Z86.73   . 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 OSA (obstructive sleep apnea) G47.33   . Atrial fibrillation with rapid ventricular response (HCC) I48.91   . Stage 3 chronic kidney disease (HCC) N18.30   . COVID U07.1   . Moderate asthma with exacerbation J45.901   . 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 COVID-19 virus infection U07.1   . Pancytopenia (HCC) D61.818   . Severe sepsis (HCC) A41.9, R65.20   . Atrial fibrillation (HCC) I48.91   . Bilateral pneumonia J18.9   . Acute respiratory failure with hypoxia (HCC) J96.01   . Aspiration pneumonia (HCC) J69.0   . History of B-cell lymphoma Z85.72   . DNR (do not resuscitate) Z66   . Oropharyngeal dysphagia R13.12   . Dysphagia R13.10       Past Medical History:        Diagnosis Date   . Arthritis    . Asthma    . Chronic bronchitis (HCC)    . Hypertension    . Lung cancer [facility])        Past Surgical History:  No past surgical history on file.    Social History:    Social History     Tobacco Use   . Smoking status: Former     Current packs/day: 0.00      Average packs/day: 1 pack/day for 34.0 years (34.0 ttl pk-yrs)     Types: Cigarettes     Start date: 05/31/1954     Quit date: 1990     Years since quitting: 35.1   . Smokeless tobacco: Never   Substance Use Topics   . Alcohol use: Never                                Counseling given: Not Answered      Vital Signs (Current):   Vitals:    07/17/23 1724 07/17/23 1953 07/17/23 2157 07/18/23 0716   BP: (!) 114/53 98/60  Marland Kitchen)  145/67   Pulse: 78 78 87 72   Resp:  18 16 17    Temp:  97.9 F (36.6 C)  97.2 F (36.2 C)   TempSrc:  Oral  Oral   SpO2: 98% 96% 97% 97%   Weight:       Height:                                                  BP Readings from Last 3 Encounters:   07/18/23 (!) 145/67   07/03/23 133/75   06/20/23 115/79       NPO Status:                                                                                 BMI:   Wt Readings from Last 3 Encounters:   07/04/23 81.6 kg (180 lb)   06/29/23 82.2 kg (181 lb 4.8 oz)   06/20/23 85.8 kg (189 lb 3.2 oz)     Body mass index is 29.05 kg/m.    CBC:   Lab Results   Component Value Date/Time    WBC 6.4 07/18/2023 06:16 AM    RBC 2.85 07/18/2023 06:16 AM    HGB 8.1 07/18/2023 06:16 AM    HCT 24.7 07/18/2023 06:16 AM    MCV 86.7 07/18/2023 06:16 AM    RDW 15.5 07/18/2023 06:16 AM    PLT 154 07/18/2023 06:16 AM       CMP:   Lab Results   Component Value Date/Time    NA 137 07/18/2023 06:16 AM    K 4.7 07/18/2023 06:16 AM    CL 104 07/18/2023 06:16 AM    CO2 23 07/18/2023 06:16 AM    BUN 38 07/18/2023 06:16 AM    CREATININE 1.51 07/18/2023 06:16 AM    LABGLOM 43 07/18/2023 06:16 AM    GLUCOSE 111 07/18/2023 06:16 AM    CALCIUM 8.9 07/18/2023 06:16 AM    BILITOT 0.6 07/04/2023 12:18 PM    ALKPHOS 72 07/04/2023 12:18 PM    AST 30 07/04/2023 12:18 PM    ALT 43 07/04/2023 12:18 PM       POC Tests: No results for input(s): "POCGLU", "POCNA", "POCK", "POCCL", "POCBUN", "POCHEMO", "POCHCT" in the last 72 hours.    Coags:   Lab Results   Component Value Date/Time    PROTIME 15.1  06/29/2023 10:15 AM    INR 1.2 06/29/2023 10:15 AM    APTT 33.9 06/29/2023 10:15 AM       HCG (If Applicable): No results found for: "PREGTESTUR", "PREGSERUM", "HCG", "HCGQUANT"     ABGs: No results found for: "PHART", "PO2ART", "PCO2ART", "HCO3ART", "BEART", "O2SATART"     Type & Screen (If Applicable):  No results found for: "ABORH", "LABANTI"    Drug/Infectious Status (If Applicable):  No results found for: "HIV", "HEPCAB"    COVID-19 Screening (If Applicable):   Lab Results   Component Value Date/Time    COVID19 Detected 06/28/2023 12:15 PM           Anesthesia  Evaluation  Patient summary reviewed and Nursing notes reviewed  Airway: Mallampati: I  TM distance: >3 FB   Neck ROM: limited  Comment: + nco2  Mouth opening: > = 3 FB   Dental: normal exam         Pulmonary:   (+) pneumonia (bilateral pneumonia, consistent with CAP and aspiration pneumonia):     sleep apnea:   decreased breath sounds    asthma:                           ROS comment: Covid + 06/28/23    Acute respiratory failure,down to 2L Nco2, on inhaler therapy, mucinex    Pneumonia, severe sepsis. Improving    Former smoker  PE comment: Slightly labored breathing Cardiovascular:  Exercise tolerance: poor (<4 METS)  (+) hypertension:, dysrhythmias: atrial fibrillation, CHF: systolic and diastolic, hyperlipidemia      ECG reviewed  Rhythm: regular  Rate: normal  Echocardiogram reviewed               ROS comment: Echo 2025   Left Ventricle: Moderately reduced left ventricular systolic function with a visually estimated EF of 30 - 35%. Left ventricle is moderately dilated. Normal wall thickness. Grade II diastolic dysfunction with increased LAP.    Aortic Valve: Mild regurgitation. Mild stenosis of the aortic valve (DI ~0.44).    Mitral Valve: Mild to moderate regurgitation with a posterior directed jet.    Left Atrium: Left atrium is mildly dilated.       Neuro/Psych:   (+) TIA            GI/Hepatic/Renal:   (+) renal disease: CRI         ROS comment:  Dysphagia, hx of aspiration pneumonia. .   Endo/Other:    (+) blood dyscrasia (hgb 8,1): anemia:..                 Abdominal:             Vascular: negative vascular ROS.         Other Findings:         Anesthesia Plan      TIVA     ASA 3 - emergent             Anesthetic plan and risks discussed with patient.      Plan discussed with CRNA.                Al Bracewell Lillia [place], MD   07/18/2023

## 2023-07-19 LAB — CBC WITH AUTO DIFFERENTIAL
Basophils %: 0.6 % (ref 0.0–2.0)
Basophils Absolute: 0.03 10*3/uL (ref 0.00–0.20)
Eosinophils %: 0.2 % — ABNORMAL LOW (ref 0.5–7.8)
Eosinophils Absolute: 0.01 10*3/uL (ref 0.00–0.80)
Hematocrit: 21.4 % — ABNORMAL LOW (ref 41.1–50.3)
Hemoglobin: 7.1 g/dL — ABNORMAL LOW (ref 13.6–17.2)
Immature Granulocytes %: 4.3 % (ref 0.0–5.0)
Immature Granulocytes Absolute: 0.21 10*3/uL (ref 0.0–0.5)
Lymphocytes %: 9.3 % — ABNORMAL LOW (ref 13.0–44.0)
Lymphocytes Absolute: 0.45 10*3/uL — ABNORMAL LOW (ref 0.50–4.60)
MCH: 28.6 pg (ref 26.1–32.9)
MCHC: 33.2 g/dL (ref 31.4–35.0)
MCV: 86.3 fL (ref 82.0–102.0)
MPV: 12.1 fL (ref 9.4–12.3)
Monocytes %: 14.7 % — ABNORMAL HIGH (ref 4.0–12.0)
Monocytes Absolute: 0.71 10*3/uL (ref 0.10–1.30)
Neutrophils %: 70.9 % (ref 43.0–78.0)
Neutrophils Absolute: 3.42 10*3/uL (ref 1.70–8.20)
Platelets: 120 10*3/uL — ABNORMAL LOW (ref 150–450)
RBC: 2.48 M/uL — ABNORMAL LOW (ref 4.23–5.6)
RDW: 15.7 % — ABNORMAL HIGH (ref 11.9–14.6)
WBC: 4.8 10*3/uL (ref 4.3–11.1)
nRBC: 0 10*3/uL (ref 0.0–0.2)

## 2023-07-19 LAB — BASIC METABOLIC PANEL
Anion Gap: 11 mmol/L (ref 7–16)
BUN: 37 mg/dL — ABNORMAL HIGH (ref 8–23)
CO2: 22 mmol/L (ref 20–29)
Calcium: 8.4 mg/dL — ABNORMAL LOW (ref 8.8–10.2)
Chloride: 99 mmol/L (ref 98–107)
Creatinine: 1.31 mg/dL — ABNORMAL HIGH (ref 0.80–1.30)
Est, Glom Filt Rate: 51 mL/min/{1.73_m2} — ABNORMAL LOW (ref >60)
Glucose: 101 mg/dL — ABNORMAL HIGH (ref 70–99)
Potassium: 4.6 mmol/L (ref 3.5–5.1)
Sodium: 132 mmol/L — ABNORMAL LOW (ref 136–145)

## 2023-07-19 MED ORDER — ACETYLCYSTEINE 20 % IN SOLN
20 | Freq: Two times a day (BID) | RESPIRATORY_TRACT | Status: AC
Start: 2023-07-19 — End: 2023-07-20
  Administered 2023-07-20 – 2023-07-21 (×3): 600 mg via RESPIRATORY_TRACT

## 2023-07-19 MED ORDER — ACETYLCYSTEINE 20 % IN SOLN
20 | Freq: Two times a day (BID) | RESPIRATORY_TRACT | Status: DC
Start: 2023-07-19 — End: 2023-07-19

## 2023-07-19 MED FILL — CARVEDILOL 6.25 MG PO TABS: 6.25 MG | ORAL | Qty: 1

## 2023-07-19 MED FILL — ALBUTEROL SULFATE (2.5 MG/3ML) 0.083% IN NEBU: RESPIRATORY_TRACT | Qty: 3

## 2023-07-19 MED FILL — ELIQUIS 5 MG PO TABS: 5 MG | ORAL | Qty: 1

## 2023-07-19 MED FILL — NYSTATIN 100000 UNIT/ML MT SUSP: 100000 UNIT/ML | OROMUCOSAL | Qty: 5

## 2023-07-19 MED FILL — FUROSEMIDE 20 MG PO TABS: 20 MG | ORAL | Qty: 1

## 2023-07-19 MED FILL — CULTURELLE PO CAPS: ORAL | Qty: 1

## 2023-07-19 MED FILL — PANTOPRAZOLE SODIUM 40 MG PO TBEC: 40 MG | ORAL | Qty: 1

## 2023-07-19 MED FILL — ARFORMOTEROL TARTRATE 15 MCG/2ML IN NEBU: 152 MCG/2ML | RESPIRATORY_TRACT | Qty: 2

## 2023-07-19 MED FILL — ROSUVASTATIN CALCIUM 5 MG PO TABS: 5 MG | ORAL | Qty: 2

## 2023-07-19 MED FILL — GUAIFENESIN ER 600 MG PO TB12: 600 MG | ORAL | Qty: 1

## 2023-07-19 MED FILL — IPRATROPIUM-ALBUTEROL 0.5-2.5 (3) MG/3ML IN SOLN: 0.5-2.533 (3) MG/3ML | RESPIRATORY_TRACT | Qty: 3

## 2023-07-19 MED FILL — ACETYLCYSTEINE 20 % IN SOLN: 20 % | RESPIRATORY_TRACT | Qty: 4

## 2023-07-19 MED FILL — DOXAZOSIN MESYLATE 4 MG PO TABS: 4 MG | ORAL | Qty: 1

## 2023-07-19 NOTE — Progress Notes (Addendum)
 PT note:  Pt refused x 3 to get up in the chair or exercise.  Pt stated he was having a hard time breathing.  Therapist explain to pt that if you get up in the chair it will help you breath.  Still said no, my daughter is on the way.  Nurse told therapist Dr. Suzie Portela wanted pt in the chair as much as possible.  Therapist talk to pt about what Dr. Suzie Portela said.  Pt still said no.  Will try back tomorrow.

## 2023-07-19 NOTE — Care Coordination-Inpatient (Addendum)
 Pt chart reviewed and discussed in AM IDR for continued stay. LOS 15 days. Pt admitted with acute respiratory failure with hypoxia and severe sepsis. Per MD, pt improving; 6-minute walk-test today for potential home 02 needs; pulmonology following; medically stable for discharge.     CM consulted for DME of chest percussion vest. CM contacted liaison Ray with [facility] regarding referral.     CM notified respiratory of chest percussion vest in-house to trail.     SW CM contacted pt's daughter Clarene Critchley to discuss discharge planning. CM notified Eve pt stable for discharge today. CM discussed adding MSW to [employer] order to assist with additional resources. Daughter verbalized agreement with adding MSW to [facility]. Daughter plans to come to hospital around lunch time with questions for CM.    DC/POC to return home with daughter and [facility] PT/OT/RN/SLP/aide/MSW services.     CM team will continue to follow for potential discharge needs that may arise.     Gardiner Fanti, MSW, LBSW  Social Work Case Software engineer. [place]

## 2023-07-19 NOTE — Progress Notes (Signed)
 This RT had every intention of setting up Thera-vest for patient.  I discovered the machine and the vests, but the hoses are missing.  I informed my manager via text of this problem.

## 2023-07-19 NOTE — Progress Notes (Signed)
 Paul Medina  Admission Date: 07/04/2023         Daily Progress Note: 07/19/2023    The patient's chart is reviewed and the patient is discussed with the staff.    Background: 89 y.o. male with chronic combined heart failure, PAF on Eliquis, HTN, CAD, history of TIA, left eye blindness, OSA on CPAP, and CKD stage III admitted on 2/11 due to severe sepsis and acute respiratory failure from bilateral pneumonia consistent with aspiration and hospital-acquired pneumonia. He also had recently been diagnosed with COVID and treated   Subjective:     Now on 2 L/min of supplemental oxygen and saturation is 98%.  Yesterday was having some foul sputum production.  Says they lost his sputum sample yesterday.    EGD planned for dysphagia, possible dilation today.    Current Facility-Administered Medications   Medication Dose Route Frequency    saliva substitute (BIOTENE/MOUTH KOTE) liquid   Oral TID PRN    magic (miracle) mouthwash  10 mL Swish & Spit 4x Daily PRN    nystatin (MYCOSTATIN) 100000 UNIT/ML suspension 500,000 Units  5 mL Oral 4x Daily    apixaban (ELIQUIS) tablet 5 mg  5 mg Oral 2 times per day    lactobacillus (CULTURELLE) capsule 1 capsule  1 capsule Oral Daily with breakfast    prednisoLONE acetate (PRED FORTE) 1 % ophthalmic suspension 1 drop  1 drop Left Eye Daily    medicated lip ointment (BLISTEX)   Topical PRN    albuterol (PROVENTIL) (2.5 MG/3ML) 0.083% nebulizer solution 2.5 mg  2.5 mg Nebulization Q4H PRN    ipratropium 0.5 mg-albuterol 2.5 mg (DUONEB) nebulizer solution 1 Dose  1 Dose Inhalation BID RT    rosuvastatin (CRESTOR) tablet 10 mg  10 mg Oral Nightly    sodium chloride (Inhalant) 3 % nebulizer solution 4 mL  4 mL Nebulization PRN    arformoterol tartrate (BROVANA) nebulizer solution 15 mcg  15 mcg Nebulization BID RT    atropine 1 % ophthalmic solution 1 drop  1 drop Left Eye Nightly    brimonidine (ALPHAGAN) 0.2 % ophthalmic solution 1 drop  1 drop Left Eye TID    sodium  chloride flush 0.9 % injection 5-40 mL  5-40 mL IntraVENous 2 times per day    sodium chloride flush 0.9 % injection 5-40 mL  5-40 mL IntraVENous PRN    0.9 % sodium chloride infusion   IntraVENous PRN    potassium chloride (KLOR-CON M) extended release tablet 40 mEq  40 mEq Oral PRN    Or    potassium bicarb-citric acid (EFFER-K) effervescent tablet 40 mEq  40 mEq Oral PRN    Or    potassium chloride 10 mEq/100 mL IVPB (Peripheral Line)  10 mEq IntraVENous PRN    magnesium sulfate 2000 mg in 50 mL IVPB premix  2,000 mg IntraVENous PRN    ondansetron (ZOFRAN-ODT) disintegrating tablet 4 mg  4 mg Oral Q8H PRN    Or    ondansetron (ZOFRAN) injection 4 mg  4 mg IntraVENous Q6H PRN    polyethylene glycol (GLYCOLAX) packet 17 g  17 g Oral Daily PRN    acetaminophen (TYLENOL) tablet 650 mg  650 mg Oral Q6H PRN    Or    acetaminophen (TYLENOL) suppository 650 mg  650 mg Rectal Q6H PRN    guaiFENesin (MUCINEX) extended release tablet 600 mg  600 mg Oral BID    carvedilol (COREG) tablet 6.25 mg  6.25 mg Oral  BID WC    doxazosin (CARDURA) tablet 4 mg  4 mg Oral Daily    pantoprazole (PROTONIX) tablet 40 mg  40 mg Oral QAM AC    furosemide (LASIX) tablet 20 mg  20 mg Oral Daily     Review of Systems: Unable to obtain due to patient factors.   Objective:   Blood pressure 134/64, pulse 67, temperature 98.2 F (36.8 C), temperature source Tympanic, resp. rate 16, height 1.676 m (5\' 6" ), weight 81.6 kg (180 lb), SpO2 98%.   Intake/Output Summary (Last 24 hours) at 07/19/2023 0805  Last data filed at 07/18/2023 2350  Gross per 24 hour   Intake 400 ml   Output 750 ml   Net -350 ml     Physical Exam:   Constitutional:  the patient is well developed and in no acute distress  EENMT:  Sclera clear, pupils equal, oral mucosa moist  Respiratory: symmetric chest rise. Loud audible rhonchi  Cardiovascular:  RRR without M,G,R. There is no edema  Gastrointestinal: soft and non-tender; with positive bowel sounds.  Musculoskeletal: warm without  cyanosis. Normal muscle tone.   Skin:  no jaundice or rashes, no wounds  Neurologic: symmetric strength, fluent speech  Psychiatric:  calm, appropriate, oriented x 4    Imaging: I performed an independent interpretation of the patient's images.  CXR:          CT from now vs 2 weeks vs 3 weeks. Clearing left base and some on right but diffuse infitrates/tree and bud in both lung.           LAB:  Recent Labs     07/18/23  0616 07/19/23  0627   WBC 6.4 4.8   HGB 8.1* 7.1*   HCT 24.7* 21.4*   PLT 154 120*     Recent Labs     07/17/23  0642 07/18/23  0616 07/19/23  0627   NA 136 137 132*   K 5.2* 4.7 4.6   CL 103 104 99   CO2 21 23 22    BUN 36* 38* 37*   CREATININE 1.31* 1.51* 1.31*   MG 2.2 2.2  --    PHOS 3.7 3.8  --      No results for input(s): "TROPHS", "NTPROBNP", "CRP", "ESR" in the last 72 hours.  Recent Labs     07/17/23  0642 07/18/23  0616 07/19/23  0627   GLUCOSE 99 111* 101*      Microbiology:   @MICRORESULTS @  No results for input(s): "CULTURE" in the last 72 hours.  No results for input(s): "COVID19" in the last 72 hours.  ECHO: 06/28/23    ECHO (TTE) COMPLETE (PRN CONTRAST/BUBBLE/STRAIN/3D) 06/29/2023  3:43 PM (Final)    Interpretation Summary    Left Ventricle: Moderately reduced left ventricular systolic function with a visually estimated EF of 30 - 35%. Left ventricle is moderately dilated. Normal wall thickness. Grade II diastolic dysfunction with increased LAP.    Aortic Valve: Mild regurgitation. Mild stenosis of the aortic valve (DI ~0.44).    Mitral Valve: Mild to moderate regurgitation with a posterior directed jet.    Left Atrium: Left atrium is mildly dilated.    Image quality is adequate. Contrast used: Lumason.    Signed by: Lenna Gilford, MD on 06/29/2023  3:43 PM    Assessment and Plan:  (Medical Decision Making)   Mucus plugging  Will add mucomyst nebs.  Encourage him to get out of bed to chair and walk as  much as possible.  Using flutter valve.  Add vest if available.    Aspiration  pneumonia  CT reviewed.  Has had appropriate abx.  Collect sputum cultures    Hypoxia  Wean o2 as able        More than 50% of the time documented was spent in face-to-face contact with the patient and in the care of the patient on the floor/unit where the patient is located.    Harriett Rush, MD

## 2023-07-19 NOTE — Progress Notes (Signed)
 Hospitalist Progress Note   Admit Date:  07/04/2023 11:46 AM   Name:  Paul Medina   Age:  89 y.o.  Sex:  male  DOB:  [birth year]   MRN:  [id number]   Room:  357/01    Presenting/Chief Complaint: Shortness of Breath     Reason(s) for Admission: Severe sepsis (HCC) [A41.9, R65.20]  Pneumonia of right lung due to infectious organism, unspecified part of lung [J18.9]  Sepsis, due to unspecified organism, unspecified whether acute organ dysfunction present [facility]) [A41.9]     Hospital Day #15      Hospital Course:   Paul Medina is a 89 y.o. male with chronic combined heart failure, PAF on Eliquis, HTN, CAD, history of TIA, left eye blindness, OSA on CPAP, and CKD stage III admitted on 2/11 due to severe sepsis and acute respiratory failure from bilateral pneumonia consistent with aspiration and hospital-acquired pneumonia.  He also had recently been diagnosed with COVID.  2/11 CT chest with worsening right sided infiltrates and improving left sided infiltrates.    Subjective & 24hr Events:   Patient reclining in bed at time of visit.  Endorses that he is feeling comfortable.  Denies increased work of breathing, shortness of breath, fever.  Still coughing up thick tan-colored sputum.    Assessment & Plan:     Bilateral aspiration pneumonia/severe sepsis  Afebrile.  WBC normalized.  Completed Augmentin and azithromycin on 2/19.    Dysphagia  Status-post EGD on 2/25, showed small hiatal hernia and Schatzki's ring which was dilated.  Speech therapy reevaluated yesterday and recommended minced and moist diet with thin liquids.    COVID-19 virus infection  Continue dexamethasone x 10 days.    Acute hypoxic respiratory failure  Supplemental O2 need requirement little changed in recent days.  Pulmonology following, case discussed with Dr. Suzie Portela today.  It was recommended that inpatient care be continued while his medication regimen and clinical condition continued to be further optimized.  Continue to wean  supplemental O2 off as tolerated.  Will obtain 6-minute walk test prior to eventual discharge.    Chronic kidney disease stage III  Renal function baseline.    Essential hypertension  Continue carvedilol.    Chronic combined systolic and diastolic congestive heart failure  Continue Lasix daily.    Paroxysmal atrial fibrillation  Remains on beta-blocker and Eliquis.      Anticipated Discharge Arrangements:   Skilled Nursing Facility, however family wishes to take him home with home health services, to which the patient is in agreement.  Timing of discharge pending further clinical improvement in respiratory status and consultation with pulmonology.    PT/OT evals ordered?  Therapy evals ordered  Diet:  ADULT DIET; Dysphagia - Minced and Moist  ADULT ORAL NUTRITION SUPPLEMENT; Breakfast, Lunch, Dinner; Frozen Oral Supplement  ADULT ORAL NUTRITION SUPPLEMENT; Breakfast, Lunch, Dinner; Educational psychologist Protein Oral Supplement  VTE prophylaxis: Already on anticoagulation  Code status: DNR      Non-peripheral Lines and Tubes (if present):          Telemetry (if present):  Cardiac/Telemetry Monitor On: No        Hospital Problems:  Principal Problem:    Acute respiratory failure with hypoxia (HCC)  Active Problems:    Chronic combined systolic and diastolic heart failure (HCC)    Primary hypertension    HLD (hyperlipidemia)    Coronary atherosclerosis of native coronary artery    History of TIA (transient ischemic attack)    OSA (  obstructive sleep apnea)    Stage 3 chronic kidney disease (HCC)    COVID-19 virus infection    Severe sepsis (HCC)    Paroxysmal atrial fibrillation (HCC)    Aspiration pneumonia of both lungs (HCC)    History of B-cell lymphoma    DNR (do not resuscitate)    Oropharyngeal dysphagia    Pneumonia of right lung due to infectious organism  Resolved Problems:    * No resolved hospital problems. *      Objective:   Patient Vitals for the past 24 hrs:   Temp Pulse Resp BP SpO2   07/19/23 0709  98.2 F (36.8 C) 67 16 134/64 98 %   07/18/23 2212 -- 69 16 -- 98 %   07/18/23 2055 -- 75 -- -- --   07/18/23 1955 97.7 F (36.5 C) 78 18 107/61 97 %   07/18/23 1710 -- 71 -- (!) 114/59 --   07/18/23 1344 98.1 F (36.7 C) 71 19 (!) 114/59 98 %   07/18/23 1321 -- 69 20 -- 97 %   07/18/23 1320 -- 70 21 122/66 97 %   07/18/23 1315 -- 70 19 135/68 97 %   07/18/23 1310 -- 67 16 118/66 96 %   07/18/23 1305 -- 72 21 124/65 98 %   07/18/23 1300 -- 68 16 126/67 98 %   07/18/23 1255 -- 71 17 136/67 99 %   07/18/23 1250 -- 75 21 130/76 98 %   07/18/23 1246 98 F (36.7 C) -- -- -- --   07/18/23 1245 -- 74 22 127/70 98 %   07/18/23 1240 -- 77 22 130/69 97 %   07/18/23 1235 -- 70 21 (!) 145/76 100 %   07/18/23 1230 98.1 F (36.7 C) 69 17 (!) 153/76 100 %   07/18/23 0950 -- 74 -- (!) 145/67 --   07/18/23 0759 -- 74 -- -- 97 %       Oxygen Therapy  SpO2: 98 %  Pulse via Oximetry: 69 beats per minute  Pulse Oximeter Device Mode: Intermittent  Pulse Oximeter Device Location: Left, Finger  O2 Device: Nasal cannula  Skin Assessment: Clean, dry, & intact  FiO2 : 32 %  O2 Flow Rate (L/min): 2 L/min    Estimated body mass index is 29.05 kg/m as calculated from the following:    Height as of this encounter: 1.676 m (5\' 6" ).    Weight as of this encounter: 81.6 kg (180 lb).    Intake/Output Summary (Last 24 hours) at 07/19/2023 0736  Last data filed at 07/18/2023 2350  Gross per 24 hour   Intake 400 ml   Output 750 ml   Net -350 ml         Physical Exam:   General:    Well nourished elderly male reclining in bed, NAD  Head:  Normocephalic, atraumatic  Eyes:  Sclerae appear normal.  Pupils equally round.  ENT:  Nares appear normal.  Moist oral mucosa.  Neck:  No restricted ROM.  Trachea midline.   CV:   RRR.  No m/r/g.  No jugular venous distension.  No LE edema.  Lungs:   Pharyngeal secretions audible at bedside.  Scattered rhonchi throughout the chest.  Symmetric expansion.  3 L O2 BNC, SpO2 97%.  Abdomen:   Soft, nontender,  nondistended.  Skin:     No rashes.  Normal coloration.   Warm and dry.    Neuro:  AAOx3.  CN II-XII grossly  intact.    Psych:  Normal mood and affect.      I have personally reviewed labs and tests:  Recent Labs:  Recent Results (from the past 48 hour(s))   Magnesium    Collection Time: 07/18/23  6:16 AM   Result Value Ref Range    Magnesium 2.2 1.8 - 2.4 mg/dL   Phosphorus    Collection Time: 07/18/23  6:16 AM   Result Value Ref Range    Phosphorus 3.8 2.5 - 4.5 MG/DL   Basic Metabolic Panel    Collection Time: 07/18/23  6:16 AM   Result Value Ref Range    Sodium 137 136 - 145 mmol/L    Potassium 4.7 3.5 - 5.1 mmol/L    Chloride 104 98 - 107 mmol/L    CO2 23 20 - 29 mmol/L    Anion Gap 10 7 - 16 mmol/L    Glucose 111 (H) 70 - 99 mg/dL    BUN 38 (H) 8 - 23 MG/DL    Creatinine 1.61 (H) 0.80 - 1.30 MG/DL    Est, Glom Filt Rate 43 (L) >60 ml/min/1.24m2    Calcium 8.9 8.8 - 10.2 MG/DL   CBC with Auto Differential    Collection Time: 07/18/23  6:16 AM   Result Value Ref Range    WBC 6.4 4.3 - 11.1 K/uL    RBC 2.85 (L) 4.23 - 5.6 M/uL    Hemoglobin 8.1 (L) 13.6 - 17.2 g/dL    Hematocrit 09.6 (L) 41.1 - 50.3 %    MCV 86.7 82.0 - 102.0 FL    MCH 28.4 26.1 - 32.9 PG    MCHC 32.8 31.4 - 35.0 g/dL    RDW 04.5 (H) 40.9 - 14.6 %    Platelets 154 150 - 450 K/uL    MPV 12.1 9.4 - 12.3 FL    nRBC 0.00 0.0 - 0.2 K/uL    Differential Type AUTOMATED      Neutrophils % 78.5 (H) 43.0 - 78.0 %    Lymphocytes % 6.4 (L) 13.0 - 44.0 %    Monocytes % 9.8 4.0 - 12.0 %    Eosinophils % 0.3 (L) 0.5 - 7.8 %    Basophils % 0.5 0.0 - 2.0 %    Immature Granulocytes % 4.5 0.0 - 5.0 %    Neutrophils Absolute 5.05 1.70 - 8.20 K/UL    Lymphocytes Absolute 0.41 (L) 0.50 - 4.60 K/UL    Monocytes Absolute 0.63 0.10 - 1.30 K/UL    Eosinophils Absolute 0.02 0.00 - 0.80 K/UL    Basophils Absolute 0.03 0.00 - 0.20 K/UL    Immature Granulocytes Absolute 0.29 0.0 - 0.5 K/UL       Current Meds:  Current Facility-Administered Medications   Medication Dose Route  Frequency    saliva substitute (BIOTENE/MOUTH KOTE) liquid   Oral TID PRN    magic (miracle) mouthwash  10 mL Swish & Spit 4x Daily PRN    nystatin (MYCOSTATIN) 100000 UNIT/ML suspension 500,000 Units  5 mL Oral 4x Daily    apixaban (ELIQUIS) tablet 5 mg  5 mg Oral 2 times per day    lactobacillus (CULTURELLE) capsule 1 capsule  1 capsule Oral Daily with breakfast    prednisoLONE acetate (PRED FORTE) 1 % ophthalmic suspension 1 drop  1 drop Left Eye Daily    medicated lip ointment (BLISTEX)   Topical PRN    albuterol (PROVENTIL) (2.5 MG/3ML) 0.083% nebulizer solution 2.5 mg  2.5 mg  Nebulization Q4H PRN    ipratropium 0.5 mg-albuterol 2.5 mg (DUONEB) nebulizer solution 1 Dose  1 Dose Inhalation BID RT    rosuvastatin (CRESTOR) tablet 10 mg  10 mg Oral Nightly    sodium chloride (Inhalant) 3 % nebulizer solution 4 mL  4 mL Nebulization PRN    arformoterol tartrate (BROVANA) nebulizer solution 15 mcg  15 mcg Nebulization BID RT    atropine 1 % ophthalmic solution 1 drop  1 drop Left Eye Nightly    brimonidine (ALPHAGAN) 0.2 % ophthalmic solution 1 drop  1 drop Left Eye TID    sodium chloride flush 0.9 % injection 5-40 mL  5-40 mL IntraVENous 2 times per day    sodium chloride flush 0.9 % injection 5-40 mL  5-40 mL IntraVENous PRN    0.9 % sodium chloride infusion   IntraVENous PRN    potassium chloride (KLOR-CON M) extended release tablet 40 mEq  40 mEq Oral PRN    Or    potassium bicarb-citric acid (EFFER-K) effervescent tablet 40 mEq  40 mEq Oral PRN    Or    potassium chloride 10 mEq/100 mL IVPB (Peripheral Line)  10 mEq IntraVENous PRN    magnesium sulfate 2000 mg in 50 mL IVPB premix  2,000 mg IntraVENous PRN    ondansetron (ZOFRAN-ODT) disintegrating tablet 4 mg  4 mg Oral Q8H PRN    Or    ondansetron (ZOFRAN) injection 4 mg  4 mg IntraVENous Q6H PRN    polyethylene glycol (GLYCOLAX) packet 17 g  17 g Oral Daily PRN    acetaminophen (TYLENOL) tablet 650 mg  650 mg Oral Q6H PRN    Or    acetaminophen (TYLENOL)  suppository 650 mg  650 mg Rectal Q6H PRN    guaiFENesin (MUCINEX) extended release tablet 600 mg  600 mg Oral BID    carvedilol (COREG) tablet 6.25 mg  6.25 mg Oral BID WC    doxazosin (CARDURA) tablet 4 mg  4 mg Oral Daily    pantoprazole (PROTONIX) tablet 40 mg  40 mg Oral QAM AC    furosemide (LASIX) tablet 20 mg  20 mg Oral Daily       Signed:  Norval Morton, M.D.  Hospitalist  07/19/23   7:36 AM

## 2023-07-19 NOTE — Progress Notes (Signed)
 Occupational Therapy Note:    Attempted to see patient twice this AM for occupational therapy treatment  session. Patient declined oob. Tried to encourage to get up to the chair to eat he declined. Will follow and re-attempt as schedule permits/patient available. Thank you,    Hilario Quarry, OT    Rehab Caseload Tracker

## 2023-07-19 NOTE — Plan of Care (Signed)
 Problem: Discharge Planning  Goal: Discharge to home or other facility with appropriate resources  Outcome: Progressing     Problem: Safety - Adult  Goal: Free from fall injury  Outcome: Progressing     Problem: ABCDS Injury Assessment  Goal: Absence of physical injury  Outcome: Progressing     Problem: Respiratory - Adult  Goal: Achieves optimal ventilation and oxygenation  Outcome: Progressing     Problem: Skin/Tissue Integrity  Goal: Skin integrity remains intact  Description: 1.  Monitor for areas of redness and/or skin breakdown  2.  Assess vascular access sites hourly  3.  Every 4-6 hours minimum:  Change oxygen saturation probe site  4.  Every 4-6 hours:  If on nasal continuous positive airway pressure, respiratory therapy assess nares and determine need for appliance change or resting period  Outcome: Progressing

## 2023-07-20 LAB — CBC WITH AUTO DIFFERENTIAL
Basophils %: 0.6 % (ref 0.0–2.0)
Basophils Absolute: 0.04 10*3/uL (ref 0.00–0.20)
Eosinophils %: 0.2 % — ABNORMAL LOW (ref 0.5–7.8)
Eosinophils Absolute: 0.01 10*3/uL (ref 0.00–0.80)
Hematocrit: 23.4 % — ABNORMAL LOW (ref 41.1–50.3)
Hemoglobin: 8.1 g/dL — ABNORMAL LOW (ref 13.6–17.2)
Immature Granulocytes %: 4.2 % (ref 0.0–5.0)
Immature Granulocytes Absolute: 0.26 10*3/uL (ref 0.0–0.5)
Lymphocytes %: 10.2 % — ABNORMAL LOW (ref 13.0–44.0)
Lymphocytes Absolute: 0.63 10*3/uL (ref 0.50–4.60)
MCH: 29.6 pg (ref 26.1–32.9)
MCHC: 34.6 g/dL (ref 31.4–35.0)
MCV: 85.4 FL (ref 82.0–102.0)
MPV: 12.1 FL (ref 9.4–12.3)
Monocytes %: 15.2 % — ABNORMAL HIGH (ref 4.0–12.0)
Monocytes Absolute: 0.94 10*3/uL (ref 0.10–1.30)
Neutrophils %: 69.6 % (ref 43.0–78.0)
Neutrophils Absolute: 4.29 10*3/uL (ref 1.70–8.20)
Platelets: 122 10*3/uL — ABNORMAL LOW (ref 150–450)
RBC: 2.74 M/uL — ABNORMAL LOW (ref 4.23–5.6)
RDW: 15.5 % — ABNORMAL HIGH (ref 11.9–14.6)
WBC: 6.2 10*3/uL (ref 4.3–11.1)
nRBC: 0 10*3/uL (ref 0.0–0.2)

## 2023-07-20 MED ORDER — GUAIFENESIN ER 600 MG PO TB12
600 | Freq: Two times a day (BID) | ORAL | Status: DC
Start: 2023-07-20 — End: 2023-07-21
  Administered 2023-07-20 – 2023-07-21 (×3): 1200 mg via ORAL

## 2023-07-20 MED ORDER — SODIUM CHLORIDE 3 % IN NEBU
3 | Freq: Two times a day (BID) | RESPIRATORY_TRACT | Status: DC
Start: 2023-07-20 — End: 2023-07-21
  Administered 2023-07-21 (×2): 4 mL via RESPIRATORY_TRACT

## 2023-07-20 MED FILL — FUROSEMIDE 20 MG PO TABS: 20 MG | ORAL | Qty: 1

## 2023-07-20 MED FILL — IPRATROPIUM-ALBUTEROL 0.5-2.5 (3) MG/3ML IN SOLN: 0.5-2.533 (3) MG/3ML | RESPIRATORY_TRACT | Qty: 3

## 2023-07-20 MED FILL — GUAIFENESIN ER 600 MG PO TB12: 600 MG | ORAL | Qty: 2

## 2023-07-20 MED FILL — ARFORMOTEROL TARTRATE 15 MCG/2ML IN NEBU: 152 MCG/2ML | RESPIRATORY_TRACT | Qty: 2

## 2023-07-20 MED FILL — GUAIFENESIN ER 600 MG PO TB12: 600 MG | ORAL | Qty: 1

## 2023-07-20 MED FILL — ACETYLCYSTEINE 20 % IN SOLN: 20 % | RESPIRATORY_TRACT | Qty: 4

## 2023-07-20 MED FILL — ELIQUIS 5 MG PO TABS: 5 MG | ORAL | Qty: 1

## 2023-07-20 MED FILL — NYSTATIN 100000 UNIT/ML MT SUSP: 100000 UNIT/ML | OROMUCOSAL | Qty: 5

## 2023-07-20 MED FILL — DOXAZOSIN MESYLATE 4 MG PO TABS: 4 MG | ORAL | Qty: 1

## 2023-07-20 MED FILL — ROSUVASTATIN CALCIUM 5 MG PO TABS: 5 MG | ORAL | Qty: 2

## 2023-07-20 MED FILL — CARVEDILOL 6.25 MG PO TABS: 6.25 MG | ORAL | Qty: 1

## 2023-07-20 MED FILL — PANTOPRAZOLE SODIUM 40 MG PO TBEC: 40 MG | ORAL | Qty: 1

## 2023-07-20 MED FILL — CULTURELLE PO CAPS: ORAL | Qty: 1

## 2023-07-20 MED FILL — SODIUM CHLORIDE 3 % IN NEBU: 3 % | RESPIRATORY_TRACT | Qty: 4

## 2023-07-20 NOTE — Discharge Instructions (Addendum)
 1) USE YOUR FLUTTER VALVE (green device) AT LEAST 10 TIMES PER HOUR WHILE AWAKE, EVERY DAY!    2) Minced and moist diet at home.    -------------------------------------------------------------------------------------------------------------    [facility]  [address]   [address]   [phone]    The pulmonary office will call you in 1-2 business days to arrange follow up in the pulmonary office. If you have not heard from the office after 2 full business days, please call the office to arrange follow up.      For airway clearance and better recovery from pneumonia:  Use oxygen as prescribed at discharge.  Use 3% saline in the nebulizer twice a day to try to encourage mucus expectoration.  Use flutter valve twice a day after the 3% saline.  Do 10 exhalations through the valve after each nebulizer treatment.  Use mucinex 1200mg  twice a day until the mucus clears up.

## 2023-07-20 NOTE — Progress Notes (Signed)
 GOALS:  LTG: Patient will maintain adequate hydration/nutrition with optimum safety and efficiency of swallowing function with PO intake without overt signs or symptoms of aspiration for the highest appropriate diet level.  STG: Goals Updated Post EGD on 07/18/2023  Patient will consume minced and moist diet with thin liquids without adverse pulmonary events to meet nutrition/hydration needs via PO modality. Ongoing 07/20/2023  Patient will utilize compensatory strategies to improve safety with PO intake without assistance. Ongoing 07/20/2023    SPEECH LANGUAGE PATHOLOGY: Dysphagia Daily Note #5    Acknowledge Order  I  Therapy Time  I   Charges     I  Rehab Caseload Tracker  NAME: Paul Medina  DOB: 01-06-1931  MRN: [id number]    ADMISSION DATE: 07/04/2023  PRIMARY DIAGNOSIS: Acute respiratory failure with hypoxia [facility])    ICD-10: Treatment Diagnosis: R13.12 Dysphagia, Oropharyngeal Phase    RECOMMENDATIONS   Diet:    Minced and Moist  Thin Liquids    Medication: whole floated in puree or applesauce and crushed in puree or applesauce, as permitted by pharmacy   Compensatory Swallowing Strategies:   Upright for all PO  Set-up assistance  Small bites and sips  May do better with cups but will defer to patient if he prefers straws with liquids (single aspiration event noted with straws during MBSS on 07/06/2023 though patient able to cough/clear contrast from trachea in response to aspiration events).    Therapeutic Intervention:   Patient/family education  Dysphagia treatment   Patient continues to require skilled intervention:  Yes. Recommend ongoing speech therapy services during this hospitalization.     Anticipated Discharge Needs: Family reports plan is to discharge to daughter's house and family in favor of home health speech therapy services given patient's swallowing difficulty during hospitalization.     Recommend home health speech therapy services post acute care.      ASSESSMENT    Dysphagia: Patient  agreeable to PO trials consuming thin liquids. Additional PO trials deferred though patient denies difficulty with minced and moist foods since diet reestablished post EGD/dilation. Oral and pharyngeal phases of swallow were unremarkable. Patient without overt signs of aspiration though patient does acknowledge need for frequent breaks due to respiratory status. Recommend to continue minced and moist diet with thin liquids.     Recommend speech therapy services post acute care.     GENERAL    Subjective: Patient awake, alert, and agreeable to speech therapy services.     Reason for Consult: concern for aspiration.     History of Present Injury/Illness: Paul Medina  has a past medical history of Arthritis, Asthma, Chronic bronchitis (HCC), Hypertension, and Lung cancer (HCC).. He also  has a past surgical history that includes Upper gastrointestinal endoscopy (N/A, 07/18/2023).  Precautions/Allergies: Ciprofloxacin, Doxycycline, Lisinopril, Other, Sulfa antibiotics, Sulfasalazine, and Sulfur     Observations:  Alertness: Alert  Drowsy  Voice: WFL  Speech: Intelligible  Expressive Language: Fluent and Able to communicate wants and needs  Receptive Language: Answers yes/no questions, Follows basic commands, and Appropriately responds to questions  Cognition: Appropriately attends to clinician    Prior Dysphagia History: Seen last week with recommendations for minced and moist solids, thin liquids due to patient report of esophageal dysphagia.   Prior Instrumental Assessment: From 07/06/2203 : Patient presents with mild to moderate oropharyngeal dysphagia characterized by diminished tongue base retraction and increased fatigue as study progressed resulting in increased vallecular residue.      Oral phase marked by prolonged prep  and transfer with mixed consistency fruit cup and course crackers in the setting of fatigue. Pharyngeal phase marked by vallecular residue after all swallows requiring double swallow to  facilitate clearance. Increased residue observed with mildly thick liquid (nectar) and course crackers, though partial clearance of vallecular residue was observed with use of liquid wash in single sips. Patient did experience single aspiration event after the swallow and single instance of deep penetration during the swallow when consuming thin liquids via straw, however reactive cough was elicited which cleared aspirated/penetrated bolus from trachea.      Recommend minced and moist solids per patient preference, thin liquids without straws, and medications whole in puree or crushed in puree. Patient with known history of esophageal dysphagia. CP bar was observed, however this does not obstruct flow of bolus.    EGD performed on 07/18/2023 with dilation performed due to Schatzki's ring. Small hiatal hernia and possible mild esophagitis noted as well.     Current Diet:  minced and moist  thin liquids     Respiratory Status: Nasal Cannula 3 LPM    Pain:  Patient does not c/o pain  Pain intervention: None- No pain observed  Pain response: Patient satisfied    OBJECTIVE    Dysphagia: Patient sitting up after being seen by PT/OT services. Agreeable to PO trials of thin liquids, consuming 4 oz of via cup and straw. Additional PO trials deferred by the patient due to fatigue. Oral phase of swallow was unremarkable. Pharyngeal phase of swallow was unremarkable as able to be assessed at bedside. Patient without overt signs of aspiration. When questioned of status, patient does acknowledge work of breathing as being a barrier for daily activities. Patient encouraged to use pacing strategies with frequent breaks to increase safety with PO intake. Patient agreeable to recommendations. Continue minced and moist diet with thin liquids.     Dysphagia Outcome and Severity Scale (DOSS)  Score: 5 Description   []  7 Normal in all situations   []  6 Within Functional Limits/ Modified independent   [x]  5 Mild Dysphagia: Distant  Supervision. May need one diet consistency      restricted.   []  4 Mild-Moderate Dysphagia: Intermittent supervision/cuing. One-two diet    consistencies restricted.   []  3 Moderate Dysphagia: Total assistance, supervision, or strategies.       Two or more diet consistencies restricted.   []  2 Moderate-Severe Dysphagia: Maximum assistance or maximum use     of strategies with partial po intake   []  1 Severe dysphagia- NPO. Unable to tolerate any po safely       PLAN    Duration/Frequency: Continue to follow patient 3x/week for duration of hospitalization and/or until goals met    Rehabilitation Potential For Stated Goals: Good    Interdisciplinary Collaboration: RN/ PCT    Medical Necessity    Skilled intervention continues to be required due to patient still consuming a modified diet and medical complications.    Education:   Patient and/or family/caregiver educated on Diet recommendations, SLP recommendations, and SLP plan  Education response: Verbalizes understanding    Safety:   Call light within reach  In chair  RN notified     Therapy Time:  Time In: 1410  Time Out: 1424  Minutes: 14    Kendrew Paci, M.S.,CCC-SLP  07/20/2023 2:29 PM

## 2023-07-20 NOTE — Progress Notes (Signed)
 Nutrition Assessment  Assessment Type: Reassess  Reason for visit:  Best Practice Alert: Malnutrition Screening Tool   Malnutrition Screening Tool Score: 2    Nutrition Intervention:   Food and/or Nutrient Delivery:   Meals and Snacks:  Diet: Continue current diet per SLP evaluation  Medical Food Supplements:   Medical food supplement therapy:  Modify Ensure Plus High Protein (high calorie high protein) 350 calories, 20 grams protein per 8 ounce serving  and Magic Cup (frozen oral supplement) 290 calories, 9 grams protein per 4 ounce serving. Specify strawberry or chocolate.       Malnutrition Assessment:  Academy/A.S.P.E.N Clinical Malnutrition Criteria  Malnutrition Status: At risk for malnutrition (Decreased intake d/t dyspnea, recent admission with use of ONS)    Nutrition Focused Physical Exam: Unremarkable  (2/6 RD encounter)    Nutrition Assessment:  Food/Nutrition Related History: Per prior RD encounter 2/6: Pt provides the following nutrition hx: Notes that for the past 10 days, his appetite has been limited. Eating a small breakfast of oatmeal or cereal in the morning. Drinking fluids throughout the day. Occasionally taking soups in the afternoon. Did not take ONS at home. Does endorse needing soft foods at baseline due to esophageal dysphagia. Notes his esophagus is "bent". Per chart review; patient has received several dilations at the [place].  Last known EGD: 06/28/19- Spastic esophagus; Presbyesophagus; Nodular erythema at gastric cardia  2/13: Intake average was 50-75% for 3 recorded meals for last 4 days of prior admission. Pt c/o being too "winded" to talk at length.       Weight History: Limited OV weights available in EMR: 10/24/22: 170# (urology), 02/28/23: 179# (Onc), 05/11/23:184# (pulm)  CBW: 181# (06/29/23 bed scale)  2/6: Overall slight tend up in weight over the past year. Pt reports this is because he has is now at a senior living facility and the meals are included. Notes that he now eats much more  at baseline than he used to and has gained weight.   Nutrition Background:       PMH: asthma, HTN, OSA, TIA, GERD, blind in L eye, CKD3, TIA, B-cell lymphoma, CAD, CHF.  Recent admission 2/5 to 2/10 with COVID PNA, new Afib  Presented back with cough and persistent dyspnea, not eating or drinking  Admitted with sepsis and PNA.  Nutrition Monitoring/Evaluation:  SLP eval: 2/7 -MM5, OK to upgrade to SB upon pt request, 2/12-regular, 2/13-MBS, MM5 with thin liquids, no straw, 2/19:  changed to mildly thick, no straw, 2/20: May have thin water via cup between meals, 2/25-changed to thin liquids  2/25: EGD- small hiatal hernia and Schatzki's ring which was dilated.    Pt seen sitting in chair. Lunch tray in room with 100% of starch and applesauce consumed, none of entree, vegetable or magic cup. No Ensure on tray. Pt reports appetite as pretty good or at baseline. He says he did not eat more because he was full. Offered to get an Ensure but pt declined. Pt reports he likes the magic cup and ensure equally well. He typically drinks 1 bottle of Ensure a day and says sometimes he eets the magic cup and sometimes he doesn't  Current Nutrition Therapies:  ADULT DIET; Dysphagia - Minced and Moist  ADULT ORAL NUTRITION SUPPLEMENT; Breakfast, Lunch, Dinner; Frozen Oral Supplement  ADULT ORAL NUTRITION SUPPLEMENT; Breakfast, Lunch, Dinner; Educational psychologist Protein Oral Supplement    Current Intake:   Average Meal Intake: 26-50% (1 recorded and 1 RD observed in past  3 days) Average Supplements Intake: 0% (RD observed)      Anthropometric Measures:  Height: 167.6 cm (5\' 6" )  Current Body Wt: 81.6 kg (179 lb 14.3 oz) (2/11), Weight source: Stated  BMI: 29, Overweight (BMI 25.0-29.9)  Admission Body Weight: 81.6 kg (179 lb 14.3 oz) (stated)  Ideal Body Weight (Kg) (Calculated): 65 kg (142 lbs),    BMI Category Overweight (BMI 25.0-29.9)    Comparative Standards:  Energy (kcal/day): 8295-6213 (18-22 kcal/kg) (Kcal/kg Weight  used: 81.6 kg Admission  Protein (g/day): 65-82 (0.8-1 g/kg) Weight Used: (Admission) 81.6 kg  Fluid (ml/day):   (1 ml/kcal)    Nutrition Diagnosis:   Inadequate oral intake related to early satiety, swallowing difficulty as evidenced by  (modified diet per SLP, intake as above)    Nutrition Goal(s):   Previous Goal Met: Progressing toward Goal(s)  Active Goal: Meet at least 75% of estimated needs, by next RD assessment  Type of Goal: Continue current goal    Discharge Planning:    Continue Oral Nutrition Supplement    Bambi Fehnel, RD

## 2023-07-20 NOTE — Progress Notes (Signed)
 ACUTE OCCUPATIONAL THERAPY GOALS:   (Developed with and agreed upon by patient and/or caregiver.)  1. Patient will perform grooming with SPV and adaptive equipment as needed. PROGRESSING 07/11/23   2. Patient will perform upper body dressing with SPV and adaptive equipment as needed.  3. Patient will perform lower body dressing with SPV and adaptive equipment as needed.  4. Patient will perform bathing with SPV and adaptive equipment as needed.  5. Patient will perform toileting and toilet transfer with SPV and adaptive equipment as needed.  6. Patient will perform ADL functional mobility and tranfers in room with SPV and adaptive equipment as needed.   PROGRESSING 07/13/23   7. Patient/family to demonstrate knowledge of home safety and DME recommendations.    Timeframe: 7 visits     OCCUPATIONAL THERAPY Daily Note and PM       OT Visit Days: 6  Acknowledge Orders  Time  OT Charge Capture  Rehab Caseload Tracker      Paul Medina is a 89 y.o. male   PRIMARY DIAGNOSIS: Acute respiratory failure with hypoxia (HCC)  Severe sepsis (HCC) [A41.9, R65.20]  Pneumonia of right lung due to infectious organism, unspecified part of lung [J18.9]  Sepsis, due to unspecified organism, unspecified whether acute organ dysfunction present (HCC) [A41.9]  Procedure(s) (LRB):  ESOPHAGOGASTRODUODENOSCOPY BIOPSY/balloon dilation (N/A)  2 Days Post-Op  Reason for Referral: Generalized Muscle Weakness (M62.81)  Other lack of cordination (R27.8)  Difficulty in walking, Not elsewhere classified (R26.2)  Dizziness and Giddiness (R42)  Inpatient: Payor: [employer] / Plan: [employer] / Product Type: *No Product type* /     ASSESSMENT:     REHAB RECOMMENDATIONS:   Recommendation to date pending progress:  Setting:  Short-term Rehab  Continued occupational therapy recommended at discharge    Equipment:    None     ASSESSMENT:  Paul Medina is admitted for the above diagnoses and presents with overall deficits in strength, functional  mobility and ADL performance.  He lives alone in a senior living apartment community with level entry. At baseline (prior to recent hospitalizations), he reports independence with ADLs and mobility (sometimes uses rollator). Today, he was able to perform bed mobility, household mobility, and grooming tasks seated in recliner with setup. Patient has been using urinal for toileting--donned new brief at EOB. He remains on 3L NC during session with episodes of wet/productive sounding cough. SpO2 97%. Presents with decreased activity tolerance impacting his ability to safely and independently perform ADLs. He was left in recliner chair with all needs met and in reach. Xray tech at bedside. Pt is functioning below their baseline and will benefit from skilled OT during hospital stay. Anticipate pt will benefit from continued therapy upon discharge.     07/06/2023: Patient upon arrival reclined in bed, on 3L of O2 reports shortness of breath, spO2 95%. Patient completed supine to sit with stand by assist. Patient stood with rolling walker with stand by assist and performed functional mobility with rolling walker stand by assist to sit in bedside chair. Patient needed extended time to complete all tasks today. RT notified. Lengthy discussion with patient about discharge planning, patient is confused, he did not want to have this conversation at this time. Patient will continue to benefit from skilled OT services to maximize independence and safety with ADL tasks.     07/10/23 900am Patient supine in bed on 3 LPM nc. MD present in room to encourage oob activity. Patient agreeable O2 sat 91-93%. He  rolled right and left with contact guard assist and was assisted with rear peri care and brief change. He sat on the eob with min assist and sat was 85% but rebounded quickly with breathing through his nose to low 90's. He drank with mod cues for small sips and chin tuck. He stood and took steps to recliner with min assist with  [employer]. He was set up in recliner and was educated on OT poc and recommendations.  He was agreeable to SHORT TERM REHAB as he is not at his baseline and lives alone. Tab alert in place. HR  varied during the session and nursing advised. Will follow.    07/11/23 830am patient sitting up on eob completing teeth brushing  with nursing student. He  stood and took steps to recliner with contact guard assist with [employer]. He was set up in recliner and eating with set up. Resp therapy started breathing treatment. He appears stronger today and o2 sat staying 92%+. HR around 100 consistently He would continue to benefit from skilled OT services and SHORT TERM REHAB. Will follow..      07/13/23 1120 patient asleep in recliner. He awoke with cues. He is on room air and o2 sat 92%. He performed breathing exercises with his flutter valve and o2 increased to 95%. He ambulated in the room with contact guard assist with [facility]. He returned to his chair. He reported he did not sleep all night. CNA entered and reported she has been walking him to the restroom.  He returned to his recliner and was set up with his needs and tab alert in place. He is making progress with mobility. Recommend SHORT TERM REHAB.     07/20/23 1345 attempted to see patient this am. He declined be said to check back in the afternoon. Upon arrival he was up in the recliner and reported he ate lunch there. He agreed to work with therapy and walk and go brush his teeth. He stood with min assist and frequent rest breaks in chair. He sat in front of the sink to groom with rest breaks. He rested and returned to recliner with min assist. His o2 sat were in the 90's with NASAL CANULA in place. He plans to discharge home with his daughter. All needs in reach and tab alert in place. Will follow.     [employer] AM-PACT "6 Clicks" Daily Activity Inpatient Short Form:    AM-PAC Daily Activity - Inpatient   How much help is needed for putting on  and taking off regular lower body clothing?: A Lot  How much help is needed for bathing (which includes washing, rinsing, drying)?: A Lot  How much help is needed for toileting (which includes using toilet, bedpan, or urinal)?: A Little  How much help is needed for putting on and taking off regular upper body clothing?: None  How much help is needed for taking care of personal grooming?: None  How much help for eating meals?: None  AM-PAC Inpatient Daily Activity Raw Score: 19  AM-PAC Inpatient ADL T-Scale Score : 40.22  ADL Inpatient CMS 0-100% Score: 42.8  ADL Inpatient CMS G-Code Modifier : CK           SUBJECTIVE:     Mr. Dismuke stated he wanted to brush his teeth    Social/Functional Lives With: Alone  Type of Home: Senior housing apartment  Home Layout: One level  Home Access: Level entry, Occupational psychologist Shower/Tub: Walk-in shower  Bathroom Toilet: Midwife: Grab bars in shower, Grab bars around toilet  Bathroom Accessibility: Accessible  Home Equipment: Rollator  Receives Help From: Family  Prior Level of Assist for ADLs: Independent  Prior Level of Assist for [employer]: Independent  Prior Level of Assist for Transfers: Independent  Occupation: Retired    OBJECTIVE:     LINES / DRAINS / AIRWAY: None    RESTRICTIONS/PRECAUTIONS:  Restrictions/Precautions: Isolation, Fall Risk, Bed Alarm  contact  PAIN: VITALS / O2:   Pre Treatment:       none    Post Treatment: none       Vitals      80s during session    Oxygen    O2 3 LPM     GROSS EVALUATION: INTACT IMPAIRED   (See Comments)   UE AROM [x]  []    UE PROM [x]  []    Strength []   Generalized weakness     Posture / Balance []   Poor+/fair- using RW for functional mobility and transfers   Sensation [x]      Coordination [x]        Tone [x]        Edema []     Activity Tolerance []   Very weak     Hand Dominance R []  L []       COGNITION/  PERCEPTION: INTACT IMPAIRED   (See Comments)   Orientation [x]      Vision [x]      Hearing []   Hearing aides  B    Cognition  [x]   Follows simple directions   Perception []   impaired     MOBILITY: I Mod I S SBA CGA Min Mod Max Total  NT x2 Comments:   Bed Mobility    Rolling []  []  []  []  []  []  []  []  []  [x]  []     Supine to Sit []  []  []  []  []  []  []  []  []  [x]  []     Scooting []  []  []  []  []  []  []  []  []  [x]  []     Sit to Supine []  []  []  []  []  []  []  []  []  [x]  []     Transfers    Sit to Stand []  []  []  []  []  [x]  []  []  []  []  []     Bed to Chair []  []  []  []  []  []  []  []  []  []  []  RW   Stand to Sit []  []  []  []  []  [x]  []  []  []  []  []     Tub/Shower []  []  []  []  []  []  []  []  []  []  []      Toilet []  []  []  []  []  [x]  []  []  []  []  []       []  []  []  []  []  []  []  []  []  []  []     I=Independent, Mod I=Modified Independent, S=Supervision/Setup, SBA=Standby Assistance, CGA=Contact Guard Assistance, Min=Minimal Assistance, Mod=Moderate Assistance, Max=Maximal Assistance, Total=Total Assistance, NT=Not Tested    ACTIVITIES OF DAILY LIVING: I Mod I S SBA CGA Min Mod Max Total NT Comments   BASIC ADLs:              Upper Body Bathing  []  []  []  []  []  []  []  []  []  [x]      Lower Body Bathing []  []  []  []  []  []  []  []  []  [x]      Toileting []  []  []  []  []  []  []  []  []  [x]     Upper Body Dressing []  []  []  []  []  []  []  []  []  [x]     Lower Body Dressing []  []  []  []  []  []  []  []  []  [x]   Feeding []  []  []  [x]  []  []  []  []  []  []     Grooming []  []  []  [x]  []  []  []  []  []  []     Personal Device Care []  []  []  []  []  []  []  []  []  []     Functional Mobility []  []  []  []  []  [x]  []  []  []  []   With ROLLING WALKER    I=Independent, Mod I=Modified Independent, S=Supervision/Setup, SBA=Standby Assistance, CGA=Contact Guard Assistance, Min=Minimal Assistance, Mod=Moderate Assistance, Max=Maximal Assistance, Total=Total Assistance, NT=Not Tested    PLAN:   FREQUENCY/DURATION     for duration of hospital stay or until stated goals are met, whichever comes first.    PROBLEM LIST:   (Skilled intervention is medically necessary to address:)  Decreased ADL/Functional Activities  Decreased Activity  Tolerance  Decreased Balance  Decreased Gait Ability  Decreased Safety Awareness  Decreased Strength  Decreased Transfer Abilities  Increased Pain   INTERVENTIONS PLANNED:  (Benefits and precautions of occupational therapy have been discussed with the patient.)  Self Care Training  Therapeutic Activity  Therapeutic Exercise/HEP  Neuromuscular Re-education  Manual Therapy  Education         TREATMENT:     TREATMENT:   SELF CARE: (25 minutes):   Procedure(s) (per grid) utilized to improve and/or restore self-care/home management as related to grooming and functional mobility  . Required mod visual, verbal, manual, and tactile cueing to facilitate activities of daily living skills, compensatory activities, and OT poc, discharge plan walker use and safety with mobility .      TREATMENT GRID:  N/A    AFTER TREATMENT PRECAUTIONS: Alarm Activated, Bed/Chair Locked, Call light within reach, Chair, Needs within reach, and RN notified    INTERDISCIPLINARY COLLABORATION:  RN/ PCT and OT/ COTA    EDUCATION:  Education Given To: Patient  Education Provided: Role of Therapy;ADL Adaptive Strategies;Energy Conservation;Transfer Training;IADL Safety;Mobility Training;Fall Prevention Strategies;Equipment  Education Method: Demonstration;Verbal  Barriers to Learning: Hearing  Education Outcome: Verbalized understanding;Demonstrated understanding;Continued education needed    TOTAL TREATMENT DURATION AND TIME:  Time In: 1345  Time Out: 1410  Minutes: [address], OT

## 2023-07-20 NOTE — Progress Notes (Signed)
 Hospitalist Progress Note   Admit Date:  07/04/2023 11:46 AM   Name:  Paul Medina   Age:  89 y.o.  Sex:  male  DOB:  11-09-1930   MRN:  [id number]   Room:  357/01    Presenting/Chief Complaint: Shortness of Breath     Reason(s) for Admission: Severe sepsis (HCC) [A41.9, R65.20]  Pneumonia of right lung due to infectious organism, unspecified part of lung [J18.9]  Sepsis, due to unspecified organism, unspecified whether acute organ dysfunction present [facility]) [A41.9]     Hospital Day #16      Hospital Course:   Paul Medina is a 89 y.o. male with chronic combined heart failure, PAF on Eliquis, HTN, CAD, history of TIA, left eye blindness, OSA on CPAP, and CKD stage III admitted on 2/11 due to severe sepsis and acute respiratory failure from bilateral pneumonia consistent with aspiration and hospital-acquired pneumonia.  He also had recently been diagnosed with COVID.  2/11 CT chest with worsening right sided infiltrates and improving left sided infiltrates.    Subjective & 24hr Events:   Patient awake and alert reclining comfortably in bed at time of visit.  Reports he is feeling well with no new complaints today.  Still having cough productive of thick sputum.  Due to age-related memory impairment he does not immediately recall my extensive discussion with his daughter and himself yesterday stressing the need for utilization of flutter valve and getting out of bed.  States he is only used a flutter valve once today, approximately 1.5 hours prior to visit.    Assessment & Plan:     Bilateral aspiration pneumonia/severe sepsis  Afebrile.  WBC normalized.  Completed Augmentin and azithromycin on 2/19.  I discussed the case with Dr. Suzie Portela of pulmonology today.  We both are in agreement that the patient is near the maximal clinically improved status given his medical comorbidities.  We both continue to stress to the patient family need for flutter valve and getting out of bed and mobilizing frequently to aid  in mobilization of his thick pulmonary secretions which is the largest part of his ongoing respiratory challenge.    Dysphagia  Status-post EGD on 2/25, showed small hiatal hernia and Schatzki's ring which was dilated.  Speech therapy reevaluated yesterday and recommended minced and moist diet with thin liquids.    COVID-19 virus infection  Completed 10-day course of dexamethasone on 2/25.    Acute hypoxic respiratory failure  Supplemental O2 requirement little changed in recent days.  Continue to wean supplemental O2 off as tolerated.  Will obtain 6-minute walk test prior to eventual discharge.    Chronic kidney disease stage III  Renal function baseline.    Essential hypertension  Continue carvedilol.    Chronic combined systolic and diastolic congestive heart failure  Continue Lasix daily.    Paroxysmal atrial fibrillation  Remains on beta-blocker and Eliquis.      Anticipated Discharge Arrangements:   Skilled Nursing Facility, however family wishes to take him home with home health services, to which the patient is in agreement.  Will monitor clinical status into tomorrow, and if stable versus today we will plan to discharge at that time.  I did leave daughter a voicemail today in that regard.    PT/OT evals ordered?  Therapy evals ordered  Diet:  ADULT DIET; Dysphagia - Minced and Moist  ADULT ORAL NUTRITION SUPPLEMENT; Breakfast, Lunch, Dinner; Educational psychologist Protein Oral Supplement  ADULT ORAL NUTRITION SUPPLEMENT; Lunch; Frozen Oral  Supplement  VTE prophylaxis: Already on anticoagulation  Code status: DNR      Non-peripheral Lines and Tubes (if present):          Telemetry (if present):  Cardiac/Telemetry Monitor On: No        Hospital Problems:  Principal Problem:    Acute respiratory failure with hypoxia (HCC)  Active Problems:    Chronic combined systolic and diastolic heart failure (HCC)    Primary hypertension    HLD (hyperlipidemia)    Coronary atherosclerosis of native coronary artery     History of TIA (transient ischemic attack)    OSA (obstructive sleep apnea)    Stage 3 chronic kidney disease (HCC)    COVID-19 virus infection    Severe sepsis (HCC)    Paroxysmal atrial fibrillation (HCC)    Aspiration pneumonia of both lungs (HCC)    History of B-cell lymphoma    DNR (do not resuscitate)    Oropharyngeal dysphagia    Pneumonia of right lung due to infectious organism    Ineffective airway clearance  Resolved Problems:    * No resolved hospital problems. *      Objective:   Patient Vitals for the past 24 hrs:   Temp Pulse Resp BP SpO2   07/20/23 0806 -- 74 18 -- 92 %   07/20/23 0741 98.1 F (36.7 C) 80 19 107/62 91 %   07/19/23 2101 -- 76 20 -- 95 %   07/19/23 1722 -- -- -- -- 97 %       Oxygen Therapy  SpO2: 92 %  Pulse via Oximetry: 69 beats per minute  Pulse Oximeter Device Mode: Intermittent  Pulse Oximeter Device Location: Right, Finger  O2 Device: Nasal cannula  Skin Assessment: Clean, dry, & intact  FiO2 : 32 %  O2 Flow Rate (L/min): 3 L/min    Estimated body mass index is 29.05 kg/m as calculated from the following:    Height as of this encounter: 1.676 m (5\' 6" ).    Weight as of this encounter: 81.6 kg (180 lb).  No intake or output data in the 24 hours ending 07/20/23 1429        Physical Exam:   General:    Well nourished elderly male reclining in bed, NAD  Head:  Normocephalic, atraumatic  Eyes:  Sclerae appear normal.  Pupils equally round.  ENT:  Nares appear normal.  Moist oral mucosa.  Neck:  No restricted ROM.  Trachea midline.   CV:   RRR.  No m/r/g.  No jugular venous distension.  No LE edema.  Lungs:   No pharyngeal secretions audible today.  Scattered rhonchi throughout the chest.  Symmetric expansion.  3 L O2 BNC, SpO2 92%.  Abdomen:   Soft, nontender, nondistended.  Skin:     No rashes.  Normal coloration.   Warm and dry.    Neuro:  AAOx3.  CN II-XII grossly intact.    Psych:  Normal mood and affect.      I have personally reviewed labs and tests:  Recent Labs:  Recent  Results (from the past 48 hour(s))   Basic Metabolic Panel    Collection Time: 07/19/23  6:27 AM   Result Value Ref Range    Sodium 132 (L) 136 - 145 mmol/L    Potassium 4.6 3.5 - 5.1 mmol/L    Chloride 99 98 - 107 mmol/L    CO2 22 20 - 29 mmol/L    Anion Gap 11 7 -  16 mmol/L    Glucose 101 (H) 70 - 99 mg/dL    BUN 37 (H) 8 - 23 MG/DL    Creatinine 2.84 (H) 0.80 - 1.30 MG/DL    Est, Glom Filt Rate 51 (L) >60 ml/min/1.19m2    Calcium 8.4 (L) 8.8 - 10.2 MG/DL   CBC with Auto Differential    Collection Time: 07/19/23  6:27 AM   Result Value Ref Range    WBC 4.8 4.3 - 11.1 K/uL    RBC 2.48 (L) 4.23 - 5.6 M/uL    Hemoglobin 7.1 (L) 13.6 - 17.2 g/dL    Hematocrit 13.2 (L) 41.1 - 50.3 %    MCV 86.3 82.0 - 102.0 FL    MCH 28.6 26.1 - 32.9 PG    MCHC 33.2 31.4 - 35.0 g/dL    RDW 44.0 (H) 10.2 - 14.6 %    Platelets 120 (L) 150 - 450 K/uL    MPV 12.1 9.4 - 12.3 FL    nRBC 0.00 0.0 - 0.2 K/uL    Differential Type AUTOMATED      Neutrophils % 70.9 43.0 - 78.0 %    Lymphocytes % 9.3 (L) 13.0 - 44.0 %    Monocytes % 14.7 (H) 4.0 - 12.0 %    Eosinophils % 0.2 (L) 0.5 - 7.8 %    Basophils % 0.6 0.0 - 2.0 %    Immature Granulocytes % 4.3 0.0 - 5.0 %    Neutrophils Absolute 3.42 1.70 - 8.20 K/UL    Lymphocytes Absolute 0.45 (L) 0.50 - 4.60 K/UL    Monocytes Absolute 0.71 0.10 - 1.30 K/UL    Eosinophils Absolute 0.01 0.00 - 0.80 K/UL    Basophils Absolute 0.03 0.00 - 0.20 K/UL    Immature Granulocytes Absolute 0.21 0.0 - 0.5 K/UL   CBC with Auto Differential    Collection Time: 07/20/23  6:25 AM   Result Value Ref Range    WBC 6.2 4.3 - 11.1 K/uL    RBC 2.74 (L) 4.23 - 5.6 M/uL    Hemoglobin 8.1 (L) 13.6 - 17.2 g/dL    Hematocrit 72.5 (L) 41.1 - 50.3 %    MCV 85.4 82.0 - 102.0 FL    MCH 29.6 26.1 - 32.9 PG    MCHC 34.6 31.4 - 35.0 g/dL    RDW 36.6 (H) 44.0 - 14.6 %    Platelets 122 (L) 150 - 450 K/uL    MPV 12.1 9.4 - 12.3 FL    nRBC 0.00 0.0 - 0.2 K/uL    Differential Type AUTOMATED      Neutrophils % 69.6 43.0 - 78.0 %    Lymphocytes  % 10.2 (L) 13.0 - 44.0 %    Monocytes % 15.2 (H) 4.0 - 12.0 %    Eosinophils % 0.2 (L) 0.5 - 7.8 %    Basophils % 0.6 0.0 - 2.0 %    Immature Granulocytes % 4.2 0.0 - 5.0 %    Neutrophils Absolute 4.29 1.70 - 8.20 K/UL    Lymphocytes Absolute 0.63 0.50 - 4.60 K/UL    Monocytes Absolute 0.94 0.10 - 1.30 K/UL    Eosinophils Absolute 0.01 0.00 - 0.80 K/UL    Basophils Absolute 0.04 0.00 - 0.20 K/UL    Immature Granulocytes Absolute 0.26 0.0 - 0.5 K/UL       Current Meds:  Current Facility-Administered Medications   Medication Dose Route Frequency    sodium chloride (Inhalant) 3 % nebulizer solution 4  mL  4 mL Nebulization BID    guaiFENesin (MUCINEX) extended release tablet 1,200 mg  1,200 mg Oral BID    acetylcysteine (MUCOMYST) 20 % solution 600 mg  600 mg Inhalation BID    saliva substitute (BIOTENE/MOUTH KOTE) liquid   Oral TID PRN    magic (miracle) mouthwash  10 mL Swish & Spit 4x Daily PRN    nystatin (MYCOSTATIN) 100000 UNIT/ML suspension 500,000 Units  5 mL Oral 4x Daily    apixaban (ELIQUIS) tablet 5 mg  5 mg Oral 2 times per day    lactobacillus (CULTURELLE) capsule 1 capsule  1 capsule Oral Daily with breakfast    prednisoLONE acetate (PRED FORTE) 1 % ophthalmic suspension 1 drop  1 drop Left Eye Daily    medicated lip ointment (BLISTEX)   Topical PRN    albuterol (PROVENTIL) (2.5 MG/3ML) 0.083% nebulizer solution 2.5 mg  2.5 mg Nebulization Q4H PRN    ipratropium 0.5 mg-albuterol 2.5 mg (DUONEB) nebulizer solution 1 Dose  1 Dose Inhalation BID RT    rosuvastatin (CRESTOR) tablet 10 mg  10 mg Oral Nightly    sodium chloride (Inhalant) 3 % nebulizer solution 4 mL  4 mL Nebulization PRN    arformoterol tartrate (BROVANA) nebulizer solution 15 mcg  15 mcg Nebulization BID RT    atropine 1 % ophthalmic solution 1 drop  1 drop Left Eye Nightly    brimonidine (ALPHAGAN) 0.2 % ophthalmic solution 1 drop  1 drop Left Eye TID    sodium chloride flush 0.9 % injection 5-40 mL  5-40 mL IntraVENous 2 times per day     sodium chloride flush 0.9 % injection 5-40 mL  5-40 mL IntraVENous PRN    0.9 % sodium chloride infusion   IntraVENous PRN    potassium chloride (KLOR-CON M) extended release tablet 40 mEq  40 mEq Oral PRN    Or    potassium bicarb-citric acid (EFFER-K) effervescent tablet 40 mEq  40 mEq Oral PRN    Or    potassium chloride 10 mEq/100 mL IVPB (Peripheral Line)  10 mEq IntraVENous PRN    magnesium sulfate 2000 mg in 50 mL IVPB premix  2,000 mg IntraVENous PRN    ondansetron (ZOFRAN-ODT) disintegrating tablet 4 mg  4 mg Oral Q8H PRN    Or    ondansetron (ZOFRAN) injection 4 mg  4 mg IntraVENous Q6H PRN    polyethylene glycol (GLYCOLAX) packet 17 g  17 g Oral Daily PRN    acetaminophen (TYLENOL) tablet 650 mg  650 mg Oral Q6H PRN    Or    acetaminophen (TYLENOL) suppository 650 mg  650 mg Rectal Q6H PRN    carvedilol (COREG) tablet 6.25 mg  6.25 mg Oral BID WC    doxazosin (CARDURA) tablet 4 mg  4 mg Oral Daily    pantoprazole (PROTONIX) tablet 40 mg  40 mg Oral QAM AC    furosemide (LASIX) tablet 20 mg  20 mg Oral Daily       Signed:  Norval Morton, M.D.  Hospitalist  07/20/23   2:29 PM

## 2023-07-20 NOTE — Progress Notes (Signed)
 Paul Medina  Admission Date: 07/04/2023         Daily Progress Note: 07/20/2023    The patient's chart is reviewed and the patient is discussed with the staff.    Background: 89 y.o. male with chronic combined heart failure, PAF on Eliquis, HTN, CAD, history of TIA, left eye blindness, OSA on CPAP, and CKD stage III admitted on 2/11 due to severe sepsis and acute respiratory failure from bilateral pneumonia consistent with aspiration and hospital-acquired pneumonia. He also had recently been diagnosed with COVID and treated   Subjective:     No leukocytosis today.  Sputum culture pending.  CPT vest not functional so wasn't tried.    Remains on oxygen.  Doesn't seem to have a great understanding of the flutter valve.      Current Facility-Administered Medications   Medication Dose Route Frequency    acetylcysteine (MUCOMYST) 20 % solution 600 mg  600 mg Inhalation BID    saliva substitute (BIOTENE/MOUTH KOTE) liquid   Oral TID PRN    magic (miracle) mouthwash  10 mL Swish & Spit 4x Daily PRN    nystatin (MYCOSTATIN) 100000 UNIT/ML suspension 500,000 Units  5 mL Oral 4x Daily    apixaban (ELIQUIS) tablet 5 mg  5 mg Oral 2 times per day    lactobacillus (CULTURELLE) capsule 1 capsule  1 capsule Oral Daily with breakfast    prednisoLONE acetate (PRED FORTE) 1 % ophthalmic suspension 1 drop  1 drop Left Eye Daily    medicated lip ointment (BLISTEX)   Topical PRN    albuterol (PROVENTIL) (2.5 MG/3ML) 0.083% nebulizer solution 2.5 mg  2.5 mg Nebulization Q4H PRN    ipratropium 0.5 mg-albuterol 2.5 mg (DUONEB) nebulizer solution 1 Dose  1 Dose Inhalation BID RT    rosuvastatin (CRESTOR) tablet 10 mg  10 mg Oral Nightly    sodium chloride (Inhalant) 3 % nebulizer solution 4 mL  4 mL Nebulization PRN    arformoterol tartrate (BROVANA) nebulizer solution 15 mcg  15 mcg Nebulization BID RT    atropine 1 % ophthalmic solution 1 drop  1 drop Left Eye Nightly    brimonidine (ALPHAGAN) 0.2 % ophthalmic solution 1  drop  1 drop Left Eye TID    sodium chloride flush 0.9 % injection 5-40 mL  5-40 mL IntraVENous 2 times per day    sodium chloride flush 0.9 % injection 5-40 mL  5-40 mL IntraVENous PRN    0.9 % sodium chloride infusion   IntraVENous PRN    potassium chloride (KLOR-CON M) extended release tablet 40 mEq  40 mEq Oral PRN    Or    potassium bicarb-citric acid (EFFER-K) effervescent tablet 40 mEq  40 mEq Oral PRN    Or    potassium chloride 10 mEq/100 mL IVPB (Peripheral Line)  10 mEq IntraVENous PRN    magnesium sulfate 2000 mg in 50 mL IVPB premix  2,000 mg IntraVENous PRN    ondansetron (ZOFRAN-ODT) disintegrating tablet 4 mg  4 mg Oral Q8H PRN    Or    ondansetron (ZOFRAN) injection 4 mg  4 mg IntraVENous Q6H PRN    polyethylene glycol (GLYCOLAX) packet 17 g  17 g Oral Daily PRN    acetaminophen (TYLENOL) tablet 650 mg  650 mg Oral Q6H PRN    Or    acetaminophen (TYLENOL) suppository 650 mg  650 mg Rectal Q6H PRN    guaiFENesin (MUCINEX) extended release tablet 600 mg  600 mg Oral  BID    carvedilol (COREG) tablet 6.25 mg  6.25 mg Oral BID WC    doxazosin (CARDURA) tablet 4 mg  4 mg Oral Daily    pantoprazole (PROTONIX) tablet 40 mg  40 mg Oral QAM AC    furosemide (LASIX) tablet 20 mg  20 mg Oral Daily     Review of Systems: Unable to obtain due to patient factors.   Objective:   Blood pressure 107/62, pulse 80, temperature 98.1 F (36.7 C), temperature source Temporal, resp. rate 19, height 1.676 m (5\' 6" ), weight 81.6 kg (180 lb), SpO2 91%.   Intake/Output Summary (Last 24 hours) at 07/20/2023 0745  Last data filed at 07/19/2023 1243  Gross per 24 hour   Intake --   Output 200 ml   Net -200 ml     Physical Exam:   Constitutional:  the patient is well developed and in no acute distress  EENMT:  Sclera clear, pupils equal, oral mucosa moist  Respiratory: symmetric chest rise. Rhonchi slightly improved  Cardiovascular:  RRR without M,G,R. There is no edema  Gastrointestinal: soft and non-tender; with positive bowel  sounds.  Musculoskeletal: warm without cyanosis. Normal muscle tone.   Skin:  no jaundice or rashes, no wounds  Neurologic: symmetric strength, fluent speech  Psychiatric:  calm, appropriate, oriented x 4    Imaging: I performed an independent interpretation of the patient's images.  CXR:          CT from now vs 2 weeks vs 3 weeks. Clearing left base and some on right but diffuse infitrates/tree and bud in both lung.           LAB:  Recent Labs     07/18/23  0616 07/19/23  0627 07/20/23  0625   WBC 6.4 4.8 6.2   HGB 8.1* 7.1* 8.1*   HCT 24.7* 21.4* 23.4*   PLT 154 120* 122*     Recent Labs     07/18/23  0616 07/19/23  0627   NA 137 132*   K 4.7 4.6   CL 104 99   CO2 23 22   BUN 38* 37*   CREATININE 1.51* 1.31*   MG 2.2  --    PHOS 3.8  --      No results for input(s): "TROPHS", "NTPROBNP", "CRP", "ESR" in the last 72 hours.  Recent Labs     07/18/23  0616 07/19/23  0627   GLUCOSE 111* 101*      Microbiology:   @MICRORESULTS @  No results for input(s): "CULTURE" in the last 72 hours.  No results for input(s): "COVID19" in the last 72 hours.  ECHO: 06/28/23    ECHO (TTE) COMPLETE (PRN CONTRAST/BUBBLE/STRAIN/3D) 06/29/2023  3:43 PM (Final)    Interpretation Summary    Left Ventricle: Moderately reduced left ventricular systolic function with a visually estimated EF of 30 - 35%. Left ventricle is moderately dilated. Normal wall thickness. Grade II diastolic dysfunction with increased LAP.    Aortic Valve: Mild regurgitation. Mild stenosis of the aortic valve (DI ~0.44).    Mitral Valve: Mild to moderate regurgitation with a posterior directed jet.    Left Atrium: Left atrium is mildly dilated.    Image quality is adequate. Contrast used: Lumason.    Signed by: Lenna Gilford, MD on 06/29/2023  3:43 PM    Assessment and Plan:  (Medical Decision Making)     Mucus plugging/ineffective airway clearance  Needs to mobilize more.  Continue coaching him with  the flutter valve.  10 breaths out through it at least twice a day.   Completes mucomyst today.    Plan to continue flutter valve and 3% saline nebs at home.      Aspiration pneumonia  Follow up cultures    Debility  Significant.  Would confirm he has all the services needed for safe discharge home.    Hypoxia  Will need ambulating oximetry prior to discharge.        More than 50% of the time documented was spent in face-to-face contact with the patient and in the care of the patient on the floor/unit where the patient is located.    Harriett Rush, MD

## 2023-07-20 NOTE — Plan of Care (Signed)
 Problem: Discharge Planning  Goal: Discharge to home or other facility with appropriate resources  Outcome: Progressing     Problem: Safety - Adult  Goal: Free from fall injury  Outcome: Progressing     Problem: ABCDS Injury Assessment  Goal: Absence of physical injury  Outcome: Progressing     Problem: Respiratory - Adult  Goal: Achieves optimal ventilation and oxygenation  Outcome: Progressing     Problem: Skin/Tissue Integrity  Goal: Skin integrity remains intact  Description: 1.  Monitor for areas of redness and/or skin breakdown  2.  Assess vascular access sites hourly  3.  Every 4-6 hours minimum:  Change oxygen saturation probe site  4.  Every 4-6 hours:  If on nasal continuous positive airway pressure, respiratory therapy assess nares and determine need for appliance change or resting period  Outcome: Progressing

## 2023-07-20 NOTE — Progress Notes (Signed)
 ACUTE PHYSICAL THERAPY GOALS:   (Developed with and agreed upon by patient and/or caregiver.)      LTG:  (1.)Paul Medina will move from supine to sit and sit to supine  in bed with INDEPENDENT within 7 treatment day(s).    (2.)Paul Medina will transfer from bed to chair and chair to bed with SUPERVISION using the least restrictive device within 7 treatment day(s).    (3.)Paul Medina will ambulate with SUPERVISION for 200 feet with the least restrictive device within 7 treatment day(s).  ________________________________________________________________________________________________      PHYSICAL THERAPY Daily Note and PM  (Link to Caseload Tracking: PT Visit Days : 3  Acknowledge Orders  Time In/Out  PT Charge Capture  Rehab Caseload Tracker    Paul Medina is a 89 y.o. male   PRIMARY DIAGNOSIS: Acute respiratory failure with hypoxia (HCC)  Severe sepsis (HCC) [A41.9, R65.20]  Pneumonia of right lung due to infectious organism, unspecified part of lung [J18.9]  Sepsis, due to unspecified organism, unspecified whether acute organ dysfunction present (HCC) [A41.9]  Procedure(s) (LRB):  ESOPHAGOGASTRODUODENOSCOPY BIOPSY/balloon dilation (N/A)  2 Days Post-Op  Reason for Referral: Generalized Muscle Weakness (M62.81)  Difficulty in walking, Not elsewhere classified (R26.2)  Inpatient: Payor: [employer] / Plan: [facility] [employer] / Product Type: *No Product type* /     ASSESSMENT:     REHAB RECOMMENDATIONS:   Recommendation to date pending progress:  Setting:  Short-term Rehab  Pt said he is going home with daughter    Equipment:    None     ASSESSMENT:  Paul Medina admitted with above diagnoses.  He presented to the ER with complaints of cough and shortness of breath.  CT showed worsening R-sided pneumonia and worsening mucus plugging.  Patient was hospitalized at [facility] 2/5-2/10 with a-fib and pneumonia.  Short term rehab was recommended during last admission but patient declined and d/c'd home with HH  orders.  Patient reports he was told he would have to be in a facility for 2 weeks for rehab and he did not want that.  Explained to patient that there was no way for [place] to know how long he would need to stay for rehab but he reports he still plans to return home with [facility].  Patient currently demonstrates decreased strength, balance, mobility, and tolerance for activity.  He would benefit from therapy to address these deficits.  He is on 3L O2 at this time but did not qualify for O2 2 days ago when he was d/c'd from [facility].  Patient educated on need for mobility/time out of bed to improve strength, mobility, and ability to safely return home.       2/27 sitting in the chair upon arrival.  Sit>stand @ walker and stood few min to make sure no dizziness.  Ambulated 10 ft using RW with Min A and had to sit down.  Pt need rest break with stats in the 90%.  Sit>stand again @ the walker with Min A and ambulated 12 ft into the restroom up toward the sink.  Sat on BSC to brush teeth and wash face, but need rest break.  Sit>stand @ walker with Min A and ambulated 10 ft to the chair.  Therapist rolled pt back to the other side of bed.  While in the chair performs B LE exercises see below with rest breaks.  Remain in the chair with alarm on and instructed to call for assist, before getting up.  Pt is weaker today,  decrease in mobility and needs several breaks throughout session.  Plan is to go home with daughter.       [employer] AM-PACT "6 Clicks" Basic Mobility Inpatient Short Form  AM-PAC Basic Mobility - Inpatient   How much help is needed turning from your back to your side while in a flat bed without using bedrails?: A Little  How much help is needed moving from lying on your back to sitting on the side of a flat bed without using bedrails?: A Little  How much help is needed moving to and from a bed to a chair?: A Little  How much help is needed standing up from a chair using your arms?: A Little  How much help is  needed walking in hospital room?: A Little  How much help is needed climbing 3-5 steps with a railing?: A Little  AM-PAC Inpatient Mobility Raw Score : 18  AM-PAC Inpatient T-Scale Score : 43.63  Mobility Inpatient CMS 0-100% Score: 46.58  Mobility Inpatient CMS G-Code Modifier : CK    SUBJECTIVE:   Paul Medina agreeable    Social/Functional Lives With: Alone  Type of Home: Senior housing apartment  Home Layout: One level  Home Access: Level entry, Occupational psychologist Shower/Tub: Pension scheme manager: Midwife: Grab bars in shower, Grab bars around toilet  Bathroom Accessibility: Accessible  Home Equipment: Engineer, production Help From: Family  Prior Level of Assist for ADLs: Independent  Prior Level of Assist for [employer]: Independent  Prior Level of Assist for Transfers: Independent  Occupation: Retired    OBJECTIVE:     PAIN: VITALS / O2: PRECAUTION / LINES / DRAINS:   Pre Treatment:  no complaints         Post Treatment: no complaints Vitals NT       Oxygen   3 liters NC   IV and 02    RESTRICTIONS/PRECAUTIONS:  Restrictions/Precautions: Isolation, Fall Risk, Bed Alarm                 GROSS EVALUATION:  Intact Impaired (Comments):   AROM []   Generally decreased,  but functional    PROM []     Strength []   Generally decreased, but functional    Balance []      Posture []  Forward Head  Rounded Shoulders  Thoracic Kyphosis   Sensation [x]   grossly   Coordination []    Generally decreased, but functional   Tone []   Hypotonus throughout   Edema []     Activity Tolerance []   limited    []       COGNITION/  PERCEPTION: Intact Impaired (Comments):   Orientation [x]      Vision [x]      Hearing []   B hearing aids   Cognition  [x]        MOBILITY: I Mod I S SBA CGA Min Mod Max Total  NT x2 Comments:   Bed Mobility    Rolling []  []  []  []  []  []  []  []  []  []  []     Supine to Sit []  []  []  []  []  []  []  []  []  []  []     Scooting []  []  []  []  []  []  []  []  []  []  []     Sit to Supine []  []  []  []  []  []  []  []  []   []  []     Transfers    Sit to Stand []  []  []  []  []  [x]  []  []  []  []  []     Bed to Chair []  []  []  []  []   []  []  []  []  []   Stand to Sit []  []  []  []  []  [x] []  []  []  []  []  []     Toilet  []  []  []  []  []  [x]  []  []  []  []  []  Min A from low toilet   I=Independent, Mod I=Modified Independent, S=Supervision, SBA=Standby Assistance, CGA=Contact Guard Assistance,   Min=Minimal Assistance, Mod=Moderate Assistance, Max=Maximal Assistance, Total=Total Assistance, NT=Not Tested    GAIT: I Mod I S SBA CGA Min Mod Max Total  NT x2 Comments:   Level of Assistance []  []  []  []  []  [x]  []  []  []  []  []     Distance 12 (10, 10) feet    DME Rolling Walker    Gait Quality Decreased cadence , Decreased step clearance, Decreased step length, and Trunk flexion    Weightbearing Status N/A    Stairs  NT    I=Independent, Mod I=Modified Independent, S=Supervision, SBA=Standby Assistance, CGA=Contact Guard Assistance,   Min=Minimal Assistance, Mod=Moderate Assistance, Max=Maximal Assistance, Total=Total Assistance, NT=Not Tested    PLAN:   FREQUENCY AND DURATION: Daily for duration of hospital stay or until stated goals are met, whichever comes first.    THERAPY PROGNOSIS: Good    PROBLEM LIST:   (Skilled intervention is medically necessary to address:)  Decreased ADL/Functional Activities  Decreased Activity Tolerance  Decreased AROM/PROM  Decreased Balance  Decreased Gait Ability  Decreased Strength  Decreased Transfer Abilities INTERVENTIONS PLANNED:   (Benefits and precautions of physical therapy have been discussed with the patient.)  Therapeutic Activity  Therapeutic Exercise/HEP  Gait Training  Education       TREATMENT:       TREATMENT:   Therapeutic Activity (25 Minutes): Therapeutic activity included Scooting, Ambulation on level ground, Sitting balance , and Standing balance to improve functional Activity tolerance, Balance, Coordination, Mobility, Strength, and ROM.    TREATMENT GRID:   Date:  2/27 Date:   Date:     Activity/Exercise Parameters  Parameters Parameters   LAQ 5 x 2     Marching in place 5x 2                                       N/A    AFTER TREATMENT PRECAUTIONS: Alarm Activated, Bed/Chair Locked, Call light within reach, Chair, Needs within reach, and RN notified    INTERDISCIPLINARY COLLABORATION:  RN/ PCT    EDUCATION:    Educated patient and/or family/caregiver on the following: Assistive device indications/utilization/safety, Fall prevention strategies, Financial risk analyst, Home Exercise Program, and Precautions    TIME IN/OUT:  Time In: 1345  Time Out: 1410  Minutes: 25    Mackinsey Pelland C Lakyla Biswas, PTA

## 2023-07-21 LAB — POCT GLUCOSE: POC Glucose: 126 mg/dL — ABNORMAL HIGH (ref 65–100)

## 2023-07-21 LAB — CULTURE, RESPIRATORY (WITH GRAM STAIN): Culture: NORMAL

## 2023-07-21 MED ORDER — GUAIFENESIN ER 600 MG PO TB12
600 | ORAL_TABLET | Freq: Two times a day (BID) | ORAL | 0 refills | Status: AC
Start: 2023-07-21 — End: 2023-08-04

## 2023-07-21 MED ORDER — GUAIFENESIN ER 600 MG PO TB12
600 | ORAL_TABLET | Freq: Two times a day (BID) | ORAL | 0 refills | Status: DC
Start: 2023-07-21 — End: 2023-07-21

## 2023-07-21 MED ORDER — SODIUM CHLORIDE 3 % IN NEBU
3 | Freq: Two times a day (BID) | RESPIRATORY_TRACT | 1 refills | 30.00000 days | Status: DC
Start: 2023-07-21 — End: 2024-03-21

## 2023-07-21 MED ORDER — SODIUM CHLORIDE 3 % IN NEBU
3 | Freq: Two times a day (BID) | RESPIRATORY_TRACT | 1 refills | Status: DC
Start: 2023-07-21 — End: 2023-07-21

## 2023-07-21 MED ORDER — FUROSEMIDE 20 MG PO TABS
20 | ORAL_TABLET | Freq: Every day | ORAL | 3 refills | Status: DC
Start: 2023-07-21 — End: 2024-02-08

## 2023-07-21 MED FILL — CULTURELLE PO CAPS: ORAL | Qty: 1

## 2023-07-21 MED FILL — SODIUM CHLORIDE 3 % IN NEBU: 3 % | RESPIRATORY_TRACT | Qty: 4

## 2023-07-21 MED FILL — NYSTATIN 100000 UNIT/ML MT SUSP: 100000 UNIT/ML | OROMUCOSAL | Qty: 5

## 2023-07-21 MED FILL — IPRATROPIUM-ALBUTEROL 0.5-2.5 (3) MG/3ML IN SOLN: 0.5-2.533 (3) MG/3ML | RESPIRATORY_TRACT | Qty: 3

## 2023-07-21 MED FILL — ACETYLCYSTEINE 20 % IN SOLN: 20 % | RESPIRATORY_TRACT | Qty: 4

## 2023-07-21 MED FILL — GUAIFENESIN ER 600 MG PO TB12: 600 MG | ORAL | Qty: 2

## 2023-07-21 MED FILL — FUROSEMIDE 20 MG PO TABS: 20 MG | ORAL | Qty: 1

## 2023-07-21 MED FILL — DOXAZOSIN MESYLATE 4 MG PO TABS: 4 MG | ORAL | Qty: 1

## 2023-07-21 MED FILL — ARFORMOTEROL TARTRATE 15 MCG/2ML IN NEBU: 152 MCG/2ML | RESPIRATORY_TRACT | Qty: 2

## 2023-07-21 MED FILL — CARVEDILOL 6.25 MG PO TABS: 6.25 MG | ORAL | Qty: 1

## 2023-07-21 MED FILL — ROSUVASTATIN CALCIUM 5 MG PO TABS: 5 MG | ORAL | Qty: 2

## 2023-07-21 MED FILL — ELIQUIS 5 MG PO TABS: 5 MG | ORAL | Qty: 1

## 2023-07-21 MED FILL — PANTOPRAZOLE SODIUM 40 MG PO TBEC: 40 MG | ORAL | Qty: 1

## 2023-07-21 MED FILL — PREDNISOLONE ACETATE 1 % OP SUSP: 1 % | OPHTHALMIC | Qty: 100

## 2023-07-21 NOTE — Progress Notes (Signed)
 ACUTE PHYSICAL THERAPY GOALS:   (Developed with and agreed upon by patient and/or caregiver.)      LTG:  (1.)Paul Medina will move from supine to sit and sit to supine  in bed with INDEPENDENT within 7 treatment day(s).    (2.)Paul Medina will transfer from bed to chair and chair to bed with SUPERVISION using the least restrictive device within 7 treatment day(s).    (3.)Paul Medina will ambulate with SUPERVISION for 200 feet with the least restrictive device within 7 treatment day(s).  ________________________________________________________________________________________________      PHYSICAL THERAPY Daily Note and AM  (Link to Caseload Tracking: PT Visit Days : 4  Acknowledge Orders  Time In/Out  PT Charge Capture  Rehab Caseload Tracker    Paul Medina is a 89 y.o. male   PRIMARY DIAGNOSIS: Acute respiratory failure with hypoxia (HCC)  Severe sepsis (HCC) [A41.9, R65.20]  Pneumonia of right lung due to infectious organism, unspecified part of lung [J18.9]  Sepsis, due to unspecified organism, unspecified whether acute organ dysfunction present (HCC) [A41.9]  Procedure(s) (LRB):  ESOPHAGOGASTRODUODENOSCOPY BIOPSY/balloon dilation (N/A)  3 Days Post-Op  Reason for Referral: Generalized Muscle Weakness (M62.81)  Difficulty in walking, Not elsewhere classified (R26.2)  Inpatient: Payor: [employer] / Plan: [facility] [employer] / Product Type: *No Product type* /     ASSESSMENT:     REHAB RECOMMENDATIONS:   Recommendation to date pending progress:  Setting:  Short-term Rehab  Pt said he is going home with daughter    Equipment:    None     ASSESSMENT:  Paul Medina admitted with above diagnoses.  He presented to the ER with complaints of cough and shortness of breath.  CT showed worsening R-sided pneumonia and worsening mucus plugging.  Patient was hospitalized at [facility] 2/5-2/10 with a-fib and pneumonia.  Short term rehab was recommended during last admission but patient declined and d/c'd home with HH  orders.  Patient reports he was told he would have to be in a facility for 2 weeks for rehab and he did not want that.  Explained to patient that there was no way for [place] to know how long he would need to stay for rehab but he reports he still plans to return home with [facility].  Patient currently demonstrates decreased strength, balance, mobility, and tolerance for activity.  He would benefit from therapy to address these deficits.  He is on 3L O2 at this time but did not qualify for O2 2 days ago when he was d/c'd from [facility].  Patient educated on need for mobility/time out of bed to improve strength, mobility, and ability to safely return home.       2/28 supine upon arrival.  Work on bed mobility as follows:supine>scooting>eob with Min-CGA and extra time.  Sat on eob and work on Electronics engineer.  Sit>stand @ walker with CGA-SBA.  Ambulated 30 ft using RW with CGA-Min A over to the chair with one rest break.  Sat in the chair for a few min.  Then work on scooting back in the chair with some help.  RT did a walking stat with pt and he needs no 02 during gait.  Remain in the chair with needs in reach, alarm on and instructed to call for assist, before getting up.     [employer] AM-PACT "6 Clicks" Basic Mobility Inpatient Short Form  AM-PAC Basic Mobility - Inpatient   How much help is needed turning from your back to your side while  in a flat bed without using bedrails?: A Little  How much help is needed moving from lying on your back to sitting on the side of a flat bed without using bedrails?: A Little  How much help is needed moving to and from a bed to a chair?: A Little  How much help is needed standing up from a chair using your arms?: A Little  How much help is needed walking in hospital room?: A Little  How much help is needed climbing 3-5 steps with a railing?: A Little  AM-PAC Inpatient Mobility Raw Score : 18  AM-PAC Inpatient T-Scale Score : 43.63  Mobility Inpatient CMS 0-100% Score:  46.58  Mobility Inpatient CMS G-Code Modifier : CK    SUBJECTIVE:   Paul Medina agreeable to get up in the chair    Social/Functional Lives With: Alone  Type of Home: Senior housing apartment  Home Layout: One level  Home Access: Level entry, Occupational psychologist Shower/Tub: Pension scheme manager: Midwife: Grab bars in shower, Grab bars around toilet  Bathroom Accessibility: Accessible  Home Equipment: Engineer, production Help From: Family  Prior Level of Assist for ADLs: Independent  Prior Level of Assist for [employer]: Independent  Prior Level of Assist for Transfers: Independent  Occupation: Retired    OBJECTIVE:     PAIN: VITALS / O2: PRECAUTION / LINES / DRAINS:   Pre Treatment:  no complaints         Post Treatment: no complaints Vitals NT       Oxygen  O2 Therapy: Room air   IV and 02    RESTRICTIONS/PRECAUTIONS:  Restrictions/Precautions: Isolation, Fall Risk, Bed Alarm                 GROSS EVALUATION:  Intact Impaired (Comments):   AROM []   Generally decreased,  but functional    PROM []     Strength []   Generally decreased, but functional    Balance []      Posture []  Forward Head  Rounded Shoulders  Thoracic Kyphosis   Sensation [x]   grossly   Coordination []    Generally decreased, but functional   Tone []   Hypotonus throughout   Edema []     Activity Tolerance []   limited    []       COGNITION/  PERCEPTION: Intact Impaired (Comments):   Orientation [x]      Vision [x]      Hearing []   B hearing aids   Cognition  [x]        MOBILITY: I Mod I S SBA CGA Min Mod Max Total  NT x2 Comments:   Bed Mobility    Rolling []  []  []  []  []  [x]  []  []  []  []  []     Supine to Sit []  []  []  []  []  [x]  []  []  []  []  []     Scooting []  []  []  []  []  [x]  []  []  []  []  []     Sit to Supine []  []  []  []  []  []  []  []  []  []  []     Transfers    Sit to Stand []  []  []  []  []  [x]  []  []  []  []  []     Bed to Chair []  []  []  []  []   []  []  []  []  []     Stand to Sit []  []  []  []  []  [x] []  []  []  []  []  []     Toilet  []  []  []  []  []  [x]  []   []  []  []  []  Min A from low toilet  I=Independent, Mod I=Modified Independent, S=Supervision, SBA=Standby Assistance, CGA=Contact Guard Assistance,   Min=Minimal Assistance, Mod=Moderate Assistance, Max=Maximal Assistance, Total=Total Assistance, NT=Not Tested    GAIT: I Mod I S SBA CGA Min Mod Max Total  NT x2 Comments:   Level of Assistance []  []  []  []  [x]  [x]  []  []  []  []  []     Distance 30 feet    DME Rolling Walker    Gait Quality Decreased cadence , Decreased step clearance, Decreased step length, and Trunk flexion    Weightbearing Status N/A    Stairs  NT    I=Independent, Mod I=Modified Independent, S=Supervision, SBA=Standby Assistance, CGA=Contact Guard Assistance,   Min=Minimal Assistance, Mod=Moderate Assistance, Max=Maximal Assistance, Total=Total Assistance, NT=Not Tested    PLAN:   FREQUENCY AND DURATION: Daily for duration of hospital stay or until stated goals are met, whichever comes first.    THERAPY PROGNOSIS: Good    PROBLEM LIST:   (Skilled intervention is medically necessary to address:)  Decreased ADL/Functional Activities  Decreased Activity Tolerance  Decreased AROM/PROM  Decreased Balance  Decreased Gait Ability  Decreased Strength  Decreased Transfer Abilities INTERVENTIONS PLANNED:   (Benefits and precautions of physical therapy have been discussed with the patient.)  Therapeutic Activity  Therapeutic Exercise/HEP  Gait Training  Education       TREATMENT:       TREATMENT:   Therapeutic Activity (30 Minutes): Therapeutic activity included Supine to Sit, Scooting, Ambulation on level ground, Sitting balance , and Standing balance to improve functional Activity tolerance, Balance, Coordination, Mobility, and Strength.    TREATMENT GRID:   Date:  2/27 Date:   Date:     Activity/Exercise Parameters Parameters Parameters   LAQ 5 x 2     Marching in place 5x 2                                       N/A    AFTER TREATMENT PRECAUTIONS: Alarm Activated, Bed/Chair Locked, Call light within reach,  Chair, Needs within reach, and RN notified    INTERDISCIPLINARY COLLABORATION:  RN/ PCT    EDUCATION:    Educated patient and/or family/caregiver on the following: Assistive device indications/utilization/safety, Fall prevention strategies, Financial risk analyst, Home Exercise Program, and Precautions    TIME IN/OUT:  Time In: 1100  Time Out: 1130  Minutes: 30    Sabrie Moritz C Jahshua Bonito, PTA

## 2023-07-21 NOTE — Discharge Summary (Signed)
 Hospitalist Discharge Summary   Admit Date:  07/04/2023 11:46 AM   DC Note date: 07/21/2023  Name:  Paul Medina   Age:  89 y.o.  Sex:  male  DOB:  04-20-1931   MRN:  [id number]   Room:  357/01  PCP:  Unknown, Provider, ANP    Presenting Complaint: Shortness of Breath     Initial Admission Diagnosis: Severe sepsis (HCC) [A41.9, R65.20]  Pneumonia of right lung due to infectious organism, unspecified part of lung [J18.9]  Sepsis, due to unspecified organism, unspecified whether acute organ dysfunction present (HCC) [A41.9]     Problem List for this Hospitalization (present on admission):    Principal Problem:    Acute respiratory failure with hypoxia (HCC)  Active Problems:    Chronic combined systolic and diastolic heart failure (HCC)    Primary hypertension    HLD (hyperlipidemia)    Coronary atherosclerosis of native coronary artery    History of TIA (transient ischemic attack)    OSA (obstructive sleep apnea)    Stage 3 chronic kidney disease (HCC)    COVID-19 virus infection    Severe sepsis (HCC)    Paroxysmal atrial fibrillation (HCC)    Aspiration pneumonia of both lungs (HCC)    History of B-cell lymphoma    DNR (do not resuscitate)    Oropharyngeal dysphagia    Pneumonia of right lung due to infectious organism    Ineffective airway clearance  Resolved Problems:    * No resolved hospital problems. *      Hospital Course:  Paul Medina is a 89 y.o. male with chronic combined heart failure, PAF on Eliquis, HTN, CAD, history of TIA, left eye blindness, OSA on CPAP, and CKD stage III admitted on 2/11 due to severe sepsis and acute respiratory failure from bilateral pneumonia consistent with aspiration and hospital-acquired pneumonia. He also had recently been diagnosed with COVID. 2/11 CT chest with worsening right sided infiltrates and improving left sided infiltrates.     Please see interim d/c summary dated 07/14/23 for additional  detail.    ------------------------------------------------------------------------------------  Bilateral aspiration pneumonia/severe sepsis  Afebrile.  WBC normalized.  Completed Augmentin and azithromycin on 2/19.  I discussed the case with Dr. Suzie Portela of pulmonology again today.  We both are in agreement that the patient is at the maximal clinically improved status given his medical comorbidities.  We both continue to stress to the patient and family need for flutter valve and getting out of bed and mobilizing frequently to aid in mobilization of his thick pulmonary secretions which is the largest part of his ongoing respiratory challenge.  He will be discharged with prescriptions for 3%  saline nebs and guaifenesin.     Dysphagia  Status-post EGD on 2/25, showed small hiatal hernia and Schatzki's ring which was dilated.  Speech therapy reevaluated yesterday and recommended minced and moist diet with thin liquids.     COVID-19 virus infection  Completed 10-day course of dexamethasone on 2/25.     Acute hypoxic respiratory failure  Improved.  6-minute walk test today indicated no need for supplemental O2 upon discharge.     Chronic kidney disease stage III  Renal function at baseline.     Essential hypertension  Continue carvedilol.     Chronic combined systolic and diastolic congestive heart failure  Continue Lasix daily.     Paroxysmal atrial fibrillation  Remains on beta-blocker and Eliquis.      Disposition: Home with Home Health (short term rehab  declined by patient/family)  Diet: ADULT DIET; Dysphagia - Minced and Moist  ADULT ORAL NUTRITION SUPPLEMENT; Breakfast, Lunch, Social worker Protein Oral Supplement  ADULT ORAL NUTRITION SUPPLEMENT; Lunch; Frozen Oral Supplement  Code Status: DNR    Follow Ups:  Follow-up Information       Follow up With Specialties Details Why Contact Info    [facility]    [address] [place]  [address]  [phone]    Unknown, Provider, ANP  Schedule an appointment as soon as possible for a visit within 1 week     [facility] Pulmonology Follow up Office will contact you to arrange follow-up visit. [address] [place] [address]  [phone]          Plan was discussed with Patient.  All questions answered.  Patient was stable at time of discharge.  Instructions given to call a physician or return if any concerns.    Pending Labs       Order Current Status    Surgical Pathology Collected (07/18/23 1214)            Current Discharge Medication List        START taking these medications    Details   guaiFENesin (MUCINEX) 600 MG extended release tablet Take 2 tablets by mouth 2 times daily for 14 days Take each dose with at least 8 ounces of water.  Qty: 56 tablet, Refills: 0      sodium chloride, Inhalant, 3 % nebulizer solution Take 4 mLs by nebulization 2 times daily  Qty: 240 mL, Refills: 1    Comments: Dispense 60 vials      dexAMETHasone (DECADRON) 6 MG tablet Take 1 tablet by mouth daily for 5 doses  Qty: 5 tablet, Refills: 0           CONTINUE these medications which have NOT CHANGED    Details   apixaban (ELIQUIS) 2.5 MG TABS tablet Take 1 tablet by mouth 2 times daily  Qty: 60 tablet, Refills: 0      carvedilol (COREG) 6.25 MG tablet Take 1 tablet by mouth 2 times daily (with meals)  Qty: 60 tablet, Refills: 0      lidocaine (LIDODERM) 5 % Place 1 patch onto the skin daily      Multiple Vitamin (MULTIVITAMIN) tablet Take 1 tablet by mouth daily      pantoprazole (PROTONIX) 40 MG tablet Take 1 tablet by mouth daily      prednisoLONE acetate (PRED FORTE) 1 % ophthalmic suspension 1 drop 2 times daily      tiotropium-olodaterol (STIOLTO) 2.5-2.5 MCG/ACT AERS Inhale 2 puffs into the lungs daily  Qty: 1 each, Refills: 11      albuterol sulfate HFA (PROVENTIL;VENTOLIN;PROAIR) 108 (90 Base) MCG/ACT inhaler Inhale 2 puffs into the lungs every 4 hours  as needed for Shortness of Breath  Qty: 1 each, Refills: 11      ascorbic acid (VITAMIN C) 500 MG tablet Take 1 tablet by mouth daily      lactobacillus (CULTURELLE) CAPS capsule Take 1 capsule by mouth daily      rosuvastatin (CRESTOR) 10 MG tablet 3 tablets daily           STOP taking these medications       spironolactone (ALDACTONE) 25 MG tablet Comments:   Reason for Stopping:         sodium bicarbonate  650 MG tablet Comments:   Reason for Stopping:         empagliflozin (JARDIANCE) 10 MG tablet Comments:   Reason for Stopping:         predniSONE (DELTASONE) 20 MG tablet Comments:   Reason for Stopping:         furosemide (LASIX) 20 MG tablet Comments:   Reason for Stopping:         cefpodoxime (VANTIN) 200 MG tablet Comments:   Reason for Stopping:         loratadine (CLARITIN) 10 MG tablet Comments:   Reason for Stopping:         losartan (COZAAR) 25 MG tablet Comments:   Reason for Stopping:         tamsulosin (FLOMAX) 0.4 MG capsule Comments:   Reason for Stopping:         sildenafil (VIAGRA) 100 MG tablet Comments:   Reason for Stopping:         terazosin (HYTRIN) 5 MG capsule Comments:   Reason for Stopping:           Some of the medications may be marked as "stop taking" by the system; but in reality patient or family reported already being off these meds; defer to outpatient/prescribing providers.      Procedures done this admission:  Procedure(s) with comments:  ESOPHAGOGASTRODUODENOSCOPY BIOPSY/balloon dilation - egd with dilation    Consults this admission:  IP CONSULT TO RESPIRATORY CARE  IP CONSULT TO GI  IP CONSULT TO PALLIATIVE CARE  IP CONSULT TO PULMONOLOGY  IP CONSULT TO CASE MANAGEMENT  IP CONSULT HOME HEALTH    Echocardiogram results:  06/28/23    ECHO (TTE) COMPLETE (PRN CONTRAST/BUBBLE/STRAIN/3D) 06/29/2023  3:43 PM (Final)    Interpretation Summary    Left Ventricle: Moderately reduced left ventricular systolic function with a visually estimated EF of 30 - 35%. Left ventricle is  moderately dilated. Normal wall thickness. Grade II diastolic dysfunction with increased LAP.    Aortic Valve: Mild regurgitation. Mild stenosis of the aortic valve (DI ~0.44).    Mitral Valve: Mild to moderate regurgitation with a posterior directed jet.    Left Atrium: Left atrium is mildly dilated.    Image quality is adequate. Contrast used: Lumason.    Signed by: Lenna Gilford, MD on 06/29/2023  3:43 PM      Diagnostic Imaging/Tests:   CT CHEST WO CONTRAST    Result Date: 07/18/2023  Compared to 2 weeks prior, worsened bilateral pneumonia now new cavitating features in the right upper and lower lobes. Findings consistent with cavitary pneumonia. No pleural effusion. Electronically signed by Elsie Stain    XR CHEST PORTABLE    Result Date: 07/12/2023  There are patchy bilateral airspace opacities most prominent in the lower lobes, with somewhat more consolidative appearance in the left lower lobe. *  Heart is normal size *  Pulmonary vascularity is normal *  Mediastinum and bony thorax unremarkable age *  There is no pneumothorax Electronically signed by Charyl Dancer    FL MODIFIED BARIUM SWALLOW W VIDEO    Result Date: 07/06/2023  Please refer to the final report from the speech pathologist. Study began with thin consistency, which the patient tolerated well. During the study a prominent cricopharyngeus was noted. With nectar consistency, prominent vallecular residue was seen, which cleared with swallowing thin consistency. With nectar consistency flash penetration was also seen, with question of aspiration. Patient tolerated the procedure well without immediate complication  Electronically signed by Charyl Dancer    XR FOOT LEFT (2 VIEWS)    Result Date: 07/05/2023     No fractures. Hammertoe deformities Electronically signed by Charyl Dancer    CT CHEST PULMONARY EMBOLISM W CONTRAST    Result Date: 07/04/2023  1.  No pulmonary embolus identified. No thoracic aortic aneurysm/dissection. 2.  Compared to 5 days ago,  overall similar appearing basilar predominant pneumonia. Pneumonia has slightly worsened in the right lower lobe and right upper lobe. Pneumonia slightly improved in the left lower lobe. Worsening mucous plugging in the lower lobes raising possibility of aspiration pneumonia/pneumonitis. Resolved right pleural effusion and unchanged trace left pleural effusion. Electronically signed by Elsie Stain    XR CHEST 1 VIEW    Result Date: 07/04/2023      Silhouetting of the left hemidiaphragm which may represent a left lower lobe pneumonia : On the prior chest CT., The patient had a left basilar airspace process, which was not well appreciated on today's study Electronically signed by Charyl Dancer    CT CHEST PULMONARY EMBOLISM W CONTRAST    Result Date: 06/29/2023  1.  No pulmonary embolism. No thoracic aortic aneurysm/dissection. 2.  Bibasilar pneumonia, greater on the left with trace parapneumonic effusions. 3.  No definite thoracic metastatic disease. 4.  Stable left adrenal nodule. Electronically signed by Elsie Stain    XR CHEST PORTABLE    Result Date: 06/28/2023  Findings/impression: Questionable ill-defined opacity of the retrocardiac region without other acute cardiopulmonary finding. Dedicated PA/lateral chest would be useful for further evaluation. Electronically signed by Gifford Shave       Labs: Results:       BMP, Mg, Phos Recent Labs     07/19/23  0627   NA 132*   K 4.6   CL 99   CO2 22   ANIONGAP 11   BUN 37*   CREATININE 1.31*   LABGLOM 51*   CALCIUM 8.4*   GLUCOSE 101*      CBC Recent Labs     07/19/23  0627 07/20/23  0625   WBC 4.8 6.2   RBC 2.48* 2.74*   HGB 7.1* 8.1*   HCT 21.4* 23.4*   MCV 86.3 85.4   MCH 28.6 29.6   MCHC 33.2 34.6   RDW 15.7* 15.5*   PLT 120* 122*   MPV 12.1 12.1   NRBC 0.00 0.00   LYMPHOPCT 9.3* 10.2*   MONOPCT 14.7* 15.2*   BASOPCT 0.6 0.6   IMMGRAN 4.3 4.2   LYMPHSABS 0.45* 0.63   EOSABS 0.01 0.01   MONOSABS 0.71 0.94   BASOSABS 0.03 0.04   ABSIMMGRAN 0.21 0.26      LFT No results for  input(s): "BILITOT", "BILIDIR", "ALKPHOS", "AST", "ALT", "LABALBU", "GLOB" in the last 72 hours.    Invalid input(s): "PROT"   Cardiac  No results found for: "NTPROBNP", "TROPHS"   Coags Lab Results   Component Value Date/Time    PROTIME 15.1 06/29/2023 10:15 AM    INR 1.2 06/29/2023 10:15 AM    APTT 33.9 06/29/2023 10:15 AM      A1c No results found for: "LABA1C", "EAG"   Lipids No results found for: "CHOL", "HDL", "CHOLHDLRATIO", "TRIG"   Thyroid  No results found for: "TSHELE", "TSH3GEN"     Most Recent UA Lab Results   Component Value Date/Time    COLORU YELLOW/STRAW 07/13/2023 01:22 PM    APPEARANCE CLEAR 07/13/2023 01:22 PM  PROTEINU TRACE 07/13/2023 01:22 PM    GLUCOSEU Negative 07/13/2023 01:22 PM    KETUA Negative 07/13/2023 01:22 PM    BILIRUBINUR Negative 07/13/2023 01:22 PM    BLOODU Negative 07/13/2023 01:22 PM    UROBILINOGEN 0.2 07/13/2023 01:22 PM    NITRU Negative 07/13/2023 01:22 PM    LEUKOCYTESUR TRACE 07/13/2023 01:22 PM    WBCUA 0-3 07/13/2023 01:22 PM    RBCUA 0-3 07/13/2023 01:22 PM    BACTERIA 0 07/13/2023 01:22 PM    LABCAST HYALINE 07/13/2023 01:22 PM    MUCUS 0 06/29/2023 04:50 AM        Microbiology:  Results       Procedure Component Value Units Date/Time    Culture, Respiratory (with Gram Stain) [[id number]] Collected: 07/18/23 1018    Order Status: Completed Specimen: Sputum Expectorated Updated: 07/21/23 0809     Special Requests NO SPECIAL REQUESTS        Gram Stain FEW WBCS SEEN PER LPF               OCCASIONAL EPITHELIAL CELLS SEEN            FEW Gram positive cocci         4+ MUCUS PRESENT        Culture       HEAVY NORMAL RESPIRATORY FLORA          C. difficile toxin Molecular [[id number]] Collected: 07/13/23 1200    Order Status: Canceled     C. difficile toxin Molecular [[id number]] Collected: 07/13/23 1145    Order Status: Canceled Specimen: Stool     Culture, Stool [[id number]] Collected: 07/13/23 1145    Order Status: Completed Specimen: Stool Updated: 07/15/23 0845      Special Requests NO SPECIAL REQUESTS        Culture       No Salmonella, Shigella, or Ecoli 0157 isolated.                  No normal enteric gram negative rods isolated.          Ova and Parasite Examination [[id number]] Collected: 07/13/23 1145    Order Status: Completed Specimen: Stool Updated: 07/17/23 1236     Sample Site STOOL        Ova + Parasite Exam Final Report Below        Comment: (NOTE)  These results were obtained using wet preparation(s) and trichrome  stained smear. This test does not include testing for  Cryptosporidium parvum, Cyclospora, or Microsporidia.  Performed At: [facility]  [address] [place], [place] [id number]  Idelle Crouch T MD [phone]         C. difficile toxin Molecular [[id number]] Collected: 07/09/23 1359    Order Status: Canceled Specimen: Stool             All Labs from Last 24 Hrs:  Recent Results (from the past 24 hour(s))   POCT Glucose    Collection Time: 07/21/23  4:47 AM   Result Value Ref Range    POC Glucose 126 (H) 65 - 100 mg/dL    Performed by: JisonKareenStephanieRNBS        No results for input(s): "COVID19" in the last 72 hours.    Recent Vital Data:  Patient Vitals for the past 24 hrs:   Temp Pulse Resp BP SpO2   07/21/23 1129 97.7 F (36.5 C) 83 18 123/62 92 %   07/21/23 0923 -- 83 -- -- 92 %  07/21/23 0832 -- -- -- 121/63 --   07/21/23 0802 -- 73 17 -- 96 %   07/21/23 0750 97.5 F (36.4 C) 81 18 121/63 98 %   07/21/23 0423 97.3 F (36.3 C) 79 18 125/65 98 %   07/20/23 2315 97.3 F (36.3 C) 70 17 (!) 113/59 99 %   07/20/23 2015 -- 54 16 -- 99 %   07/20/23 1925 97.2 F (36.2 C) 77 17 (!) 95/51 97 %       Oxygen Therapy  SpO2: 92 %  Pulse via Oximetry: 69 beats per minute  Pulse Oximeter Device Mode: Intermittent  Pulse Oximeter Device Location: Left, Finger  O2 Device: None (Room air)  Skin Assessment: Clean, dry, & intact  FiO2 : 32 %  O2 Flow Rate (L/min): 0 L/min    Estimated body mass index is 29.05 kg/m as calculated from  the following:    Height as of this encounter: 1.676 m (5\' 6" ).    Weight as of this encounter: 81.6 kg (180 lb).    Intake/Output Summary (Last 24 hours) at 07/21/2023 1154  Last data filed at 07/21/2023 0423  Gross per 24 hour   Intake --   Output 500 ml   Net -500 ml         Physical Exam:  General:    Frail-appearing elderly male seated in recliner, NAD.  Head:  Normocephalic, atraumatic  Eyes:  Sclerae appear normal.  Pupils equally round.    HENT:  Nares appear normal, no drainage.  Moist mucous membranes.  Neck:  No restricted ROM.  Trachea midline.  CV:   RRR.  No m/r/g.  No JVD.  No LE edema.  Lungs:   Scattered rhonchi.  No wheezing, rhonchi, or rales.  No increased WOB.  On RA.  Abdomen:   Soft, nontender, nondistended.    Skin:     No rashes.  Normal coloration  Neuro:  AAOx3.  CN II-XII grossly intact.  Psych:  Normal mood and affect.      Time spent in patient discharge and coordination 38 minutes.      Signed:  Norval Morton, M.D.  Hospitalist  07/21/23  11:54 AM

## 2023-07-21 NOTE — Progress Notes (Signed)
 SPEECH PATHOLOGY NOTE:    Speech therapy attempted to see patient. Patient not present in room, walker at bedside, at that time. Patient has discharge orders in. Patient is currently on Minced and moist diet with thin liquids. Medications whole floated in puree or applesauce and crushed in puree or applesauce, as permitted by pharmacy. Recommended HH speech therapy services post acute care.     Cyndie Chime, SLP

## 2023-07-21 NOTE — Progress Notes (Signed)
 Paul Medina  Admission Date: 07/04/2023         Daily Progress Note: 07/21/2023    The patient's chart is reviewed and the patient is discussed with the staff.    Background: 89 y.o. male with chronic combined heart failure, PAF on Eliquis, HTN, CAD, history of TIA, left eye blindness, OSA on CPAP, and CKD stage III admitted on 2/11 due to severe sepsis and acute respiratory failure from bilateral pneumonia consistent with aspiration and hospital-acquired pneumonia. He also had recently been diagnosed with COVID and treated   Subjective:     Yesterday he ambulated with PT for 10 feet and was very dyspneic.  Wants to go home with HHPT.  Now on 3lpm of oxygen with saturation of 99%.  Sputum culture is with few GPCs.      Current Facility-Administered Medications   Medication Dose Route Frequency    sodium chloride (Inhalant) 3 % nebulizer solution 4 mL  4 mL Nebulization BID    guaiFENesin (MUCINEX) extended release tablet 1,200 mg  1,200 mg Oral BID    saliva substitute (BIOTENE/MOUTH KOTE) liquid   Oral TID PRN    magic (miracle) mouthwash  10 mL Swish & Spit 4x Daily PRN    nystatin (MYCOSTATIN) 100000 UNIT/ML suspension 500,000 Units  5 mL Oral 4x Daily    apixaban (ELIQUIS) tablet 5 mg  5 mg Oral 2 times per day    lactobacillus (CULTURELLE) capsule 1 capsule  1 capsule Oral Daily with breakfast    prednisoLONE acetate (PRED FORTE) 1 % ophthalmic suspension 1 drop  1 drop Left Eye Daily    medicated lip ointment (BLISTEX)   Topical PRN    albuterol (PROVENTIL) (2.5 MG/3ML) 0.083% nebulizer solution 2.5 mg  2.5 mg Nebulization Q4H PRN    ipratropium 0.5 mg-albuterol 2.5 mg (DUONEB) nebulizer solution 1 Dose  1 Dose Inhalation BID RT    rosuvastatin (CRESTOR) tablet 10 mg  10 mg Oral Nightly    sodium chloride (Inhalant) 3 % nebulizer solution 4 mL  4 mL Nebulization PRN    arformoterol tartrate (BROVANA) nebulizer solution 15 mcg  15 mcg Nebulization BID RT    atropine 1 % ophthalmic solution 1  drop  1 drop Left Eye Nightly    brimonidine (ALPHAGAN) 0.2 % ophthalmic solution 1 drop  1 drop Left Eye TID    sodium chloride flush 0.9 % injection 5-40 mL  5-40 mL IntraVENous 2 times per day    sodium chloride flush 0.9 % injection 5-40 mL  5-40 mL IntraVENous PRN    0.9 % sodium chloride infusion   IntraVENous PRN    potassium chloride (KLOR-CON M) extended release tablet 40 mEq  40 mEq Oral PRN    Or    potassium bicarb-citric acid (EFFER-K) effervescent tablet 40 mEq  40 mEq Oral PRN    Or    potassium chloride 10 mEq/100 mL IVPB (Peripheral Line)  10 mEq IntraVENous PRN    magnesium sulfate 2000 mg in 50 mL IVPB premix  2,000 mg IntraVENous PRN    ondansetron (ZOFRAN-ODT) disintegrating tablet 4 mg  4 mg Oral Q8H PRN    Or    ondansetron (ZOFRAN) injection 4 mg  4 mg IntraVENous Q6H PRN    polyethylene glycol (GLYCOLAX) packet 17 g  17 g Oral Daily PRN    acetaminophen (TYLENOL) tablet 650 mg  650 mg Oral Q6H PRN    Or    acetaminophen (TYLENOL) suppository 650 mg  650 mg Rectal Q6H PRN    carvedilol (COREG) tablet 6.25 mg  6.25 mg Oral BID WC    doxazosin (CARDURA) tablet 4 mg  4 mg Oral Daily    pantoprazole (PROTONIX) tablet 40 mg  40 mg Oral QAM AC    furosemide (LASIX) tablet 20 mg  20 mg Oral Daily     Review of Systems: Unable to obtain due to patient factors.   Objective:   Blood pressure 125/65, pulse 79, temperature 97.3 F (36.3 C), temperature source Oral, resp. rate 18, height 1.676 m (5\' 6" ), weight 81.6 kg (180 lb), SpO2 98%.   Intake/Output Summary (Last 24 hours) at 07/21/2023 0752  Last data filed at 07/21/2023 0423  Gross per 24 hour   Intake --   Output 500 ml   Net -500 ml     Physical Exam:   Constitutional:  the patient is well developed and in no acute distress  EENMT:  Sclera clear, pupils equal, oral mucosa moist  Respiratory: symmetric chest rise. Rhonchi slightly improved  Cardiovascular:  RRR without M,G,R. There is no edema  Gastrointestinal: soft and non-tender; with positive  bowel sounds.  Musculoskeletal: warm without cyanosis. Normal muscle tone.   Skin:  no jaundice or rashes, no wounds  Neurologic: symmetric strength, fluent speech  Psychiatric:  calm, appropriate, oriented x 4    Imaging: I performed an independent interpretation of the patient's images.  CXR:          CT from now vs 2 weeks vs 3 weeks. Clearing left base and some on right but diffuse infitrates/tree and bud in both lung.           LAB:  Recent Labs     07/19/23  0627 07/20/23  0625   WBC 4.8 6.2   HGB 7.1* 8.1*   HCT 21.4* 23.4*   PLT 120* 122*     Recent Labs     07/19/23  0627   NA 132*   K 4.6   CL 99   CO2 22   BUN 37*   CREATININE 1.31*     No results for input(s): "TROPHS", "NTPROBNP", "CRP", "ESR" in the last 72 hours.  Recent Labs     07/19/23  0627   GLUCOSE 101*      Microbiology:   @MICRORESULTS @  Recent Labs     07/18/23  1018   CULTURE CULTURE IN PROGRESS,FURTHER UPDATES TO FOLLOW     No results for input(s): "COVID19" in the last 72 hours.  ECHO: 06/28/23    ECHO (TTE) COMPLETE (PRN CONTRAST/BUBBLE/STRAIN/3D) 06/29/2023  3:43 PM (Final)    Interpretation Summary    Left Ventricle: Moderately reduced left ventricular systolic function with a visually estimated EF of 30 - 35%. Left ventricle is moderately dilated. Normal wall thickness. Grade II diastolic dysfunction with increased LAP.    Aortic Valve: Mild regurgitation. Mild stenosis of the aortic valve (DI ~0.44).    Mitral Valve: Mild to moderate regurgitation with a posterior directed jet.    Left Atrium: Left atrium is mildly dilated.    Image quality is adequate. Contrast used: Lumason.    Signed by: Lenna Gilford, MD on 06/29/2023  3:43 PM    Assessment and Plan:  (Medical Decision Making)     Mucus plugging/ineffective airway clearance  Needs to mobilize more.  Continue coaching him with the flutter valve.  10 breaths out through it at least twice a day.  Completes mucomyst today.  Plan to continue flutter valve and 3% saline nebs at home.       Aspiration pneumonia  Few GPCs on culture.  No current indication for more antibiotics.      Debility  Significant.     Hypoxia  Will need ambulating oximetry prior to discharge.  RT to perform today.        More than 50% of the time documented was spent in face-to-face contact with the patient and in the care of the patient on the floor/unit where the patient is located.    Harriett Rush, MD

## 2023-07-21 NOTE — Plan of Care (Signed)
 Problem: Discharge Planning  Goal: Discharge to home or other facility with appropriate resources  07/21/2023 1249 by Jerilee Field, RN  Outcome: Progressing  07/20/2023 2314 by Donell Sievert I, RN  Outcome: Progressing     Problem: Safety - Adult  Goal: Free from fall injury  07/21/2023 1249 by Jerilee Field, RN  Outcome: Progressing  07/20/2023 2314 by Donell Sievert I, RN  Outcome: Progressing     Problem: ABCDS Injury Assessment  Goal: Absence of physical injury  07/21/2023 1249 by Jerilee Field, RN  Outcome: Progressing  07/20/2023 2314 by Donell Sievert I, RN  Outcome: Progressing     Problem: Respiratory - Adult  Goal: Achieves optimal ventilation and oxygenation  07/21/2023 1249 by Jerilee Field, RN  Outcome: Progressing  07/20/2023 2314 by Donell Sievert I, RN  Outcome: Progressing     Problem: Skin/Tissue Integrity  Goal: Skin integrity remains intact  Description: 1.  Monitor for areas of redness and/or skin breakdown  2.  Assess vascular access sites hourly  3.  Every 4-6 hours minimum:  Change oxygen saturation probe site  4.  Every 4-6 hours:  If on nasal continuous positive airway pressure, respiratory therapy assess nares and determine need for appliance change or resting period  07/21/2023 1249 by Jerilee Field, RN  Outcome: Progressing  07/20/2023 2314 by Donell Sievert I, RN  Outcome: Progressing

## 2023-07-21 NOTE — Progress Notes (Signed)
 Discharge instructions given to Paul Medina, Paul Medina, and daughter Clarene Critchley. Both verbalized understanding of all discharge instructions; written and verbal. Adequate time given for all questions, all questions asked/answered. Discharge medication reviewed as appropriate and all needed prescriptions discussed. Patient discharged home via private transportation.

## 2023-07-21 NOTE — Care Coordination-Inpatient (Signed)
 Pt is for discharge home to daughter Eve's home with [facility] PT/OT/SLP/RN/Aide/MSW services. Pt and daughter verbalized agreement with discharge plan. CM encouraged Eve to contact [facility] to set-up first visit.     Pt completed walk-test and does not qualify for home oxygen.     Please notify CM for any needs that may arise.     Gardiner Fanti, MSW, LBSW  Social Work Case Print production planner [facility]         07/21/23 1347   Service Assessment   Patient's Healthcare Decision Maker is: Armed forces operational officer Next of Kin   Social/Functional History   Lives With Alone   Type of Home Senior housing apartment   Home Layout One level   Home Access Level entry;Elevator   Bathroom Shower/Tub Walk-in Theme park manager Grab bars in shower;Grab bars around toilet   Bathroom Accessibility Accessible   Home Equipment None   Receives Help From Family   Prior Level of Assist for ADLs Independent   Prior Level of Assist for [employer] Independent   Ambulation Assistance Independent   Prior Level of Assist for Transfers Independent   Occupation Retired   Games developer Discharge   Transition of Care Consult (CM Consult) Discharge Planning;Home Health   Internal Home Health No   Reason Outside Agency Chosen Patient already serviced by other home care/hospice agency   Services At/After Discharge Nursing services;OT;PT;RT;SLP;Home Health;Aide services  (MSW)   [employer] Information Provided? Yes   Mode of Transport at Discharge Self   Confirm Follow Up Transport Family   Condition of Participation: Discharge Planning   The Plan for Transition of Care is related to the following treatment goals: Patient to return to daughter's home with [facility] PT/OT/SLP/RN/Aide/MSW services.   The Patient and/or Patient Representative was provided with a Choice of Provider? Patient;Patient Representative   Name of the Patient Representative who was provided with the Choice of Provider and agrees with the Discharge  Plan?  daughter Clarene Critchley   The Patient and/Or Patient Representative agree with the Discharge Plan? Yes   Freedom of Choice list was provided with basic dialogue that supports the patient's individualized plan of care/goals, treatment preferences, and shares the quality data associated with the providers?  Yes

## 2023-07-21 NOTE — Progress Notes (Signed)
 07/21/23 1145   Oxygen Therapy/Pulse Ox   O2 Device None (Room air)   Pulse 91   SpO2 96 %     Patient's sat while resting was 96% on room air before ambulation.  While ambulating patient required no supplemental oxygen to maintain sat above protocol.   Patient denied SOB, and no distress noted while ambulating.     Medicare has specific guidelines for documentation of ambulatory oxygen saturation testing, therefore all test results must be documented in the patient's EMR as outlined below.     Room air: SpO2 with O2 and liter flow   Resting SpO2  96%  N/A   Ambulating SpO2  93% RESULTS: N/A    While ambulating patient required no supplemental oxygen to maintain sat above protocol.              Respiratory Care Services     Policy Number: [id number]    Title: Home Oxygen Assessment Protocol    Effective Date:  08/29/12    Reviewed Date: 10/17/2016, 03/2019    Revised: 11/2017       Policy: The Respiratory Care Practitioner (RCP) shall assess all patients who meet Medicare's Home Oxygen Diagnostic Requirements. If a patient is unable to wean to room air within 48 hours of discharge, the RCP shall order a 3 step ambulatory oxygen saturation (SpO2) per protocol and instruct the patient in completing the test. The RCP shall document results of the test as outlined in this protocol.    Purpose: Oxygen is used for short-term hospitalized patients and long-term therapy in patients with chronic lung disease and recurring congestive heart failure. Centers for [employer] (CMS) has very specific guidelines and indications for its short-term use in the acute care setting and the long-term use in the home or other facility. This protocol will address the rationale and requirements for long-term oxygen therapy. Long-term oxygen therapy is delivered to reduce long-term complications of chronic hypoxemia, particularly cor pulmonale. Oxygen supplementation in patients with chronic hypoxemia has been shown to improve survival,  pulmonary hemodynamics, exercise capacity and neuropsychological performance.1    Responsibility: Director of Respiratory Care Services and Respiratory Care Practitioners.     Home Oxygen Diagnostic Requirements:   Covered health conditions:    Severe primary lung disease   Chronic obstructive pulmonary disease  Diffuse interstitial lung disease  Cystic fibrosis  Bronchiectasis  Pulmonary neoplasm, primary or metastatic  Chronic bronchitis  Emphysema  Hypoxia-related symptoms or conditions that may improve with oxygen therapy such as  Pulmonary hypertension  Recurring congestive heart failure due to chronic cor pulmonale  Erythocytosis/erythrocythemia  Qualifying testing requirements  Testing must be performed within two days prior to discharge.  Test results must be documented in patient's medical record in approved format.   Testing can be done under three conditions  A test during rest.   Oxygen is considered medically necessary if SpO2 is less than or equal to 88%.  If SpO2 is greater than 88%, proceed to step 2.  A test taken on room air during exercise  If patient meets qualifying threshold of SpO2 less than or equal to 88% while exercising, all three of the required tests below must be performed within same testing session and be recorded in the patient's medical record.  A test taken during rest while patient breathes room air.  A test during exercise while patient breathes room air.  A test taken during exercise with oxygen applied. (to demonstrate improvement of hypoxia).  A  test taken during sleep.   If patient is tested during sleep, test must have at least two hours of recorded time.  Test must indicate arterial oxygen saturation of 88% or less for at least 5 minutes of   testing period.  A patient tested during sleep will not qualify for portable oxygen.  A physician order will be required for an overnight oximetry test.  Regardless of test condition, the following values apply to all:  Group I:  (Requires annual recertification)  Patient is on room air while at rest (awake) when tested.   Arterial oxygen saturation (SaO2) is at or below 88% or   PaO2 is at or below <9mm Hg   Group II: (Recertification and retesting are required 61-90 days after initial start)  Patient is on room air at rest while awake when tested.  PaO2 = 56-59 mmHg or  SaO2 89% acceptable only with secondary diagnosis of  Edema suggesting congestive heart failure  Pulmonary hypertension/cor pulmonale with P wave > 3mm in lead II, III, or AVF  Erythrocythemia with Hct >56%  Group III:   If PaO2 > 60 mmHg or   SaO2 >90% there is a presumption of noncoverage.  If liter flow is greater than 4LPM, patient must meet Group 1 or Group II criteria while patient is receiving oxygen at a rate of 4LPM or higher  All patients must be tested in a stable state including those discharged from the hospital.  All coexisting diseases or conditions that can cause hypoxia must be treated and patient must be tested in a chronic stable state before oxygen therapy is qualified.    Procedure for SpO2 testing at rest.  Obtain a 30 second room air SpO2 check while patient is on room air, rested and awake.  If SpO2 is 88% increase O2 by 1 L to maintain SpO2 of 90%.  Document results in patient's EMR.    Procedure for ambulatory SpO2 testing  For patient who meets the Covered Health Conditions and is unable to wean to room air within 48 hours of discharge, complete an ambulatory SaO2.   Preparation  Write an order for 3 step ambulatory oxygen saturation test per protocol.  Obtain pulse oximeter and insure that it is working accurately.  Perform hand hygiene per hospital policy utilizing Standard Precautions for all patients and following transmission-based isolation as indicated per hospital policy.  Identify patient, verify name and birth date via ID bracelet.  Introduce self and identify department.  Explain procedure to patient and family and confirm  understanding.  Assessment  Assure that patient is on room air prior to test.  Fingernail polish if worn by patient must be removed before testing.   Ask patient to sit in an upright position.  Resting SaO2 assessment   Perform a 30 second resting room air SaO2.   If SaO2 is 88% or less Medicare considers oxygen is medically necessary   Document findings in patient EMR.    If SaO2 is greater than 88%, proceed to the next step.  Ambulatory Room Air SaO2 check  Ask patient to walk for 5 minutes on room air or as long as tolerated.  If patients ordinarily use a cane, walker or rollator, they should be used during test.  Instruct patient to walk at a comfortable pace and to breathe naturally during test.  Monitor and record patient's heart rate, SaO2 and exercise endurance.  If SaO2 is 88% or less post exercise, an ambulatory test on oxygen  must be performed.  Ambulatory SaO2 on Oxygen  The RCP shall place the patient on oxygen to achieve a SaO2 of 90%.  Instruct patient to walk for 5 minutes or as long as tolerated.  If patients ordinarily use a cane, walker or rollator, they should be used during test  Monitor and record patient's heart rate, SaO2 and exercise endurance.  If Sa02 decreases to the range of 80% to 84% the flow shall be increased by 2LPM.   If SaO2 decreases to the range of 85%-89% increased oxygen by 1 LPM.  If SaO2 is less than 80%, the patient is not appropriate for ambulation.    Documentation   Medicare has specific guidelines for documentation of ambulatory oxygen saturation testing, therefore all test results must be documented in the patient's EMR as outlined below.     Room air: SpO2 with O2 and liter flow   Resting SpO2  96%  N/A   Ambulating SpO2  93% RESULTS: N/A    While ambulating patient required no supplemental oxygen to maintain sat above protocol.      Reference:       CMS Center for [facility] & Medicaid Services. Home Oxygen Therapy. Medicare        Part B- Oxygen Coverage

## 2023-07-25 NOTE — Telephone Encounter (Signed)
 LVM for someone to call and set up patient hospital patient   WT 07/25/23

## 2023-08-08 LAB — HEMOGLOBIN A1C
Estimated Avg Glucose, External: 103 mg/dL
Hemoglobin A1C, External: 5.2 % (ref <5.7)

## 2023-08-21 ENCOUNTER — Encounter
Payer: PRIVATE HEALTH INSURANCE | Attending: Student in an Organized Health Care Education/Training Program | Primary: Diagnostic Radiology

## 2023-09-01 NOTE — Telephone Encounter (Signed)
 LVM with daughter reminding her that her father need to bring PAP and SS to upcoming appt.

## 2023-09-04 ENCOUNTER — Encounter: Payer: PRIVATE HEALTH INSURANCE | Attending: Family | Primary: Diagnostic Radiology

## 2023-10-04 ENCOUNTER — Encounter: Payer: 59 | Attending: Physician Assistant | Primary: Diagnostic Radiology

## 2023-11-02 ENCOUNTER — Encounter: Payer: 59 | Attending: Nurse Practitioner | Primary: Diagnostic Radiology

## 2023-12-13 ENCOUNTER — Encounter: Payer: 59 | Attending: Family | Primary: Diagnostic Radiology

## 2023-12-28 ENCOUNTER — Ambulatory Visit
Admit: 2023-12-28 | Discharge: 2023-12-28 | Payer: 59 | Attending: Critical Care Medicine | Primary: Diagnostic Radiology

## 2023-12-28 DIAGNOSIS — G4733 Obstructive sleep apnea (adult) (pediatric): Principal | ICD-10-CM

## 2023-12-28 NOTE — Progress Notes (Signed)
 [facility]: New Patient Evaluation.  [address]  [place], [place] [address]  [phone]    Patient Name:  Paul Medina    Date of Birth:  Oct 04, 1930            Date of Service:  12/28/2023    Chief Complaint   Patient presents with    New Patient       History of Present Illness:  This pleasant gentleman came in today to get established with [facility].    Patient reports has had sleep apnea for over 30 years and with the [facility] did get an AutoPap at 6/20 and using a fullface mask.  He is not sure if the mask is the right size since it seems little small for him.  But he indicates the [facility] told him that is the only size they can get and it is a medium.  Apparently compliance data shows he has used it 284 days out of 365 days.  The days he did not use it he was hospitalized from January till March with COVID and pneumonia and was even in short-term rehab and did have bouts of gout during that time.    Patient currently has used his machine 284 days and 281 days of more than 4 hours averages about 8 hours and 9 minutes unfortunately he is on a machine of 6-20 cm H2O.  95th percentile pressure 12.5 and 95 percentile air leak is 88.2 L/min with an AHI of 21.9.  Apneas are about 18.3 with mostly unknown variety.  He does about 6.8 central events.    Patient's Epworth score is elevated at 12/24.  He does report he feels fine 5 or 6 days out of the week.  He rarely naps at all.  He does report he has some issues with his memory as well as concentration and he does sweat a bunch and he does have dry mouth was a common problem for him.  He also reports that he has been losing some weight about 11 pounds, but does eat.  He attributes that to some of the diarrhea he had when he was taking the gout medication.  He also had some nausea and vomiting recently a few times and he felt that was from a recent ingestion of peanut butter that he still has at home.    He would just like some new  supplies from the [facility]            12/25/2023    10:08 AM   Sleep Medicine   Sitting and reading 2   Watching TV 3   Sitting, inactive in a public place (e.g. a theatre or a meeting) 2   As a passenger in a car for an hour without a break 1   Lying down to rest in the afternoon when circumstances permit 3   Sitting and talking to someone 0   Sitting quietly after a lunch without alcohol 1   In a car, while stopped for a few minutes in traffic 0   Epworth Sleepiness Score 12     Download:       DIAGNOSTIC TESTS/RESULTS           No data to display                Past Medical History:   Diagnosis Date    Arthritis     Asthma     Chronic bronchitis (HCC)  Hypertension     Lung cancer [facility])          Patient Active Problem List   Diagnosis    Primary hypertension    HLD (hyperlipidemia)    Coronary atherosclerosis of native coronary artery    History of TIA (transient ischemic attack)    Amaurosis fugax of left eye    OSA (obstructive sleep apnea)    Atrial fibrillation with rapid ventricular response (HCC)    Stage 3 chronic kidney disease (HCC)    COVID    Moderate asthma with exacerbation    Chronic combined systolic and diastolic heart failure (HCC)    COVID-19 virus infection    Pancytopenia (HCC)    Severe sepsis (HCC)    Paroxysmal atrial fibrillation (HCC)    Aspiration pneumonia of both lungs (HCC)    Acute respiratory failure with hypoxia (HCC)    Aspiration pneumonia (HCC)    History of B-cell lymphoma    DNR (do not resuscitate)    Oropharyngeal dysphagia    Dysphagia    Pneumonia of right lung due to infectious organism    Ineffective airway clearance          Past Surgical History:   Procedure Laterality Date    UPPER GASTROINTESTINAL ENDOSCOPY N/A 07/18/2023    ESOPHAGOGASTRODUODENOSCOPY BIOPSY/balloon dilation performed by Emelia Okey Barter, MD at [facility] ENDOSCOPY         Social History     Socioeconomic History    Marital status: Single     Spouse name: Not on file    Number of children: 2    Years of  education: Not on file    Highest education level: Not on file   Occupational History    Occupation: Lives in the retirement community   Tobacco Use    Smoking status: Former     Current packs/day: 0.00     Average packs/day: 1 pack/day for 34.0 years (34.0 ttl pk-yrs)     Types: Cigarettes     Start date: 05/31/1954     Quit date: 1990     Years since quitting: 35.6    Smokeless tobacco: Never    Tobacco comments:     Smoked a pack a day for about 46 years.  Quit about 2001   Vaping Use    Vaping status: Never Used   Substance and Sexual Activity    Alcohol use: Never    Drug use: Never    Sexual activity: Not Currently     Partners: Female   Other Topics Concern    Not on file   Social History Narrative    Has no pets.    We really drinks any caffeine     Social Drivers of Psychologist, prison and probation services Strain: Low Risk  (10/21/2022)    Received from [facility]    Overall Financial Resource Strain (CARDIA)     Difficulty of Paying Living Expenses: Not very hard   Food Insecurity: No Food Insecurity (07/04/2023)    Hunger Vital Sign     Worried About Running Out of Food in the Last Year: Never true     Ran Out of Food in the Last Year: Never true   Transportation Needs: No Transportation Needs (07/04/2023)    PRAPARE - Therapist, art (Medical): No     Lack of Transportation (Non-Medical): No   Physical Activity: Unknown (10/21/2022)    Received from [facility]  Exercise Vital Sign     Days of Exercise per Week: 0 days     Minutes of Exercise per Session: Not on file   Stress: Stress Concern Present (10/21/2022)    Received from [facility] - Occupational Stress Questionnaire     Feeling of Stress : To some extent   Social Connections: Unknown (02/23/2023)    Received from [facility]    Social Connections     Frequency of Communication with Friends and Family: Not asked     Frequency of Social Gatherings with Friends and Family: Not asked    Intimate Partner Violence: Unknown (02/23/2023)    Received from [facility]    Intimate Partner Violence     Fear of Current or Ex-Partner: Not asked     Emotionally Abused: Not asked     Physically Abused: Not asked     Sexually Abused: Not asked   Housing Stability: Low Risk  (07/04/2023)    Housing Stability Vital Sign     Unable to Pay for Housing in the Last Year: No     Number of Times Moved in the Last Year: 1     Homeless in the Last Year: No            History reviewed. No pertinent family history.      Allergies   Allergen Reactions    Ciprofloxacin Other (See Comments)     Other Reaction(s): Joint swelling-Intolerance    Doxycycline  Nausea And Vomiting and Nausea Only     Other Reaction(s): GI Intolerance    Lisinopril Cough and Other (See Comments)     Other Reaction(s): Cough, Cough-Intolerance, Other (See Comments)    cough    cough   cough   cough    Other Other (See Comments) and Nausea And Vomiting     Causes sneezing, itchy/runny nose    Other Reaction(s): GI Intolerance    unknown    unknown, unknown    unknown   unknown    Causes sneezing, itchy/runny nose   Causes sneezing, itchy/runny nose   Causes sneezing, itchy/runny nose    Sulfa Antibiotics Nausea And Vomiting, Other (See Comments) and Rash     Other Reaction(s): GI Intolerance, Other- (not listed) - Allergy, Unknown    Unknown    Sulfasalazine Rash    Sulfur  Itching and Nausea Only         Current Outpatient Medications   Medication Sig    torsemide (DEMADEX) 20 MG tablet Take 1 tablet by mouth daily    furosemide  (LASIX ) 20 MG tablet Take 1 tablet by mouth daily    sodium chloride , Inhalant, 3 % nebulizer solution Take 4 mLs by nebulization 2 times daily    apixaban  (ELIQUIS ) 2.5 MG TABS tablet Take 1 tablet by mouth 2 times daily    carvedilol  (COREG ) 6.25 MG tablet Take 1 tablet by mouth 2 times daily (with meals)    ascorbic acid (VITAMIN C) 500 MG tablet Take 1 tablet by mouth daily    lactobacillus (CULTURELLE) CAPS capsule Take  1 capsule by mouth daily    lidocaine  (LIDODERM ) 5 % Place 1 patch onto the skin daily    Multiple Vitamin (MULTIVITAMIN) tablet Take 1 tablet by mouth daily    pantoprazole  (PROTONIX ) 40 MG tablet Take 1 tablet by mouth daily    prednisoLONE  acetate (PRED FORTE ) 1 % ophthalmic suspension 1 drop 2 times daily  rosuvastatin  (CRESTOR ) 10 MG tablet 3 tablets daily    tiotropium-olodaterol (STIOLTO) 2.5-2.5 MCG/ACT AERS Inhale 2 puffs into the lungs daily    albuterol  sulfate HFA (PROVENTIL ;VENTOLIN ;PROAIR ) 108 (90 Base) MCG/ACT inhaler Inhale 2 puffs into the lungs every 4 hours as needed for Shortness of Breath    aspirin  81 MG chewable tablet Take 1 tablet by mouth     No current facility-administered medications for this visit.         SUBJECTIVE:  Review of Systems   Constitutional:  Positive for fatigue and unexpected weight change.   HENT: Negative.     Eyes: Negative.    Respiratory: Negative.     Cardiovascular: Negative.    Gastrointestinal: Negative.    Endocrine: Negative.    Genitourinary: Negative.    Musculoskeletal:  Positive for gait problem.        Use a rollator   Skin: Negative.    Allergic/Immunologic: Negative.    Hematological:         Positive for prostate cancer and lymphoma   Psychiatric/Behavioral:  Positive for decreased concentration and sleep disturbance.         Allergies   Allergen Reactions    Ciprofloxacin Other (See Comments)     Other Reaction(s): Joint swelling-Intolerance    Doxycycline  Nausea And Vomiting and Nausea Only     Other Reaction(s): GI Intolerance    Lisinopril Cough and Other (See Comments)     Other Reaction(s): Cough, Cough-Intolerance, Other (See Comments)    cough    cough   cough   cough    Other Other (See Comments) and Nausea And Vomiting     Causes sneezing, itchy/runny nose    Other Reaction(s): GI Intolerance    unknown    unknown, unknown    unknown   unknown    Causes sneezing, itchy/runny nose   Causes sneezing, itchy/runny nose   Causes sneezing,  itchy/runny nose    Sulfa Antibiotics Nausea And Vomiting, Other (See Comments) and Rash     Other Reaction(s): GI Intolerance, Other- (not listed) - Allergy, Unknown    Unknown    Sulfasalazine Rash    Sulfur  Itching and Nausea Only      Immunization History   Administered Date(s) Administered    COVID-19, MODERNA BLUE border, Primary or Immunocompromised, (age 12y+), IM, 100 mcg/0.67mL 06/05/2019, 07/03/2019, 03/30/2020    COVID-19, MODERNA Bivalent, (age 12y+), IM, 50 mcg/0.5 mL 03/29/2021    Influenza A (H1n1-09),all Formulations 05/23/2008    Influenza Virus Vaccine 12/28/1999, 03/31/2001, 03/23/2002, 03/06/2004, 03/23/2005, 03/13/2006, 02/21/2007, 01/18/2008, 02/20/2009, 02/12/2010, 03/24/2011, 03/23/2012, 02/23/2013, 04/07/2015, 02/24/2017, 02/21/2019    Influenza, FLUAD, (age 61 y+), IM, Quadv, 0.42mL 02/24/2020, 03/15/2021    Influenza, FLUAD, (age 73 y+), IM, Trivalent PF, 0.5mL 03/07/2018    Influenza, FLUZONE High Dose, (age 20 y+), IM, Trivalent PF, 0.51mL 02/20/2014, 03/24/2015, 03/26/2016, 01/21/2017    Pneumococcal Vaccine 05/23/1997, 12/28/1999, 11/30/2011    Pneumococcal, PCV-13, PREVNAR 13, (age 6w+), IM, 0.49mL 11/30/2011, 05/30/2014    Pneumococcal, PPSV23, PNEUMOVAX 23, (age 2y+), SC/IM, 0.25mL 06/01/2016, 06/01/2016    TDaP, ADACEL (age 33y-64y), BOOSTRIX (age 10y+), IM, 0.5mL 05/29/2013, 03/20/2017    Td vaccine (adult) 01/15/2003    Tetanus 01/15/2003    Zoster Live (Zostavax) 12/22/2006      Current Outpatient Medications   Medication Sig Dispense Refill    torsemide (DEMADEX) 20 MG tablet Take 1 tablet by mouth daily      furosemide  (LASIX ) 20 MG tablet Take  1 tablet by mouth daily 60 tablet 3    sodium chloride , Inhalant, 3 % nebulizer solution Take 4 mLs by nebulization 2 times daily 240 mL 1    apixaban  (ELIQUIS ) 2.5 MG TABS tablet Take 1 tablet by mouth 2 times daily 60 tablet 0    carvedilol  (COREG ) 6.25 MG tablet Take 1 tablet by mouth 2 times daily (with meals) 60 tablet 0    ascorbic  acid (VITAMIN C) 500 MG tablet Take 1 tablet by mouth daily      lactobacillus (CULTURELLE) CAPS capsule Take 1 capsule by mouth daily      lidocaine  (LIDODERM ) 5 % Place 1 patch onto the skin daily      Multiple Vitamin (MULTIVITAMIN) tablet Take 1 tablet by mouth daily      pantoprazole  (PROTONIX ) 40 MG tablet Take 1 tablet by mouth daily      prednisoLONE  acetate (PRED FORTE ) 1 % ophthalmic suspension 1 drop 2 times daily      rosuvastatin  (CRESTOR ) 10 MG tablet 3 tablets daily      tiotropium-olodaterol (STIOLTO) 2.5-2.5 MCG/ACT AERS Inhale 2 puffs into the lungs daily 1 each 11    albuterol  sulfate HFA (PROVENTIL ;VENTOLIN ;PROAIR ) 108 (90 Base) MCG/ACT inhaler Inhale 2 puffs into the lungs every 4 hours as needed for Shortness of Breath 1 each 11    aspirin  81 MG chewable tablet Take 1 tablet by mouth       No current facility-administered medications for this visit.      Past Medical History:   Diagnosis Date    Arthritis     Asthma     Chronic bronchitis (HCC)     Hypertension     Lung cancer [facility])       Past Surgical History:   Procedure Laterality Date    UPPER GASTROINTESTINAL ENDOSCOPY N/A 07/18/2023    ESOPHAGOGASTRODUODENOSCOPY BIOPSY/balloon dilation performed by Emelia Okey Barter, MD at [facility] ENDOSCOPY    History reviewed. No pertinent family history.   Social History     Tobacco Use    Smoking status: Former     Current packs/day: 0.00     Average packs/day: 1 pack/day for 34.0 years (34.0 ttl pk-yrs)     Types: Cigarettes     Start date: 05/31/1954     Quit date: 1990     Years since quitting: 35.6    Smokeless tobacco: Never    Tobacco comments:     Smoked a pack a day for about 46 years.  Quit about 2001   Vaping Use    Vaping status: Never Used   Substance Use Topics    Alcohol use: Never    Drug use: Never       OBJECTIVE:  Physical Exam:  VITALS  BP 116/73 (BP Site: Right Upper Arm, Patient Position: Sitting)   Pulse 79   Temp 97.2 F (36.2 C) (Infrared)   Resp 19   Ht 1.676 m (5' 6)   Wt 78.5  kg (173 lb)   SpO2 98% Comment: RA  BMI 27.92 kg/m       Constitutional:  the patient is well developed and in no acute distress  EENMT:  Sclera clear, pupils equal, oral mucosa moist, narrow oral airway, nares patent  Respiratory: CTA b/l. No Wheezing or Rhonchi.  Cardiovascular:  RRR without M,G,R  Gastrointestinal: soft and non-tender; with positive bowel sounds.  Musculoskeletal: warm without cyanosis. There is +3 bilateral lower leg edema.  Skin:  no jaundice  or rashes, no visible wounds   Neurologic: no gross neuro deficits     Psychiatric:  alert and oriented x 3            ASSESSMENT/PLAN:  (Medical Decision Making)       ICD-10-CM    1. OSA (obstructive sleep apnea)  G47.33 -I do not have a copy of his prior sleep study but the patient is a [facility] patient and they may.  Will send a request for supplies and they can send us  a copy of his prior polysomnogram.      2. Hypoxemia  R09.02 -Will eventually need overnight oximetry      3. Difficulty using continuous positive airway pressure (CPAP) full face mask  Z78.9 -Patient currently using CPAP but the mask is the wrong size.  I did give him an of Evora fullface mask size large and I fitted for his face and it seems to be working.  Will have to reassess             SUMMARY:    -Patient is compliant and benefiting with PAP therapy, but need to see if we can improve his AHI.  Hopefully changing mask to help  -Renew supplies, but order him Evora fullface mask size large.  -Recheck AHI and compliance next visit otherwise may need a dedicated CPAP titration    -Try to see if we can get his prior polysomnogram      Sleep hygiene was discussed with the patient today.    I told him what to do to get his compression stockings on in the a.m.  Will see how he does next visit    Did review Epworth score, Compliance date, PSG and Titration study today.     All questions are answered to the patient's satisfaction. The patient will call for further questions and  concerns.    Follow up at the [facility] will be in 2 months with any advance care provider  Follow up will be with the patient's referring physician as had been previously scheduled.    Teniqua Marron Twilla Amy, MD    Over 50% of today's office visit was spent in face to face time reviewing test results, prognosis, importance of compliance, education about disease process, benefits of medications, instructions for management of acute flare-ups, and follow up plans.  .    Electronically signed and dictated.  Please note if there are errors this is  likely a result of the dictation software.    No orders of the defined types were placed in this encounter.     No orders of the defined types were placed in this encounter.

## 2023-12-29 NOTE — Addendum Note (Signed)
 Addended by: Trinisha Paget on: 12/29/2023 11:23 AM     Modules accepted: Orders

## 2024-01-03 ENCOUNTER — Encounter: Payer: 59 | Attending: Physician Assistant | Primary: Diagnostic Radiology

## 2024-01-23 NOTE — Telephone Encounter (Signed)
 Patient requesting an order be put into the [facility] for the mask he got at his last visit.  Evora Full by [employer] [facility]    He likes that mask better than what he had been using. Please give patient a call when order for masks are placed

## 2024-01-29 NOTE — Telephone Encounter (Signed)
 I have called to update the patient and he is [facility] and will update him if anything else is needed. He verbalized understanding and had no further questions or concerns.

## 2024-02-08 ENCOUNTER — Ambulatory Visit: Admit: 2024-02-08 | Discharge: 2024-02-08 | Payer: 59 | Attending: Nurse Practitioner | Primary: Diagnostic Radiology

## 2024-02-08 ENCOUNTER — Ambulatory Visit: Admit: 2024-02-08 | Discharge: 2024-02-08 | Payer: 59 | Primary: Diagnostic Radiology

## 2024-02-08 VITALS — BP 112/66 | HR 71 | Temp 97.80000°F | Resp 18 | Ht 66.0 in | Wt 176.3 lb

## 2024-02-08 DIAGNOSIS — J90 Pleural effusion, not elsewhere classified: Principal | ICD-10-CM

## 2024-02-08 DIAGNOSIS — J449 Chronic obstructive pulmonary disease, unspecified: Principal | ICD-10-CM

## 2024-02-08 MED ORDER — TIOTROPIUM BROMIDE-OLODATEROL 2.5-2.5 MCG/ACT IN AERS
2.5-2.5 | Freq: Every day | RESPIRATORY_TRACT | 3 refills | 6.00000 days | Status: AC
Start: 2024-02-08 — End: ?

## 2024-02-08 MED ORDER — FUROSEMIDE 20 MG PO TABS
20 | ORAL_TABLET | Freq: Every day | ORAL | 3 refills | 30.00000 days | Status: AC
Start: 2024-02-08 — End: ?

## 2024-02-08 NOTE — Progress Notes (Signed)
 [facility]      [phone]        Name:  Paul Medina  Date of Birth:  05-24-1930   MRN: [id number]      Office Visit: 02/08/2024        [facility]     The patient (or guardian, if applicable) and other individuals in attendance with the patient were advised that Artificial Intelligence will be utilized during this visit to record, process the conversation to generate a clinical note, and support improvement of the AI technology. The patient (or guardian, if applicable) and other individuals in attendance at the appointment consented to the use of AI, including the recording.         Assessment & Plan (Medical Decision Making)      Impression: 89 y.o. male     Assessment & Plan  1. Chronic Obstructive Pulmonary Disease (COPD).  The patient has a 34-pack-year history of cigarette smoking but quit smoking in 1990. Previous office spirometry conducted in 2024 revealed moderate obstruction. He reports worsening shortness of breath over the past few months, which is worse than he has ever experienced. He uses an inhaler but still gets out of breath easily and uses a walker. A CT scan ordered by Dr. Cora showed pneumonia improving but not completely resolved, and a small amount of fluid around both lungs, more on the right side. A chest x-ray will be conducted today to assess the extent of the fluid accumulation.  - tiotropium-olodaterol (STIOLTO) 2.5-2.5 MCG/ACT AERS; Inhale 2 puffs into the lungs daily  Dispense: 3 each; Refill: 3    2. Bilateral pleural effusion  Improved overall but still present on left.  Has worsening DOE.  Has been on Lasix  40 mg daily.  Will increase to 60 mg qd x 1 week, and then back to 40 mg daily.  - XR CHEST PA LAT (2 VIEWS); Future    3. Pulmonary infiltrate  Bilateral infiltrates--improved on current CXR.          Orders Placed This Encounter   Medications    tiotropium-olodaterol (STIOLTO) 2.5-2.5 MCG/ACT AERS     Sig: Inhale 2 puffs into the lungs daily      Dispense:  3 each     Refill:  3    furosemide  (LASIX ) 20 MG tablet     Sig: Take 2 tablets by mouth daily     Dispense:  60 tablet     Refill:  3     No orders of the defined types were placed in this encounter.    Follow-up and Dispositions    Return in about 8 weeks (around 04/04/2024) for Wing Schoch, COPD, pleural effusion, cxr, FI office.       Collaborating physician is Dr. Celine Southern.    ADS    Dorthea CHRISTELLA Lofty, APRN - CNP      _________________________________________________________________________    HISTORY OF PRESENT ILLNESS:    Mr. Paul Medina is a 89 y.o. male who is seen at [facility] today for  COPD     History of Present Illness  The patient is a 89 year old male who presents for follow-up of COPD. [facility] [place] . He has a 34-pack-year history of cigarette smoking but quit smoking in 1990. Previous office spirometry done in 2024 revealed moderate obstruction.    He reports experiencing breathing difficulties over the past few months, which he manages with an inhaler. Despite this,  he still experiences shortness of breath, necessitating the use of a walker. He also mentions that his shortness of breath has worsened recently.     The patient was hospitalized in 05/2023 due to COVID-19 and pneumonia, which required a stay of 2 to 3 weeks. He was discharged but had to be readmitted to another hospital for a couple of weeks. During his stay at his daughter's house, he was encouraged to eat and walk but preferred to sleep. This led to another hospital visit where he received a blood transfusion due to anemia. A follow-up CT scan of his chest showed that the pneumonia was improving but not completely resolved, and there was a small amount of fluid around both lungs, more so on the right than on the left. He has a history of B-cell lymphoma, diagnosed 2 years ago, for which he underwent chemotherapy.    He has been experiencing weakness and  shortness of breath, which he attributes to his anemia. He is currently under the care of a cardiologist.    He has a known esophageal issue and avoids certain foods like dry chicken or pork. He has experienced choking on food or liquids due to his esophageal condition.    The patient has been diagnosed with gout and avoids seafood, shellfish, red meat, and processed meat as part of his diet. He has been experiencing swelling in his ankles and feet for several years, which he manages with compression stockings.    SOCIAL HISTORY  Diet: Avoids seafood, shellfish, red meat, and processed meat due to gout.  Tobacco: 34-pack-year history of cigarette smoking but quit smoking in 1990.      REVIEW OF SYSTEMS: 10 point review of systems is negative except as reported in HPI.    PHYSICAL EXAM: Body mass index is 28.46 kg/m.  Vitals:    02/08/24 1032   BP: 112/66   Pulse: 71   Resp: 18   Temp: 97.8 F (36.6 C)   TempSrc: Temporal   SpO2: 99%   Weight: 80 kg (176 lb 4.8 oz)   Height: 1.676 m (5' 6)         General:   Alert, cooperative, no distress, appears stated age.        Eyes:   Conjunctivae/corneas clear. PERRL        Mouth/Throat:  Lips, mucosa, and tongue normal. Teeth and gums normal.        Lungs:     Breath sounds decreased bilaterally, L>R, but clear.     Heart:   Regular rate and rhythm, S1, S2 normal, no murmur, click, rub or gallop.     Abdomen:    Soft, non-tender.     Extremities:  Extremities normal, atraumatic, no cyanosis or edema.     Skin:  Skin color normal. No rashes or lesions     Neurologic:  A&Ox3     DIAGNOSTIC TESTS:                                                                                    LABS:   Lab Results   Component Value Date/Time    WBC 6.2 07/20/2023 06:25 AM  HGB 8.1 07/20/2023 06:25 AM    HCT 23.4 07/20/2023 06:25 AM    PLT 122 07/20/2023 06:25 AM    CRP 6.8 07/09/2023 11:41 AM     Imaging: I performed an independent interpretation of the patient's images.  CXR:   XR HAND  RIGHT (MIN 3 VIEWS) 11/08/2023    Narrative  EXAM: XR HAND 3+ VW RIGHT, 11/08/2023 2:33 PM    INDICATION: M79.641 Pain in right hand I10;    Comparison: None    AP oblique and lateral views right hand demonstrate degenerative changes at several interphalangeal joints suggesting arthritic change.    There is joint space narrowing at the right first metacarpal phalangeal joint and right first carpometacarpal junction. Metacarpals show no fracture.    There is crowding of the carpal bones with probable degenerative changes at the radiocarpal joint and negative ulnar variance.    Lateral view shows degenerative changes at the right first carpometacarpal junction. There is prominent crowding of the proximal and distal carpal row with findings suggestive of degenerative change.    Soft tissue thickening/swelling is seen at the dorsal aspect of the hand and wrist region.    CONCLUSION:    1. Arthritic changes are seen at the interphalangeal joints and metacarpal phalangeal joints of the right hand.    2. Chronic appearing arthritic changes are seen at the carpal bones of the right hand with crowding/partial collapse of the bony elements and some hypertrophic changes. This limits evaluation for potential fracture. If there is concern for acute fracture in the right wrist, CT or MRI imaging may be helpful.    3. Negative ulnar variance. Degenerative changes radiocarpal joint.    4. Generalized soft tissue thickening at the level of the right wrist and dorsal aspect of the hand.    Signed by: 11/08/2023 2:43 PM: Zackary MD, HILARIO Rigg    CXR 02/08/24  Formal report pending--improving bilateral effusions        CT Chest:   CT CHEST ABDOMEN PELVIS WO CONTRAST 12/12/2023    Narrative  EXAM: CT CHEST - ABDOMEN - PELVIS NON-CONTRAST.    HISTORY: C83.30 Diffuse large B-cell lymphoma, unspecified site (HCC) I10;;DLBCL    COMPARISON:  1.  March 17 and November 2024    TECHNIQUE: Volume acquisition of the chest, abdomen and pelvis was  obtained from the thoracic inlet to the symphysis pubis without contrast. Oral contrast was not administered. Data was reconstructed at  4  mm. Axial sagittal coronal images were constructed and reviewed.    FINDINGS:  Lungs - Pleura:    1. There is multifocal areas of groundglass opacity and airspace consolidation present bilaterally. There has been interval improvement in comparison to prior study with decrease in the size of the airspace consolidations in comparison to prior exam of March 17. The appearance suggests multifocal pneumonia. Continued follow-up is recommended to exclude an underlying malignancy.  2. Small bilateral pleural effusions. Left pleural effusion has decreased in size. The right pleural effusion appears to be new.  3. Bronchial wall thickening is noted bilaterally particularly in the lower lobes similar to prior studies.    Hila - Mediastinum:  No significant mediastinal adenopathy. No obvious hilar adenopathy in this noncontrast exam.    Heart - Vascular:  Heart size is upper limits of normal but stable from prior exam. Coronary calcifications are noted.    Liver:  Noncontrast images of the liver demonstrates no focal lesions    Gallbladder - Biliary Tree:  Gallbladder is contracted. No biliary dilatation    Pancreas:  Stable 1.6 cm low-attenuation lesion seen near the body of the pancreas. This is likely cystic in nature possibly a IPMN. This could be further evaluated with MRI.    Spleen:  No focal lesions in the spleen    Retroperitoneum:  Adrenals: Stable left adrenal nodule measuring 1.6 cm. Right adrenal gland is normal.  Kidneys:  Lobular scarred appearance to the kidneys. No hydronephrosis.  Aorto - Cava:  Normal caliber. No aneurysm. Vascular calcifications.  Lymphatics:   No adenopathy.    GI - Mesentery - Peritoneum:  Stomach is mildly distended with food-like material. Duodenum and small bowel demonstrates no thickening or dilatation. No obstruction. There is diverticulosis of  the descending colon and sigmoid colon without evidence for diverticulitis.    Pelvis:  Bladder is unremarkable. No pelvic masses or free fluid.    Soft tissues - MSK:  Degenerative changes and scoliosis seen in the spine.    Impression  1. Multiple focal areas of groundglass and opacities and airspace consolidation are again noted improved from prior study. The appearance suggests multifocal pneumonia. Continued follow-up is recommended to exclude an underlying malignancy.  2. Small bilateral pleural effusions.  3. Stable 1.6 cm low-attenuation lesion seen in the body of pancreas. This is likely cystic in nature possibly an IPMN. This could be further evaluated with MRI.  4. Stable 1.6 cm left adrenal nodule.  5. Diverticulosis of the descending and sigmoid colon. No evidence for diverticulitis    Signed by: 12/12/2023 3:03 PM: Jama MD, Leatrice    Nuclear Medicine: No results found for this or any previous visit from the past 3650 days.    PFTs:       Latest Ref Rng & Units 05/11/2023    12:00 AM   Office Spirometry Results   FVC L 2.33    FEV1 L 1.39    FEV1 %Pred-Pre % 62    FVC %Pred-Pre % 75    FEV1/FVC % 60      No results found for this or any previous visit. No results found for this or any previous visit.    FeNO: No results found for this or any previous visit.  FeNO and Likelihood of Eosinophilic Asthma   Unlikely Intermediate Likely   <25 ppb 25-50 ppb >50ppb     Exercise Oximetry:    Echo:   ECHO (TTE) COMPLETE (PRN CONTRAST/BUBBLE/STRAIN/3D) 06/29/2023    Interpretation Summary    Left Ventricle: Moderately reduced left ventricular systolic function with a visually estimated EF of 30 - 35%. Left ventricle is moderately dilated. Normal wall thickness. Grade II diastolic dysfunction with increased LAP.    Aortic Valve: Mild regurgitation. Mild stenosis of the aortic valve (DI ~0.44).    Mitral Valve: Mild to moderate regurgitation with a posterior directed jet.    Left Atrium: Left atrium is mildly  dilated.    Image quality is adequate. Contrast used: Lumason .    PMH Reference Info:  Immunization History   Administered Date(s) Administered    COVID-19, Inactive, MODERNA BLUE border, Primary or Immunocompromised, (age 12y+) 06/05/2019, 07/03/2019, 03/30/2020    COVID-19, Inactive, MODERNA Bivalent, (age 12y+) 03/29/2021    COVID-19, SPIKEVAX Laren), (age 12y+), IM, 65mcg/0.5mL 02/15/2023    Influenza A (H1n1-09),all Formulations 05/23/2008    Influenza Virus Vaccine 12/28/1999, 03/31/2001, 03/23/2002, 03/06/2004, 03/23/2005, 03/13/2006, 02/21/2007, 01/18/2008, 02/20/2009, 02/12/2010, 03/24/2011, 03/23/2012, 02/23/2013, 04/07/2015, 02/24/2017, 02/21/2019    Influenza, FLUAD, (age 8 y+), IM, Quadv, 0.74mL 02/24/2020, 03/15/2021    Influenza, FLUAD, (age 67 y+), IM, Trivalent PF, 0.5mL 03/07/2018    Influenza, FLUZONE High Dose, (age 19 y+), IM, Trivalent PF, 0.71mL 02/20/2014, 03/24/2015, 03/26/2016, 01/21/2017    Pneumococcal Vaccine 05/23/1997, 12/28/1999, 11/30/2011    Pneumococcal, PCV-13, PREVNAR 13, (age 6w+), IM, 0.12mL 11/30/2011, 05/30/2014    Pneumococcal, PPSV23, PNEUMOVAX 23, (age 2y+), SC/IM, 0.49mL 06/01/2016, 06/01/2016    TDaP, ADACEL (age 66y-64y), BOOSTRIX (age 10y+), IM, 0.59mL 05/29/2013, 03/20/2017    Td vaccine (adult) 01/15/2003    Tetanus 01/15/2003    Zoster Live (Zostavax) 12/22/2006     Past Medical History:   Diagnosis Date    Arthritis     Asthma     Chronic bronchitis (HCC)     Hypertension     Lung cancer (HCC)         Tobacco Use      Smoking status: Former        Packs/day: 0.00        Years: 1 pack/day for 34.0 years (34.0 ttl pk-yrs)        Types: Cigarettes        Start date: 05/31/1954        Quit date: 1990        Years since quitting: 35.7      Smokeless tobacco: Never      Tobacco comments: Smoked a pack a day for about 46 years.  Quit about 2001    Allergies    Allergen Reactions    Ciprofloxacin Other (See Comments)     Other Reaction(s): Joint swelling-Intolerance    Doxycycline  Nausea And Vomiting and Nausea Only     Other Reaction(s): GI Intolerance    Lisinopril Cough and Other (See Comments)     Other Reaction(s): Cough, Cough-Intolerance, Other (See Comments)    cough    cough   cough   cough    Other Other (See Comments) and Nausea And Vomiting     Causes sneezing, itchy/runny nose    Other Reaction(s): GI Intolerance    unknown    unknown, unknown    unknown   unknown    Causes sneezing, itchy/runny nose   Causes sneezing, itchy/runny nose   Causes sneezing, itchy/runny nose    Sulfa Antibiotics Nausea And Vomiting, Other (See Comments) and Rash     Other Reaction(s): GI Intolerance, Other- (not listed) - Allergy, Unknown    Unknown    Sulfasalazine Rash    Sulfur  Itching and Nausea Only     Current Outpatient Medications   Medication Instructions    albuterol  sulfate HFA (PROVENTIL ;VENTOLIN ;PROAIR ) 108 (90 Base) MCG/ACT inhaler 2 puffs, Inhalation, EVERY 4 HOURS PRN    allopurinol (ZYLOPRIM) 300 mg    apixaban  (ELIQUIS ) 2.5 mg, Oral, 2 TIMES DAILY    ascorbic acid (VITAMIN C) 500 mg, DAILY    aspirin  81 mg    atropine  1 % ophthalmic solution 1 drop, 2 TIMES DAILY    brimonidine  (ALPHAGAN  P) 0.15 %  ophthalmic solution 1 drop, 3 TIMES DAILY    carboxymethylcellulose PF (REFRESH PLUS) 0.5 % SOLN ophthalmic solution 1 drop, 4 TIMES DAILY PRN    carvedilol  (COREG ) 6.25 mg, Oral, 2 TIMES DAILY WITH MEALS    furosemide  (LASIX ) 40 mg, Oral, DAILY    lactobacillus (CULTURELLE) CAPS capsule 1 capsule, DAILY    latanoprost (XALATAN) 0.005 % ophthalmic solution 1 drop    lidocaine  (LIDODERM ) 5 % 1 patch, DAILY    metoprolol  succinate (TOPROL  XL) 25 MG extended release tablet Take by mouth    Multiple Vitamin (MULTIVITAMIN) tablet 1 tablet, DAILY    pantoprazole  (PROTONIX ) 40 mg, DAILY    prednisoLONE  acetate (PRED FORTE ) 1 % ophthalmic suspension 1 drop, 2 TIMES DAILY     rosuvastatin  (CRESTOR ) 30 mg, DAILY    sodium chloride , Inhalant, 3 % nebulizer solution 4 mLs, Nebulization, 2 TIMES DAILY    tiotropium-olodaterol (STIOLTO) 2.5-2.5 MCG/ACT AERS 2 puffs, Inhalation, DAILY    torsemide (DEMADEX) 20 mg, DAILY    Vitamin D-1000 Max St 1,000 Units, DAILY

## 2024-02-08 NOTE — Patient Instructions (Signed)
 Increase Lasix  to 60 mg daily x 1 week and then back to 40 mg daily.

## 2024-03-21 ENCOUNTER — Ambulatory Visit
Admit: 2024-03-21 | Discharge: 2024-03-21 | Payer: 59 | Attending: Critical Care Medicine | Primary: Diagnostic Radiology

## 2024-03-21 VITALS — BP 105/63 | HR 78 | Temp 97.00000°F | Resp 19 | Ht 66.0 in | Wt 177.0 lb

## 2024-03-21 DIAGNOSIS — G4733 Obstructive sleep apnea (adult) (pediatric): Principal | ICD-10-CM

## 2024-03-21 NOTE — Progress Notes (Signed)
 "[facility]: Follow-up visit  [address]  [place], [place] [address]  [phone]    Patient Name:  Paul Medina    Date of Birth:  [birth year]            Date of Service:  03/21/2024    Chief Complaint   Patient presents with    Follow-up    Sleep Apnea       History of Present Illness:  This pleasant gentleman came came in today follow-up visit regarding sleep apnea.    To recap:  [facility], who has been on PAP therapy for years.  Foresee not able to get his prior study but he has been compliant with PAP therapy but his AHI has been elevated at 21.9 during the last visit with marked air leaks.  Last visit I did give him an Evora fullface mask to see if that would help.  His Epworth score was elevated 12/24 last time and he did feel refreshed 5 or 6 days out of the week.    At this time:  Still have not received the prior study from the [facility], but compliance data shows he is using a 3 of 84 days and 81 days more than 4 hours he is sleeping about 7 hours and 50 minutes.  His AHI is down to 14.7.  95th percentile pressure is 11.2 but air leaks are 112 L/min.  He is not really reporting any difficulty with the mask, actually states he actually feels like he is sleeping a little bit better.    His Epworth score is actually improved down to 5/24.            03/14/2024    11:43 AM 12/25/2023    10:08 AM   Sleep Medicine   Sitting and reading 1 2   Watching TV 1 3   Sitting, inactive in a public place (e.g. a theatre or a meeting) 1 2   As a passenger in a car for an hour without a break 1 1   Lying down to rest in the afternoon when circumstances permit 1 3   Sitting and talking to someone 0 0   Sitting quietly after a lunch without alcohol 0 1   In a car, while stopped for a few minutes in traffic 0 0   Epworth Sleepiness Score 5  12       Patient-reported     Download:       DIAGNOSTIC TESTS/RESULTS           No data to display                Past Medical History:   Diagnosis Date     Arthritis     Asthma     Chronic bronchitis (HCC)     Hypertension     Lung cancer [facility])          Patient Active Problem List   Diagnosis    Primary hypertension    HLD (hyperlipidemia)    Coronary atherosclerosis of native coronary artery    History of TIA (transient ischemic attack)    Amaurosis fugax of left eye    OSA (obstructive sleep apnea)    Atrial fibrillation with rapid ventricular response (HCC)    Stage 3 chronic kidney disease (HCC)    COVID    Moderate asthma with exacerbation    Chronic combined systolic and diastolic heart failure (HCC)    COVID-19  virus infection    Pancytopenia (HCC)    Severe sepsis (HCC)    Paroxysmal atrial fibrillation (HCC)    Aspiration pneumonia of both lungs (HCC)    Acute respiratory failure with hypoxia (HCC)    Aspiration pneumonia (HCC)    History of B-cell lymphoma    DNR (do not resuscitate)    Oropharyngeal dysphagia    Dysphagia    Pneumonia of right lung due to infectious organism    Ineffective airway clearance          Past Surgical History:   Procedure Laterality Date    UPPER GASTROINTESTINAL ENDOSCOPY N/A 07/18/2023    ESOPHAGOGASTRODUODENOSCOPY BIOPSY/balloon dilation performed by Emelia Okey Barter, MD at [facility] ENDOSCOPY         Social History     Socioeconomic History    Marital status: Single     Spouse name: Not on file    Number of children: 2    Years of education: Not on file    Highest education level: Not on file   Occupational History    Occupation: Lives in the retirement community   Tobacco Use    Smoking status: Former     Current packs/day: 0.00     Average packs/day: 1 pack/day for 34.0 years (34.0 ttl pk-yrs)     Types: Cigarettes     Start date: 05/31/1954     Quit date: 1990     Years since quitting: 35.8    Smokeless tobacco: Never    Tobacco comments:     Smoked a pack a day for about 46 years.  Quit about 2001   Vaping Use    Vaping status: Never Used   Substance and Sexual Activity    Alcohol use: Never    Drug use: Never    Sexual activity:  Not Currently     Partners: Female   Other Topics Concern    Not on file   Social History Narrative    Has no pets.    We really drinks any caffeine     Social Drivers of Psychologist, [employer] Strain: Low Risk  (10/21/2022)    Received from [facility]    Overall Financial Resource Strain (CARDIA)     Difficulty of Paying Living Expenses: Not very hard   Food Insecurity: No Food Insecurity (07/04/2023)    Hunger Vital Sign     Worried About Running Out of Food in the Last Year: Never true     Ran Out of Food in the Last Year: Never true   Transportation Needs: No Transportation Needs (07/04/2023)    PRAPARE - Therapist, Art (Medical): No     Lack of Transportation (Non-Medical): No   Physical Activity: Unknown (10/21/2022)    Received from [facility]    Exercise Vital Sign     On average, how many days per week do you engage in moderate to strenuous exercise (like a brisk walk)?: 0 days     Minutes of Exercise per Session: Not on file   Stress: Stress Concern Present (10/21/2022)    Received from [facility] - Occupational Stress Questionnaire     Feeling of Stress : To some extent   Social Connections: Unknown (02/23/2023)    Received from [facility]    Social Connections     Frequency of Communication with Friends and Family: Not asked  Frequency of Social Gatherings with Friends and Family: Not asked   Intimate Partner Violence: Unknown (02/23/2023)    Received from [facility]    Intimate Partner Violence     Fear of Current or Ex-Partner: Not asked     Emotionally Abused: Not asked     Physically Abused: Not asked     Sexually Abused: Not asked   Housing Stability: Low Risk  (07/04/2023)    Housing Stability Vital Sign     Unable to Pay for Housing in the Last Year: No     Number of Times Moved in the Last Year: 1     Homeless in the Last Year: No            History reviewed. No pertinent family history.      Allergies    Allergen Reactions    Ciprofloxacin Other (See Comments)     Other Reaction(s): Joint swelling-Intolerance    Doxycycline  Nausea And Vomiting and Nausea Only     Other Reaction(s): GI Intolerance    Lisinopril Cough and Other (See Comments)     Other Reaction(s): Cough, Cough-Intolerance, Other (See Comments)    cough    cough   cough   cough    Other Other (See Comments) and Nausea And Vomiting     Causes sneezing, itchy/runny nose    Other Reaction(s): GI Intolerance    unknown    unknown, unknown    unknown   unknown    Causes sneezing, itchy/runny nose   Causes sneezing, itchy/runny nose   Causes sneezing, itchy/runny nose    Sulfa Antibiotics Nausea And Vomiting, Other (See Comments) and Rash     Other Reaction(s): GI Intolerance, Other- (not listed) - Allergy, Unknown    Unknown    Sulfasalazine Rash    Sulfur  Itching and Nausea Only         Current Outpatient Medications   Medication Sig    allopurinol (ZYLOPRIM) 300 MG tablet Take 1 tablet by mouth    atropine  1 % ophthalmic solution Apply 1 drop to eye 2 times daily    brimonidine  (ALPHAGAN  P) 0.15 % ophthalmic solution Apply 1 drop to eye 3 times daily    carboxymethylcellulose PF (REFRESH PLUS) 0.5 % SOLN ophthalmic solution Apply 1 drop to eye 4 times daily as needed    vitamin D (VITAMIN D-1000 MAX ST) 25 MCG (1000 UT) TABS tablet Take 1 tablet by mouth daily    metoprolol  succinate (TOPROL  XL) 25 MG extended release tablet Take by mouth    latanoprost (XALATAN) 0.005 % ophthalmic solution Apply 1 drop to eye    tiotropium-olodaterol (STIOLTO) 2.5-2.5 MCG/ACT AERS Inhale 2 puffs into the lungs daily    furosemide  (LASIX ) 20 MG tablet Take 2 tablets by mouth daily    ascorbic acid (VITAMIN C) 500 MG tablet Take 1 tablet by mouth daily    lactobacillus (CULTURELLE) CAPS capsule Take 1 capsule by mouth daily    lidocaine  (LIDODERM ) 5 % Place 1 patch onto the skin daily    Multiple Vitamin (MULTIVITAMIN) tablet Take 1 tablet by mouth daily    prednisoLONE   acetate (PRED FORTE ) 1 % ophthalmic suspension 1 drop 2 times daily    rosuvastatin  (CRESTOR ) 10 MG tablet 3 tablets daily    albuterol  sulfate HFA (PROVENTIL ;VENTOLIN ;PROAIR ) 108 (90 Base) MCG/ACT inhaler Inhale 2 puffs into the lungs every 4 hours as needed for Shortness of Breath    apixaban  (ELIQUIS ) 2.5 MG TABS tablet  Take 1 tablet by mouth 2 times daily     No current facility-administered medications for this visit.         SUBJECTIVE:    REVIEW OF SYSTEMS:     CONSTITUTIONAL:   There is Negative history of fever, chills, night sweats.   Patient is  Negativefor weight loss, they are  Negative for  weight gain. Patient is  Negative  for fatigue and hypersomnia and is  on their CPAP.  Insomnia is   under control.      CARDIAC:   No chest pain, pressure, discomfort, palpitations, orthopnea, murmurs, or edema.     GI:   No dysphagia, heartburn reflux, nausea/vomiting, diarrhea, abdominal pain, or bleeding.     NEURO:    There is no history of AMS, persistent headache, decreased level of consciousness, seizures, or motor or sensory deficits.      Allergies   Allergen Reactions    Ciprofloxacin Other (See Comments)     Other Reaction(s): Joint swelling-Intolerance    Doxycycline  Nausea And Vomiting and Nausea Only     Other Reaction(s): GI Intolerance    Lisinopril Cough and Other (See Comments)     Other Reaction(s): Cough, Cough-Intolerance, Other (See Comments)    cough    cough   cough   cough    Other Other (See Comments) and Nausea And Vomiting     Causes sneezing, itchy/runny nose    Other Reaction(s): GI Intolerance    unknown    unknown, unknown    unknown   unknown    Causes sneezing, itchy/runny nose   Causes sneezing, itchy/runny nose   Causes sneezing, itchy/runny nose    Sulfa Antibiotics Nausea And Vomiting, Other (See Comments) and Rash     Other Reaction(s): GI Intolerance, Other- (not listed) - Allergy, Unknown    Unknown    Sulfasalazine Rash    Sulfur  Itching and Nausea Only      Immunization  History   Administered Date(s) Administered    COVID-19, Inactive, MODERNA BLUE border, Primary or Immunocompromised, (age 12y+) 06/05/2019, 07/03/2019, 03/30/2020    COVID-19, Inactive, MODERNA Bivalent, (age 12y+) 03/29/2021    COVID-19, SPIKEVAX Laren), (age 12y+), IM, 85mcg/0.5mL 02/15/2023    Influenza A (H1n1-09),all Formulations 05/23/2008    Influenza Virus Vaccine 12/28/1999, 03/31/2001, 03/23/2002, 03/06/2004, 03/23/2005, 03/13/2006, 02/21/2007, 01/18/2008, 02/20/2009, 02/12/2010, 03/24/2011, 03/23/2012, 02/23/2013, 04/07/2015, 02/24/2017, 02/21/2019    Influenza, FLUAD, (age 22 y+), IM, Quadv, 0.41mL 02/24/2020, 03/15/2021    Influenza, FLUAD, (age 9 y+), IM, Trivalent PF, 0.5mL 03/07/2018    Influenza, FLUZONE High Dose, (age 70 y+), IM, Trivalent PF, 0.38mL 02/20/2014, 03/24/2015, 03/26/2016, 01/21/2017    Pneumococcal Vaccine 05/23/1997, 12/28/1999, 11/30/2011    Pneumococcal, PCV-13, PREVNAR 13, (age 6w+), IM, 0.56mL 11/30/2011, 05/30/2014    Pneumococcal, PPSV23, PNEUMOVAX 23, (age 2y+), SC/IM, 0.23mL 06/01/2016, 06/01/2016    TDaP, ADACEL (age 53y-64y), BOOSTRIX (age 10y+), IM, 0.5mL 05/29/2013, 03/20/2017    Td vaccine (adult) 01/15/2003    Tetanus 01/15/2003    Zoster Live (Zostavax) 12/22/2006      Current Outpatient Medications   Medication Sig Dispense Refill    allopurinol (ZYLOPRIM) 300 MG tablet Take 1 tablet by mouth      atropine  1 % ophthalmic solution Apply 1 drop to eye 2 times daily      brimonidine  (ALPHAGAN  P) 0.15 % ophthalmic solution Apply 1 drop to eye 3 times daily      carboxymethylcellulose PF (REFRESH PLUS) 0.5 % SOLN ophthalmic  solution Apply 1 drop to eye 4 times daily as needed      vitamin D (VITAMIN D-1000 MAX ST) 25 MCG (1000 UT) TABS tablet Take 1 tablet by mouth daily      metoprolol  succinate (TOPROL  XL) 25 MG extended release tablet Take by mouth      latanoprost (XALATAN) 0.005 % ophthalmic solution Apply 1 drop to eye      tiotropium-olodaterol (STIOLTO) 2.5-2.5  MCG/ACT AERS Inhale 2 puffs into the lungs daily 3 each 3    furosemide  (LASIX ) 20 MG tablet Take 2 tablets by mouth daily 60 tablet 3    ascorbic acid (VITAMIN C) 500 MG tablet Take 1 tablet by mouth daily      lactobacillus (CULTURELLE) CAPS capsule Take 1 capsule by mouth daily      lidocaine  (LIDODERM ) 5 % Place 1 patch onto the skin daily      Multiple Vitamin (MULTIVITAMIN) tablet Take 1 tablet by mouth daily      prednisoLONE  acetate (PRED FORTE ) 1 % ophthalmic suspension 1 drop 2 times daily      rosuvastatin  (CRESTOR ) 10 MG tablet 3 tablets daily      albuterol  sulfate HFA (PROVENTIL ;VENTOLIN ;PROAIR ) 108 (90 Base) MCG/ACT inhaler Inhale 2 puffs into the lungs every 4 hours as needed for Shortness of Breath 1 each 11    apixaban  (ELIQUIS ) 2.5 MG TABS tablet Take 1 tablet by mouth 2 times daily 60 tablet 0     No current facility-administered medications for this visit.      Past Medical History:   Diagnosis Date    Arthritis     Asthma     Chronic bronchitis (HCC)     Hypertension     Lung cancer [facility])       Past Surgical History:   Procedure Laterality Date    UPPER GASTROINTESTINAL ENDOSCOPY N/A 07/18/2023    ESOPHAGOGASTRODUODENOSCOPY BIOPSY/balloon dilation performed by Emelia Okey Barter, MD at [facility] ENDOSCOPY    History reviewed. No pertinent family history.   Social History     Tobacco Use    Smoking status: Former     Current packs/day: 0.00     Average packs/day: 1 pack/day for 34.0 years (34.0 ttl pk-yrs)     Types: Cigarettes     Start date: 05/31/1954     Quit date: 1990     Years since quitting: 35.8    Smokeless tobacco: Never    Tobacco comments:     Smoked a pack a day for about 46 years.  Quit about 2001   Vaping Use    Vaping status: Never Used   Substance Use Topics    Alcohol use: Never    Drug use: Never       OBJECTIVE:  Physical Exam:  VITALS  BP 105/63 (BP Site: Right Upper Arm, Patient Position: Sitting)   Pulse 78   Temp 97 F (36.1 C) (Infrared)   Resp 19   Ht 1.676 m (5' 6)   Wt  80.3 kg (177 lb)   SpO2 95% Comment: RA  BMI 28.57 kg/m       Constitutional:  the patient is well developed and in no acute distress  EENMT:  Sclera clear, pupils equal, oral mucosa moist, narrow oral airway, nares patent  Respiratory: CTA b/l. No Wheezing or Rhonchi.  Cardiovascular:  RRR without M,G,R  Gastrointestinal: soft and non-tender; with positive bowel sounds.  Musculoskeletal: warm without cyanosis. There is +3 bilateral lower leg edema.  Skin:  no jaundice or rashes, no visible wounds   Neurologic: no gross neuro deficits     Psychiatric:  alert and oriented x 3            ASSESSMENT/PLAN:  (Medical Decision Making)       ICD-10-CM    1. OSA (obstructive sleep apnea)  G47.33 -I do not have a copy of his prior sleep study but the patient is a [facility] patient and they may.      -- AHI is elevated still, but getting better each time currently down to 14.7 from previously being 21.9 hopefully improving his mass should help him.      2. Hypoxemia  R09.02 -Will eventually need overnight oximetry      3. Difficulty using continuous positive airway pressure (CPAP) full face mask  Z78.9 - He did have his Evora mask with him and I did fix it and now he likes it little better.  Will have to see how he does next time.             SUMMARY:    -Patient is compliant and benefiting with PAP therapy, but need to see if we can improve his AHI-it has improved since last visit.  --I did fix his mask for him today  -He is compliant and feels he is benefiting.  - Renew supplies by [facility]    -Try to see if we can get his prior polysomnogram    -Continue proper sleep hygiene      Did review Epworth score and compliance date.    All questions are answered to the patient's satisfaction. The patient will call for further questions and concerns.    Follow up at the [facility] will be in 4-5 months with me or any advanced care provider.   Follow up will be with the patient's referring physician as had been  previously scheduled.    Raiza Kiesel Twilla Amy, MD    Over 50% of today's office visit was spent in face to face time reviewing test results, prognosis, importance of compliance, education about disease process, benefits of medications, instructions for management of acute flare-ups, and follow up plans.  .    Electronically signed and dictated.  Please note if there are errors this is  likely a result of the dictation software.    No orders of the defined types were placed in this encounter.     No orders of the defined types were placed in this encounter.    "

## 2024-03-29 NOTE — ED Provider Notes (Signed)
 "Encounter Note - Emergency Medicine  [facility] EMERGENCY DEPARTMENT  Encounter Date: 03/29/2024    History of Present Illness      Independent Historians: None    Patient is an elderly male with known history of COPD and heart failure who presents to the ED with acute shortness of breath. Symptoms began last night while using his CPAP and worsened this morning. Patient reports being awake since approximately 4:00 AM due to breathing difficulties. He denies chest pain. Patient reports chronic productive cough with sputum production that is 'almost all the time, ' though he is unsure if this has worsened recently. He reports chronic lower extremity edema and unable to tell me if current leg swelling is worse than his baseline. Patient attempted to use his inhaler this morning. He is on Lasix  for fluid management but has not taken any of his medications this morning but otherwise has not missed any medication doses. Patient has an upcoming cardiology appointment on Tuesday with [facility].    Physical Exam     - Vital signs: BP 116/61   Pulse 86   Temp 99.5 F (37.5 C) (Oral)   Resp 16   Ht 167.6 cm (66)   Wt 81.6 kg (180 lb)   SpO2 98%   BMI 29.05 kg/m   Physical Exam  Constitutional:       Appearance: He is well-developed.   Cardiovascular:      Rate and Rhythm: Normal rate and regular rhythm.   Pulmonary:      Breath sounds: Wheezing present.   Musculoskeletal:      Right lower leg: Edema present.      Left lower leg: Edema present.   Neurological:      Mental Status: He is alert.         Medical Decision Making     Review of prior external documentation:   Outpatient encounter note    Medical Decision Making:    1. Acute Shortness of Breath - Multifactorial:  - Likely due to combination of COPD exacerbation and acute on chronic heart failure  - Possible pneumonia component based on ultrasound findings  - Will initiate treatment addressing all potential causes    2. COPD  Exacerbation:  - Administer nebulized bronchodilator therapy  - Consider systemic corticosteroids  - Supplemental oxygen as needed to maintain appropriate saturation    3. Acute on Chronic Heart Failure:  - IV diuresis with furosemide   - Monitor fluid status and response to treatment  - Continue home heart failure medications as appropriate  - Cardiology follow-up remains important (patient has appointment Tuesday)    4. Possible Pneumonia:  - Obtain chest X-ray to confirm diagnosis  - Consider empiric antibiotic therapy based on clinical presentation and imaging  - Monitor for signs of respiratory deterioration    5. Disposition:  - dispo per workup and response to treatment  - Will reassess after initial interventions and laboratory results    Independent ECG Interpretation: I have independently reviewed and interpreted the ECG which showed see ed course.  Independent interpretation of Imaging: I have independently reviewed and interpreted the CXR which showed see ED course.  Discussion of management or test interpretation: None  Consideration of hospitalization: I considered admitting the patient for shortness of breath but opted not to in consideration of discussion with the patient and safe discharge plan.  Social Determinant of Health Impacting Care: None  Procedures:  Procedures    ED Course (Continued Decision  Making)  ED Course as of 03/29/24 1259   Fri Mar 29, 2024   0908 Independent interpretation EKG: Normal sinus rhythm at a rate of 75.  Normal PR QTc interval.  QRS is mildly prolonged at 105.  No ST elevation consistent with a STEMI.  T wave flattening in aVL only. [CM]   0953 Hemoglobin(!): 9.8  Better than baseline [CM]   1012 Troponin T(!!): 52  Below baseline [CM]   1111 XR Chest 1 Vw  Chest x-ray independently reviewed myself.  Appears to be left-sided pleural effusion [CM]   1111 XR Chest 1 Vw  IMPRESSION: Perihilar to basilar interstitial edema pattern with asymmetric confluence of mild edema  and atelectasis/small effusion in the left base. [CM]   1202 Patient reevaluated.  Wheezing has improved.  Is able to diurese as well close to a liter at this point.  Will ambulate patient to see how he does with his oxygen saturation as well as symptoms. [CM]   1251 Had shared decision-making with the patient about admission versus discharge I think given the fact that he has improved here and was able to ambulate with his walker without difficulty and without desatting dramatically he is safe for discharge.  Will give a prescription for nebulizer and nebulizer solution.  Told to take his Lasix  as prescribed.  He follows with [facility] and has an appointment on Tuesday.  Gave strict return precautions if symptoms worsen even despite home treatment he to return to the emergency department and he understood these instructions. [CM]      ED Course User Index  [CM] Iven Bart, MD         Clinical Impressions as of 03/29/24 1259   Dyspnea on exertion   COPD with acute exacerbation (HCC)   Acute on chronic systolic congestive heart failure (HCC)     Labs and Imaging Ordered:  Orders Placed This Encounter   Procedures    Critical Care    COVID/FLU/RSV 4-Plex PCR    XR Chest 1 Vw    Ultrasound - Point of Care    CBC w/Differential    Comprehensive Metabolic Panel (CMP)    Magnesium  (Mg)    Troponin T    NT-proBNP    Procalcitonin    Rainbow Draw    Dysrhythmia Monitoring - For ED and Procedural Areas ONLY    Continuous Pulse Oximetry    There is no organism currently documented requiring precautions. If organism or condition is suspected, keep isolation order    ECG 12 Lead (Once)     Medications Ordered During Visit:  Medications   albuterol  5 mg/mL continuous nebulizer solution (10 mg/hr Continuous Nebulization Given 03/29/24 1003)   ipratropium (ATROVENT ) 0.02 % nebulizer solution 0.5 mg (0.5 mg Aerosol Given 03/29/24 1006)   predniSONE  tablet 60 mg (60 mg Oral/Gastric Tube Given 03/29/24 0942)    furosemide  (LASIX ) injection 40 mg (40 mg Intravenous Given 03/29/24 0942)     New Medications Prescribed Upon Discharge:  New Prescriptions    ALBUTEROL  2.5 MG /3 ML (0.083 %) NEBULIZER SOLUTION    Take 3 mL (2.5 mg) by nebulization every 4 (four) hours as needed for wheezing    NEBULIZER AND COMPRESSOR    Use as instructed    PREDNISONE  20 MG TABLET    Take 2 tablets (40 mg) by mouth daily for 4 days    PREDNISONE  20 MG TABLET    Take 2 tablets (40 mg) by mouth daily for 5  days          Iven Bart, MD  03/29/24 1300    "

## 2024-04-08 NOTE — Telephone Encounter (Signed)
"  Spoke with patients daughter . Eve said she contact him and see why he called and let him know to call back    WT 04/08/24  "

## 2024-04-25 ENCOUNTER — Ambulatory Visit: Payer: 59 | Primary: Diagnostic Radiology

## 2024-04-25 ENCOUNTER — Encounter

## 2024-04-25 ENCOUNTER — Encounter: Payer: 59 | Attending: Nurse Practitioner | Primary: Diagnostic Radiology

## 2024-04-25 ENCOUNTER — Ambulatory Visit: Admit: 2024-04-25 | Discharge: 2024-04-25 | Payer: 59 | Primary: Diagnostic Radiology

## 2024-04-25 ENCOUNTER — Ambulatory Visit: Admit: 2024-04-25 | Discharge: 2024-04-25 | Payer: 59 | Attending: Nurse Practitioner | Primary: Diagnostic Radiology

## 2024-04-25 VITALS — BP 100/60 | HR 75 | Temp 98.40000°F | Resp 19 | Ht 66.0 in | Wt 175.4 lb

## 2024-04-25 DIAGNOSIS — J449 Chronic obstructive pulmonary disease, unspecified: Principal | ICD-10-CM

## 2024-04-25 MED ORDER — TIOTROPIUM BROMIDE 2.5 MCG/ACT IN AERS
2.5 | Freq: Every day | RESPIRATORY_TRACT | 3 refills | 25.00000 days | Status: AC
Start: 2024-04-25 — End: ?

## 2024-04-25 MED ORDER — IPRATROPIUM-ALBUTEROL 0.5-2.5 (3) MG/3ML IN SOLN
0.5-2.5 | Freq: Four times a day (QID) | RESPIRATORY_TRACT | 11 refills | 20.00000 days | Status: AC | PRN
Start: 2024-04-25 — End: ?

## 2024-04-25 MED ORDER — METOLAZONE 2.5 MG PO TABS
2.5 | ORAL_TABLET | ORAL | 0 refills | 60.00000 days | Status: AC
Start: 2024-04-25 — End: ?

## 2024-04-25 MED ORDER — FLUTICASONE-SALMETEROL 250-50 MCG/ACT IN AEPB
250-50 | Freq: Two times a day (BID) | RESPIRATORY_TRACT | 3 refills | 30.00000 days | Status: AC
Start: 2024-04-25 — End: ?

## 2024-04-25 NOTE — Progress Notes (Signed)
 [facility]      [phone]        Name:  Paul Medina  Date of Birth:  [birth year]   MRN: [id number]      Office Visit: 04/25/2024        [facility]     The patient (or guardian, if applicable) and other individuals in attendance with the patient were advised that Artificial Intelligence will be utilized during this visit to record, process the conversation to generate a clinical note, and support improvement of the AI technology. The patient (or guardian, if applicable) and other individuals in attendance at the appointment consented to the use of AI, including the recording.         Assessment & Plan (Medical Decision Making)      Impression: 89 y.o. male     Assessment & Plan  1. Chronic Obstructive Pulmonary Disease (COPD).  He reports coughing up phlegm and mucus for the last few weeks and has experienced increased shortness of breath and wheezing, leading to an emergency room visit on 03/29/2024. He will continue using Stiolto until the new medications, Wixela and Spiriva, are received from the pharmacy. Once these are obtained, he will discontinue Stiolto and start using Wixela and Spiriva daily.  - DME - DURABLE MEDICAL EQUIPMENT  - tiotropium bromide  (SPIRIVA RESPIMAT) 2.5 MCG/ACT AERS inhaler; Inhale 2 puffs into the lungs daily  Dispense: 3 each; Refill: 3  - fluticasone -salmeterol (WIXELA INHUB ) 250-50 MCG/ACT AEPB diskus inhaler; Inhale 1 puff into the lungs in the morning and 1 puff in the evening.  Dispense: 3 each; Refill: 3  - ipratropium 0.5 mg-albuterol  2.5 mg (DUONEB ) 0.5-2.5 (3) MG/3ML SOLN nebulizer solution; Inhale 3 mLs into the lungs 4 times daily as needed for Shortness of Breath or Wheezing Diagnosis J44.9  Dispense: 360 mL; Refill: 11  - DEMO &/OR EVAL,PT USE,AEROSOL DEVICE    2. Left Pleural Effusion.  His x-ray today shows ongoing fluid around the left lung, not necessarily worse. He was informed that the fluid is likely to recur until his heart failure is  better managed. Given his age and current health status, it was decided to manage this condition medically rather than through thoracentesis. He will be prescribed Zaroxolyn , to be taken 30 minutes prior to his Lasix  dose every third day. This medication will be sent to the pharmacy. He was advised to take Lasix  in the morning.    3. Acute Heart Failure.  He experienced acute heart failure on 03/29/2024, for which he was given fluid medicine. He is currently taking Lasix  60 mg daily as advised by his cardiologist. He was instructed to continue this regimen and to take Lasix  in the morning to avoid frequent nighttime urination. Compression wraps were recommended as an alternative to compression stockings, which he finds difficult to use.  Check BMP next visit.  - metOLazone  (ZAROXOLYN ) 2.5 MG tablet; Take 1 tablet by mouth every 72 hours  Dispense: 30 tablet; Refill: 0          Orders Placed This Encounter   Medications    tiotropium bromide  (SPIRIVA RESPIMAT) 2.5 MCG/ACT AERS inhaler     Sig: Inhale 2 puffs into the lungs daily     Dispense:  3 each     Refill:  3    fluticasone -salmeterol (WIXELA INHUB ) 250-50 MCG/ACT AEPB diskus inhaler     Sig: Inhale 1 puff into the lungs in the morning and 1 puff in  the evening.     Dispense:  3 each     Refill:  3    metOLazone  (ZAROXOLYN ) 2.5 MG tablet     Sig: Take 1 tablet by mouth every 72 hours     Dispense:  30 tablet     Refill:  0    ipratropium 0.5 mg-albuterol  2.5 mg (DUONEB ) 0.5-2.5 (3) MG/3ML SOLN nebulizer solution     Sig: Inhale 3 mLs into the lungs 4 times daily as needed for Shortness of Breath or Wheezing Diagnosis J44.9     Dispense:  360 mL     Refill:  11         Procedures    DME - DURABLE MEDICAL EQUIPMENT     Nebulizer and supplies     Follow-up and Dispositions    Return in about 1 month (around 05/26/2024) for [name], COPD, pleural effusion, CXR and labs.       Collaborating MD is Dr. Fairy Greet    ADS    Dorthea CHRISTELLA Lofty, APRN -  CNP      _________________________________________________________________________    HISTORY OF PRESENT ILLNESS:    Paul Medina is a 89 y.o. male who is seen at [facility] today for  pleural effusion       History of Present Illness  The patient is a 89 year old male who presents for follow-up of COPD and left pleural effusion. He has a 34-pack-year history of cigarette smoking but quit smoking in 1990. Previous office spirometry done in 2024 revealed moderate obstruction. He had pneumonia in 05/2023 and COVID-19, which required hospitalization. He has a history of B-cell lymphoma, diagnosed around 2023, for which he underwent chemotherapy.    He reports experiencing respiratory symptoms, characterized by coughing up phlegm and mucus, persisting for several weeks. He has been using Stiolto as part of his treatment regimen. He does not experience shortness of breath when lying down but notes a need to cough up phlegm upon rising. He uses a CPAP machine during sleep.    His shortness of breath has been more pronounced recently, necessitating an emergency room visit at [facility] on 03/29/2024 due to breathing difficulties. During this visit, he was advised to have a nebulizer on hand and is requesting a prescription for the same through the [facility]. He has been compliant with his Lasix  medication, taking it daily as prescribed by his cardiologist. A few weeks ago, his dosage was increased to 60 mg per day for a duration of 2 weeks, but he did not observe any significant improvement in his condition. He expresses concern about the efficacy of furosemide  in managing his symptoms. He has been provided with compression stockings but reports difficulty in wearing them due to wrist issues.    SOCIAL HISTORY  Tobacco: The patient has a 34-pack-year history of cigarette smoking but quit smoking in 1990.      REVIEW OF SYSTEMS: 10 point review of systems is negative except as reported in HPI.    PHYSICAL  EXAM: Body mass index is 28.31 kg/m.  Vitals:    04/25/24 1032   BP: 100/60   Pulse: 75   Resp: 19   Temp: 98.4 F (36.9 C)   TempSrc: Temporal   SpO2: 96%   Weight: 79.6 kg (175 lb 6.4 oz)   Height: 1.676 m (5' 6)         General:   Alert, cooperative, no distress, appears stated age.  Eyes:   Conjunctivae/corneas clear. PERRL        Mouth/Throat:  Lips, mucosa, and tongue normal. Teeth and gums normal.        Lungs:     Breath sounds with faint wheezing and minimally decreased on left.     Heart:   Regular rate and rhythm, S1, S2 normal, no murmur, click, rub or gallop.     Abdomen:    Soft, non-tender.     Extremities:  Extremities normal, atraumatic, no cyanosis or edema.     Skin:  Skin color normal. No rashes or lesions     Neurologic:  A&Ox3     DIAGNOSTIC TESTS:                                                                                    LABS:   Lab Results   Component Value Date/Time    WBC 6.2 07/20/2023 06:25 AM    HGB 8.1 07/20/2023 06:25 AM    HCT 23.4 07/20/2023 06:25 AM    PLT 122 07/20/2023 06:25 AM    CRP 6.8 07/09/2023 11:41 AM     Imaging: I performed an independent interpretation of the patient's images.  CXR:   XR CHEST 1 VIEW 03/29/2024    Narrative  XR CHEST 1 VW    Clinical Indication: ;hx of copd + CHF;    Comparison:  08/07/2023    Portable AP single view of the chest demonstrates the heart size to be borderline. The lungs demonstrate perihilar to basilar interstitial edema pattern with asymmetric confluence of mild edema/atelectasis in the left base and probable small effusion.    Impression  Perihilar to basilar interstitial edema pattern with asymmetric confluence of mild edema and atelectasis/small effusion in the left base.    Signed by: 03/29/2024 10:21 AM: Kennyth MD, Bertrum    Today--04/25/24--PA and lateral CXR--formal report pending.  Small left effusion      CT Chest:   CT CHEST ABDOMEN PELVIS WO CONTRAST 12/12/2023    Narrative  EXAM: CT CHEST - ABDOMEN - PELVIS  NON-CONTRAST.    HISTORY: C83.30 Diffuse large B-cell lymphoma, unspecified site (HCC) I10;;DLBCL    COMPARISON:  1.  March 17 and November 2024    TECHNIQUE: Volume acquisition of the chest, abdomen and pelvis was obtained from the thoracic inlet to the symphysis pubis without contrast. Oral contrast was not administered. Data was reconstructed at  4  mm. Axial sagittal coronal images were constructed and reviewed.    FINDINGS:  Lungs - Pleura:    1. There is multifocal areas of groundglass opacity and airspace consolidation present bilaterally. There has been interval improvement in comparison to prior study with decrease in the size of the airspace consolidations in comparison to prior exam of March 17. The appearance suggests multifocal pneumonia. Continued follow-up is recommended to exclude an underlying malignancy.  2. Small bilateral pleural effusions. Left pleural effusion has decreased in size. The right pleural effusion appears to be new.  3. Bronchial wall thickening is noted bilaterally particularly in the lower lobes similar to prior studies.    Hila - Mediastinum:  No significant mediastinal adenopathy. No obvious  hilar adenopathy in this noncontrast exam.    Heart - Vascular:  Heart size is upper limits of normal but stable from prior exam. Coronary calcifications are noted.    Liver:  Noncontrast images of the liver demonstrates no focal lesions    Gallbladder - Biliary Tree:  Gallbladder is contracted. No biliary dilatation    Pancreas:  Stable 1.6 cm low-attenuation lesion seen near the body of the pancreas. This is likely cystic in nature possibly a IPMN. This could be further evaluated with MRI.    Spleen:  No focal lesions in the spleen    Retroperitoneum:  Adrenals: Stable left adrenal nodule measuring 1.6 cm. Right adrenal gland is normal.  Kidneys:  Lobular scarred appearance to the kidneys. No hydronephrosis.  Aorto - Cava:  Normal caliber. No aneurysm. Vascular calcifications.  Lymphatics:    No adenopathy.    GI - Mesentery - Peritoneum:  Stomach is mildly distended with food-like material. Duodenum and small bowel demonstrates no thickening or dilatation. No obstruction. There is diverticulosis of the descending colon and sigmoid colon without evidence for diverticulitis.    Pelvis:  Bladder is unremarkable. No pelvic masses or free fluid.    Soft tissues - MSK:  Degenerative changes and scoliosis seen in the spine.    Impression  1. Multiple focal areas of groundglass and opacities and airspace consolidation are again noted improved from prior study. The appearance suggests multifocal pneumonia. Continued follow-up is recommended to exclude an underlying malignancy.  2. Small bilateral pleural effusions.  3. Stable 1.6 cm low-attenuation lesion seen in the body of pancreas. This is likely cystic in nature possibly an IPMN. This could be further evaluated with MRI.  4. Stable 1.6 cm left adrenal nodule.  5. Diverticulosis of the descending and sigmoid colon. No evidence for diverticulitis    Signed by: 12/12/2023 3:03 PM: Jama MD, Leatrice    Nuclear Medicine: No results found for this or any previous visit from the past 3650 days.    PFTs:       Latest Ref Rng & Units 05/11/2023    12:00 AM   Office Spirometry Results   FVC L 2.33    FEV1 L 1.39    FEV1 %Pred-Pre % 62    FVC %Pred-Pre % 75    FEV1/FVC % 60      No results found for this or any previous visit. No results found for this or any previous visit.    FeNO: No results found for this or any previous visit.  FeNO and Likelihood of Eosinophilic Asthma   Unlikely Intermediate Likely   <25 ppb 25-50 ppb >50ppb     Exercise Oximetry:    Echo:   ECHO (TTE) COMPLETE (PRN CONTRAST/BUBBLE/STRAIN/3D) 06/29/2023    Interpretation Summary    Left Ventricle: Moderately reduced left ventricular systolic function with a visually estimated EF of 30 - 35%. Left ventricle is moderately dilated. Normal wall thickness. Grade II diastolic dysfunction with  increased LAP.    Aortic Valve: Mild regurgitation. Mild stenosis of the aortic valve (DI ~0.44).    Mitral Valve: Mild to moderate regurgitation with a posterior directed jet.    Left Atrium: Left atrium is mildly dilated.    Image quality is adequate. Contrast used: Lumason .    PMH Reference Info:  Immunization History   Administered Date(s) Administered    COVID-19, Inactive, MODERNA BLUE border, Primary or Immunocompromised, (age 12y+) 06/05/2019, 07/03/2019, 03/30/2020    COVID-19, Inactive, MODERNA Bivalent, (age 12y+) 03/29/2021    COVID-19, SPIKEVAX Laren), (age 12y+), IM, 42mcg/0.5mL 02/15/2023    Influenza A (H1n1-09),all Formulations 05/23/2008    Influenza Virus Vaccine 12/28/1999, 03/31/2001, 03/23/2002, 03/06/2004, 03/23/2005, 03/13/2006, 02/21/2007, 01/18/2008, 02/20/2009, 02/12/2010, 03/24/2011, 03/23/2012, 02/23/2013, 04/07/2015, 02/24/2017, 02/21/2019    Influenza, FLUAD, (age 22 y+), IM, Quadv, 0.25mL 02/24/2020, 03/15/2021    Influenza, FLUAD, (age 34 y+), IM, Trivalent PF, 0.5mL 03/07/2018    Influenza, FLUZONE High Dose, (age 48 y+), IM, Trivalent PF, 0.42mL 02/20/2014, 03/24/2015, 03/26/2016, 01/21/2017    Pneumococcal Vaccine 05/23/1997, 12/28/1999, 11/30/2011    Pneumococcal, PCV-13, PREVNAR 13, (age 6w+), IM, 0.12mL 11/30/2011, 05/30/2014    Pneumococcal, PPSV23, PNEUMOVAX 23, (age 2y+), SC/IM, 0.65mL 06/01/2016, 06/01/2016    TDaP, ADACEL (age 66y-64y), BOOSTRIX (age 10y+), IM, 0.16mL 05/29/2013, 03/20/2017    Td vaccine (adult) 01/15/2003    Tetanus 01/15/2003    Zoster Live (Zostavax) 12/22/2006     Past Medical History:   Diagnosis Date    Arthritis     Asthma     Chronic bronchitis (HCC)     Hypertension     Lung cancer (HCC)         Tobacco Use      Smoking status: Former        Packs/day: 0.00        Years: 1 pack/day for 34.0 years (34.0 ttl pk-yrs)        Types: Cigarettes         Start date: 05/31/1954        Quit date: 1990        Years since quitting: 35.9      Smokeless tobacco: Never      Tobacco comments: Smoked a pack a day for about 46 years.  Quit about 2001    Allergies   Allergen Reactions    Ciprofloxacin Other (See Comments)     Other Reaction(s): Joint swelling-Intolerance    Doxycycline  Nausea And Vomiting and Nausea Only     Other Reaction(s): GI Intolerance    Lisinopril Cough and Other (See Comments)     Other Reaction(s): Cough, Cough-Intolerance, Other (See Comments)    cough    cough   cough   cough    Other Other (See Comments) and Nausea And Vomiting     Causes sneezing, itchy/runny nose    Other Reaction(s): GI Intolerance    unknown    unknown, unknown    unknown   unknown    Causes sneezing, itchy/runny nose   Causes sneezing, itchy/runny nose   Causes sneezing, itchy/runny nose    Sulfa Antibiotics Nausea And Vomiting, Other (See Comments) and Rash     Other Reaction(s): GI Intolerance, Other- (not listed) - Allergy, Unknown    Unknown    Sulfasalazine Rash    Sulfur  Itching and Nausea Only     Current Outpatient Medications   Medication Instructions    albuterol  sulfate HFA (PROVENTIL ;VENTOLIN ;PROAIR ) 108 (90 Base) MCG/ACT inhaler 2 puffs, Inhalation, EVERY 4 HOURS PRN    allopurinol (ZYLOPRIM) 300 mg    apixaban  (ELIQUIS ) 2.5 mg, Oral, 2 TIMES DAILY    ascorbic acid (VITAMIN C) 500 mg, DAILY    atropine  1 % ophthalmic solution 1 drop, 2 TIMES DAILY    brimonidine  (ALPHAGAN  P) 0.15 % ophthalmic solution 1 drop, 3  TIMES DAILY    carboxymethylcellulose PF (REFRESH PLUS) 0.5 % SOLN ophthalmic solution 1 drop, 4 TIMES DAILY PRN    fluticasone -salmeterol (WIXELA INHUB ) 250-50 MCG/ACT AEPB diskus inhaler 1 puff, Inhalation, EVERY 12 HOURS    furosemide  (LASIX ) 40 mg, Oral, DAILY    ipratropium 0.5 mg-albuterol  2.5 mg (DUONEB ) 0.5-2.5 (3) MG/3ML SOLN nebulizer solution 3 mLs, Inhalation, 4 TIMES DAILY PRN, Diagnosis J44.9    lactobacillus (CULTURELLE) CAPS capsule 1  capsule, DAILY    latanoprost (XALATAN) 0.005 % ophthalmic solution 1 drop    lidocaine  (LIDODERM ) 5 % 1 patch, DAILY    metOLazone  (ZAROXOLYN ) 2.5 mg, Oral, EVERY 72 HOURS    metoprolol  succinate (TOPROL  XL) 25 MG extended release tablet Take by mouth    Multiple Vitamin (MULTIVITAMIN) tablet 1 tablet, DAILY    prednisoLONE  acetate (PRED FORTE ) 1 % ophthalmic suspension 1 drop, 2 TIMES DAILY    rosuvastatin  (CRESTOR ) 30 mg, DAILY    tiotropium bromide  (SPIRIVA RESPIMAT) 2.5 MCG/ACT AERS inhaler 2 puffs, Inhalation, DAILY RESP    tiotropium-olodaterol (STIOLTO) 2.5-2.5 MCG/ACT AERS 2 puffs, Inhalation, DAILY    Vitamin D-1000 Max St 1,000 Units, DAILY     "

## 2024-04-25 NOTE — Patient Instructions (Signed)
"  Continue Stiolto 2 puffs once daily until your new medication arrives.  Once new inhalers arrive from [facility], start Wixela 1 puff twice daily and Spiriva 2 puffs once daily.  Rinse mouth after using Wixela.    May use nebulizer with Duoneb  4 times a day IF NEEDED for relief of cough or wheezing.    Start Zaroxolyn  2.5 mg, every 3 days--take 30 minutes prior to Lasix .  "

## 2024-04-25 NOTE — Progress Notes (Signed)
"  I faxed order for nebulizer machine and neb kits to [facility]. Paul Medina  "

## 2024-05-20 NOTE — Discharge Summary (Signed)
 -------------------------------------------------------------------------------  Attestation signed by Zafar, Azeem, MD at 05/21/2024 11:02 AM  I was available for questions/assistance, and participated in medical decision making, but did not personally examine or provide direct clinical care. The patient was independently seen, and evaluated by the NP.  Discussed case with provider.  -------------------------------------------------------------------------------     Discharge Summary [facility]     Admit Date:  05/14/2024  Discharge Date:  05/20/2024  Total LOS:  6    Admitting Attending: Elsie Otto, MD  Discharge Attending: Azeem Zafar, MD     Demographics     [facility] None    Code Status: Full Code   Readmit Risk: LACE+ Score: 84 Diet: Diet Adult; Regular Diet; Pureed; Mildly Thick/Nectar Thick Liquids; Patient can order  [employer] [facility] [place]; 1 - One        Follow Up     Future Appointments   Date Time Provider Department Center   07/04/2024 10:45 AM [facility] None   07/04/2024 11:15 AM Cora Elvie Galley, DO [facility] None   07/25/2024  2:00 PM Maurides, Elspeth Coy, MD [facility]     Requested Follow Up       [facility]        Timing: Follow up in 1 month(s)          Recommendations for follow-up providers:  Follow-up with [facility]ology ([facility])  Follow-up with PCP     Pending studies:   Pleural fluid culture  Discharge Diagnoses     Principal Problem:    Pleural effusion (POA: Yes)  Active Problems:    HFrEF (heart failure with reduced ejection fraction) (HCC) (POA: Yes)    COPD (chronic obstructive pulmonary disease) (HCC) (POA: Yes)    Paroxysmal A-fib (HCC) (POA: Yes)    Oropharyngeal dysphagia (POA: Yes)    Generalized weakness (POA: Yes)    Aspiration pneumonia (HCC) (POA: Yes)  Resolved Problems:    AKI (acute kidney injury) (POA: Not Specified)     HPI and Hospital Course     Chief Complaint   Patient presents with     Fatigue    Dehydration    Shortness of Breath         [name] is a 89 year old male with a history of heart failure with reduced ejection fraction (HFrEF), chronic obstructive pulmonary disease (COPD), paroxysmal atrial fibrillation, prior diffuse large B-cell lymphoma, and oropharyngeal dysphagia, admitted with fatigue, shortness of breath, and was found to have a new large left pleural effusion and aspiration pneumonia.    [facility] Course    Pleural Effusion   He was admitted with a new large left pleural effusion seen on CT, with chronic small bilateral effusions at baseline. Pulmonology was consulted and recommended ultrasound-guided thoracentesis after holding apixaban  for 72 hours due to renal insufficiency. Heparin was initiated for DVT prophylaxis during this period. On 12/29, 1500 cc of serosanguinous fluid was removed via ultrasound-guided thoracentesis without complications aside from mild post-procedure chest pain. Post-procedure chest X-ray was performed and was negative for any complications. The etiology of the effusion remains under evaluation, with consideration of parapneumonic, volume overload, or malignant causes given his history of lymphoma. He will require pulmonary follow-up after discharge.    Aspiration Pneumonia   He presented with fatigue and shortness of breath, and imaging revealed new branching high-density material in the left lower lobe consistent with aspiration. He was  treated with IV piperacillin -tazobactam, transitioned to oral levofloxacin as renal function improved, and completed a 5-day antibiotic course. He remained afebrile with stable white blood cell count and improved respiratory status. Speech therapy evaluated him and recommended a pured diet with mildly thick liquids due to ongoing aspiration risk. He was maintained on aspiration precautions throughout hospitalization.    Heart Failure with Reduced Ejection Fraction (HFrEF)   He has a  history of HFrEF (EF 40-45% on prior echo). He was admitted with intravascular volume depletion, likely due to recent outpatient diuretic escalation. Diuretics were held during hospitalization due to acute kidney injury and volume status was closely monitored. He remained euvolemic and did not have evidence of acute decompensation. NT-proBNP was 754 on admission.    Acute Kidney Injury (Resolved)   He was admitted with acute kidney injury (creatinine increased from baseline 1.1-1.25 to 2.1), attributed to recent diuretic increase. Diuretics and nephrotoxic agents were held, and he received IV fluids. Renal function improved to creatinine 1.38 by discharge. Renal ultrasound showed no hydronephrosis or obstruction.    COPD   He has a history of COPD, previously on inhaled therapies. He presented with shortness of breath but did not have an acute exacerbation or wheezing during admission. He was continued on inhaled budesonide, arformoterol , and tiotropium. He was a former smoker with a 30 pack-year history, quit around 26.    Paroxysmal Atrial Fibrillation   He has a history of paroxysmal atrial fibrillation, previously on apixaban  and metoprolol . Apixaban  was held for 72 hours prior to thoracentesis and heparin was used for DVT prophylaxis. Heart rate remained well controlled in the 60s-70s. Metoprolol  was briefly held for bradycardia and then resumed. Eliquis  can be resumed at discharge.     Oropharyngeal Dysphagia   He has a known history of oropharyngeal and esophageal dysphagia, with prior SLP evaluations and modified barium swallow studies. During this admission, bedside and FEES evaluations confirmed mild oropharyngeal dysphagia with increased aspiration risk, especially with thin liquids. He was placed on a pured diet with mildly thick liquids and aspiration precautions, with 1:1 assistance recommended for oral intake. He tolerated this diet well with adequate intake documented. SLP follow-up was  recommended at the next level of care. Home health order placed.     Generalized Weakness   He experienced generalized weakness and debility, attributed to acute illness and age-related decline. Physical and occupational therapy were consulted for evaluation and discharge planning. He required assistance with some self-care activities but was able to perform others independently.  PT/ OT evaluated and recommended PAS. Patient reports he is now feeling better and would prefer to return home. Home health ordered at discharge.     Other Relevant Issues   - He experienced significant unintentional weight loss (8.4% over one month) prior to admission, likely multifactorial due to CHF, decreased oral intake, and acute illness. Nutrition was monitored and a Magic Cup supplement was added to his diet.  - He was on CPAP for OSA at home and used his device during hospitalization as needed.  - His code status was discussed; he initially requested full code but later expressed a preference for DNR in the event of cardiac arrest, which was communicated to the hospitalist team.    Discharge planning was coordinated with case management, with the patient expressing a preference for returning home with home health rather than rehabilitation placement.            Additional Health Factors:   Overweight - BMI (  Calculated): 26.15       Consults: Pulmonary/Intensive care  Procedures: thoracentesis 12/29  Discharge Exam and Pertinent Labs     Vitals:   Most Recent: Range (last 24 hours)   Temperature: 97.9 F (36.6 C)  [97.9 F (36.6 C)-98.4 F (36.9 C)]    Heart Rate: 67  [67-83]    Resp Rate: 18  [17-19]    Blood Pressure: 128/71 (106-128)/(62-80)    Oxygen Sats: 100 %  [94 %-100 %]    Oxygen Device: Room Air    Weight: 73.5 kg (162 lb)      Discharge Condition: stable    Physical Exam:  General: Chronically debilitated. Not in distress.  HEENT: Normocephalic, atraumatic. Pupils equal and reactive.  Heart: Regular rate and rhythm.  No murmurs, rubs or gallops.  Chest: Normal work of breathing. Diminished over left lung.  Abdomen: Soft, non-tender, non-distended, bowel sounds present.  Extremities: Pulses equal. No edema.  Skin: No lesions or ulcers   Neuro: A&O x 3. No gross neurologic deficit.    Psych: Normal mood and affect.      Recent [facility] Labs:  Selected Recent Results   Lab Units 05/20/24  0426   WBC K/uL 6.4   HEMOGLOBIN g/dL 8.6*   HEMATOCRIT % 75.0*   PLATELETS K/uL 241     Selected Recent Results   Lab Units 05/20/24  0426   SODIUM mmol/L 137   POTASSIUM mmol/L 3.7   CHLORIDE mmol/L 100   CO2 mmol/L 27   BUN mg/dL 33*   CREATININE mg/dL 8.61*   GLUCOSE mg/dL 890*   CALCIUM  mg/dL 9.5         Additional [facility] Test Results:     Micro:   Micro Results       Procedure Component Value Units Date/Time    Body Fluid Culture (Includes Gram Stain) [[id number]] Collected: 05/20/24 1139    Order Status: Sent Specimen: Body Fluid from Pleural, Left Updated: 05/20/24 1147    Blood Culture, Routine [[id number]] Collected: 05/14/24 1834    Order Status: Completed Specimen: Blood from Arm, Left Updated: 05/19/24 2315     Blood Culture, Routine No growth after 5 days    Blood Culture, Routine [[id number]] Collected: 05/14/24 1834    Order Status: Completed Specimen: Blood from Arm, Left Updated: 05/19/24 2315     Blood Culture, Routine No growth after 5 days    COVID/FLU/RSV 4-Plex PCR [[id number]]  (Normal) Collected: 05/14/24 1603    Order Status: Completed Specimen: Swab - Respiratory from Nasopharyngeal Swab Updated: 05/14/24 1701     SARS-CoV-2 RNA by NAA (includes PCR) Negative     Influenza A RNA, PCR Negative Influenza A by RNA PCR     Influenza B RNA, PCR Negative Influenza B by RNA PCR     RSV RNA, PCR Negative RSV by RNA PCR    Narrative:      A negative result does not preclude infection from COVID-19, Flu A, Flu B, or RSV and should not be used as the sole basis for treatment or patient management decisions. A false negative may occur if  the virus is present at levels below the level of detection.  A false negative result may also occur if a specimen is improperly collected, transported, or handled.  Laboratory test results should always be considered in the context of clinical observations and epidemiological data in making a final diagnosis and patient management decisions. If a false negative result is suspected, discussion or consultation  with ID is required for re-testing.            Imaging:   Ultrasound renal completed with bladder on 12/23  Impression:  No hydronephrosis     CT chest without contrast on 12/23  Impression:  There is a new large left pleural effusion with multisegment atelectasis involving the left lower lobe.  There is   There is multifocal nonspecific consolidation within the right lung which is similar to the CT chest of 12/12/2023 but improved from 08/07/2023     CXR 1 view on 12/23  Impression:  Progressive retrocardiac/left basilar consolidation, suspicious for enlarging effusion and atelectasis. Difficult to exclude underlying infection or mass. Consider CT chest with contrast for further evaluation    Xray chest 12/29  No pneumothorax following thoracentesis.     Discharge Medications         Discharge Medication List        PAUSE taking these medications      losartan 25 mg  Wait to take this until your doctor or other care provider tells you to start again.  25 mg by Oral/Gastric Tube route in the morning.  Refills: 0  Commonly known as: COZAAR            Modified Medications      furosemide  40 mg tablet  Take 1 tablet (40 mg) by mouth daily  Qty: 30 tablet  Refills: 1  Commonly known as: LASIX   What changed: how much to take            Continued Medications      albuterol  90 mcg/actuation inhaler  Inhale 2 puffs every 4 (four) hours as needed for wheezing  Refills: 0  Commonly known as: VENTOLIN  HFA     albuterol  2.5 mg /3 mL (0.083 %) nebulizer solution  Take 3 mL (2.5 mg) by nebulization every 4 (four) hours as  needed for wheezing  Qty: 90 mL  Refills: 0     allopurinoL 300 mg tablet  Take 300 mg by mouth in the morning.  Refills: 0     apixaban  5 mg  Take 1 tablet (5 mg) by mouth 2 (two) times a day  Qty: 60 tablet  Refills: 11  Last Dose: 5 mg on May 16, 2024  8:41 PM  Commonly known as: ELIQUIS      atropine  1 % ophthalmic solution  Administer 1 drop into the left eye in the morning and 1 drop before bedtime.  Refills: 0  Last Dose: 1 drop on May 20, 2024  9:37 AM     brimonidine  0.15 % ophthalmic solution  Administer 1 drop into the left eye in the morning and 1 drop in the evening and 1 drop before bedtime.  Refills: 0  Last Dose: Ask your nurse or doctor  Commonly known as: ALPHAGAN      carboxymethylcellulose 0.5 %  Administer 1 drop to both eyes as needed in the morning and 1 drop as needed at noon and 1 drop as needed in the evening and 1 drop as needed before bedtime for dry eyes.  Refills: 0  Commonly known as: REFRESH PLUS-DROPPERETTE     cholecalciferol (vitamin D3) 1,000 unit tablet  1,000 Units by Oral/Gastric Tube route in the morning.  Refills: 0     Culturelle Digestive Health 10 billion cell -200 mg Cap  Take 1 capsule by mouth daily  Refills: 0  Generic drug: Lactobac. rhamnosus GG-inulin     diclofenac sodium 1 %  gel  Apply 1-2 g topically 2 (two) times a day as needed (pain)  Refills: 0     fluticasone  propion-salmeteroL 250-50 mcg/dose DISKUS  Inhale 1 puff 2 (two) times a day Rinse mouth after use.  Refills: 0  Commonly known as: ADVAIR      latanoprost 0.005 % ophthalmic solution  Administer 1 drop into the left eye nightly  Refills: 0  Last Dose: 1 drop on May 16, 2024  8:40 PM  Commonly known as: XALATAN     metoprolol  25 mg 24 hr tablet  Take 0.5 tablets (12.5 mg) by mouth daily  Qty: 15 tablet  Refills: 11  Last Dose: 12.5 mg on May 20, 2024  9:37 AM  Commonly known as: Toprol  XL     multivitamin 9 mg iron/15 mL  Take 15 mL by mouth daily  Refills: 0  Commonly known as:  MULT-VITE B PROTECTED     prednisoLONE  acetate 1 % ophthalmic suspension  Administer 1 drop into the left eye in the morning and 1 drop before bedtime.  Refills: 0  Last Dose: 1 drop on May 20, 2024  9:37 AM  Commonly known as: PRED FORTE      Saccharomyces boulardii 250 mg capsule  Take 1 capsule (250 mg) by mouth 2 (two) times a day  Qty: 60 capsule  Refills: 0  Commonly known as: FLORASTOR     Stiolto Respimat  2.5-2.5 mcg/actuation Mist  Inhale 2 puffs daily  Refills: 0  Generic drug: tiotropium-olodateroL     tiotropium 18 mcg inhalation capsule  Inhale 2 puffs daily  Refills: 0  Last Dose: Ask your nurse or doctor  Commonly known as: SPIRIVA HANDIHALER            Stopped Medications      ascorbic acid (vitamin C) 500 mg tablet  Commonly known as: VITAMIN C     cephalexin 500 mg capsule     ferrous sulfate 325 mg (65 mg iron) tablet     methylPREDNISolone  4 mg tablet  Commonly known as: MEDROL  DOSEPACK     moxifloxacin 0.5 % ophthalmic drops  Commonly known as: MOXEZA     predniSONE  20 mg tablet            ASK your doctor about these medications      nebulizer and compressor  Use as instructed  Qty: 1 each  Refills: 0              Administrative   Discharge Disposition: Home w/ Home Health      Time: On the day of discharge, I spent 46 minutes reviewing pertinent records, performing/confirming relevant history and exam, discussing and coordinating care, and documenting the encounter.    Eleanor Awkward, NP  Hospitalist

## 2024-05-29 NOTE — Progress Notes (Signed)
 Office Visit Note   05/29/2024   Paul Medina  [id number]        History of Present Illness  Paul Medina is a 89 year old male with diffuse large B-cell lymphoma who presents for follow-up after recent imaging and pleural effusion drainage. He is accompanied by his daughter.    He has a history of diffuse large B-cell lymphoma, initially treated with R-CHOP chemotherapy approximately two years ago. He tolerated the treatment well, experiencing extreme fatigue and sleep disturbances due to steroids, but no significant gastrointestinal side effects. Recently, a CT scan of the chest revealed a left pleural effusion. Approximately one liter of fluid was drained, and the fluid was noted to be red, likely due to blood. Subsequent analysis confirmed the presence of lymphoma.    Over the past six months, he has experienced recurrent episodes of diarrhea, initially thought to be food poisoning, occurring about four times. No associated vomiting was reported. His daughter noted that these episodes were not consistent with food poisoning.    In July 2025, imaging showed small bilateral pleural effusions and pneumonia, but no evident cancer. However, in December, a large pleural effusion was confirmed to be related to his lymphoma. He has a history of anemia, which has been persistent for many years, even prior to his lymphoma diagnosis.        A complete review of systems was performed and pertinent information noted above in HPI. ROS otherwise negative.     Objective:     Vitals:    05/29/24 1443   BP: 99/64   Pulse: 84   Resp: 18   Temp: 97.2 F (36.2 C)   SpO2: 97%         Alert awake oriented 3 in no acute distress  Heart is regular  Lungs are clear to auscultation bilaterally  Abdomen is soft nontender nondistended  Extremities reveal no edema cyanosis or clubbing  Neurologic exam - ambulates with a rolling walker, +kyphosis      Labs:     Results for orders placed or performed in visit on 05/29/24   CBC  w/Differential    Collection Time: 05/29/24  1:53 PM   Result Value Ref Range    WBC 7.9 3.5 - 10.8 K/uL    RBC 2.93 (L) 4.17 - 5.89 M/uL    Hemoglobin 9.0 (L) 12.7 - 17.2 g/dL    Hematocrit 73.2 (L) 39.4 - 51.6 %    MCV 91.2 79.0 - 100.0 fL    MCH 30.8 25.7 - 33.2 pg    MCHC 33.8 30.0 - 36.5 g/dL    RDW-CV 82.4 (H) 88.3 - 16.0 %    Platelets 183 150 - 400 K/uL    MPV 8.8 6.9 - 10.6 fL    Neutrophils, % 65.3   %    Lymphocytes, % 21.1   %    Monocytes, % 12.2 %    Eosinophils % 0.4 %    Basophils % 1.0 %    Neutrophils, Abs 5.20 1.78 - 5.38 K/uL    Lymphocytes, Abs 1.70 1.32 - 3.57 K/uL    Monocytes, Abs 1.00 (H) 0.30 - 0.82 K/uL    Eosinophils, Abs <0.03 (L) 0.04 - 0.54 K/uL    Basophils, Abs 0.10 (H) 0.01 - 0.08 K/uL    Neutrophils, Abs 5.20 1.78 - 5.38 K/uL   Comprehensive Metabolic Panel (CMP)    Collection Time: 05/29/24  1:53 PM   Result Value Ref Range  Sodium 135 (L) 136 - 145 mmol/L    Potassium 4.9 3.5 - 5.1 mmol/L    Chloride 101 98 - 107 mmol/L    CO2 25 20 - 30 mmol/L    Anion Gap 9 6 - 16 mmol/L    BUN 24 8 - 26 mg/dL    Creatinine 8.59 (H) 0.72 - 1.25 mg/dL    Glucose 886 (H) 70 - 99 mg/dL    Calcium  8.9 8.5 - 10.4 mg/dL    AST 16 5 - 34 IU/L    ALT 10 <55 IU/L    Alkaline Phosphatase 59 40 - 150 IU/L    Protein, Total 6.0 (L) 6.2 - 8.3 g/dL    Albumin 3.5 3.4 - 4.8 g/dL    Bilirubin, Total 0.9 0.1 - 1.2 mg/dL    eGFR 47 (L) >40 fO/fpw/8.26 m2   Lactate Dehydrogenase    Collection Time: 05/29/24  1:53 PM   Result Value Ref Range    LD 185 125 - 220 IU/L         Recent Imaging:     EXAM: CT CHEST - ABDOMEN - PELVIS NON-CONTRAST.     HISTORY: C83.30 Diffuse large B-cell lymphoma, unspecified site (HCC) I10;;DLBCL      COMPARISON:   1.  March 17 and November 2024      TECHNIQUE: Volume acquisition of the chest, abdomen and pelvis was obtained from the thoracic inlet to the symphysis pubis without contrast. Oral contrast was not administered. Data was reconstructed at  4  mm. Axial sagittal coronal  images were constructed and reviewed.     FINDINGS:    Lungs - Pleura:        1. There is multifocal areas of groundglass opacity and airspace consolidation present bilaterally. There has been interval improvement in comparison to prior study with decrease in the size of the airspace consolidations in comparison to prior exam of March 17. The appearance suggests multifocal pneumonia. Continued follow-up is recommended to exclude an underlying malignancy.  2. Small bilateral pleural effusions. Left pleural effusion has decreased in size. The right pleural effusion appears to be new.  3. Bronchial wall thickening is noted bilaterally particularly in the lower lobes similar to prior studies.      Hila - Mediastinum:     No significant mediastinal adenopathy. No obvious hilar adenopathy in this noncontrast exam.      Heart - Vascular:     Heart size is upper limits of normal but stable from prior exam. Coronary calcifications are noted.      Liver:     Noncontrast images of the liver demonstrates no focal lesions      Gallbladder - Biliary Tree:     Gallbladder is contracted. No biliary dilatation      Pancreas:     Stable 1.6 cm low-attenuation lesion seen near the body of the pancreas. This is likely cystic in nature possibly a IPMN. This could be further evaluated with MRI.      Spleen:     No focal lesions in the spleen      Retroperitoneum:   Adrenals: Stable left adrenal nodule measuring 1.6 cm. Right adrenal gland is normal.   Kidneys:  Lobular scarred appearance to the kidneys. No hydronephrosis.  Aorto - Cava:  Normal caliber. No aneurysm. Vascular calcifications.  Lymphatics:   No adenopathy.      GI - Mesentery - Peritoneum:     Stomach is mildly distended with food-like material. Duodenum  and small bowel demonstrates no thickening or dilatation. No obstruction. There is diverticulosis of the descending colon and sigmoid colon without evidence for diverticulitis.      Pelvis:      Bladder is unremarkable. No  pelvic masses or free fluid.      Soft tissues - MSK:     Degenerative changes and scoliosis seen in the spine.           IMPRESSION:  1. Multiple focal areas of groundglass and opacities and airspace consolidation are again noted improved from prior study. The appearance suggests multifocal pneumonia. Continued follow-up is recommended to exclude an underlying malignancy.  2. Small bilateral pleural effusions.  3. Stable 1.6 cm low-attenuation lesion seen in the body of pancreas. This is likely cystic in nature possibly an IPMN. This could be further evaluated with MRI.  4. Stable 1.6 cm left adrenal nodule.  5. Diverticulosis of the descending and sigmoid colon. No evidence for diverticulitis     Signed by: 12/12/2023 3:03 PM: Jama MD, Leatrice  Pathology:   No new pathology       Treatment + Therapy          Tokarczyk, Dempsey PARAS                Notice   Crossett, Dempsey PARAS does not have an active treatment plan of type ONCOLOGY TREATMENT in this episode.                     Therapy Plan Information   Andersen, Iorio               AMB/ONC IV IRON REPLACEMENT       Plan Information       Treatment Start Date   01/10/2024    Most Recent Treatment   Treatment 2: 01/17/2024    Plan Provider   Cora Elvie Galley, DO    Treatment Department   [facility]            Status   Active    Auth Status   *                    Protocol Information       Protocol   AMB/ONC IV IRON REPLACEMENT -                  Created By   Rodger Delon PARAS, [facility]    Created On   01/05/2024 11:10 AM EDT    Updated By   Beverley Sor, RN    Updated On   01/17/2024 11:15 AM EDT    Review Information   Never reviewed - Review Due On: 07/06/2025                 Assessment & Plan  Relapsed diffuse large B-cell lymphoma  Confirmed by pleural effusion analysis.   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 Epkinly preferred over CAR T-cell therapy due to lower  toxicity. Erling is a [employer] with infection risk, managed with antibiotics and IVIG. CAR T-cell therapy has significant side effects, unsuitable for his age. Epkinly involves indefinite treatment with weekly, biweekly, then monthly injections. Clonoseq for disease monitoring if he attains CR. PET scan needed for restaging.  - Initiated Epkinly treatment with weekly injections for 12 weeks, then biweekly, then monthly.  - Arrange short hospital  stay during initial Epkinly ramp-up for side effect monitoring.  - Schedule PET scan for restaging.  - Provided information about Epkinly.  - Scheduled nurse practitioner session for therapy education.  - Coordinated treatment timing with schedulers.    History of prostate cancer, in remission  - s/p seed treatment    DLBCL, in remission. Stage III completed mini-RCHOP (no doxo with C6) 03/2022  Non-Hodgkin lymphoma in remission, normal LDH levels, decreased relapse risk over time.  - Maintain surveillance.    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    Chronic obstructive pulmonary disease, stable on inhaler  COPD stable with inhaler, occasional congestion and coughing.    Osteoarthritis of the hands  Osteoarthritis with hand swelling, possible overlap with gout-related arthritis.    Personal h/o skin cancer (non-melanoma), resected      He will RTC for NP teach as scheduled.  Encouraged the patient to contact our office with any questions or concerns should they arise during interim.     Signed By: Elvie Rummer, DO    05/29/2024     Physician Attestation: The information above serves as a record of the service and decisions personally performed and made by me. I have seen and examined this patient and have reviewed all pertinent clinical information, including history, physical exam and plan. This document was created on my behalf and reflects my statements to the medical scribe.    Scribe: Personal Attestation  This  document serves as a record of the service and decisions personally performed by the attending. It was created on her behalf by Bradly Point, a trained medical scribe. The creation of this document is based on the patient's responses to questions from the provider and provider's statements to the medical scribe.      *Some images could not be shown.

## 2024-05-30 ENCOUNTER — Encounter

## 2024-05-30 ENCOUNTER — Ambulatory Visit: Payer: 59 | Primary: Diagnostic Radiology

## 2024-05-30 ENCOUNTER — Ambulatory Visit: Admit: 2024-05-30 | Payer: 59 | Primary: Diagnostic Radiology

## 2024-05-30 ENCOUNTER — Ambulatory Visit: Admit: 2024-05-30 | Discharge: 2024-05-30 | Payer: 59 | Attending: Nurse Practitioner | Primary: Diagnostic Radiology

## 2024-05-30 NOTE — H&P (View-Only) (Signed)
 [name]      [phone]        Name:  Paul Medina  Date of Birth:  August 01, 1930   MRN: [id number]      Office Visit: 05/30/2024        [facility]     The patient (or guardian, if applicable) and other individuals in attendance with the patient were advised that Artificial Intelligence will be utilized during this visit to record, process the conversation to generate a clinical note, and support improvement of the AI technology. The patient (or guardian, if applicable) and other individuals in attendance at the appointment consented to the use of AI, including the recording.         Assessment & Plan (Medical Decision Making)      Impression: 89 y.o. male     Assessment & Plan  1. Malignant left pleural effusion.  There is a persistent presence of fluid, which is anticipated to recur until the underlying malignancy is adequately managed and controlled. The frequency of fluid drainage is contingent upon the severity of his dyspnea. It is expected that the initiation of chemotherapy will facilitate a reduction in the frequency of necessary fluid drainage. Discussed potentially placing a pleur-x versus repeat t-cent. He lives alone and has no one that could drain the fluid.  Would have to arrange [facility]. Given his transportation difficulties and the likelihood of fluid recurrence until the cancer is treated, and upcoming initiation of chemo, will arrange for t-cent in 2 weeks.  If dyspnea worsens, he is to let us  know and we can move this up.  This time frame is chosen due to his initiation of chemo next week and hospitalization associated with that.    2.  COPD, moderate  This is stable.  Continue Wixela and Spiriva.    3. Large cell lymphoma  Under the care of Dr. Cora    4. Aspiration pneumonia  LLL--S/P treatment with piperacillin -tazobactam during hospitalization and then transitioned to Levaquin.  Has completed treatment.  WBC improved.  Saw speech  therapy during hospitalization and pureed diet with mildly thicken liquids were recommended.  He has fair compliance with these at home.  High risk for recurrent aspiration.      Follow-up and Dispositions    Return in about 6 weeks (around 07/11/2024) for Aaditya Letizia, pleural effusion, FI, Arrive 15 minutes prior to appt time.       Collaborating physician is Dr. Caron Graff.    CV    Dorthea CHRISTELLA Lofty, APRN - CNP    [facility]!!   ALL OUTSIDE REFERRALS/TESTS MUST BE AUTHORIZED.       Total time for encounter on day of encounter was 50 minutes.  This time includes chart prep, review of tests/procedures, review of other provider's notes, documentation and counseling patient regarding disease process and medications.   _________________________________________________________________________    HISTORY OF PRESENT ILLNESS:    Mr. Paul Medina is a 89 y.o. male who is seen at [facility] today for  pleural effusion       History of Present Illness  The patient is a 89 year old male who presents for follow-up of pleural effusion. He has a history of congestive heart failure and diffuse large B-cell lymphoma, initially treated with [facility] chemotherapy about 2 years ago. Approximately a month ago, he experienced worsening left pleural effusion, believed to be related to his congestive heart failure, and was started on Zaroxolyn . However, his  dyspnea worsened, leading to an ER visit at [facility] on 05/14/2024 and ultimately out patient left thoracentesis on 05/20/24. During this visit, he underwent a left thoracentesis with removal of 1500 cc of pleural fluid. The pleural fluid analysis showed an LDH of 2500, protein of 4.3, and cytology revealed diffuse large B-cell lymphoma. He was also treated for aspiration pneumonia with IV antibiotics initially and transitioned to oral Levaquin as his renal function improved, completing a 5-day course. His BNP was 754 on admission, and COPD was considered stable. He is currently  seeing Dr. Cora with oncology and is scheduled to start chemotherapy with Epkinly in the near future.    He reports persistent shortness of breath. He expresses concern about the potential need for biweekly fluid drainage if he proceeds with chemotherapy. He is currently receiving home health services through the [facility].    He reports no difficulty swallowing or choking on food or liquids but notes an inability to consume dry foods such as fish, meat, turkey, chicken, and beef due to esophageal issues.         Thoracenteses  Date:   LEFT RIGHT  DESCRIPTION   05/20/24 1500 cc  + diffuse B cell lymphoma, LDH >2500, protein 4.3           Pleural Fluid Only Analysis (PFO3)  If any one of the 3 are positive, the pleural fluid is considered an exudate:  [x]   Pleural fluid protein greater than 3.0 g/dL (30 g/L)  []   Pleural fluid cholesterol greater than 55 mg/dL (8.575 mmol/L)  [x]   Pleural fluid LDH greater than 0.67 times the upper limit of the laboratory's normal serum LDH of 281 (187)        REVIEW OF SYSTEMS: 10 point review of systems is negative except as reported in HPI.    PHYSICAL EXAM: Body mass index is 26.95 kg/m.  Vitals:    05/30/24 1117   BP: 100/60   Pulse: 92   Temp: 98.4 F (36.9 C)   TempSrc: Infrared   SpO2: 98%   Weight: 75.8 kg (167 lb)   Height: 1.676 m (5' 6)         General:   Alert, cooperative, no distress, appears stated age.        Eyes:   Conjunctivae/corneas clear. PERRL        Mouth/Throat:  Lips, mucosa, and tongue normal. Teeth and gums normal.        Lungs:     Breath sounds decreased on LEFT with dullness to percussion     Heart:   Regular rate and rhythm, S1, S2 normal, no murmur, click, rub or gallop.     Abdomen:    Soft, non-tender.     Extremities:  Extremities normal, atraumatic, no cyanosis or edema.     Skin:  Skin color normal. No rashes or lesions     Neurologic:  A&Ox3     DIAGNOSTIC TESTS:                                                                                     LABS:   Lab Results   Component Value Date/Time  WBC 6.2 07/20/2023 06:25 AM    HGB 8.1 07/20/2023 06:25 AM    HCT 23.4 07/20/2023 06:25 AM    PLT 122 07/20/2023 06:25 AM    CRP 6.8 07/09/2023 11:41 AM     Imaging: I performed an independent interpretation of the patient's images.  CXR:   XR CHEST 1 VIEW 05/20/2024    Narrative  EXAMINATION: XR CHEST 1 VW 05/20/2024 12:56 PM    CLINICAL INDICATION: ;SP thora;    COMPARISON: 05/14/2024    FINDINGS: A single, portable, frontal view is submitted. The heart is not enlarged. Persistent left pleural effusion and adjacent airspace opacity. No pneumothorax. Stable coarse opacities in the right upper lung. There are no acute bony abnormalities.    Impression  No pneumothorax following thoracentesis.      Signed by: 05/20/2024 1:25 PM: Junior MD, Vicenta    CXR--05/30/24--persistent left effusion        CT Chest:   CT CHEST WO CONTRAST 05/14/2024    Narrative  EXAM: CT CHEST : NON-CONTRAST.    HISTORY: Respiratory illness, nondiagnostic xray;    COMPARISON:  1.  CT chest abdomen pelvis 12/12/2023. Chest radiograph 05/14/2024. CT chest 08/07/2023.    TECHNIQUE: Volume acquisition of the chest was obtained from the thoracic inlet to the diaphragm without intravenous contrast. Data was reconstructed at multiple thicknesses. Axial, sagittal, and coronal images were constructed and reviewed.    FINDINGS:    Lungs - Pleura:  There is a large left pleural effusion. There is minimal left upper lobe atelectasis. There is multi segmental atelectasis involving the left lower lobe. There is new high density branching material within the left lower lobe of the lung suggesting aspiration.    There is persistent multifocal consolidation within the right lung which appears similar to the most recent prior CT but improved from the CT of 08/07/2023.    Hila - Mediastinum:  There is no mediastinal or hilar lymphadenopathy.    Heart - Vascular:  The heart is normal in size. There is no  significant pericardial effusion. Coronary artery calcifications are noted. The aorta is normal in caliber.    Upper Abdomen:  There is no acute finding within the visualized portion the upper abdomen. There is a stable indeterminate left adrenal nodule. There is also a stable cystic lesion within the pancreatic tail.    Soft tissues - MSK:  Degenerative changes are noted within the spine. There are no aggressive osseous lesions or acute fractures.    Impression  1. There is a new large left pleural effusion with multisegment atelectasis involving the left lower lobe.  2. There is new branching high density material within the atelectatic left lower lobe suggesting aspirated material.  3. There is multifocal nonspecific consolidation within the right lung which is similar to the CT chest of 12/12/2023 but improved from 08/07/2023.    Signed by: 05/14/2024 6:03 PM: Demartini MD, Mabel    Nuclear Medicine: No results found for this or any previous visit from the past 3650 days.    PFTs:       Latest Ref Rng & Units 05/11/2023    12:00 AM   Office Spirometry Results   FVC L 2.33    FEV1 L 1.39    FEV1 %Pred-Pre % 62    FVC %Pred-Pre % 75    FEV1/FVC % 60      No results found for this or any previous visit. No results found for this or any previous  visit.    FeNO: No results found for this or any previous visit.  FeNO and Likelihood of Eosinophilic Asthma   Unlikely Intermediate Likely   <25 ppb 25-50 ppb >50ppb     Exercise Oximetry:    Echo:   ECHO (TTE) COMPLETE (PRN CONTRAST/BUBBLE/STRAIN/3D) 06/29/2023    Interpretation Summary    Left Ventricle: Moderately reduced left ventricular systolic function with a visually estimated EF of 30 - 35%. Left ventricle is moderately dilated. Normal wall thickness. Grade II diastolic dysfunction with increased LAP.    Aortic Valve: Mild regurgitation. Mild stenosis of the aortic valve (DI ~0.44).    Mitral Valve: Mild to moderate regurgitation with a posterior directed jet.     Left Atrium: Left atrium is mildly dilated.    Image quality is adequate. Contrast used: Lumason .    PMH Reference Info:                                                                                                                Immunization History   Administered Date(s) Administered    COVID-19, Inactive, MODERNA BLUE border, Primary or Immunocompromised, (age 12y+) 06/05/2019, 07/03/2019, 03/30/2020    COVID-19, Inactive, MODERNA Bivalent, (age 12y+) 03/29/2021    COVID-19, SPIKEVAX Laren), (age 12y+), IM, 36mcg/0.5mL 02/15/2023    Influenza A (H1n1-09),all Formulations 05/23/2008    Influenza Virus Vaccine 12/28/1999, 03/31/2001, 03/23/2002, 03/06/2004, 03/23/2005, 03/13/2006, 02/21/2007, 01/18/2008, 02/20/2009, 02/12/2010, 03/24/2011, 03/23/2012, 02/23/2013, 04/07/2015, 02/24/2017, 02/21/2019    Influenza, FLUAD, (age 58 y+), IM, Quadv, 0.11mL 02/24/2020, 03/15/2021    Influenza, FLUAD, (age 55 y+), IM, Trivalent PF, 0.5mL 03/07/2018    Influenza, FLUZONE High Dose, (age 73 y+), IM, Trivalent PF, 0.15mL 02/20/2014, 03/24/2015, 03/26/2016, 01/21/2017    Pneumococcal Vaccine 05/23/1997, 12/28/1999, 11/30/2011    Pneumococcal, PCV-13, PREVNAR 13, (age 6w+), IM, 0.5mL 11/30/2011, 05/30/2014    Pneumococcal, PPSV23, PNEUMOVAX 23, (age 2y+), SC/IM, 0.84mL 06/01/2016, 06/01/2016    TDaP, ADACEL (age 68y-64y), BOOSTRIX (age 10y+), IM, 0.78mL 05/29/2013, 03/20/2017    Td vaccine (adult) 01/15/2003    Tetanus 01/15/2003    Zoster Live (Zostavax) 12/22/2006     Past Medical History:   Diagnosis Date    Arthritis     Asthma     Chronic bronchitis (HCC)     Hypertension     Lung cancer (HCC)         Tobacco Use      Smoking status: Former        Packs/day: 0.00        Years: 1 pack/day for 34.0 years (34.0 ttl pk-yrs)        Types: Cigarettes        Start date: 05/31/1954        Quit date: 1990        Years since quitting: 36.0      Smokeless tobacco: Never      Tobacco comments: Smoked a pack a day for about 46 years.   Quit about 2001    Allergies  Allergen Reactions    Ciprofloxacin Other (See Comments)     Other Reaction(s): Joint swelling-Intolerance    Doxycycline  Nausea And Vomiting and Nausea Only     Other Reaction(s): GI Intolerance    Lisinopril Cough and Other (See Comments)     Other Reaction(s): Cough, Cough-Intolerance, Other (See Comments)    cough    cough   cough   cough    Other Other (See Comments) and Nausea And Vomiting     Causes sneezing, itchy/runny nose    Other Reaction(s): GI Intolerance    unknown    unknown, unknown    unknown   unknown    Causes sneezing, itchy/runny nose   Causes sneezing, itchy/runny nose   Causes sneezing, itchy/runny nose    Sulfa Antibiotics Nausea And Vomiting, Other (See Comments) and Rash     Other Reaction(s): GI Intolerance, Other- (not listed) - Allergy, Unknown    Unknown    Sulfasalazine Rash    Sulfur  Itching and Nausea Only     Current Outpatient Medications   Medication Instructions    albuterol  sulfate HFA (PROVENTIL ;VENTOLIN ;PROAIR ) 108 (90 Base) MCG/ACT inhaler 2 puffs, Inhalation, EVERY 4 HOURS PRN    allopurinol (ZYLOPRIM) 300 mg    apixaban  (ELIQUIS ) 2.5 mg, Oral, 2 TIMES DAILY    ascorbic acid (VITAMIN C) 500 mg, DAILY    atropine  1 % ophthalmic solution 1 drop, 2 TIMES DAILY    brimonidine  (ALPHAGAN  P) 0.15 % ophthalmic solution 1 drop, 3 TIMES DAILY    carboxymethylcellulose PF (REFRESH PLUS) 0.5 % SOLN ophthalmic solution 1 drop, 4 TIMES DAILY PRN    fluticasone -salmeterol (WIXELA INHUB ) 250-50 MCG/ACT AEPB diskus inhaler 1 puff, Inhalation, EVERY 12 HOURS    furosemide  (LASIX ) 40 mg, Oral, DAILY    ipratropium 0.5 mg-albuterol  2.5 mg (DUONEB ) 0.5-2.5 (3) MG/3ML SOLN nebulizer solution 3 mLs, Inhalation, 4 TIMES DAILY PRN, Diagnosis J44.9    lactobacillus (CULTURELLE) CAPS capsule 1 capsule, DAILY    latanoprost (XALATAN) 0.005 % ophthalmic solution 1 drop    lidocaine  (LIDODERM ) 5 % 1 patch, DAILY    metOLazone  (ZAROXOLYN ) 2.5 mg, Oral, EVERY 72 HOURS     metoprolol  succinate (TOPROL  XL) 25 MG extended release tablet Take by mouth    Multiple Vitamin (MULTIVITAMIN) tablet 1 tablet, DAILY    prednisoLONE  acetate (PRED FORTE ) 1 % ophthalmic suspension 1 drop, 2 TIMES DAILY    rosuvastatin  (CRESTOR ) 30 mg, DAILY    tiotropium bromide  (SPIRIVA RESPIMAT) 2.5 MCG/ACT AERS inhaler 2 puffs, Inhalation, DAILY RESP    tiotropium-olodaterol (STIOLTO) 2.5-2.5 MCG/ACT AERS 2 puffs, Inhalation, DAILY    Vitamin D-1000 Max St 1,000 Units, DAILY

## 2024-05-30 NOTE — Progress Notes (Signed)
 Paul Medina [facility]      [phone]        Name:  Paul Medina  Date of Birth:  August 01, 1930   MRN: [id number]      Office Visit: 05/30/2024        [facility]     The patient (or guardian, if applicable) and other individuals in attendance with the patient were advised that Artificial Intelligence will be utilized during this visit to record, process the conversation to generate a clinical note, and support improvement of the AI technology. The patient (or guardian, if applicable) and other individuals in attendance at the appointment consented to the use of AI, including the recording.         Assessment & Plan (Medical Decision Making)      Impression: 89 y.o. male     Assessment & Plan  1. Malignant left pleural effusion.  There is a persistent presence of fluid, which is anticipated to recur until the underlying malignancy is adequately managed and controlled. The frequency of fluid drainage is contingent upon the severity of his dyspnea. It is expected that the initiation of chemotherapy will facilitate a reduction in the frequency of necessary fluid drainage. Discussed potentially placing a pleur-x versus repeat t-cent. He lives alone and has no one that could drain the fluid.  Would have to [facility]. Given his transportation difficulties and the likelihood of fluid recurrence until the cancer is treated, and upcoming initiation of chemo, will arrange for t-cent in 2 weeks.  If dyspnea worsens, he is to let us  know and we can move this up.  This time frame is chosen due to his initiation of chemo next week and hospitalization associated with that.    2.  COPD, moderate  This is stable.  Continue Wixela and Spiriva.    3. Large cell lymphoma  Under the care of Dr. Cora    4. Aspiration pneumonia  LLL--S/P treatment with piperacillin -tazobactam during hospitalization and then transitioned to Levaquin.  Has completed treatment.  WBC improved.  Saw speech  therapy during hospitalization and pureed diet with mildly thicken liquids were recommended.  He has fair compliance with these at home.  High risk for recurrent aspiration.      Follow-up and Dispositions    Return in about 6 weeks (around 07/11/2024) for Aaditya Letizia, pleural effusion, FI, Arrive 15 minutes prior to appt time.       Collaborating physician is Dr. Caron Graff.    CV    Dorthea CHRISTELLA Lofty, APRN - CNP    [facility]!!   ALL OUTSIDE REFERRALS/TESTS MUST BE AUTHORIZED.       Total time for encounter on day of encounter was 50 minutes.  This time includes chart prep, review of tests/procedures, review of other provider's notes, documentation and counseling patient regarding disease process and medications.   _________________________________________________________________________    HISTORY OF PRESENT ILLNESS:    Mr. Medina Paul is a 89 y.o. male who is seen at [facility] today for  pleural effusion       History of Present Illness  The patient is a 89 year old male who presents for follow-up of pleural effusion. He has a history of congestive heart failure and diffuse large B-cell lymphoma, initially treated with [facility] chemotherapy about 2 years ago. Approximately a month ago, he experienced worsening left pleural effusion, believed to be related to his congestive heart failure, and was started on Zaroxolyn . However, his  dyspnea worsened, leading to an ER visit at [facility] on 05/14/2024 and ultimately out patient left thoracentesis on 05/20/24. During this visit, he underwent a left thoracentesis with removal of 1500 cc of pleural fluid. The pleural fluid analysis showed an LDH of 2500, protein of 4.3, and cytology revealed diffuse large B-cell lymphoma. He was also treated for aspiration pneumonia with IV antibiotics initially and transitioned to oral Levaquin as his renal function improved, completing a 5-day course. His BNP was 754 on admission, and COPD was considered stable. He is currently  seeing Dr. Cora with oncology and is scheduled to start chemotherapy with Epkinly in the near future.    He reports persistent shortness of breath. He expresses concern about the potential need for biweekly fluid drainage if he proceeds with chemotherapy. He is currently receiving home health services through the [facility].    He reports no difficulty swallowing or choking on food or liquids but notes an inability to consume dry foods such as fish, meat, turkey, chicken, and beef due to esophageal issues.         Thoracenteses  Date:   LEFT RIGHT  DESCRIPTION   05/20/24 1500 cc  + diffuse B cell lymphoma, LDH >2500, protein 4.3           Pleural Fluid Only Analysis (PFO3)  If any one of the 3 are positive, the pleural fluid is considered an exudate:  [x]   Pleural fluid protein greater than 3.0 g/dL (30 g/L)  []   Pleural fluid cholesterol greater than 55 mg/dL (8.575 mmol/L)  [x]   Pleural fluid LDH greater than 0.67 times the upper limit of the laboratory's normal serum LDH of 281 (187)        REVIEW OF SYSTEMS: 10 point review of systems is negative except as reported in HPI.    PHYSICAL EXAM: Body mass index is 26.95 kg/m.  Vitals:    05/30/24 1117   BP: 100/60   Pulse: 92   Temp: 98.4 F (36.9 C)   TempSrc: Infrared   SpO2: 98%   Weight: 75.8 kg (167 lb)   Height: 1.676 m (5' 6)         General:   Alert, cooperative, no distress, appears stated age.        Eyes:   Conjunctivae/corneas clear. PERRL        Mouth/Throat:  Lips, mucosa, and tongue normal. Teeth and gums normal.        Lungs:     Breath sounds decreased on LEFT with dullness to percussion     Heart:   Regular rate and rhythm, S1, S2 normal, no murmur, click, rub or gallop.     Abdomen:    Soft, non-tender.     Extremities:  Extremities normal, atraumatic, no cyanosis or edema.     Skin:  Skin color normal. No rashes or lesions     Neurologic:  A&Ox3     DIAGNOSTIC TESTS:                                                                                     LABS:   Lab Results   Component Value Date/Time  WBC 6.2 07/20/2023 06:25 AM    HGB 8.1 07/20/2023 06:25 AM    HCT 23.4 07/20/2023 06:25 AM    PLT 122 07/20/2023 06:25 AM    CRP 6.8 07/09/2023 11:41 AM     Imaging: I performed an independent interpretation of the patient's images.  CXR:   XR CHEST 1 VIEW 05/20/2024    Narrative  EXAMINATION: XR CHEST 1 VW 05/20/2024 12:56 PM    CLINICAL INDICATION: ;SP thora;    COMPARISON: 05/14/2024    FINDINGS: A single, portable, frontal view is submitted. The heart is not enlarged. Persistent left pleural effusion and adjacent airspace opacity. No pneumothorax. Stable coarse opacities in the right upper lung. There are no acute bony abnormalities.    Impression  No pneumothorax following thoracentesis.      Signed by: 05/20/2024 1:25 PM: Junior MD, Vicenta    CXR--05/30/24--persistent left effusion        CT Chest:   CT CHEST WO CONTRAST 05/14/2024    Narrative  EXAM: CT CHEST : NON-CONTRAST.    HISTORY: Respiratory illness, nondiagnostic xray;    COMPARISON:  1.  CT chest abdomen pelvis 12/12/2023. Chest radiograph 05/14/2024. CT chest 08/07/2023.    TECHNIQUE: Volume acquisition of the chest was obtained from the thoracic inlet to the diaphragm without intravenous contrast. Data was reconstructed at multiple thicknesses. Axial, sagittal, and coronal images were constructed and reviewed.    FINDINGS:    Lungs - Pleura:  There is a large left pleural effusion. There is minimal left upper lobe atelectasis. There is multi segmental atelectasis involving the left lower lobe. There is new high density branching material within the left lower lobe of the lung suggesting aspiration.    There is persistent multifocal consolidation within the right lung which appears similar to the most recent prior CT but improved from the CT of 08/07/2023.    Hila - Mediastinum:  There is no mediastinal or hilar lymphadenopathy.    Heart - Vascular:  The heart is normal in size. There is no  significant pericardial effusion. Coronary artery calcifications are noted. The aorta is normal in caliber.    Upper Abdomen:  There is no acute finding within the visualized portion the upper abdomen. There is a stable indeterminate left adrenal nodule. There is also a stable cystic lesion within the pancreatic tail.    Soft tissues - MSK:  Degenerative changes are noted within the spine. There are no aggressive osseous lesions or acute fractures.    Impression  1. There is a new large left pleural effusion with multisegment atelectasis involving the left lower lobe.  2. There is new branching high density material within the atelectatic left lower lobe suggesting aspirated material.  3. There is multifocal nonspecific consolidation within the right lung which is similar to the CT chest of 12/12/2023 but improved from 08/07/2023.    Signed by: 05/14/2024 6:03 PM: Demartini MD, Mabel    Nuclear Medicine: No results found for this or any previous visit from the past 3650 days.    PFTs:       Latest Ref Rng & Units 05/11/2023    12:00 AM   Office Spirometry Results   FVC L 2.33    FEV1 L 1.39    FEV1 %Pred-Pre % 62    FVC %Pred-Pre % 75    FEV1/FVC % 60      No results found for this or any previous visit. No results found for this or any previous  visit.    FeNO: No results found for this or any previous visit.  FeNO and Likelihood of Eosinophilic Asthma   Unlikely Intermediate Likely   <25 ppb 25-50 ppb >50ppb     Exercise Oximetry:    Echo:   ECHO (TTE) COMPLETE (PRN CONTRAST/BUBBLE/STRAIN/3D) 06/29/2023    Interpretation Summary    Left Ventricle: Moderately reduced left ventricular systolic function with a visually estimated EF of 30 - 35%. Left ventricle is moderately dilated. Normal wall thickness. Grade II diastolic dysfunction with increased LAP.    Aortic Valve: Mild regurgitation. Mild stenosis of the aortic valve (DI ~0.44).    Mitral Valve: Mild to moderate regurgitation with a posterior directed jet.     Left Atrium: Left atrium is mildly dilated.    Image quality is adequate. Contrast used: Lumason .    PMH Reference Info:                                                                                                                Immunization History   Administered Date(s) Administered    COVID-19, Inactive, MODERNA BLUE border, Primary or Immunocompromised, (age 12y+) 06/05/2019, 07/03/2019, 03/30/2020    COVID-19, Inactive, MODERNA Bivalent, (age 12y+) 03/29/2021    COVID-19, SPIKEVAX Laren), (age 12y+), IM, 36mcg/0.5mL 02/15/2023    Influenza A (H1n1-09),all Formulations 05/23/2008    Influenza Virus Vaccine 12/28/1999, 03/31/2001, 03/23/2002, 03/06/2004, 03/23/2005, 03/13/2006, 02/21/2007, 01/18/2008, 02/20/2009, 02/12/2010, 03/24/2011, 03/23/2012, 02/23/2013, 04/07/2015, 02/24/2017, 02/21/2019    Influenza, FLUAD, (age 58 y+), IM, Quadv, 0.11mL 02/24/2020, 03/15/2021    Influenza, FLUAD, (age 55 y+), IM, Trivalent PF, 0.5mL 03/07/2018    Influenza, FLUZONE High Dose, (age 73 y+), IM, Trivalent PF, 0.15mL 02/20/2014, 03/24/2015, 03/26/2016, 01/21/2017    Pneumococcal Vaccine 05/23/1997, 12/28/1999, 11/30/2011    Pneumococcal, PCV-13, PREVNAR 13, (age 6w+), IM, 0.5mL 11/30/2011, 05/30/2014    Pneumococcal, PPSV23, PNEUMOVAX 23, (age 2y+), SC/IM, 0.84mL 06/01/2016, 06/01/2016    TDaP, ADACEL (age 68y-64y), BOOSTRIX (age 10y+), IM, 0.78mL 05/29/2013, 03/20/2017    Td vaccine (adult) 01/15/2003    Tetanus 01/15/2003    Zoster Live (Zostavax) 12/22/2006     Past Medical History:   Diagnosis Date    Arthritis     Asthma     Chronic bronchitis (HCC)     Hypertension     Lung cancer (HCC)         Tobacco Use      Smoking status: Former        Packs/day: 0.00        Years: 1 pack/day for 34.0 years (34.0 ttl pk-yrs)        Types: Cigarettes        Start date: 05/31/1954        Quit date: 1990        Years since quitting: 36.0      Smokeless tobacco: Never      Tobacco comments: Smoked a pack a day for about 46 years.   Quit about 2001    Allergies  Allergen Reactions    Ciprofloxacin Other (See Comments)     Other Reaction(s): Joint swelling-Intolerance    Doxycycline  Nausea And Vomiting and Nausea Only     Other Reaction(s): GI Intolerance    Lisinopril Cough and Other (See Comments)     Other Reaction(s): Cough, Cough-Intolerance, Other (See Comments)    cough    cough   cough   cough    Other Other (See Comments) and Nausea And Vomiting     Causes sneezing, itchy/runny nose    Other Reaction(s): GI Intolerance    unknown    unknown, unknown    unknown   unknown    Causes sneezing, itchy/runny nose   Causes sneezing, itchy/runny nose   Causes sneezing, itchy/runny nose    Sulfa Antibiotics Nausea And Vomiting, Other (See Comments) and Rash     Other Reaction(s): GI Intolerance, Other- (not listed) - Allergy, Unknown    Unknown    Sulfasalazine Rash    Sulfur  Itching and Nausea Only     Current Outpatient Medications   Medication Instructions    albuterol  sulfate HFA (PROVENTIL ;VENTOLIN ;PROAIR ) 108 (90 Base) MCG/ACT inhaler 2 puffs, Inhalation, EVERY 4 HOURS PRN    allopurinol (ZYLOPRIM) 300 mg    apixaban  (ELIQUIS ) 2.5 mg, Oral, 2 TIMES DAILY    ascorbic acid (VITAMIN C) 500 mg, DAILY    atropine  1 % ophthalmic solution 1 drop, 2 TIMES DAILY    brimonidine  (ALPHAGAN  P) 0.15 % ophthalmic solution 1 drop, 3 TIMES DAILY    carboxymethylcellulose PF (REFRESH PLUS) 0.5 % SOLN ophthalmic solution 1 drop, 4 TIMES DAILY PRN    fluticasone -salmeterol (WIXELA INHUB ) 250-50 MCG/ACT AEPB diskus inhaler 1 puff, Inhalation, EVERY 12 HOURS    furosemide  (LASIX ) 40 mg, Oral, DAILY    ipratropium 0.5 mg-albuterol  2.5 mg (DUONEB ) 0.5-2.5 (3) MG/3ML SOLN nebulizer solution 3 mLs, Inhalation, 4 TIMES DAILY PRN, Diagnosis J44.9    lactobacillus (CULTURELLE) CAPS capsule 1 capsule, DAILY    latanoprost (XALATAN) 0.005 % ophthalmic solution 1 drop    lidocaine  (LIDODERM ) 5 % 1 patch, DAILY    metOLazone  (ZAROXOLYN ) 2.5 mg, Oral, EVERY 72 HOURS     metoprolol  succinate (TOPROL  XL) 25 MG extended release tablet Take by mouth    Multiple Vitamin (MULTIVITAMIN) tablet 1 tablet, DAILY    prednisoLONE  acetate (PRED FORTE ) 1 % ophthalmic suspension 1 drop, 2 TIMES DAILY    rosuvastatin  (CRESTOR ) 30 mg, DAILY    tiotropium bromide  (SPIRIVA RESPIMAT) 2.5 MCG/ACT AERS inhaler 2 puffs, Inhalation, DAILY RESP    tiotropium-olodaterol (STIOLTO) 2.5-2.5 MCG/ACT AERS 2 puffs, Inhalation, DAILY    Vitamin D-1000 Max St 1,000 Units, DAILY

## 2024-06-04 ENCOUNTER — Encounter: Payer: 59 | Attending: Critical Care Medicine | Primary: Diagnostic Radiology

## 2024-06-12 NOTE — Progress Notes (Signed)
 RN called Pt to confirm appointment time of 1300, arrival time 1200, location, driver requirement, and instructions for registration at the hospital.  Pt verbalized understanding.

## 2024-06-12 NOTE — Telephone Encounter (Signed)
 Notified by front office that patient has called with instructions for arrival tomorrow's thoracentesis.  He confirms he has held Eliquis .  He is aware of 1300 arrival at main admitting office. He voices appreciation for return call.

## 2024-06-13 ENCOUNTER — Inpatient Hospital Stay: Payer: 59 | Attending: Internal Medicine

## 2024-06-13 NOTE — Op Note (Signed)
 PROCEDURE: THERAPEUTIC THORACENTESIS (CPT  .       PRE-OP DIAGNOSIS:   LEFT  PLEURAL EFFUSION    POST-OP DIAGNOSIS:   LEFT  PLEURAL EFFUSION    VOLUME REMOVED:    600cc    ANESTHESIA:    LOCAL ANESTHESIA WITH 1% LIDOCAINE  10 CC TOTAL.      CHEST ULTRASOUND FINDINGS:    A Turbo-M, Sonosite ultrasound with a 5-16 mHz probe was used to image the chest and localize the pleural effusion on the left chest.    A moderate anechoic space was seen consistent with an uncomplicated pleural effusion.      DESCRIPTION OF PROCEDURE:    After obtaining informed consent and localizing the safest location for thoracentesis, the  8th intercostal space was marked with a blunt, plastic needle cap in the mid scapular line.    An [employer]ark AK-0100 Pleral-Seal thoracentesis kit was used to perform the procedure.    The skin was cleansed with the supplied chlorhexidine swab and then draped in the usual fashion.    Using the previously marked location as a guide, a 22 G 1.5 inch needle was used to inject 1% lidocaine  into the skin and subcutaneous tissue, as well as onto the underlying rib and inter-costal muscles.  Pleural fluid was aspirated to assure proper location and additional lidocaine  was injected into the pleural space prior to removing the anesthesia needle.    A 3mm incision was then made with the supplied scalpel in the usual fashion to facilitate the insertion of the thoracentesis needle.    The needle with an 8 French thoracentesis catheter was then introduced into the chest through the previously made incision in the usual fashion, the rib localized with the needle, and the catheter then marched over the rib into the pleural space.    After aspirating fluid, the thoracentesis catheter was then placed into the chest using the needle itself as a trocar.  The needle was then removed and the catheter was attached to the supplied tubing without complication.    600 cc of serosanguinous fluid was aspirated.   No analysis was sent  as this was recently done and showed lymphomatous cells.         Following drainage the catheter was removed, a bandage was applied and US  confirmed incomplete drainage of the effusion and presence of lung sliding, ruling out pneumothorax. [phone]    EBL:     <5cc    COMPLICATIONS:    Despite cessation of drainage there was significant fluid remaining. Possible trapped lung and loculated effusion though there were no definite loculations visible on US .   Will plan to keep existing follow up in February with our office and he is about to start chemotherapy for lymphoma. If fluid reaccumulates rapidly would be in favor of pleurx placement if he feels he can manage this at home.       Caron JINNY Graff, MD

## 2024-06-13 NOTE — Discharge Instructions (Signed)
 Follow up with your oncologist and [facility]. Keep area clean/dry, do not submerge in any water  for 3 days.      Thoracentesis: What to Expect at Home  Your Recovery  Thoracentesis (say thor-uh-sen-TEE-sis) is a procedure to remove fluid from the space between the lungs and the chest wall (pleural space). This procedure may also be called a chest tap. It's normal to have a small amount of fluid in the pleural space. But too much fluid can build up because of problems such as infection, heart failure, or lung cancer. The procedure may have been done to help with shortness of breath and pain caused by the fluid buildup. Or you may have had this procedure so the doctor could test the fluid to find the cause of the buildup.  Your chest may be sore where the doctor put the needle or catheter into your skin (the puncture site). This usually gets better after a day or two. You can go back to work or your normal activities as soon as you feel up to it.  If a large amount of pleural fluid was removed during the procedure, you will probably be able to breathe more easily.  If more pleural fluid collects and needs to be removed, another thoracentesis may be done later.  This care sheet gives you a general idea about how long it will take for you to recover. But each person recovers at a different pace. Follow the steps below to feel better as quickly as possible.  How can you care for yourself at home?  Activity    Rest when you feel tired. Getting enough sleep will help you recover.     Avoid strenuous activities, such as bicycle riding, jogging, weight lifting, or aerobic exercise, until your doctor says it is okay.     You may shower. Do not take a bath until the puncture site has healed, or until your doctor tells you it is okay.     Ask your doctor when you can drive again.     You may need to take 1 or 2 days off from work. It depends on the type of work you do and how you feel.   Diet    You can eat your  normal diet.     Drink plenty of fluids (unless your doctor tells you not to).   Medicines    Your doctor will tell you if and when you can restart your medicines. You will also get instructions about taking any new medicines.     If you stopped taking aspirin  or some other blood thinner, your doctor will tell you when to start taking it again.     Be safe with medicines. Take pain medicines exactly as directed.  If you are not taking a prescription pain medicine, ask your doctor if you can take an over-the-counter medicine.  If the doctor gave you a prescription medicine for pain, take it as prescribed.  Store your prescription pain medicines where no one else can get to them. When you are done using them, dispose of them quickly and safely. Your local pharmacy or hospital may have a drop-off site.     If you think your pain medicine is making you sick to your stomach:  Take your medicine after meals (unless your doctor has told you not to).  Ask your doctor for a different pain medicine.     If your doctor prescribed antibiotics, take them as directed. Do not  stop taking them just because you feel better. You need to take the full course of antibiotics.   Care of the puncture site    Wash the area daily with warm, soapy water , and pat it dry. Don't use hydrogen peroxide or alcohol, which may delay healing. You may cover the area with a gauze bandage if it weeps or rubs against clothing. Change the bandage every day.     Keep the area clean and dry.   Follow-up care is a key part of your treatment and safety. Be sure to make and go to all appointments, and call your doctor if you are having problems. It's also a good idea to know your test results and keep a list of the medicines you take.  When should you call for help?   Call 911 anytime you think you may need emergency care. For example, call if:    You passed out (lost consciousness).     You have severe trouble breathing.     You have sudden chest pain and  shortness of breath, or you cough up blood.   Call your doctor now or seek immediate medical care if:    You have shortness of breath that is new or getting worse.     You have new or worse pain in your chest, especially when you take a deep breath.     You are sick to your stomach or cannot keep fluids down.     You have a fever over 100F.     Bright red blood has soaked through the bandage over your puncture site.     You have signs of infection, such as:  Increased pain, swelling, warmth, or redness.  Red streaks leading from the puncture site.  Pus draining from the puncture site.  Swollen lymph nodes in your neck, armpits, or groin.  A fever.     You cough up a lot more mucus than normal, or your mucus changes color.   Watch closely for changes in your health, and be sure to contact your doctor if you have any problems.  Where can you learn more?  Go to [employer] and enter Q755 to learn more about Thoracentesis: What to Expect at Home.  Current as of: December 21, 2022  Content Version: 14.6   2024-2025 [place], [place].   Care instructions adapted under license by [facility]. If you have questions about a medical condition or this instruction, always ask your healthcare professional. Romayne Alderman, [facility], disclaims any warranty or liability for your use of this information.

## 2024-06-13 NOTE — Progress Notes (Addendum)
 US /thoracentesis with Dr Jestine. 600 cc drained from left side, drainage not completed. NO specimens sent. Follow up with [facility] scheduled, DC instructions reviewed. VSS on room air, no pain or SOB.     Taken out by nurse in W/C to [facility].

## 2024-06-13 NOTE — Interval H&P Note (Signed)
 Update History & Physical    The patient's History and Physical of January 8,  was reviewed with the patient and I examined the patient. There was no change. The surgical site was confirmed by the patient and me.     Plan: The risks, benefits, expected outcome, and alternative to the recommended procedure have been discussed with the patient. Patient understands and wants to proceed with the procedure.     Electronically signed by Caron JINNY Graff, MD on 06/14/2024 at 4:36 PM

## 2024-06-19 ENCOUNTER — Inpatient Hospital Stay
Admit: 2024-06-19 | Discharge: 2024-06-19 | Disposition: A | Discharge status: Home / Self Care | Payer: 59 | Arrived: VH | Attending: Emergency Medicine

## 2024-06-19 DIAGNOSIS — N3000 Acute cystitis without hematuria: Principal | ICD-10-CM

## 2024-06-19 MED ORDER — ONDANSETRON 4 MG PO TBDP
4 | ORAL_TABLET | Freq: Three times a day (TID) | ORAL | 0 refills | Status: AC | PRN
Start: 2024-06-19 — End: ?

## 2024-06-19 MED ORDER — SULFAMETHOXAZOLE-TRIMETHOPRIM 800-160 MG PO TABS
800-160 | ORAL_TABLET | Freq: Two times a day (BID) | ORAL | 0 refills | Status: AC
Start: 2024-06-19 — End: 2024-06-24

## 2024-06-19 NOTE — ED Notes (Signed)
 Patient mobility status ambulates with no difficulty.     I have reviewed discharge instructions with the patient.  The patient verbalized understanding.    Patient left ED via Discharge Method: ambulatory with walker to Home with self.    Opportunity for questions and clarification provided.     Patient given 2 scripts.

## 2024-06-19 NOTE — Discharge Instructions (Signed)
 Monitor for any rash development with your new antibiotic.  Try to keep taking unless the rash becomes unbearable or you develop any facial swelling.

## 2024-06-19 NOTE — ED Triage Notes (Signed)
 Patient advises that he was seen recently for UTI and was started on antibiotics, patient advises he received a phone call today and was told he is on wrong antibiotics. Patient unsure if they had a culture and needed to change medications or why the change was needed. Patient is Cancer with PET scan performed this morning.

## 2024-06-19 NOTE — ED Provider Notes (Signed)
 Emergency Department Provider Note       [facility] EMERGENCY DEPT   PCP: Unknown, Provider   Age: 89 y.o.   Sex: male     DISPOSITION Decision To Discharge 06/19/2024 10:53:19 AM    ICD-10-CM    1. Acute cystitis without hematuria  N30.00           Medical Decision Making     Patient with recent UTI diagnosed at urgent care.  Culture came back today showing Staph aureus.  It is MRSA based on culture results.  He needs to be switched to Bactrim .  Has had a rash with Bactrim  in the past.  Will treat only 5 days and have patient monitor rash and try to complete the antibiotics but if getting worse will return.  Drawing blood cultures to check for any spread of the bacteria to his blood.  Patient is asymptomatic here with no fever and good vitals.  Will discharge with outpatient follow-up.     I independently ordered and reviewed each unique test.    Prescription drug management performed.  Patient was discharged risks and benefits of hospitalization were considered.  Shared medical decision making was utilized in creating the patients health plan today.  I reviewed external records: provider visit note from PCP.                     History     Patient with recent UTI.  Diagnosed at urgent care.  Culture grew Staph aureus.  Patient needs a switch in his antibiotic.  Told to come to the ER.  He has no symptoms.  Vitals are good here.    The history is provided by the patient. No language interpreter was used.       ROS     Review of Systems   Constitutional:  Negative for chills, fatigue and fever.   Respiratory:  Negative for cough and shortness of breath.    Cardiovascular:  Negative for chest pain and palpitations.   Gastrointestinal:  Negative for abdominal pain, constipation, diarrhea, nausea and vomiting.   Genitourinary:  Negative for dysuria and hematuria.   Musculoskeletal:  Negative for back pain and neck pain.   Skin:  Negative for color change and rash.   Neurological:  Negative for numbness and headaches.   All  other systems reviewed and are negative.       Physical Exam     Vitals signs and nursing note reviewed:  Vitals:    06/19/24 1035   BP: 118/79   Pulse: 86   Resp: 16   Temp: 97.5 F (36.4 C)   TempSrc: Oral   SpO2: 100%   Weight: 74.8 kg (165 lb)   Height: 1.676 m (5' 6)      Physical Exam  Vitals and nursing note reviewed.   Constitutional:       Appearance: Normal appearance.   HENT:      Head: Normocephalic and atraumatic.   Cardiovascular:      Rate and Rhythm: Normal rate and regular rhythm.   Pulmonary:      Effort: Pulmonary effort is normal.      Breath sounds: Normal breath sounds. No wheezing.   Abdominal:      General: Bowel sounds are normal.      Palpations: Abdomen is soft.      Tenderness: There is no abdominal tenderness.   Musculoskeletal:         General: No swelling. Normal range  of motion.   Skin:     General: Skin is warm and dry.   Neurological:      Mental Status: He is alert.        Procedures     Procedures    Orders placed during this emergency department visit:     Orders Placed This Encounter   Procedures    Culture, Blood 1    Culture, Blood 1        Medications given during this emergency department visit:   Medications - No data to display    New prescriptions:     New Prescriptions    SULFAMETHOXAZOLE -TRIMETHOPRIM  (BACTRIM  DS;SEPTRA  DS) 800-160 MG PER TABLET    Take 1 tablet by mouth 2 times daily for 5 days        Past History and Complexity:     Past Medical History:   Diagnosis Date    Arthritis     Asthma     Chronic bronchitis (HCC)     Hypertension     Lung cancer [facility])         Past Surgical History:   Procedure Laterality Date    THORACENTESIS Left 06/13/2024    THORACENTESIS ULTRASOUND in endo 2 copy to Mrs Julieann performed by Jestine Caron PARAS, MD at [facility] ENDOSCOPY    UPPER GASTROINTESTINAL ENDOSCOPY N/A 07/18/2023    ESOPHAGOGASTRODUODENOSCOPY BIOPSY/balloon dilation performed by Emelia Okey Barter, MD at [facility] ENDOSCOPY        Social History     Socioeconomic History    Marital  status: Single    Number of children: 2   Occupational History    Occupation: Lives in the retirement community   Tobacco Use    Smoking status: Former     Current packs/day: 0.00     Average packs/day: 1 pack/day for 34.0 years (34.0 ttl pk-yrs)     Types: Cigarettes     Start date: 05/31/1954     Quit date: 1990     Years since quitting: 36.0    Smokeless tobacco: Never    Tobacco comments:     Smoked a pack a day for about 46 years.  Quit about 2001   Vaping Use    Vaping status: Never Used   Substance and Sexual Activity    Alcohol use: Never    Drug use: Never    Sexual activity: Not Currently     Partners: Female   Social History Narrative    Has no pets.    We really drinks any caffeine     Social Drivers of Psych[employer], [employer] Strain: Patient Declined (05/15/2024)    Received from [facility] - Financial Strain     How hard is it for you to pay for the very basics like food, housing, medical care, and heating? Would you say it is:: Patient declined   Food Insecurity: Patient Declined (05/15/2024)    Received from [facility]    Hunger Vital Sign     Within the past 12 months, you worried that your food would run out before you got the money to buy more.: Patient declined     Within the past 12 months, the food you bought just didn't last and you didn't have money to get more.: Patient declined   Transportation Needs: Patient Declined (05/15/2024)    Received from [facility] - Transportation     In the past  12 months, has lack of reliable transportation kept you from medical appointments, meetings, work or from getting things needed for daily living?: Patient declined   Stress: Stress Concern Present (10/21/2022)    Received from [facility] - Occupational Stress Questionnaire     Feeling of Stress : To some extent   Social Connections: Patient Declined (05/15/2024)    Received from [facility] - Social Connections     If for  any reason you need help with day-to-day activities such as bathing, preparing meals, shopping, managing finances, etc., do you get the help you need?: Patient declined     How often do you feel lonely or isolated from those around you?: Patient declined   Intimate Partner Violence: Not At Risk (10/21/2022)    Received from [facility]    HITS     Over the last 12 months how often did your partner physically hurt you?: Never     Over the last 12 months how often did your partner insult you or talk down to you?: Never     Over the last 12 months how often did your partner threaten you with physical harm?: Never     Over the last 12 months how often did your partner scream or curse at you?: Never   Housing Stability: Patient Declined (05/15/2024)    Received from [facility] - Inadequate Housing     What is your living situation today?: Patient declined     Think about the place you live. Do you have problems with any of the following?: Patient declined        Previous Medications    ALBUTEROL  SULFATE HFA (PROVENTIL ;VENTOLIN ;PROAIR ) 108 (90 BASE) MCG/ACT INHALER    Inhale 2 puffs into the lungs every 4 hours as needed for Shortness of Breath    ALLOPURINOL (ZYLOPRIM) 300 MG TABLET    Take 1 tablet by mouth    APIXABAN  (ELIQUIS ) 2.5 MG TABS TABLET    Take 1 tablet by mouth 2 times daily    ASCORBIC ACID (VITAMIN C) 500 MG TABLET    Take 1 tablet by mouth daily    ATROPINE  1 % OPHTHALMIC SOLUTION    Apply 1 drop to eye 2 times daily    BRIMONIDINE  (ALPHAGAN  P) 0.15 % OPHTHALMIC SOLUTION    Apply 1 drop to eye 3 times daily    CARBOXYMETHYLCELLULOSE PF (REFRESH PLUS) 0.5 % SOLN OPHTHALMIC SOLUTION    Apply 1 drop to eye 4 times daily as needed    FLUTICASONE -SALMETEROL (WIXELA INHUB ) 250-50 MCG/ACT AEPB DISKUS INHALER    Inhale 1 puff into the lungs in the morning and 1 puff in the evening.    FUROSEMIDE  (LASIX ) 20 MG TABLET    Take 2 tablets by mouth daily    IPRATROPIUM 0.5 MG-ALBUTEROL  2.5 MG (DUONEB )  0.5-2.5 (3) MG/3ML SOLN NEBULIZER SOLUTION    Inhale 3 mLs into the lungs 4 times daily as needed for Shortness of Breath or Wheezing Diagnosis J44.9    LACTOBACILLUS (CULTURELLE) CAPS CAPSULE    Take 1 capsule by mouth daily    LATANOPROST (XALATAN) 0.005 % OPHTHALMIC SOLUTION    Apply 1 drop to eye    LIDOCAINE  (LIDODERM ) 5 %    Place 1 patch onto the skin daily    METOLAZONE  (ZAROXOLYN ) 2.5 MG TABLET    Take 1 tablet by mouth every 72 hours  METOPROLOL  SUCCINATE (TOPROL  XL) 25 MG EXTENDED RELEASE TABLET    Take by mouth    MULTIPLE VITAMIN (MULTIVITAMIN) TABLET    Take 1 tablet by mouth daily    PREDNISOLONE  ACETATE (PRED FORTE ) 1 % OPHTHALMIC SUSPENSION    1 drop 2 times daily    ROSUVASTATIN  (CRESTOR ) 10 MG TABLET    3 tablets daily    TIOTROPIUM BROMIDE  (SPIRIVA RESPIMAT) 2.5 MCG/ACT AERS INHALER    Inhale 2 puffs into the lungs daily    TIOTROPIUM-OLODATEROL (STIOLTO) 2.5-2.5 MCG/ACT AERS    Inhale 2 puffs into the lungs daily    VITAMIN D (VITAMIN D-1000 MAX ST) 25 MCG (1000 UT) TABS TABLET    Take 1 tablet by mouth daily        Results from this emergency department visit:      No results found for any visits on 06/19/24.      No orders to display                No results for input(s): COVID19 in the last 72 hours.     Voice dictation software was used during the making of this note.  This software is not perfect and grammatical and other typographical errors may be present.  This note has not been completely proofread for errors.     Joaquim Millard GORMAN DOUGLAS, MD  06/19/24 1057

## 2024-06-24 LAB — CULTURE, BLOOD 1
Culture: NO GROWTH
Culture: NO GROWTH
